# Patient Record
Sex: Female | Born: 1980 | Race: White | Hispanic: No | Marital: Married | Smoking: Never smoker
Health system: Southern US, Community
[De-identification: ages and names within clinical notes are randomized; demographics above are authoritative.]

## PROBLEM LIST (undated history)

## (undated) DIAGNOSIS — R1310 Dysphagia, unspecified: Secondary | ICD-10-CM

## (undated) DIAGNOSIS — I4510 Unspecified right bundle-branch block: Secondary | ICD-10-CM

## (undated) DIAGNOSIS — F988 Other specified behavioral and emotional disorders with onset usually occurring in childhood and adolescence: Secondary | ICD-10-CM

## (undated) DIAGNOSIS — J310 Chronic rhinitis: Secondary | ICD-10-CM

## (undated) DIAGNOSIS — E669 Obesity, unspecified: Secondary | ICD-10-CM

## (undated) DIAGNOSIS — Z9889 Other specified postprocedural states: Secondary | ICD-10-CM

## (undated) DIAGNOSIS — E7800 Pure hypercholesterolemia, unspecified: Secondary | ICD-10-CM

## (undated) DIAGNOSIS — R0602 Shortness of breath: Secondary | ICD-10-CM

## (undated) DIAGNOSIS — G35 Multiple sclerosis: Secondary | ICD-10-CM

## (undated) DIAGNOSIS — G43909 Migraine, unspecified, not intractable, without status migrainosus: Secondary | ICD-10-CM

## (undated) DIAGNOSIS — T7840XA Allergy, unspecified, initial encounter: Secondary | ICD-10-CM

## (undated) DIAGNOSIS — F329 Major depressive disorder, single episode, unspecified: Secondary | ICD-10-CM

## (undated) DIAGNOSIS — M4800 Spinal stenosis, site unspecified: Secondary | ICD-10-CM

## (undated) DIAGNOSIS — F419 Anxiety disorder, unspecified: Secondary | ICD-10-CM

## (undated) DIAGNOSIS — M21371 Foot drop, right foot: Secondary | ICD-10-CM

## (undated) DIAGNOSIS — G35D Multiple sclerosis, unspecified: Secondary | ICD-10-CM

## (undated) DIAGNOSIS — E559 Vitamin D deficiency, unspecified: Secondary | ICD-10-CM

## (undated) DIAGNOSIS — F411 Generalized anxiety disorder: Secondary | ICD-10-CM

## (undated) DIAGNOSIS — K8050 Calculus of bile duct without cholangitis or cholecystitis without obstruction: Secondary | ICD-10-CM

## (undated) DIAGNOSIS — M549 Dorsalgia, unspecified: Secondary | ICD-10-CM

## (undated) DIAGNOSIS — K219 Gastro-esophageal reflux disease without esophagitis: Secondary | ICD-10-CM

## (undated) DIAGNOSIS — R600 Localized edema: Secondary | ICD-10-CM

## (undated) DIAGNOSIS — F32A Depression, unspecified: Secondary | ICD-10-CM

## (undated) DIAGNOSIS — R112 Nausea with vomiting, unspecified: Secondary | ICD-10-CM

## (undated) DIAGNOSIS — G4700 Insomnia, unspecified: Secondary | ICD-10-CM

## (undated) HISTORY — DX: Migraine, unspecified, not intractable, without status migrainosus: G43.909

## (undated) HISTORY — PX: NO PAST SURGERIES: SHX2092

## (undated) HISTORY — DX: Localized edema: R60.0

## (undated) HISTORY — DX: Dysphagia, unspecified: R13.10

## (undated) HISTORY — DX: Spinal stenosis, site unspecified: M48.00

## (undated) HISTORY — DX: Shortness of breath: R06.02

## (undated) HISTORY — DX: Dorsalgia, unspecified: M54.9

## (undated) HISTORY — DX: Insomnia, unspecified: G47.00

## (undated) HISTORY — DX: Chronic rhinitis: J31.0

## (undated) HISTORY — PX: WISDOM TOOTH EXTRACTION: SHX21

## (undated) HISTORY — DX: Anxiety disorder, unspecified: F41.9

## (undated) HISTORY — DX: Allergy, unspecified, initial encounter: T78.40XA

## (undated) HISTORY — DX: Other specified behavioral and emotional disorders with onset usually occurring in childhood and adolescence: F98.8

## (undated) HISTORY — DX: Depression, unspecified: F32.A

## (undated) HISTORY — PX: CHOLECYSTECTOMY: SHX55

## (undated) HISTORY — DX: Generalized anxiety disorder: F41.1

## (undated) HISTORY — DX: Multiple sclerosis: G35

## (undated) HISTORY — DX: Major depressive disorder, single episode, unspecified: F32.9

---

## 2004-04-16 ENCOUNTER — Observation Stay: Payer: Self-pay | Admitting: Unknown Physician Specialty

## 2004-05-20 ENCOUNTER — Inpatient Hospital Stay: Payer: Self-pay | Admitting: Unknown Physician Specialty

## 2007-09-06 ENCOUNTER — Observation Stay: Payer: Self-pay | Admitting: Unknown Physician Specialty

## 2008-01-14 ENCOUNTER — Inpatient Hospital Stay: Payer: Self-pay

## 2009-08-04 ENCOUNTER — Inpatient Hospital Stay: Payer: Self-pay | Admitting: *Deleted

## 2009-08-13 ENCOUNTER — Ambulatory Visit: Payer: Self-pay | Admitting: Neurology

## 2013-07-23 LAB — CBC WITH DIFFERENTIAL/PLATELET
BASOS PCT: 0.6 %
Basophil #: 0 10*3/uL (ref 0.0–0.1)
EOS ABS: 0.2 10*3/uL (ref 0.0–0.7)
EOS PCT: 3.8 %
HCT: 42.3 % (ref 35.0–47.0)
HGB: 14.1 g/dL (ref 12.0–16.0)
Lymphocyte #: 2 10*3/uL (ref 1.0–3.6)
Lymphocyte %: 34.9 %
MCH: 29.5 pg (ref 26.0–34.0)
MCHC: 33.2 g/dL (ref 32.0–36.0)
MCV: 89 fL (ref 80–100)
Monocyte #: 0.6 x10 3/mm (ref 0.2–0.9)
Monocyte %: 9.9 %
Neutrophil #: 2.9 10*3/uL (ref 1.4–6.5)
Neutrophil %: 50.8 %
Platelet: 255 10*3/uL (ref 150–440)
RBC: 4.76 10*6/uL (ref 3.80–5.20)
RDW: 13.1 % (ref 11.5–14.5)
WBC: 5.8 10*3/uL (ref 3.6–11.0)

## 2013-07-23 LAB — COMPREHENSIVE METABOLIC PANEL
ALT: 22 U/L (ref 12–78)
AST: 15 U/L (ref 15–37)
Albumin: 4 g/dL (ref 3.4–5.0)
Alkaline Phosphatase: 55 U/L
Anion Gap: 6 — ABNORMAL LOW (ref 7–16)
BILIRUBIN TOTAL: 0.3 mg/dL (ref 0.2–1.0)
BUN: 15 mg/dL (ref 7–18)
CREATININE: 0.86 mg/dL (ref 0.60–1.30)
Calcium, Total: 8.5 mg/dL (ref 8.5–10.1)
Chloride: 105 mmol/L (ref 98–107)
Co2: 26 mmol/L (ref 21–32)
EGFR (African American): >60
EGFR (Non-African Amer.): >60
Glucose: 89 mg/dL (ref 65–99)
Osmolality: 274 (ref 275–301)
POTASSIUM: 3.5 mmol/L (ref 3.5–5.1)
SODIUM: 137 mmol/L (ref 136–145)
Total Protein: 7.7 g/dL (ref 6.4–8.2)

## 2013-07-23 LAB — LIPASE, BLOOD: LIPASE: 250 U/L (ref 73–393)

## 2013-07-23 LAB — MAGNESIUM: Magnesium: 2 mg/dL

## 2013-07-24 ENCOUNTER — Ambulatory Visit: Payer: Self-pay | Admitting: Neurology

## 2013-07-24 ENCOUNTER — Inpatient Hospital Stay: Payer: Self-pay | Admitting: Student

## 2013-07-24 LAB — HCG, QUANTITATIVE, PREGNANCY

## 2013-07-25 LAB — CBC WITH DIFFERENTIAL/PLATELET
BASOS ABS: 0 10*3/uL (ref 0.0–0.1)
Basophil %: 0.1 %
EOS ABS: 0 10*3/uL (ref 0.0–0.7)
EOS PCT: 0 %
HCT: 39.6 % (ref 35.0–47.0)
HGB: 13.5 g/dL (ref 12.0–16.0)
Lymphocyte #: 1.4 10*3/uL (ref 1.0–3.6)
Lymphocyte %: 15.9 %
MCH: 30 pg (ref 26.0–34.0)
MCHC: 34.1 g/dL (ref 32.0–36.0)
MCV: 88 fL (ref 80–100)
Monocyte #: 0.8 x10 3/mm (ref 0.2–0.9)
Monocyte %: 8.5 %
NEUTROS PCT: 75.5 %
Neutrophil #: 6.9 10*3/uL — ABNORMAL HIGH (ref 1.4–6.5)
PLATELETS: 256 10*3/uL (ref 150–440)
RBC: 4.5 10*6/uL (ref 3.80–5.20)
RDW: 13 % (ref 11.5–14.5)
WBC: 9.1 10*3/uL (ref 3.6–11.0)

## 2013-07-25 LAB — COMPREHENSIVE METABOLIC PANEL
ALBUMIN: 3.3 g/dL — AB (ref 3.4–5.0)
ALK PHOS: 49 U/L
ALT: 18 U/L (ref 12–78)
ANION GAP: 9 (ref 7–16)
AST: 20 U/L (ref 15–37)
BUN: 11 mg/dL (ref 7–18)
Bilirubin,Total: 0.4 mg/dL (ref 0.2–1.0)
CALCIUM: 8.5 mg/dL (ref 8.5–10.1)
Chloride: 108 mmol/L — ABNORMAL HIGH (ref 98–107)
Co2: 23 mmol/L (ref 21–32)
Creatinine: 0.72 mg/dL (ref 0.60–1.30)
EGFR (African American): >60
EGFR (Non-African Amer.): >60
Glucose: 105 mg/dL — ABNORMAL HIGH (ref 65–99)
Osmolality: 279 (ref 275–301)
Potassium: 4.2 mmol/L (ref 3.5–5.1)
SODIUM: 140 mmol/L (ref 136–145)
Total Protein: 6.7 g/dL (ref 6.4–8.2)

## 2013-07-25 LAB — CSF CELL CT + PROT + GLU PANEL
CSF TUBE #: 3
EOS PCT: 0 %
GLUCOSE, CSF: 65 mg/dL (ref 40–75)
Lymphocytes: 94 %
MONOCYTES/MACROPHAGES: 2 %
Neutrophils: 5 %
Other Cells: 0 %
Protein, CSF: 23 mg/dL (ref 15–45)
RBC (CSF): 0 /mm3
WBC (CSF): 12 /mm3

## 2013-07-28 LAB — BASIC METABOLIC PANEL
Anion Gap: 9 (ref 7–16)
BUN: 16 mg/dL (ref 7–18)
CALCIUM: 8.6 mg/dL (ref 8.5–10.1)
CHLORIDE: 103 mmol/L (ref 98–107)
CREATININE: 0.66 mg/dL (ref 0.60–1.30)
Co2: 29 mmol/L (ref 21–32)
EGFR (African American): >60
GLUCOSE: 121 mg/dL — AB (ref 65–99)
Osmolality: 284 (ref 275–301)
Potassium: 3.7 mmol/L (ref 3.5–5.1)
Sodium: 141 mmol/L (ref 136–145)

## 2013-07-28 LAB — CBC WITH DIFFERENTIAL/PLATELET
Basophil #: 0 10*3/uL (ref 0.0–0.1)
Basophil %: 0.1 %
EOS ABS: 0 10*3/uL (ref 0.0–0.7)
EOS PCT: 0 %
HCT: 40.1 % (ref 35.0–47.0)
HGB: 13.8 g/dL (ref 12.0–16.0)
LYMPHS PCT: 9.5 %
Lymphocyte #: 1 10*3/uL (ref 1.0–3.6)
MCH: 30.1 pg (ref 26.0–34.0)
MCHC: 34.3 g/dL (ref 32.0–36.0)
MCV: 88 fL (ref 80–100)
MONO ABS: 0.3 x10 3/mm (ref 0.2–0.9)
Monocyte %: 2.5 %
NEUTROS ABS: 9.3 10*3/uL — AB (ref 1.4–6.5)
NEUTROS PCT: 87.9 %
Platelet: 292 10*3/uL (ref 150–440)
RBC: 4.57 10*6/uL (ref 3.80–5.20)
RDW: 13.4 % (ref 11.5–14.5)
WBC: 10.6 10*3/uL (ref 3.6–11.0)

## 2014-05-28 NOTE — Consult Note (Signed)
PATIENT NAMEANDREAH, Ballard MR#:  [id number] DATE OF BIRTH:  March 31, 1980  DATE OF CONSULTATION:  07/24/2013  REFERRING PHYSICIAN:  Dr. Lenard Lance in the Emergency Room. CONSULTING PHYSICIAN:  Weston Settle, MD  REASON FOR CONSULTATION: Brain lesion.   HISTORY OF PRESENT ILLNESS: The patient is a 34 year old white female who claims a 1-2  day history of worsening bilateral lower extremity numbness, tingling, and weakness. She does admit to being out in the extreme heat for prolonged periods of time as well during this time, which may have exacerbated her condition. She does have some mild  bladder issues in terms of  incomplete emptying and some difficulty with feeling that she has to go. Back in 2011, she presented with bilateral  lower extremity numbness and tingling and was found to have a cervical spine lesion at C4-C5, and did see a neurologist who elected  to observe her and no further work-up was done.  An MRI of the brain was performed with and without contrast, which I reviewed personally. It indicates a right frontal subcortical lesion which does not enhance with contrast. On retrospect, this was present in 2011 as well; however, there is a new lesion now in the right cerebellar peduncle which does not enhance with contrast either, and is new versus 2011. No other lesions are identified in the brain including no other corpus callosal lesions.  MRI of the cervical spine, with and without contrast, was also performed. She did move a lot and the quality of films are very poor. I do see some remnants of the old lesion at C4-C5, and it appears to be in the left posterolateral location on axial imaging. It does not enhance with contrast and it is less visible versus 2011. I cannot comment on any other possible lesions there because of poor quality of the images. The MRI of the lumbosacral spine was also performed, which I reviewed. It indicates degenerative disk disease at L5-S1 and there is evidence  of bilateral severe neural foraminal stenosis at L4-L5 as well. The thoracic spine was not imaged. CBC and complete metabolic panel are normal. She has not had any recent illness, SOB, UTI,cough, runny nose, or fever.  PAST MEDICAL HISTORY: Environmental allergies.   PAST SURGICAL HISTORY: Negative.  MEDICATIONS AT HOME: Claritin 10 mg daily.   ALLERGIES: NO KNOWN DRUG ALLERGIES.   SOCIAL HISTORY: Denies smoking, alcohol, or illicit drugs.   FAMILY HISTORY: Positive for MS in her mother. She is on Avonex per patient.  REVIEW OF SYSTEMS: Some mild disturbance in her balance with some mild numbness and tingling intermittently in the left hand, which has been present since 2011. No optic neuritis. No diplopia. No facial numbness. No vertigo.   PHYSICAL EXAMINATION:  VITAL SIGNS: Blood pressure is 117/73, pulse of 88, temperature 98.3.  HEART: Regular rate and rhythm, S1, S2. No murmurs.  LUNGS: Clear to auscultation.  NECK: There are no carotid bruits.  NEUROLOGIC: She is awake and alert. Language is fluent. Comprehension, naming and repetition are intact. Pupils are equal and reactive. There is no afferent pupillary defect. Extraocular movements are intact. Face is symmetrical. Tongue is midline. Palate raises symmetrically. Strength testing shows 5/5 strength in the upper and lower extremities in all muscle groups. There is no Babinski sign present. There is no Hoffman sign present. There are no skin rashes present. Sensory testing reveals reduced pinprick sensation from the toes all the way to the umbilicus bilaterally in the front and back of  the trunk. Coordination with finger-to-nose and heel-to-shin is intact and symmetrical. Reflexes are +2 and symmetrical in the Achilles, patella, biceps, triceps, and brachial radialis bilaterally. Gait testing is normal including her tandem gait. There is a mildly positive Romberg sign present.   IMPRESSION AND PLAN: This patient presents with at least  2 lesions disseminated in space and time, which are very typical of what you would find in a demyelinating disease such as multiple sclerosis. I think she does fulfill the criteria. I think her current clinical presentation is suggestive of a T 10 sensory level and she may have a new lower thoracic spinal cord lesion that was not imaged. On the other hand, her symptoms could simply be exacerbations of her prior symptoms from her cervical spine lesion due to the recent heat wave, otherwise known as the [place] phenomenon. I do think that even though technically she fulfills the diagnostic criteria, that a lumbar puncture with CSF oligoclonal bands and IgG index may help [place] solidify the diagnosis most accurately early on. She has received a low dose of Solu-Medrol in the ER at 125 mg. I do recommend we continue that at 1000 mg per day for the next 2 days. I discussed briefly with her future  immunomodulatory  treatment of her multiple sclerosis, and we discussed the various options including injectables and oral medications and those discussions can continue through the outpatient process before she makes a final decision on her personal preference for modality of treatment.     ____________________________ Weston Settle, MD se:ts D: 07/24/2013 13:21:21 ET T: 07/24/2013 14:14:15 ET JOB#: [id number]  cc: Weston Settle, MD, <Dictator> Weston Settle MD ELECTRONICALLY SIGNED 07/24/2013 16:17

## 2014-05-28 NOTE — H&P (Signed)
PATIENT NAMEDOLCE, [name] MR#:  [id number] DATE OF BIRTH:  1980-11-16  DATE OF ADMISSION:  07/24/2013  REFERRING PHYSICIAN: Dr. Lenard Lance.   FAMILY PHYSICIAN: Dr. Terance Hart.   REASON FOR ADMISSION: Exacerbation of multiple sclerosis.   HISTORY OF PRESENT ILLNESS: The patient is a 34 year old female admitted here approximately 4 years ago with diagnosis of MS for which she received IV steroids. Has been well since then until recently when she had developed progressive numbness and weakness in her pelvis and legs bilaterally. Presents to Emergency Room where MRI showed progressive lesions consistent with worsening MS. She is now admitted for further evaluation.   PAST MEDICAL HISTORY:  1.  Multiple sclerosis.  2.  Environmental allergies.   MEDICATIONS: None.   ALLERGIES: NO KNOWN DRUG ALLERGIES.   SOCIAL HISTORY: Negative for alcohol or tobacco abuse.   FAMILY HISTORY: Negative for breast or colon cancer. Essentially unremarkable.  REVIEW OF SYSTEMS:  CONSTITUTIONAL: No fever or change in weight.  EYES: Some blurred vision. No glaucoma.  ENT: No tinnitus or hearing loss. No nasal discharge or bleeding. No difficulty swallowing.  RESPIRATORY: No cough or wheezing. Denies hemoptysis.  CARDIOVASCULAR: No chest pain or orthopnea. No palpitations or syncope.  GASTROINTESTINAL: No nausea, vomiting, or diarrhea. No abdominal pain.  GENITOURINARY: No dysuria or hematuria. No incontinence.  ENDOCRINE: No polyuria or polydipsia. No heat or cold intolerance.  HEMATOLOGIC: The patient denies anemia, easy bruising, or bleeding.  LYMPHATIC: No swollen glands.  MUSCULOSKELETAL: The patient denies pain in her neck, back, shoulders, knees, or hips. No gout.  NEUROLOGIC: Denies migraines or stroke. No seizures.  PSYCHIATRIC: The patient denies anxiety, insomnia or depression.   PHYSICAL EXAMINATION:  GENERAL: The patient is in no acute distress.  VITAL SIGNS: Remarkable for a blood pressure  of 126/61, with a heart rate in 95, respiratory rate of 18, temperature of 98.3, and saturations 98% on room air.  HEENT: Normocephalic, atraumatic. Pupils equal, round, and reactive to light and accommodation. Extraocular movements are intact. Sclerae are nonicteric. Conjunctivae are clear.  Oropharynx is clear.  NECK: Supple without JVD. No adenopathy or thyromegaly is noted.  LUNGS: Clear to auscultation and percussion without wheezes, rales, or rhonchi. No dullness. Respiratory effort is normal.  CARDIAC: Regular rate and rhythm. Normal S1, S2. No significant rubs, murmurs, or gallops. PMI is nondisplaced. Chest wall is nontender.  ABDOMEN: Soft and nontender with normoactive bowel sounds. No organomegaly or masses were appreciated. No hernias or bruits are noted.  EXTREMITIES: Without clubbing, cyanosis, or edema. Pulses are 2+ bilaterally.  NEUROLOGIC: Cranial nerves II-XII grossly intact. Deep tendon reflexes are symmetric. Motor exam revealed 4/5 strength in the lower extremities. Sensory exam nonfocal.  PSYCHIATRIC: Revealed a patient who is alert and oriented to person, place, and time. She is cooperative and uses good judgment.   LABORATORY DATA: Glucose is 89 with a BUN of 15, creatinine of 0.86 and a GFR of greater than 60. Lipase is 250. Magnesium 2.0. White count is 5.8 with a hemoglobin of 14.1. MRI of the brain revealed new lesions in the right brachium pontis, as well as some spinal cord lesions in her cervical spine.   ASSESSMENT:  1.  Progressive multiple sclerosis.  2.  Failure to thrive.  3.  Lower extremity weakness.  4.  Visual disturbance.  5.  Environmental allergies.   PLAN: The patient will be admitted the floor with high-dose IV steroids. We will follow her sugars while on steroids. We would  begin empiric antibiotics. We will consult neurology. We will obtain an MRI of the thoracic spine per neurology recommendations. Consult physical therapy and speech therapy. Care  management consult for discharge planning. Continue IV steroids per neurology. Follow up routine labs in the morning. Further treatment and evaluation will depend upon the patient's progress.   TOTAL TIME SPENT ON THIS PATIENT: 50 minutes.   ____________________________ Duane Lope Kristen Sheen, MD jds:ts D: 07/24/2013 14:26:17 ET T: 07/24/2013 14:55:55 ET JOB#: [id number]  cc: Duane Lope. Kristen Sheen, MD, <Dictator> Teena Irani. Terance Hart, MD JEFFREY Rodena Medin MD ELECTRONICALLY SIGNED 07/24/2013 18:23

## 2014-05-28 NOTE — Discharge Summary (Signed)
PATIENT NAMEVICTORIYA, Ballard MR#:  [id number] DATE OF BIRTH:  [birth year]  DATE OF ADMISSION:  07/24/2013 DATE OF DISCHARGE:  07/28/2013  CONSULTANTS: Dr. Weston Settle from neurology and speech therapy.   CHIEF COMPLAINT: Exacerbation of multiple sclerosis.   DISCHARGE DIAGNOSES:  1. Acute multiple sclerosis flare.  2. History of environmental allergies.   DISCHARGE MEDICATIONS: Claritin-D 12 hour 5/120 mg 1 tablet every 12 hours, prednisone 70 mg today and then taper by 10 mg until done in 7 days.   DIET: Regular.   ACTIVITY: As tolerated.   FOLLOWUP: Please follow with PCP today at 1 as previously scheduled. Please follow with Dr. Sherryll Burger, neurology, on Friday as scheduled.   DISPOSITION: Home.   SIGNIFICANT LABORATORIES AND IMAGING: Initial BUN of 15, creatinine of 0.86, white count of 5.8. CSF cultures showed 94 lymphocytes, 12 WBC, 65 glucose, and 23 protein. ACE within normal limits. ANA with reflex was negative. Multiple sclerosis profile with myelin basic protein from the CSF.  Blood serum showed CSF. IgG index elevated at 1.  MRI lumbar spine with and without contrast: L4-L5, there is mild broad based disk bulge. At L5-S1, there is grade 1 anterolisthesis of L5 on S1, severe bilateral foraminal stenosis. MRI brain with and without contrast on June 20th, findings consistent with multiple sclerosis.  New lesion in the right brachium pontis, spinal cord lesion on C4-C5 was also present in 2011.  C-spine evaluation is limited due to motion. MRI C-spine with and without contrast as above. MRI T-spine: Normal MRI of the thoracic spine.   HISTORY OF PRESENT ILLNESS AND HOSPITAL COURSE: For full details of H and P, please see the dictation on the day of admission by Dr. Judithann Sheen. Briefly, this is a pleasant 34 year old Caucasian female with a history of multiple sclerosis diagnosed about 4 years ago, for which she received IV steroids.  Has been well since then. Came in with progressive  weakness in pelvis and legs bilaterally. In ER, had MRI which showed progressive lesions consistent with worsening MS and, therefore, she was admitted to the hospitalist service. She was seen by neurology and was also started on high dose steroids. She, of note, had a lumbar puncture done. CSF fluid as discussed above. Per neurology, her history and lesions are consistent with multiple sclerosis.  Her lesions are at the right frontal, right cerebellar peduncle at C4-5 and C6-7. Also, per history, paresthesias. She also is suggestive of having a T10 lesion, which is not seen on the scan. She has completed 3 days of high-dose steroids and she will be discharged with a prednisone taper per my previous colleague's discussion with Dr. Sherryll Burger from neurology. Of note, patient has an appointment with the PCP later today and with Dr. Sherryll Burger on Friday. At this point, the patient has just mild numbness and paresthesia, mostly below the knees and mostly at the ankles.   PHYSICAL EXAMINATION: VITAL SIGNS:  On the day of discharge, temperature is 98.4, pulse rate is 89, respiratory rate 17, blood pressure 109/68, oxygen saturation 99%.  GENERAL: The patient is a well-developed female lying in bed, no obvious distress.  HEENT: Normocephalic, atraumatic. Moist mucous membranes.  NECK:  Supple.  CARDIOVASCULAR: S1, S2 regular. No significant murmurs.  LUNGS: Clear.  ABDOMEN: Soft, nontender.  EXTREMITIES: No pitting edema.   TOTAL TIME SPENT:  About 35 minutes.    ____________________________ Kristen Eaton, MD sa:dd D: 07/28/2013 11:22:00 ET T: 07/28/2013 20:53:31 ET JOB#: [id number]  cc: Kristen Jacques Navy,  MD, <Dictator> Dr. Bailey Mech [facility] MD ELECTRONICALLY SIGNED 08/03/2013 18:18

## 2014-09-01 DIAGNOSIS — G35 Multiple sclerosis: Secondary | ICD-10-CM | POA: Insufficient documentation

## 2014-09-21 ENCOUNTER — Ambulatory Visit: Payer: Self-pay | Admitting: Family Medicine

## 2015-04-04 ENCOUNTER — Other Ambulatory Visit: Payer: Self-pay | Admitting: Neurology

## 2015-04-04 DIAGNOSIS — G35 Multiple sclerosis: Secondary | ICD-10-CM

## 2015-04-24 ENCOUNTER — Ambulatory Visit: Payer: BLUE CROSS/BLUE SHIELD

## 2015-04-24 ENCOUNTER — Ambulatory Visit: Admission: RE | Admit: 2015-04-24 | Payer: BLUE CROSS/BLUE SHIELD | Source: Ambulatory Visit

## 2015-05-11 ENCOUNTER — Ambulatory Visit: Payer: BLUE CROSS/BLUE SHIELD

## 2015-05-11 ENCOUNTER — Ambulatory Visit: Admission: RE | Admit: 2015-05-11 | Payer: BLUE CROSS/BLUE SHIELD | Source: Ambulatory Visit

## 2015-05-25 ENCOUNTER — Ambulatory Visit (INDEPENDENT_AMBULATORY_CARE_PROVIDER_SITE_OTHER): Payer: BLUE CROSS/BLUE SHIELD | Admitting: Neurology

## 2015-05-25 ENCOUNTER — Encounter: Payer: Self-pay | Admitting: Neurology

## 2015-05-25 VITALS — BP 120/82 | HR 72 | Resp 14 | Ht 62.5 in | Wt 154.0 lb

## 2015-05-25 DIAGNOSIS — R5383 Other fatigue: Secondary | ICD-10-CM | POA: Insufficient documentation

## 2015-05-25 DIAGNOSIS — F418 Other specified anxiety disorders: Secondary | ICD-10-CM | POA: Insufficient documentation

## 2015-05-25 DIAGNOSIS — R269 Unspecified abnormalities of gait and mobility: Secondary | ICD-10-CM | POA: Diagnosis not present

## 2015-05-25 DIAGNOSIS — Z87898 Personal history of other specified conditions: Secondary | ICD-10-CM | POA: Insufficient documentation

## 2015-05-25 DIAGNOSIS — R350 Frequency of micturition: Secondary | ICD-10-CM

## 2015-05-25 DIAGNOSIS — Z79899 Other long term (current) drug therapy: Secondary | ICD-10-CM | POA: Insufficient documentation

## 2015-05-25 DIAGNOSIS — G35 Multiple sclerosis: Secondary | ICD-10-CM | POA: Diagnosis not present

## 2015-05-25 DIAGNOSIS — J302 Other seasonal allergic rhinitis: Secondary | ICD-10-CM | POA: Insufficient documentation

## 2015-05-25 DIAGNOSIS — G43909 Migraine, unspecified, not intractable, without status migrainosus: Secondary | ICD-10-CM | POA: Insufficient documentation

## 2015-05-25 DIAGNOSIS — Z8669 Personal history of other diseases of the nervous system and sense organs: Secondary | ICD-10-CM | POA: Insufficient documentation

## 2015-05-25 MED ORDER — OXYBUTYNIN CHLORIDE ER 10 MG PO TB24
10.0000 mg | ORAL_TABLET | Freq: Every day | ORAL | Status: DC
Start: 2015-05-25 — End: 2015-11-15

## 2015-05-25 MED ORDER — SERTRALINE HCL 50 MG PO TABS: 50.0000 mg | ORAL_TABLET | Freq: Every day | ORAL | Status: DC

## 2015-05-25 NOTE — Progress Notes (Signed)
[facility]  PATIENT: KARL KNARR DOB: [birth year]  REFERRING DOCTOR OR PCP:  Dr. Cristopher Peru  [facility]) SOURCE: Patient, a couple MRI reports and images on PACS. Lab reports, notes from Dr. Clelia Croft  _________________________________   HISTORICAL  CHIEF COMPLAINT:  Chief Complaint  Patient presents with  . Multiple Sclerosis    Manasi is here for eval of MS.  Sts. she was dx. 2 yrs. ago.  Presenting sx. was numbness from her waist down and pain in her legs.  Dx. confirmed with MRI and LP.  She saw Dr. Sherryll Burger and he started her on Tecfidera, which she  has tolerated well.  Over the last 4-5 mos, she c/o worsening focus/concentration, more forgetful, difficulty swallowing. MRI done in March shows new lesions.  Dr. Sherryll Burger wanted her to start Tysabri but sts. she is high JCV ab positive. She is  here for a 2nd opinion regarding    . Abnormal MRI    tx. options/fim    HISTORY OF PRESENT ILLNESS:  I had the pleasure of seeing your patient, Kalliopi Coupland, at [facility] for neurologic consultation regarding her Multiple sclerosis and new symptoms.       MS history: In late June or early July 2011, she had the onset of numbness that went up to her belly button. She presented to the emergency room. An MRI of the brain showed 1 periventricular focus with maybe a second subtle periventricular focus. An MRI of the cervical spine showed an enhancing lesion adjacent to C3-C4. He diagnosis of possible MS was made but the evidence was not enough to begin a disease modifying therapy. In 2015, she had the onset of similar symptoms and also had decreased ability to use the left hand. An MRI of the brain at that time showed several new lesions not present in 2011, including a focus in the right middle cerebellar peduncle additionally, she had a lumbar puncture consistent with MS. Because of her symptoms, MRI changes and lumbar puncture results, she was diagnosed with  clinical definite relapsing remitting MS and was started on Tecfidera. She has continued on Tecfidera. Over the past few months she has noted more difficulty with focus and concentration.     She also has had some change in vision more recently. Because of these newer symptoms, and MRI was repeated 04/27/2015. The MRI of the cervical spine looked showed no new lesions. though the MRI of the knee showed 2  lesions not present in 2015, one in the left juxtacortical white matter and one in the left deep white matter.    Gait/strength/sensation: She feels her gait is fine on a flat surface. She feels comfortable climbing stairs but feels unsteady when she is going downstairs. Sometimes she will feel a little off balance while walking. She denies any significant weakness. She denies numbness most of the time. However, when she exercises for longer periods of time she will note some numbness in her legs.  Bladder/bowel: She has had difficulty with urinary frequency and urgency. She has not had urinary incontinence. Bowel function is fine.  Vision: Over the past 6 months or so she has noted that it takes her longer to fix her vision and she sometimes will get some separation but not frank diplopia..  She denies vertigo.  Fatigue/sleep: She reports fatigue that is both physical and cognitive. She notes that she does not feel refreshed when she wakes up in the morning and that she will feel more  tired as the day goes on. She is able to exercise regularly. Her fatigues a lot worse when she is hot or in hot weather. She feels she falls asleep easily most nights. However, she will wake up several times most nights and often have trouble falling back asleep. Some of those times she wakes up is to use the bathroom but other times she just wakes up spontaneously.   Amantadine has not helped much.     Mood/cognition: She notes a little bit of depression and has some anxiety. She has some stress being a single mother with  3 kids. Youngest child is 7.  She has never been on any antidepressants. She also has noted difficulty with cognitive skills over the past couple of years. 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    I personally reviewed multiple MRIs. MRI of the cervical spine 08/13/2009 shows an enhancing focus posteriorly adjacent to C4-C5. MRI of the brain 08/05/2009 showed several T2/FLAIR hyperintense foci in the periventricular white matter. MRI of the brain 07/24/2013 showed multiple development of a right middle cerebellar peduncle lesion and additional periventricular foci. MRI of the brain 04/27/2015 REVIEW OF SYSTEMS: Constitutional: No fevers, chills, sweats, or change in appetite.  She has fatigue and insomnia. Eyes: No visual changes, double vision, eye pain Ear, nose and throat: No hearing loss, ear pain, nasal congestion, sore throat Cardiovascular: No chest pain, palpitations Respiratory: No shortness of breath at rest or with exertion.   No wheezes GastrointestinaI: No nausea, vomiting, diarrhea, abdominal pain, fecal incontinence Genitourinary: No dysuria, urinary retention.  She notes frequency and nocturia. Musculoskeletal: No neck pain, back pain Integumentary: No rash, pruritus, skin lesions Neurological: as above Psychiatric: as above Endocrine: No palpitations, diaphoresis, change in appetite, change in weigh or increased thirst Hematologic/Lymphatic: No anemia, purpura, petechiae. Allergic/Immunologic: No itchy/runny eyes, nasal congestion, recent allergic reactions, rashes  ALLERGIES: No Known Allergies  HOME MEDICATIONS:  Current outpatient prescriptions:  .  Amantadine HCl 100 MG tablet, , Disp: , Rfl: 0 .  baclofen (LIORESAL) 10 MG tablet, Take by mouth., Disp: , Rfl:  .  Cholecalciferol (VITAMIN D-1000 MAX ST) 1000 units tablet, Take by mouth., Disp: , Rfl:  .   ibuprofen (ADVIL,MOTRIN) 200 MG tablet, Take 200 mg by mouth every 6 (six) hours as needed., Disp: , Rfl:  .  TECFIDERA 240 MG CPDR, , Disp: , Rfl: 10 .  triamcinolone (NASACORT) 55 MCG/ACT AERO nasal inhaler, Place 2 sprays into the nose daily., Disp: , Rfl:   PAST MEDICAL HISTORY: History reviewed. No pertinent past medical history.  PAST SURGICAL HISTORY: History reviewed. No pertinent past surgical history.  FAMILY HISTORY: History reviewed. No pertinent family history.  SOCIAL HISTORY:  Social History   Social History  . Marital Status: Married    Spouse Name: N/A  . Number of Children: N/A  . Years of Education: N/A   Occupational History  . Not on file.   Social History Main Topics  . Smoking status: Never Smoker   . Smokeless tobacco: Not on file  . Alcohol Use: 0.0 oz/week    0 Standard drinks or equivalent per week     Comment: occasional/fim  . Drug Use: No  . Sexual Activity: Not on file   Other Topics Concern  . Not on file   Social History Narrative  . No narrative on file  PHYSICAL EXAM  Filed Vitals:   05/25/15 1507  BP: 120/82  Pulse: 72  Resp: 14  Height: 5' 2.5" (1.588 m)  Weight: 154 lb (69.854 kg)    Body mass index is 27.7 kg/(m^2).   General: The patient is well-developed and well-nourished and in no acute distress  Eyes:  Funduscopic exam shows normal optic discs and retinal vessels.  Neck: The neck is supple, no carotid bruits are noted.  The neck is nontender.  Cardiovascular: The heart has a regular rate and rhythm with a normal S1 and S2. There were no murmurs, gallops or rubs. Lungs are clear to auscultation.  Skin: Extremities are without significant edema.  Musculoskeletal:  Back is nontender  Neurologic Exam  Mental status: The patient is alert and oriented x 3 at the time of the examination. The patient has apparent normal recent and remote memory, with an apparently normal attention span and concentration  ability.   Speech is normal.  Cranial nerves: Extraocular movements are full. Pupils are equal, round, and reactive to light and accomodation.  Visual fields are full.  Facial symmetry is present. There is good facial sensation to soft touch bilaterally.Facial strength is normal.  Trapezius and sternocleidomastoid strength is normal. No dysarthria is noted.  The tongue is midline, and the patient has symmetric elevation of the soft palate. No obvious hearing deficits are noted.  Motor:  Muscle bulk is normal.   Tone is normal. Strength is  5 / 5 in all 4 extremities.   Sensory: Sensory testing is intact to pinprick, soft touch and vibration sensation in all 4 extremities.  Coordination: Cerebellar testing reveals good finger-nose-finger and heel-to-shin bilaterally.  Gait and station: Station is normal.   Gait is near normal. Tandem gait is wide. Romberg is negative.   Reflexes: Deep tendon reflexes are symmetric and increased in her legs with spread at the knees bilaterally.   Plantar responses are flexor.    DIAGNOSTIC DATA (LABS, IMAGING, TESTING) - I reviewed patient records, labs, notes, testing and imaging myself where available.  Lab Results  Component Value Date   WBC 10.6 07/28/2013   HGB 13.8 07/28/2013   HCT 40.1 07/28/2013   MCV 88 07/28/2013   PLT 292 07/28/2013      Component Value Date/Time   NA 141 07/28/2013 0410   K 3.7 07/28/2013 0410   CL 103 07/28/2013 0410   CO2 29 07/28/2013 0410   GLUCOSE 121* 07/28/2013 0410   BUN 16 07/28/2013 0410   CREATININE 0.66 07/28/2013 0410   CALCIUM 8.6 07/28/2013 0410   PROT 6.7 07/25/2013 0410   ALBUMIN 3.3* 07/25/2013 0410   AST 20 07/25/2013 0410   ALT 18 07/25/2013 0410   ALKPHOS 49 07/25/2013 0410   BILITOT 0.4 07/25/2013 0410   GFRNONAA >60 07/28/2013 0410   GFRAA >60 07/28/2013 0410       ASSESSMENT AND PLAN  Relapsing remitting multiple sclerosis (HCC) - Plan: Hepatitis B surface antibody, Hepatitis C  antibody, Quantiferon tb gold assay (blood), Comprehensive metabolic panel, CBC with Differential/Platelet, Hepatitis B surface antigen, Hepatitis B core antibody, total, Varicella zoster antibody, IgG  High risk medication use - Plan: Hepatitis B surface antibody, Hepatitis C antibody, Quantiferon tb gold assay (blood), Comprehensive metabolic panel, CBC with Differential/Platelet, Hepatitis B surface antigen, Hepatitis B core antibody, total, Varicella zoster antibody, IgG  Gait disturbance  Depression with anxiety  Other fatigue  Urinary frequency   In summary, Steffanie Mingle is a 35 year old woman with  multiple sclerosis who was diagnosed in 2015 but had a transverse myelitis exacerbation in 2011. She has been on Tecfidera since 2015 but has had some progression of symptoms over the past 6 months. Additionally, her latest MRI shows 2 small foci are present in 2015.    I discussed with her that she be having a suboptimal response to her current therapy, though he is not clearly failing the therapy. My recommendation would be that she consider a different medication. We spent time discussing Gilenya in ocrelizumab as to options that may offer higher efficacy. Because she is high titer JCV antibody positive, I don't think that Tysabri would be as good of an option. We also briefly discussed [place] and [place] but insurance generally is reserving those 2 medications for people who have failed two other medicines. I went ahead and did blood work in preparation for either of these choices and she will consider her options further over the next couple of days, call back with the results of the blood work we can find out her choice and try to get her scheduled for either her first infusion or for her first day observation.  She has some other symptoms including mood issues, insomnia, urinary frequency and fatigue.  I will place her on Oxybutynin XL and we also discussed a trial of an antidepressant.   If  fatigue and her cognitive fog do not improve on a different therapy, consider a stimulant  She will return to see me in 2 months or sooner to initiate one of the therapies. If you or her would like her to continue to be seen here, I'll be happy to do so.  Thank you for asking me to see Mrs. Nedra Hai for a neurologic consultation. Please let me know if I can be of further assistance with her or other patients in the future.   Avonte Sensabaugh A. Epimenio Foot, MD, PhD 05/25/2015, 3:13 PM Certified in Neurology, Clinical Neurophysiology, Sleep Medicine, Pain Medicine and Neuroimaging  [facility] [address] [place], [place] [address] [phone]

## 2015-05-26 LAB — CBC WITH DIFFERENTIAL/PLATELET
BASOS: 1 %
Basophils Absolute: 0 10*3/uL (ref 0.0–0.2)
EOS (ABSOLUTE): 0.1 10*3/uL (ref 0.0–0.4)
EOS: 2 %
HEMOGLOBIN: 14.1 g/dL (ref 11.1–15.9)
Hematocrit: 40.6 % (ref 34.0–46.6)
IMMATURE GRANS (ABS): 0 10*3/uL (ref 0.0–0.1)
Immature Granulocytes: 0 %
LYMPHS ABS: 1.5 10*3/uL (ref 0.7–3.1)
LYMPHS: 27 %
MCH: 29.7 pg (ref 26.6–33.0)
MCHC: 34.7 g/dL (ref 31.5–35.7)
MCV: 86 fL (ref 79–97)
MONOCYTES: 7 %
Monocytes Absolute: 0.4 10*3/uL (ref 0.1–0.9)
NEUTROS ABS: 3.6 10*3/uL (ref 1.4–7.0)
Neutrophils: 63 %
Platelets: 319 10*3/uL (ref 150–379)
RBC: 4.74 x10E6/uL (ref 3.77–5.28)
RDW: 12.8 % (ref 12.3–15.4)
WBC: 5.6 10*3/uL (ref 3.4–10.8)

## 2015-05-26 LAB — COMPREHENSIVE METABOLIC PANEL
ALBUMIN: 4.4 g/dL (ref 3.5–5.5)
ALT: 11 IU/L (ref 0–32)
AST: 10 IU/L (ref 0–40)
Albumin/Globulin Ratio: 1.6 (ref 1.2–2.2)
Alkaline Phosphatase: 63 IU/L (ref 39–117)
BILIRUBIN TOTAL: 0.4 mg/dL (ref 0.0–1.2)
BUN / CREAT RATIO: 16 (ref 9–23)
BUN: 12 mg/dL (ref 6–20)
CALCIUM: 9 mg/dL (ref 8.7–10.2)
CO2: 26 mmol/L (ref 18–29)
Chloride: 101 mmol/L (ref 96–106)
Creatinine, Ser: 0.76 mg/dL (ref 0.57–1.00)
GFR, EST AFRICAN AMERICAN: 117 mL/min/{1.73_m2} (ref >59)
GFR, EST NON AFRICAN AMERICAN: 101 mL/min/{1.73_m2} (ref >59)
GLUCOSE: 87 mg/dL (ref 65–99)
Globulin, Total: 2.7 g/dL (ref 1.5–4.5)
Potassium: 4.4 mmol/L (ref 3.5–5.2)
Sodium: 139 mmol/L (ref 134–144)
TOTAL PROTEIN: 7.1 g/dL (ref 6.0–8.5)

## 2015-05-26 LAB — HEPATITIS B SURFACE ANTIBODY,QUALITATIVE: Hep B Surface Ab, Qual: NONREACTIVE

## 2015-05-26 LAB — HEPATITIS C ANTIBODY

## 2015-05-26 LAB — VARICELLA ZOSTER ANTIBODY, IGG: VARICELLA: 898 {index} (ref >165)

## 2015-05-26 LAB — HEPATITIS B CORE ANTIBODY, TOTAL: Hep B Core Total Ab: NEGATIVE

## 2015-05-26 LAB — HEPATITIS B SURFACE ANTIGEN: HEP B S AG: NEGATIVE

## 2015-05-29 ENCOUNTER — Telehealth: Payer: Self-pay | Admitting: *Deleted

## 2015-05-29 LAB — QUANTIFERON IN TUBE
QFT TB AG MINUS NIL VALUE: <0 IU/mL
QUANTIFERON MITOGEN VALUE: >10 IU/mL
QUANTIFERON TB AG VALUE: 0.02 IU/mL
QUANTIFERON TB GOLD: NEGATIVE
Quantiferon Nil Value: 0.03 IU/mL

## 2015-05-29 LAB — QUANTIFERON TB GOLD ASSAY (BLOOD)

## 2015-05-29 NOTE — Telephone Encounter (Signed)
I have spoken with [name].  She has not had EKG for Gilenya.  She will come in this afternoon/fim

## 2015-05-29 NOTE — Telephone Encounter (Signed)
-----   Message from Asa Lente, MD sent at 05/29/2015  2:59 PM EDT ----- Labs are fine. She would like to go on Gilenya. She signed this form and we can send it in.   She will stop protect the dura.. Lets try to do the FDL in 2 or 3 weeks.

## 2015-06-02 ENCOUNTER — Encounter: Payer: Self-pay | Admitting: *Deleted

## 2015-06-02 ENCOUNTER — Telehealth: Payer: Self-pay | Admitting: *Deleted

## 2015-06-02 NOTE — Telephone Encounter (Signed)
I spoke with pt. 05-29-15 and per RAS, advised that labs are ok. EKG was done this week and is normal.  Gilenya srf has been faxed in and FDO scheduled/fim

## 2015-06-02 NOTE — Telephone Encounter (Signed)
-----   Message from Richard A Sater, MD sent at 05/29/2015  2:59 PM EDT ----- Labs are fine. She would like to go on Gilenya. She signed this form and we can send it in.   She will stop protect the dura.. Lets try to do the FDL in 2 or 3 weeks. 

## 2015-06-05 ENCOUNTER — Encounter: Payer: Self-pay | Admitting: Neurology

## 2015-06-05 ENCOUNTER — Encounter: Payer: Self-pay | Admitting: *Deleted

## 2015-06-06 ENCOUNTER — Encounter: Payer: Self-pay | Admitting: *Deleted

## 2015-06-08 ENCOUNTER — Encounter: Payer: Self-pay | Admitting: *Deleted

## 2015-06-14 ENCOUNTER — Telehealth: Payer: Self-pay | Admitting: Neurology

## 2015-06-14 NOTE — Telephone Encounter (Signed)
Patient requesting refill of Fingolimod HCl 0.5 MG [facility]: [facility] [phone]

## 2015-06-15 NOTE — Telephone Encounter (Signed)
I have spoken with [name] and explained that Gilenya rx. will be sent in once FDO is complete.  She verbalized understanding of same/fim

## 2015-06-19 ENCOUNTER — Ambulatory Visit (INDEPENDENT_AMBULATORY_CARE_PROVIDER_SITE_OTHER): Payer: BLUE CROSS/BLUE SHIELD | Admitting: Neurology

## 2015-06-19 VITALS — BP 110/76 | HR 68 | Resp 14 | Ht 62.5 in | Wt 154.0 lb

## 2015-06-19 DIAGNOSIS — R5383 Other fatigue: Secondary | ICD-10-CM | POA: Diagnosis not present

## 2015-06-19 DIAGNOSIS — G35 Multiple sclerosis: Secondary | ICD-10-CM | POA: Diagnosis not present

## 2015-06-19 DIAGNOSIS — F418 Other specified anxiety disorders: Secondary | ICD-10-CM

## 2015-06-19 DIAGNOSIS — R269 Unspecified abnormalities of gait and mobility: Secondary | ICD-10-CM

## 2015-06-19 DIAGNOSIS — G43009 Migraine without aura, not intractable, without status migrainosus: Secondary | ICD-10-CM | POA: Diagnosis not present

## 2015-06-19 DIAGNOSIS — Z79899 Other long term (current) drug therapy: Secondary | ICD-10-CM

## 2015-06-19 MED ORDER — AMANTADINE HCL 100 MG PO TABS: 100.0000 mg | ORAL_TABLET | Freq: Two times a day (BID) | ORAL | Status: DC

## 2015-06-20 ENCOUNTER — Encounter: Payer: Self-pay | Admitting: Neurology

## 2015-06-20 NOTE — Progress Notes (Signed)
[facility]  PATIENT: [name] DOB: 1980/03/14  REFERRING DOCTOR OR PCP:  Dr. Cristopher Peru  [facility]) SOURCE: Patient, a couple MRI reports and images on PACS. Lab reports, notes from Dr. Clelia Croft  _________________________________   HISTORICAL  CHIEF COMPLAINT:  Chief Complaint  Patient presents with  . Gilenya FDO    0815--EKG done and read by RAS.  Verbal ok to proceed with fdo received.  0845-Gilenya 0.5mg  po given.  110/76-68-14.  1610-960/45-40-98-[id number] t.v. 9562-130/86-57-84.  6962-952/84-13-24.  pt. watching t.v--suspensful movie.  4010-272/53-66-44. 1115-=104/68-80-14. 1145-108/70-84-16. watching t.v. 1215-112/70-86-14. 1245-108/68-80-12-eating lunch. 1315-108/72-84-14. 1345-114/68-80-14. 1415-108/70-72-12. 1445-112/66-76-12.  Pt. tolerated fdo well.  EKG done and ready by RAS, and verbal permission to d/c  . High Risk Medication    pt. home received.  Pt. instructed to take Gilenya in the am, at least for the next 2 weeks, with the rationale being that heartrate is higher during the day. Pt. verbalized understanidng of same.  I have advised [employer]--Gilenya Go--that pt. has completed FDO/fim    HISTORY OF PRESENT ILLNESS:  [name] is a 35 year old woman with relapsing remitting multiple sclerosis. The last visit, we decided to start Gilenya and she will have her first day observation. We discussed the pros and cons of Gilenya as a disease modifying therapy  A baseline EKG was performed on 8:30 AM and showed normal sinus rhythm with normal intervals and no ischemic changes. She took the 0.5 mg dose of Gilenya. For the next 6 hours she had multiple signs performed. The heart rate never dropped below 60 and blood pressure was stable. She was evaluated on multiple occasions. She denied palpitations, chest pain, shortness of breath, lightheadedness, headaches or other symptoms. After 6 hours, a follow-up EKG was performed and also showed normal sinus  rhythm with normal intervals and no ischemic changes.       Gait/strength/sensation: Gait is occasionally off balance but she feels she does well on flat surfaces. She notes no unsteady going down hill or down stairs. She denies any significant weakness. She denies numbness most of the time. When she exercises for longer periods of time she will note some numbness in her legs.  Bladder/bowel: She denies difficulty with urinary frequency and urgency. She has not had urinary incontinence. Bowel function is fine.  Vision: She denies significant visual changes. She denies diplopia..  She denies vertigo.  Fatigue/sleep: She reports fatigue that is both physical and cognitive.  Fatigue is present when she wakes up but will usually worsen as the day goes on.  Despite fatigue, , she is able to exercise regularly. Her fatigues is worse in hot weather. She feels she falls asleep easily most nights. However, she will wake up several times most nights and often have trouble falling back asleep. Some of those times she wakes up is to use the bathroom but other times she just wakes up spontaneously.   Amantadine has helped fatigue a little bit. She needs refill.     Mood/cognition: She notes a mild depression and some anxiety. She has  stress being a single mother with 3 kids.  Sertraline was added at her last visit and she feels it has helped some. She also has noted difficulty with cognitive skills over the past couple of years. 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  Other: She has migraine headaches. These are generally stable. She will take  ibuprofen when one occurs.  MS history:   In late June or early July 2011, she had the onset of numbness that went up to her belly button. She presented to the emergency room. An MRI of the brain showed 1 periventricular focus with maybe a second subtle periventricular focus.  An MRI of the cervical spine showed an enhancing lesion adjacent to C3-C4. The diagnosis of possible MS was made but the evidence was not enough to begin a disease modifying therapy. In 2015, she had the onset of similar symptoms and also had decreased ability to use the left hand. An MRI of the brain at that time showed several new lesions not present in 2011, including a focus in the right middle cerebellar peduncle additionally, she had a lumbar puncture consistent with MS. Because of her symptoms, MRI changes and lumbar puncture results, she was diagnosed with clinical definite relapsing remitting MS and was started on Tecfidera. She has continued on Tecfidera. Over the past few months she has noted more difficulty with focus and concentration.     She also has had some change in vision more recently. Because of these newer symptoms, and MRI was repeated 04/27/2015. The MRI of the cervical spine looked no new lesions. though the MRI of the brain showed 2  lesions not present in 2015, one in the left juxtacortical white matter and one in the left deep white matter.      REVIEW OF SYSTEMS: Constitutional: No fevers, chills, sweats, or change in appetite.  She has fatigue and insomnia. Eyes: No visual changes, double vision, eye pain Ear, nose and throat: No hearing loss, ear pain, nasal congestion, sore throat Cardiovascular: No chest pain, palpitations Respiratory: No shortness of breath at rest or with exertion.   No wheezes GastrointestinaI: No nausea, vomiting, diarrhea, abdominal pain, fecal incontinence Genitourinary: No dysuria, urinary retention.  She notes frequency and nocturia. Musculoskeletal: No neck pain, back pain Integumentary: No rash, pruritus, skin lesions Neurological: as above Psychiatric: as above Endocrine: No palpitations, diaphoresis, change in appetite, change in weigh or increased thirst Hematologic/Lymphatic: No anemia, purpura, petechiae. Allergic/Immunologic: No  itchy/runny eyes, nasal congestion, recent allergic reactions, rashes  ALLERGIES: No Known Allergies  HOME MEDICATIONS:  Current outpatient prescriptions:  .  Amantadine HCl 100 MG tablet, Take 1 tablet (100 mg total) by mouth 2 (two) times daily., Disp: 60 tablet, Rfl: 11 .  baclofen (LIORESAL) 10 MG tablet, Take by mouth., Disp: , Rfl:  .  Cholecalciferol (VITAMIN D-1000 MAX ST) 1000 units tablet, Take by mouth., Disp: , Rfl:  .  Fingolimod HCl 0.5 MG CAPS, Take 0.5 mg by mouth daily., Disp: , Rfl:  .  ibuprofen (ADVIL,MOTRIN) 200 MG tablet, Take 200 mg by mouth every 6 (six) hours as needed., Disp: , Rfl:  .  oxybutynin (DITROPAN XL) 10 MG 24 hr tablet, Take 1 tablet (10 mg total) by mouth at bedtime., Disp: 30 tablet, Rfl: 5 .  sertraline (ZOLOFT) 50 MG tablet, Take 1 tablet (50 mg total) by mouth daily., Disp: 30 tablet, Rfl: 3 .  triamcinolone (NASACORT) 55 MCG/ACT AERO nasal inhaler, Place 2 sprays into the nose daily., Disp: , Rfl:   PAST MEDICAL HISTORY: History reviewed. No pertinent past medical history.  PAST SURGICAL HISTORY: History reviewed. No pertinent past surgical history.  FAMILY HISTORY: History reviewed. No pertinent family history.  SOCIAL HISTORY:  Social History   Social History  . Marital Status: Married    Spouse Name: N/A  .  Number of Children: N/A  . Years of Education: N/A   Occupational History  . Not on file.   Social History Main Topics  . Smoking status: Never Smoker   . Smokeless tobacco: Not on file  . Alcohol Use: 0.0 oz/week    0 Standard drinks or equivalent per week     Comment: occasional/fim  . Drug Use: No  . Sexual Activity: Not on file   Other Topics Concern  . Not on file   Social History Narrative     PHYSICAL EXAM  Filed Vitals:   06/20/15 1602  BP: 110/76  Pulse: 68  Resp: 14  Height: 5' 2.5" (1.588 m)  Weight: 154 lb (69.854 kg)    Body mass index is 27.7 kg/(m^2).   General: The patient is  well-developed and well-nourished and in no acute distress   Cardiovascular: The heart has a regular rate and rhythm with a normal S1 and S2. There were no murmurs, gallops or rubs.   Neurologic Exam  Mental status: The patient is alert and oriented x 3 at the time of the examination. The patient has apparent normal recent and remote memory, with an apparently normal attention span and concentration ability.   Speech is normal.  Cranial nerves: Extraocular movements are full.  There is good facial sensation to soft touch bilaterally.Facial strength is normal.  Trapezius and sternocleidomastoid strength is normal. No dysarthria is noted.  The tongue is midline, and the patient has symmetric elevation of the soft palate. No obvious hearing deficits are noted.  Motor:  Muscle bulk is normal.   Tone is normal. Strength is  5 / 5 in all 4 extremities.   Sensory: Sensory testing is intact to pinprick, soft touch and vibration sensation in all 4 extremities.  Coordination: Cerebellar testing reveals good finger-nose-finger and heel-to-shin bilaterally.  Gait and station: Station is normal.   Gait is near normal. Tandem gait is wide. Romberg is negative.   Reflexes: Deep tendon reflexes are symmetric and increased in her legs with spread at the knees bilaterally.   Plantar responses are flexor.    DIAGNOSTIC DATA (LABS, IMAGING, TESTING) - I reviewed patient records, labs, notes, testing and imaging myself where available.  Lab Results  Component Value Date   WBC 5.6 05/25/2015   HGB 13.8 07/28/2013   HCT 40.6 05/25/2015   MCV 86 05/25/2015   PLT 319 05/25/2015      Component Value Date/Time   NA 139 05/25/2015 1632   NA 141 07/28/2013 0410   K 4.4 05/25/2015 1632   K 3.7 07/28/2013 0410   CL 101 05/25/2015 1632   CL 103 07/28/2013 0410   CO2 26 05/25/2015 1632   CO2 29 07/28/2013 0410   GLUCOSE 87 05/25/2015 1632   GLUCOSE 121* 07/28/2013 0410   BUN 12 05/25/2015 1632   BUN 16  07/28/2013 0410   CREATININE 0.76 05/25/2015 1632   CREATININE 0.66 07/28/2013 0410   CALCIUM 9.0 05/25/2015 1632   CALCIUM 8.6 07/28/2013 0410   PROT 7.1 05/25/2015 1632   PROT 6.7 07/25/2013 0410   ALBUMIN 4.4 05/25/2015 1632   ALBUMIN 3.3* 07/25/2013 0410   AST 10 05/25/2015 1632   AST 20 07/25/2013 0410   ALT 11 05/25/2015 1632   ALT 18 07/25/2013 0410   ALKPHOS 63 05/25/2015 1632   ALKPHOS 49 07/25/2013 0410   BILITOT 0.4 05/25/2015 1632   BILITOT 0.4 07/25/2013 0410   GFRNONAA 101 05/25/2015 1632   GFRNONAA >  60 07/28/2013 0410   GFRAA 117 05/25/2015 1632   GFRAA >60 07/28/2013 0410       ASSESSMENT AND PLAN  Relapsing remitting multiple sclerosis (HCC)  Gait disturbance  High risk medication use  Depression with anxiety  Other fatigue  Migraine without aura and without status migrainosus, not intractable   1.   Gilenya 0.5 mg today with first a observation. An EKG was performed before the first dose and was normal. She was monitored for 6 additional hours. Vital signs were reviewed multiple times during this interval. She never had any bradycardia and never had any arrhythmias. At 6 hours, she had a second EKG which also showed normal sinus rhythm with a rate of 72.    She is advised to take the Gilenya at the same time of day for the next couple weeks and then she can change to whatever time is more convenient for her.    2.    Continue other medications for her MS symptoms. 3.   Return to clinic in 3 months or sooner if there are new or worsening neurologic symptoms.  45 minute face-to-face evaluation with greater than one half at time counseling and coordinating care about initiation of Gilenya therapy.  Richard A. Epimenio Foot, MD, PhD 06/20/2015, 5:21 PM Certified in Neurology, Clinical Neurophysiology, Sleep Medicine, Pain Medicine and Neuroimaging  [facility] [address] [place], [place] [address] [phone]

## 2015-06-21 NOTE — Telephone Encounter (Signed)
I have spoken with [facility] and confirmed that they have Gilenya rx. that was faxed to them from [facility].  They do not need anything further from our office/fim

## 2015-06-21 NOTE — Telephone Encounter (Signed)
[facility] [phone]  (f) [phone] called requesting verbal order to fill Gilenya RX.

## 2015-07-26 ENCOUNTER — Ambulatory Visit: Payer: BLUE CROSS/BLUE SHIELD | Admitting: Neurology

## 2015-08-16 ENCOUNTER — Telehealth: Payer: Self-pay | Admitting: Neurology

## 2015-08-16 NOTE — Telephone Encounter (Signed)
Pt sts she is having a hard time waking up in the mornings since starting oxybutynin (DITROPAN XL) 10 MG 24 hr tablet . Please call

## 2015-08-16 NOTE — Telephone Encounter (Signed)
I have spoken with Caylen this afternoon.  She c/o increased difficulty getting up in the mornings.  Sts. is compliant with Amantadine.  Will check with RAS for other options/fim

## 2015-08-17 NOTE — Telephone Encounter (Signed)
LMTC.  Per RAS, can d/c Amantadine and try Adderall XR  po qd for fatigue/fim

## 2015-08-21 MED ORDER — AMPHETAMINE-DEXTROAMPHET ER 20 MG PO CP24: 20.0000 mg | ORAL_CAPSULE | Freq: Every day | ORAL | Status: DC

## 2015-08-21 NOTE — Telephone Encounter (Signed)
Patient is returning your call in regard to Adderall. Please call [phone].

## 2015-08-21 NOTE — Addendum Note (Signed)
Addended by: Candis Schatz I on: 08/21/2015 04:23 PM   Modules accepted: Orders

## 2015-08-21 NOTE — Telephone Encounter (Signed)
LMTC./fim 

## 2015-08-21 NOTE — Telephone Encounter (Signed)
I have spoken with [facility] and advised that per RAS, she can d/c Amantadine and try Adderall XR 20mg  po qd.  She is agreeable to this, will pick rx. up tomorrow.  Rx. up front [facility]/fim

## 2015-08-23 ENCOUNTER — Ambulatory Visit: Payer: BLUE CROSS/BLUE SHIELD | Admitting: Neurology

## 2015-09-01 ENCOUNTER — Telehealth: Payer: Self-pay | Admitting: Neurology

## 2015-09-01 ENCOUNTER — Ambulatory Visit (INDEPENDENT_AMBULATORY_CARE_PROVIDER_SITE_OTHER): Payer: BLUE CROSS/BLUE SHIELD | Admitting: Neurology

## 2015-09-01 ENCOUNTER — Encounter: Payer: Self-pay | Admitting: Neurology

## 2015-09-01 VITALS — BP 140/86 | Resp 16 | Ht 63.0 in | Wt 154.0 lb

## 2015-09-01 DIAGNOSIS — Z8669 Personal history of other diseases of the nervous system and sense organs: Secondary | ICD-10-CM

## 2015-09-01 DIAGNOSIS — F909 Attention-deficit hyperactivity disorder, unspecified type: Secondary | ICD-10-CM

## 2015-09-01 DIAGNOSIS — G35 Multiple sclerosis: Secondary | ICD-10-CM | POA: Diagnosis not present

## 2015-09-01 DIAGNOSIS — R5383 Other fatigue: Secondary | ICD-10-CM | POA: Diagnosis not present

## 2015-09-01 DIAGNOSIS — R350 Frequency of micturition: Secondary | ICD-10-CM | POA: Diagnosis not present

## 2015-09-01 DIAGNOSIS — R269 Unspecified abnormalities of gait and mobility: Secondary | ICD-10-CM

## 2015-09-01 DIAGNOSIS — Z79899 Other long term (current) drug therapy: Secondary | ICD-10-CM | POA: Diagnosis not present

## 2015-09-01 DIAGNOSIS — F988 Other specified behavioral and emotional disorders with onset usually occurring in childhood and adolescence: Secondary | ICD-10-CM | POA: Insufficient documentation

## 2015-09-01 NOTE — Telephone Encounter (Signed)
I have spoken with [name] this morning.  She has mult. complaints--more frequent h/a's, dry mouth, difficulty with bladder--unable to tell when bladder is empty.  Appt. given with RAS this am/fim

## 2015-09-01 NOTE — Telephone Encounter (Signed)
Patient called, having symptoms of what might be the beginning of MS flare, will be leaving next Wednesday for [place]. Please call.

## 2015-09-01 NOTE — Progress Notes (Signed)
[facility]  PATIENT: [name] DOB: [birth year]  REFERRING DOCTOR OR PCP:  Dr. Cristopher Peru  [facility]) SOURCE: Patient, a couple MRI reports and images on PACS. Lab reports, notes from Dr. Clelia Croft  _________________________________   HISTORICAL  CHIEF COMPLAINT:  Chief Complaint  Patient presents with  . Multiple Sclerosis    Sts. she is tolerating Gilenya well.  Today she has new complaint--sts. when she voids or has a bowel movement, she still feels as if she has to use the bathroom.  She also c/o increased thirst, dry mouth, increased fatigue. She just started Adderall XR 20mg  one week ago, thinks this is helping some/fim  . Headaches    Also sts. she is having more frequent h/a's., and has been dragging her right leg for the last 3 days/fim  . Right Leg Weakness    HISTORY OF PRESENT ILLNESS:  [name] is a 35 year old woman with relapsing remitting multiple sclerosis. Earlier this year, we started Gilenya and she is tolerating it well.   She notes several new symptoms including changes in bladder/bowel, increased thirst and increased fatigue.   Symptoms have started the last couple weeks  Numbness:  She notes numbness in her groin and notes intercourse feels differently.     She feels there is more of a numbness in the groin area and lower abdomen and left toes but has more pain in her thighs.    Gait:   She is tripping more due to her right leg feeling mildly weaker and/or clumsy.   She feels she drags her toe on that side when walking, especially with a step or curb.      Bladder:   She feels with her bladder, she does not know when she is finished ans sometimes feels like she still needs to go .   Similar sensation with bowel and there is frequency.     She denies urinary hesitancy  HA:   Sh notes headaches around the head (occiput to over eyes.   There is always mild pain in the occiput but has more intermittent pain in the temple region.  Vision:  She denies significant visual changes. She denies diplopia..  She denies vertigo.  Fatigue/sleep: She reports fatigue that is both physical and cognitive.  Fatigue is present when she wakes up but will usually worsen as the day goes on. She is trying to exercise some but has scaled back since she felt worse a few hours later.    Adderall has helped only a little bit but she tolerates it well.  Her fatigues is worse in hot weather.  She falls asleep easily and wakes up some but better since starting oxybutynon  Mood/cognition: She notes a mild depression and some anxiety. She has  stress being a single mother with 3 kids.  Sertraline was added at her last visit and she feels it has helped some. She also has noted difficulty with cognitive skills over the past couple of years. 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  Other: She has migraine headaches. These are generally stable. She will take ibuprofen when one occurs.  MS history:   In late June or early July 2011, she had the onset of numbness that went up to her belly button. She presented to the emergency room. An MRI of the brain showed 1 periventricular focus with maybe a second  subtle periventricular focus. An MRI of the cervical spine showed an enhancing lesion adjacent to C3-C4. The diagnosis of possible MS was made but the evidence was not enough to begin a disease modifying therapy. In 2015, she had the onset of similar symptoms and also had decreased ability to use the left hand. An MRI of the brain at that time showed several new lesions not present in 2011, including a focus in the right middle cerebellar peduncle additionally, she had a lumbar puncture consistent with MS. Because of her symptoms, MRI changes and lumbar puncture results, she was diagnosed with clinical definite relapsing remitting MS and was started on Tecfidera. She has continued  on Tecfidera. Over the past few months she has noted more difficulty with focus and concentration.     She also has had some change in vision more recently. Because of these newer symptoms, and MRI was repeated 04/27/2015. The MRI of the cervical spine looked no new lesions. though the MRI of the brain showed 2  lesions not present in 2015, one in the left juxtacortical white matter and one in the left deep white matter.      REVIEW OF SYSTEMS: Constitutional: No fevers, chills, sweats, or change in appetite.  She has fatigue and insomnia. Eyes: No visual changes, double vision, eye pain Ear, nose and throat: No hearing loss, ear pain, nasal congestion, sore throat Cardiovascular: No chest pain, palpitations Respiratory: No shortness of breath at rest or with exertion.   No wheezes GastrointestinaI: No nausea, vomiting, diarrhea, abdominal pain, fecal incontinence Genitourinary:   She notes frequency, some hesitancy at times and nocturia. Musculoskeletal: No neck pain, back pain Integumentary: No rash, pruritus, skin lesions Neurological: as above Psychiatric: as above Endocrine: No palpitations, diaphoresis, change in appetite, change in weigh or increased thirst Hematologic/Lymphatic: No anemia, purpura, petechiae. Allergic/Immunologic: No itchy/runny eyes, nasal congestion, recent allergic reactions, rashes  ALLERGIES: No Known Allergies  HOME MEDICATIONS:  Current Outpatient Prescriptions:  .  amphetamine-dextroamphetamine (ADDERALL XR) 20 MG 24 hr capsule, Take 1 capsule (20 mg total) by mouth daily., Disp: 30 capsule, Rfl: 0 .  baclofen (LIORESAL) 10 MG tablet, Take by mouth., Disp: , Rfl:  .  Cholecalciferol (VITAMIN D-1000 MAX ST) 1000 units tablet, Take by mouth., Disp: , Rfl:  .  Fingolimod HCl 0.5 MG CAPS, Take 0.5 mg by mouth daily., Disp: , Rfl:  .  ibuprofen (ADVIL,MOTRIN) 200 MG tablet, Take 200 mg by mouth every 6 (six) hours as needed., Disp: , Rfl:  .  oxybutynin  (DITROPAN XL) 10 MG 24 hr tablet, Take 1 tablet (10 mg total) by mouth at bedtime., Disp: 30 tablet, Rfl: 5 .  sertraline (ZOLOFT) 50 MG tablet, Take 1 tablet (50 mg total) by mouth daily., Disp: 30 tablet, Rfl: 3 .  triamcinolone (NASACORT) 55 MCG/ACT AERO nasal inhaler, Place 2 sprays into the nose daily., Disp: , Rfl:   PAST MEDICAL HISTORY: No past medical history on file.  PAST SURGICAL HISTORY: No past surgical history on file.  FAMILY HISTORY: No family history on file.  SOCIAL HISTORY:  Social History   Social History  . Marital status: Divorced    Spouse name: N/A  . Number of children: N/A  . Years of education: N/A   Occupational History  . Not on file.   Social History Main Topics  . Smoking status: Never Smoker  . Smokeless tobacco: Not on file  . Alcohol use 0.0 oz/week     Comment: occasional/fim  .  Drug use: No  . Sexual activity: Not on file   Other Topics Concern  . Not on file   Social History Narrative  . No narrative on file     PHYSICAL EXAM  Vitals:   09/01/15 1047  BP: 140/86  Resp: 16  Weight: 154 lb (69.9 kg)  Height: 5\' 3"  (1.6 m)    Body mass index is 27.28 kg/m.   General: The patient is well-developed and well-nourished and in no acute distress   Skin:  No rashes or edema  Musculoskeletal:    Mild occipital tenderness/uppe neck tenderness.   Good ROM  Neurologic Exam  Mental status: The patient is alert and oriented x 3 at the time of the examination. The patient has apparent normal recent and remote memory, with an apparently normal attention span and concentration ability.   Speech is normal.  Cranial nerves: Extraocular movements are full.  There is good facial sensation to soft touch bilaterally.Facial strength is normal.  Trapezius and sternocleidomastoid strength is normal. No dysarthria is noted.  The tongue is midline, and the patient has symmetric elevation of the soft palate. No obvious hearing deficits are  noted.  Motor:  Muscle bulk is normal.   Tone is normal. Strength is  5 / 5 in all 4 extremities.   Sensory: Sensory testing is intact to pinprick, soft touch and vibration sensation in arms.  She notes altered right leg sensation and lower abdomen bilateral to temperature.  Coordination: Cerebellar testing reveals good finger-nose-finger and heel-to-shin bilaterally.  Gait and station: Station is normal.   Gait is near normal . Tandem gait is wide. Romberg is negative.   Reflexes: Deep tendon reflexes are symmetric and increased in her legs with spread at the knees bilaterally.   Plantar responses are flexor.    DIAGNOSTIC DATA (LABS, IMAGING, TESTING) - I reviewed patient records, labs, notes, testing and imaging myself where available.  Lab Results  Component Value Date   WBC 5.6 05/25/2015   HGB 13.8 07/28/2013   HCT 40.6 05/25/2015   MCV 86 05/25/2015   PLT 319 05/25/2015      Component Value Date/Time   NA 139 05/25/2015 1632   NA 141 07/28/2013 0410   K 4.4 05/25/2015 1632   K 3.7 07/28/2013 0410   CL 101 05/25/2015 1632   CL 103 07/28/2013 0410   CO2 26 05/25/2015 1632   CO2 29 07/28/2013 0410   GLUCOSE 87 05/25/2015 1632   GLUCOSE 121 (H) 07/28/2013 0410   BUN 12 05/25/2015 1632   BUN 16 07/28/2013 0410   CREATININE 0.76 05/25/2015 1632   CREATININE 0.66 07/28/2013 0410   CALCIUM 9.0 05/25/2015 1632   CALCIUM 8.6 07/28/2013 0410   PROT 7.1 05/25/2015 1632   PROT 6.7 07/25/2013 0410   ALBUMIN 4.4 05/25/2015 1632   ALBUMIN 3.3 (L) 07/25/2013 0410   AST 10 05/25/2015 1632   AST 20 07/25/2013 0410   ALT 11 05/25/2015 1632   ALT 18 07/25/2013 0410   ALKPHOS 63 05/25/2015 1632   ALKPHOS 49 07/25/2013 0410   BILITOT 0.4 05/25/2015 1632   BILITOT 0.4 07/25/2013 0410   GFRNONAA 101 05/25/2015 1632   GFRNONAA >60 07/28/2013 0410   GFRAA 117 05/25/2015 1632   GFRAA >60 07/28/2013 0410       ASSESSMENT AND PLAN  Relapsing remitting multiple sclerosis  (HCC) - Plan: Urinalysis, Routine w reflex microscopic (not at [facility]), Urine culture, CBC with Differential/Platelets, Comprehensive metabolic panel  High  risk medication use - Plan: CBC with Differential/Platelets, Comprehensive metabolic panel  Gait disturbance  Other fatigue  Urinary frequency - Plan: Urinalysis, Routine w reflex microscopic (not at [facility]), Urine culture  History of migraine headaches  Attention deficit disorder  1.   Gilenya 0.5 mg daily for MS.   We will check CBC with differential, hepatic function test to rule out severe for hepatotoxicity.   She appears to be having a mild exacerbation and I will have her get site measured today and Monday. 2.    Continue other medications for her MS symptoms. 3.    She has more bladder symptoms. These could be related to an MS exacerbation though might be due to a urinary tract infection and we will check a UA and urine culture today.  4.   Adderall for attention deficit and fatigue associated with MS.  Return to clinic in 3-4 months or sooner if there are new or worsening neurologic symptoms.  45 minute face-to-face evaluation with greater than one half at time counseling and coordinating care about initiation of Gilenya therapy.  Edilberto Roosevelt A. Epimenio Foot, MD, PhD 09/01/2015, 12:26 PM Certified in Neurology, Clinical Neurophysiology, Sleep Medicine, Pain Medicine and Neuroimaging  [facility] [address], [address] [place], [place] [address] [phone]

## 2015-09-02 LAB — CBC WITH DIFFERENTIAL/PLATELET
Basophils Absolute: 0 10*3/uL (ref 0.0–0.2)
Basos: 0 %
EOS (ABSOLUTE): 0.1 10*3/uL (ref 0.0–0.4)
Eos: 2 %
Hematocrit: 41.4 % (ref 34.0–46.6)
Hemoglobin: 14.9 g/dL (ref 11.1–15.9)
IMMATURE GRANULOCYTES: 0 %
Immature Grans (Abs): 0 10*3/uL (ref 0.0–0.1)
LYMPHS ABS: 0.4 10*3/uL — AB (ref 0.7–3.1)
Lymphs: 11 %
MCH: 30.5 pg (ref 26.6–33.0)
MCHC: 36 g/dL — AB (ref 31.5–35.7)
MCV: 85 fL (ref 79–97)
Monocytes Absolute: 0.3 10*3/uL (ref 0.1–0.9)
Monocytes: 7 %
NEUTROS PCT: 80 %
Neutrophils Absolute: 3.2 10*3/uL (ref 1.4–7.0)
PLATELETS: 325 10*3/uL (ref 150–379)
RBC: 4.89 x10E6/uL (ref 3.77–5.28)
RDW: 14.2 % (ref 12.3–15.4)
WBC: 4 10*3/uL (ref 3.4–10.8)

## 2015-09-02 LAB — COMPREHENSIVE METABOLIC PANEL
ALBUMIN: 4.7 g/dL (ref 3.5–5.5)
ALK PHOS: 83 IU/L (ref 39–117)
ALT: 27 IU/L (ref 0–32)
AST: 18 IU/L (ref 0–40)
Albumin/Globulin Ratio: 1.7 (ref 1.2–2.2)
BUN / CREAT RATIO: 16 (ref 9–23)
BUN: 12 mg/dL (ref 6–20)
Bilirubin Total: 0.7 mg/dL (ref 0.0–1.2)
CALCIUM: 9.4 mg/dL (ref 8.7–10.2)
CO2: 21 mmol/L (ref 18–29)
CREATININE: 0.74 mg/dL (ref 0.57–1.00)
Chloride: 99 mmol/L (ref 96–106)
GFR calc Af Amer: 120 mL/min/{1.73_m2} (ref >59)
GFR calc non Af Amer: 104 mL/min/{1.73_m2} (ref >59)
GLUCOSE: 84 mg/dL (ref 65–99)
Globulin, Total: 2.8 g/dL (ref 1.5–4.5)
Potassium: 4.4 mmol/L (ref 3.5–5.2)
Sodium: 138 mmol/L (ref 134–144)
Total Protein: 7.5 g/dL (ref 6.0–8.5)

## 2015-09-02 LAB — MICROSCOPIC EXAMINATION
CASTS: NONE SEEN /LPF
Epithelial Cells (non renal): >10 /hpf — AB (ref 0–10)

## 2015-09-02 LAB — URINALYSIS, ROUTINE W REFLEX MICROSCOPIC
Bilirubin, UA: NEGATIVE
Glucose, UA: NEGATIVE
Ketones, UA: NEGATIVE
Nitrite, UA: NEGATIVE
PH UA: 7 (ref 5.0–7.5)
Protein, UA: NEGATIVE
Specific Gravity, UA: 1.016 (ref 1.005–1.030)
UUROB: 0.2 mg/dL (ref 0.2–1.0)

## 2015-09-02 LAB — URINE CULTURE

## 2015-09-13 ENCOUNTER — Ambulatory Visit: Payer: BLUE CROSS/BLUE SHIELD | Admitting: Neurology

## 2015-09-17 ENCOUNTER — Other Ambulatory Visit: Payer: Self-pay | Admitting: Neurology

## 2015-09-21 ENCOUNTER — Telehealth: Payer: Self-pay | Admitting: Neurology

## 2015-09-21 MED ORDER — AMPHETAMINE-DEXTROAMPHET ER 20 MG PO CP24: 20.0000 mg | ORAL_CAPSULE | Freq: Every day | ORAL | 0 refills | Status: DC

## 2015-09-21 NOTE — Telephone Encounter (Signed)
Rx. awaiting RAS sig/fim 

## 2015-09-21 NOTE — Addendum Note (Signed)
Addended by: Candis Schatz I on: 09/21/2015 10:45 AM   Modules accepted: Orders

## 2015-09-21 NOTE — Telephone Encounter (Signed)
Patient requesting refill of amphetamine-dextroamphetamine (ADDERALL XR) 20 MG 24 hr capsule °Pharmacy pick up ° °

## 2015-09-21 NOTE — Telephone Encounter (Signed)
Adderall rx. up front [facility]/fim 

## 2015-10-02 ENCOUNTER — Telehealth: Payer: Self-pay | Admitting: Neurology

## 2015-10-02 DIAGNOSIS — F329 Major depressive disorder, single episode, unspecified: Secondary | ICD-10-CM

## 2015-10-02 DIAGNOSIS — G35 Multiple sclerosis: Secondary | ICD-10-CM

## 2015-10-02 DIAGNOSIS — F32A Depression, unspecified: Secondary | ICD-10-CM

## 2015-10-02 NOTE — Telephone Encounter (Signed)
Pt called in requesting a referral to Dr. Sherlean FootBrian Wall at [facility]. Phone: [phone] , fax: [phone] Reason: she and her daughter are having some issues and wants some help for the both of them. She wants to be seen with her daughter.  May call pt at: [phone]

## 2015-10-02 NOTE — Telephone Encounter (Signed)
LMOM (identified vm) that per RAS, ok for behavioral health referral.  Referral to Dr. Sherlean Foot ordered in [facility] as requested/fim

## 2015-10-16 ENCOUNTER — Telehealth: Payer: Self-pay | Admitting: Neurology

## 2015-10-16 MED ORDER — AMPHETAMINE-DEXTROAMPHET ER 20 MG PO CP24: 20.0000 mg | ORAL_CAPSULE | Freq: Every day | ORAL | 0 refills | Status: DC

## 2015-10-16 NOTE — Telephone Encounter (Signed)
RAS sig/fim 

## 2015-10-16 NOTE — Telephone Encounter (Signed)
Adderall rx. up front [facility]/fim 

## 2015-10-16 NOTE — Telephone Encounter (Signed)
Patient requesting refill of amphetamine-dextroamphetamine (ADDERALL XR) 20 MG 24 hr capsule °Pharmacy pick up ° °

## 2015-10-25 ENCOUNTER — Telehealth: Payer: Self-pay | Admitting: Neurology

## 2015-10-25 NOTE — Telephone Encounter (Signed)
Pt called in wanting to know if she is allow to take Augmentin with Gelinya. Please call

## 2015-10-25 NOTE — Telephone Encounter (Signed)
I have spoken with [name] this afternoon and advised that it is safe to take Augmentin with Gilenya/fim

## 2015-10-25 NOTE — Telephone Encounter (Signed)
Kristen MerlinBrook Ballard, good friend, called asking on behalf of the pt.  [facility] physician would like to know about taking augmentin with gelinya. Please call [phone], Debbe BalesBrook states pt has laryngitis and can not speak. I have told her , Kristen, we will not speak with her she is not on a dpr. Pt currently waiting at the pharmacy

## 2015-11-15 ENCOUNTER — Other Ambulatory Visit: Payer: Self-pay | Admitting: Neurology

## 2015-11-17 ENCOUNTER — Telehealth: Payer: Self-pay | Admitting: *Deleted

## 2015-11-17 MED ORDER — AMPHETAMINE-DEXTROAMPHET ER 20 MG PO CP24: 20.0000 mg | ORAL_CAPSULE | Freq: Every day | ORAL | 0 refills | Status: DC

## 2015-11-17 NOTE — Telephone Encounter (Signed)
Pt. presented to the office with request for Adderall rx.  Rx. given directly to pt/fim

## 2015-12-25 ENCOUNTER — Telehealth: Payer: Self-pay | Admitting: Neurology

## 2015-12-25 MED ORDER — AMPHETAMINE-DEXTROAMPHET ER 20 MG PO CP24: 20.0000 mg | ORAL_CAPSULE | Freq: Every day | ORAL | 0 refills | Status: DC

## 2015-12-25 NOTE — Telephone Encounter (Signed)
Rx. awaiting RAS sig.  I spoke with Taytem and explained rx. will be ready by 9am tomorrow/fim

## 2015-12-25 NOTE — Telephone Encounter (Signed)
Patient called to request refill of amphetamine-dextroamphetamine (ADDERALL XR) 20 MG 24 hr capsule °

## 2015-12-26 NOTE — Telephone Encounter (Signed)
Adderall rx. up front [facility]/fim 

## 2016-01-03 ENCOUNTER — Ambulatory Visit (INDEPENDENT_AMBULATORY_CARE_PROVIDER_SITE_OTHER): Payer: BLUE CROSS/BLUE SHIELD | Admitting: Neurology

## 2016-01-03 ENCOUNTER — Encounter: Payer: Self-pay | Admitting: Neurology

## 2016-01-03 VITALS — BP 114/78 | HR 80 | Resp 16 | Ht 63.0 in | Wt 162.5 lb

## 2016-01-03 DIAGNOSIS — R269 Unspecified abnormalities of gait and mobility: Secondary | ICD-10-CM | POA: Diagnosis not present

## 2016-01-03 DIAGNOSIS — F988 Other specified behavioral and emotional disorders with onset usually occurring in childhood and adolescence: Secondary | ICD-10-CM | POA: Diagnosis not present

## 2016-01-03 DIAGNOSIS — Z79899 Other long term (current) drug therapy: Secondary | ICD-10-CM

## 2016-01-03 DIAGNOSIS — R42 Dizziness and giddiness: Secondary | ICD-10-CM

## 2016-01-03 DIAGNOSIS — R5383 Other fatigue: Secondary | ICD-10-CM | POA: Diagnosis not present

## 2016-01-03 DIAGNOSIS — F418 Other specified anxiety disorders: Secondary | ICD-10-CM | POA: Diagnosis not present

## 2016-01-03 DIAGNOSIS — R350 Frequency of micturition: Secondary | ICD-10-CM | POA: Diagnosis not present

## 2016-01-03 DIAGNOSIS — G35 Multiple sclerosis: Secondary | ICD-10-CM

## 2016-01-03 MED ORDER — AMPHETAMINE-DEXTROAMPHET ER 20 MG PO CP24: 20.0000 mg | ORAL_CAPSULE | Freq: Every day | ORAL | 0 refills | Status: DC

## 2016-01-03 MED ORDER — CYCLOBENZAPRINE HCL 5 MG PO TABS: 5.0000 mg | ORAL_TABLET | Freq: Every day | ORAL | 5 refills | Status: DC

## 2016-01-03 MED ORDER — AMPHETAMINE-DEXTROAMPHETAMINE 10 MG PO TABS: ORAL_TABLET | ORAL | 0 refills | Status: DC

## 2016-01-03 NOTE — Progress Notes (Signed)
[facility]  PATIENT: [name] DOB: 03-25-1980  REFERRING DOCTOR OR PCP:  Dr. Cristopher Peru  [facility]) SOURCE: Patient, a couple MRI reports and images on PACS. Lab reports, notes from Dr. Clelia Croft  _________________________________   HISTORICAL  CHIEF COMPLAINT:  Chief Complaint  Patient presents with  . Multiple Sclerosis    Sts. she continues to tolerate Gilenya well.  Sts. she has had intermittent dizziness and increased difficulty with balance onset 12-29-15.  She was seen at Urgent Care and given Meclizine which she thnks is helping, but she feels foggy. Sts. depression has been worse, so she saw a psychiatrist--Dr. Sherlean Foot.  Sts. he increased Zoloft to 100mg  daily, and added an extra Adderall 10mg  to take between 1-3pm.  She continues Adderall XR 20mg  once in the am.  Sts. she has had frequent nosebleeds this week   . Depression     cause.  Sts. she related it to dry air, but is using humidifier and it hasn't  helped so far.  Also has had sinus congestion/fim  . Dizziness    HISTORY OF PRESENT ILLNESS:  [name] is a 35 year old woman with relapsing remitting multiple sclerosis.   MS:   Early 2017, she started Gilenya and she is tolerating it well.   She notes several new symptoms including changes in bladder/bowel, increased thirst and increased fatigue.   Symptoms have started the last couple weeks  Numbness:  She notes that the numbness in her groin is better but now she notes right > left foot/toe numbness, Her legs are sore but no shooting pain.  Gait:  Gait is about the same.    She is tripping due to her right leg feeling mildly weaker and/or clumsy.   She notes right foot drags some while walking, especially with a step or curb.      Vertigo:   She has had vertigo off/on since Friday.    She gets 30-60 seconds of vertigo with some movements like getting out of bed, rolling over or bending.   She was veering more to the right.    She feels  this is better now than last Friday but she is also being more careful.   Her PCP is prescribing meclizine.     Bladder:   She has urgency and frequency with 2-3 nocturia.      Vision: She denies significant visual changes. She denies diplopia..  She denies vertigo.  Fatigue/sleep: She has both physical and cognitive fatigue.  Fatigue is present when she wakes up but will usually worsen as the day goes on. She is trying to exercise some but has scaled back since she felt worse a few hours later.    Adderall has helped fatigue.   Psychiatrist recently added an afternoon IR dose.   She falls asleep easily and wakes but wakes up more at night (better since starting oxybutynin)  Mood/cognition: She notes more depression and some anxiety. Her Zoloft dose was increased Judie Grieve Wall).   She also has noted difficulty with cognitive skills over the past couple of years. 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   She has not noted any improvement with Adderall.  Other: She has migraine headaches. These are generally stable. She will take ibuprofen when one occurs.  Her mom has MS.     MS history:   In late June or  early July 2011, she had the onset of numbness that went up to her belly button. She presented to the emergency room. An MRI of the brain showed 1 periventricular focus with maybe a second subtle periventricular focus. An MRI of the cervical spine showed an enhancing lesion adjacent to C3-C4. The diagnosis of possible MS was made but the evidence was not enough to begin a disease modifying therapy. In 2015, she had the onset of similar symptoms and also had decreased ability to use the left hand. An MRI of the brain at that time showed several new lesions not present in 2011, including a focus in the right middle cerebellar peduncle additionally, she had a lumbar puncture consistent with MS. Because of her  symptoms, MRI changes and lumbar puncture results, she was diagnosed with clinical definite relapsing remitting MS and was started on Tecfidera. She has continued on Tecfidera. Over the past few months she has noted more difficulty with focus and concentration.     She also has had some change in vision more recently. Because of these newer symptoms, and MRI was repeated 04/27/2015. The MRI of the cervical spine looked no new lesions. though the MRI of the brain showed 2  lesions not present in 2015, one in the left juxtacortical white matter and one in the left deep white matter.     She had a small exacerbation in August and received IV SOlu-Medrol    REVIEW OF SYSTEMS: Constitutional: No fevers, chills, sweats, or change in appetite.  She has fatigue and insomnia. Eyes: No visual changes, double vision, eye pain Ear, nose and throat: No hearing loss, ear pain, nasal congestion, sore throat Cardiovascular: No chest pain, palpitations Respiratory: No shortness of breath at rest or with exertion.   No wheezes GastrointestinaI: No nausea, vomiting, diarrhea, abdominal pain, fecal incontinence Genitourinary:   She notes frequency, some hesitancy at times and nocturia. Musculoskeletal: No neck pain, back pain Integumentary: No rash, pruritus, skin lesions Neurological: as above Psychiatric: as above Endocrine: No palpitations, diaphoresis, change in appetite, change in weigh or increased thirst Hematologic/Lymphatic: No anemia, purpura, petechiae. Allergic/Immunologic: No itchy/runny eyes, nasal congestion, recent allergic reactions, rashes  ALLERGIES: No Known Allergies  HOME MEDICATIONS:  Current Outpatient Prescriptions:  .  amphetamine-dextroamphetamine (ADDERALL XR) 20 MG 24 hr capsule, Take 1 capsule (20 mg total) by mouth daily., Disp: 30 capsule, Rfl: 0 .  baclofen (LIORESAL) 10 MG tablet, Take by mouth., Disp: , Rfl:  .  Cholecalciferol (VITAMIN D-1000 MAX ST) 1000 units tablet,  Take by mouth., Disp: , Rfl:  .  Fingolimod HCl 0.5 MG CAPS, Take 0.5 mg by mouth daily., Disp: , Rfl:  .  ibuprofen (ADVIL,MOTRIN) 200 MG tablet, Take 200 mg by mouth every 6 (six) hours as needed., Disp: , Rfl:  .  oxybutynin (DITROPAN-XL) 10 MG 24 hr tablet, TAKE 1 TABLET (10 MG TOTAL) BY MOUTH AT BEDTIME., Disp: 30 tablet, Rfl: 5 .  triamcinolone (NASACORT) 55 MCG/ACT AERO nasal inhaler, Place 2 sprays into the nose daily., Disp: , Rfl:  .  amphetamine-dextroamphetamine (ADDERALL) 10 MG tablet, Take by mouth., Disp: , Rfl:  .  meclizine (ANTIVERT) 25 MG tablet, , Disp: , Rfl: 0 .  sertraline (ZOLOFT) 100 MG tablet, , Disp: , Rfl: 0  PAST MEDICAL HISTORY: No past medical history on file.  PAST SURGICAL HISTORY: No past surgical history on file.  FAMILY HISTORY: No family history on file.  SOCIAL HISTORY:  Social History   Social  History  . Marital status: Divorced    Spouse name: N/A  . Number of children: N/A  . Years of education: N/A   Occupational History  . Not on file.   Social History Main Topics  . Smoking status: Never Smoker  . Smokeless tobacco: Not on file  . Alcohol use 0.0 oz/week     Comment: occasional/fim  . Drug use: No  . Sexual activity: Not on file   Other Topics Concern  . Not on file   Social History Narrative  . No narrative on file     PHYSICAL EXAM  Vitals:   01/03/16 1042  BP: 114/78  Pulse: 80  Resp: 16  Weight: 162 lb 8 oz (73.7 kg)  Height: 5\' 3"  (1.6 m)    Body mass index is 28.79 kg/m.   General: The patient is well-developed and well-nourished and in no acute distress   Skin:  No rashes or edema  Musculoskeletal:    Mild occipital tenderness/uppe neck tenderness.   Good ROM  Neurologic Exam  Mental status: The patient is alert and oriented x 3 at the time of the examination. The patient has apparent normal recent and remote memory, with an apparently normal attention span and concentration ability.   Speech is  normal.  Cranial nerves: Extraocular movements are full.  There is good facial sensation to soft touch bilaterally.Facial strength is normal.  Trapezius and sternocleidomastoid strength is normal. No dysarthria is noted.  The tongue is midline, and the patient has symmetric elevation of the soft palate. No obvious hearing deficits are noted.  Motor:  Muscle bulk is normal.   Tone is normal. Strength is  5 / 5 in all 4 extremities.   Sensory: Sensory testing is intact to pinprick, soft touch and vibration sensation in arms.  She notes altered right leg sensation and lower abdomen bilateral to temperature.  Coordination: Cerebellar testing reveals good finger-nose-finger and heel-to-shin bilaterally.  Gait and station: Station is normal.   Gait is near normal . Tandem gait is wide. Romberg is negative.   Reflexes: Deep tendon reflexes are symmetric and increased in her legs with spread at the knees bilaterally.   Plantar responses are flexor.  Other:Barany maneuver evoked vertigo without nystagmus to the left.    DIAGNOSTIC DATA (LABS, IMAGING, TESTING) - I reviewed patient records, labs, notes, testing and imaging myself where available.  Lab Results  Component Value Date   WBC 4.0 09/01/2015   HGB 13.8 07/28/2013   HCT 41.4 09/01/2015   MCV 85 09/01/2015   PLT 325 09/01/2015      Component Value Date/Time   NA 138 09/01/2015 1128   NA 141 07/28/2013 0410   K 4.4 09/01/2015 1128   K 3.7 07/28/2013 0410   CL 99 09/01/2015 1128   CL 103 07/28/2013 0410   CO2 21 09/01/2015 1128   CO2 29 07/28/2013 0410   GLUCOSE 84 09/01/2015 1128   GLUCOSE 121 (H) 07/28/2013 0410   BUN 12 09/01/2015 1128   BUN 16 07/28/2013 0410   CREATININE 0.74 09/01/2015 1128   CREATININE 0.66 07/28/2013 0410   CALCIUM 9.4 09/01/2015 1128   CALCIUM 8.6 07/28/2013 0410   PROT 7.5 09/01/2015 1128   PROT 6.7 07/25/2013 0410   ALBUMIN 4.7 09/01/2015 1128   ALBUMIN 3.3 (L) 07/25/2013 0410   AST 18  09/01/2015 1128   AST 20 07/25/2013 0410   ALT 27 09/01/2015 1128   ALT 18 07/25/2013 0410  ALKPHOS 83 09/01/2015 1128   ALKPHOS 49 07/25/2013 0410   BILITOT 0.7 09/01/2015 1128   BILITOT 0.4 07/25/2013 0410   GFRNONAA 104 09/01/2015 1128   GFRNONAA >60 07/28/2013 0410   GFRAA 120 09/01/2015 1128   GFRAA >60 07/28/2013 0410       ASSESSMENT AND PLAN  Relapsing remitting multiple sclerosis (HCC)  Depression with anxiety  Gait disturbance  High risk medication use  Other fatigue  Urinary frequency  Attention deficit disorder, unspecified hyperactivity presence   1.   Gilenya 0.5 mg daily for MS.   we need to check an MRI of the brain to determine if there is any subclinical progression. Her last MRI did show new lesions and if additional changes are noted on this MRI we will need to consider a different disease modifying therapy, such as ocrelizumab or Lemtrada. We briefly discussed both options but we'll discuss them in more detail if we need to make a change. 2.    Continue other medications for her MS symptoms. 3.    Adderall for attention deficit and fatigue associated with MS. Renew 4.   Brandt-Daroff exercises for BPPV.    Return to clinic in 3-4 months or sooner if there are new or worsening neurologic symptoms.  45 minute face-to-face evaluation with greater than one half at time counseling and coordinating care about initiation of Gilenya therapy.  Richard A. Epimenio Foot, MD, PhD 01/03/2016, 10:49 AM Certified in Neurology, Clinical Neurophysiology, Sleep Medicine, Pain Medicine and Neuroimaging  [facility] [address] [place], [place] [address] [phone]

## 2016-01-19 ENCOUNTER — Other Ambulatory Visit: Payer: Self-pay | Admitting: Neurology

## 2016-01-24 ENCOUNTER — Ambulatory Visit
Admission: RE | Admit: 2016-01-24 | Discharge: 2016-01-24 | Disposition: A | Discharge status: Home / Self Care | Payer: BLUE CROSS/BLUE SHIELD | Source: Ambulatory Visit | Attending: Neurology | Admitting: Neurology

## 2016-01-24 DIAGNOSIS — R269 Unspecified abnormalities of gait and mobility: Secondary | ICD-10-CM | POA: Diagnosis not present

## 2016-01-24 DIAGNOSIS — R42 Dizziness and giddiness: Secondary | ICD-10-CM | POA: Diagnosis not present

## 2016-01-24 DIAGNOSIS — G35 Multiple sclerosis: Secondary | ICD-10-CM | POA: Diagnosis not present

## 2016-01-24 MED ORDER — GADOBENATE DIMEGLUMINE 529 MG/ML IV SOLN
15.0000 mL | Freq: Once | INTRAVENOUS | Status: AC | PRN
  Administered 2016-01-24: 15 mL via INTRAVENOUS

## 2016-01-25 ENCOUNTER — Encounter: Payer: Self-pay | Admitting: Neurology

## 2016-01-31 ENCOUNTER — Telehealth: Payer: Self-pay | Admitting: *Deleted

## 2016-01-31 NOTE — Telephone Encounter (Signed)
Per Dr Epimenio Foot, spoke with patient and informed her that her MRI of the brain is unchanged. She verbalized understanding, appreciation.

## 2016-02-26 ENCOUNTER — Encounter: Payer: Self-pay | Admitting: Neurology

## 2016-02-27 ENCOUNTER — Other Ambulatory Visit: Payer: Self-pay | Admitting: Neurology

## 2016-02-27 ENCOUNTER — Encounter: Payer: Self-pay | Admitting: Neurology

## 2016-02-27 ENCOUNTER — Telehealth: Payer: Self-pay | Admitting: *Deleted

## 2016-02-27 MED ORDER — AMPHETAMINE-DEXTROAMPHETAMINE 10 MG PO TABS: ORAL_TABLET | ORAL | 0 refills | Status: DC

## 2016-02-27 MED ORDER — AMPHETAMINE-DEXTROAMPHET ER 20 MG PO CP24: 20.0000 mg | ORAL_CAPSULE | Freq: Every day | ORAL | 0 refills | Status: DC

## 2016-02-27 NOTE — Telephone Encounter (Signed)
Adderall rx. up front [facility], per emailed request/fim 

## 2016-03-28 ENCOUNTER — Ambulatory Visit (INDEPENDENT_AMBULATORY_CARE_PROVIDER_SITE_OTHER): Payer: BLUE CROSS/BLUE SHIELD | Admitting: Neurology

## 2016-03-28 ENCOUNTER — Encounter: Payer: Self-pay | Admitting: Neurology

## 2016-03-28 ENCOUNTER — Telehealth: Payer: Self-pay | Admitting: Neurology

## 2016-03-28 VITALS — BP 112/71 | HR 112 | Resp 16 | Ht 63.0 in | Wt 169.5 lb

## 2016-03-28 DIAGNOSIS — M21371 Foot drop, right foot: Secondary | ICD-10-CM | POA: Insufficient documentation

## 2016-03-28 DIAGNOSIS — R350 Frequency of micturition: Secondary | ICD-10-CM | POA: Diagnosis not present

## 2016-03-28 DIAGNOSIS — F418 Other specified anxiety disorders: Secondary | ICD-10-CM | POA: Diagnosis not present

## 2016-03-28 DIAGNOSIS — G35 Multiple sclerosis: Secondary | ICD-10-CM

## 2016-03-28 DIAGNOSIS — R5383 Other fatigue: Secondary | ICD-10-CM | POA: Diagnosis not present

## 2016-03-28 DIAGNOSIS — R269 Unspecified abnormalities of gait and mobility: Secondary | ICD-10-CM

## 2016-03-28 MED ORDER — TRAZODONE HCL 50 MG PO TABS: 50.0000 mg | ORAL_TABLET | Freq: Every day | ORAL | 5 refills | Status: DC

## 2016-03-28 NOTE — Telephone Encounter (Signed)
I have spoken with Gwinda this afternoon and moved March appt. to June/fim

## 2016-03-28 NOTE — Telephone Encounter (Signed)
Pt has appt with Dr Epimenio Foot 05/01/16. Should she keep that appt or schedule a new appt for later?

## 2016-03-28 NOTE — Telephone Encounter (Signed)
Pt called says she thinks she is having a flair up. Please call

## 2016-03-28 NOTE — Telephone Encounter (Signed)
I have spoken with [name] this morning.  Sts. onset 2 weeks ago of increased physical and mental fatigue, legs weaker, difficulty swallowing, balance worse.  Will check with RAS and call her back/fim

## 2016-03-28 NOTE — Progress Notes (Signed)
[facility]  PATIENT: [name] DOB: 01/28/1981  REFERRING DOCTOR OR PCP:  Dr. Cristopher Peru  [facility]) SOURCE: Patient, a couple MRI reports and images on PACS. Lab reports, notes from Dr. Clelia Croft  _________________________________   HISTORICAL  CHIEF COMPLAINT:  Chief Complaint  Patient presents with  . Multiple Sclerosis    Sts. onset 2 weeks ago of increased mental and physical fatigue, difficulty swallowing, with choking episodes (liquids and solids).  More trouble with balance, weakness in her legs, pins and needles sensation in her legs  Denies missed doses of Gilenya/fim    HISTORY OF PRESENT ILLNESS:  [name] is a 36 year old woman with relapsing remitting multiple sclerosis.   Over the past month she has noted more trouble with swallowing and feels her cognitive skills are less sharp and she has more verbal fluency issues.   Focus and attention are worse x 2-3 weeks.   Her legs feel more painful, like they re on fire and she is noting more left arm numbness, especially the 4th and 5th fingers.   Yesterday, while driving she felt she did not know where she was going.   This morning she felt weaker and sat down. She fell asleep x 2 hours and missed work.   She has noted more weakness in the right > left leg since this morning.  MS:   She has been on Gilenya since May 2017.    She felt worse a couple months ago and an MRI was perofrmed which ashowed no new lesions.     Numbness:  She notes that the numbness in her groin is better but now she notes right > left foot/toe numbness, Her legs are sore but no shooting pain.  Gait:  Gait is about the same.    She is tripping due to her right leg feeling mildly weaker and/or clumsy.   She notes right foot drags some while walking, especially with a step or curb.      Vertigo:   She has had vertigo off/on since Friday.    She gets 30-60 seconds of vertigo with some movements like getting out of bed, rolling over  or bending.   She was veering more to the right.    She feels this is better now than last Friday but she is also being more careful.   Her PCP is prescribing meclizine.     Bladder:   She has urgency and frequency with 2-3 nocturia.      Vision: She denies significant visual changes. She denies diplopia..  She denies vertigo.  Fatigue/sleep: She has more physical and cognitive fatigue.  Fatigue is present when she wakes up but will usually worsen as the day goes on. She is trying to exercise some but has scaled back since she felt worse a few hours later.   She is on Adderall XR 20 mg in the am and 10 mg IR at 1 pm.   She falls asleep easily and wakes but wakes up more at night.  She snores but she sleeps alone and she does not know if there are pauses or gasps/snorts.   She has not awoken with a gasp.    She sometimes wakes up when she has more pain or she gets a hypnic start.     Mood/cognition: She notes more depression and some anxiety. Her Zoloft dose was increased Judie Grieve Wall).   She also has noted difficulty with cognitive skills over the past couple of  years. 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   She has not noted any improvement with Adderall.  Other: She has migraine headaches. These are generally stable. She will take ibuprofen when one occurs.  Her mom has MS.     MS history:   In late June or early July 2011, she had the onset of numbness that went up to her belly button. She presented to the emergency room. An MRI of the brain showed 1 periventricular focus with maybe a second subtle periventricular focus. An MRI of the cervical spine showed an enhancing lesion adjacent to C3-C4. The diagnosis of possible MS was made but the evidence was not enough to begin a disease modifying therapy. In 2015, she had the onset of similar symptoms and also had decreased ability to use the left hand.  An MRI of the brain at that time showed several new lesions not present in 2011, including a focus in the right middle cerebellar peduncle additionally, she had a lumbar puncture consistent with MS. Because of her symptoms, MRI changes and lumbar puncture results, she was diagnosed with clinical definite relapsing remitting MS and was started on Tecfidera. She has continued on Tecfidera. Over the past few months she has noted more difficulty with focus and concentration.     She also has had some change in vision more recently. Because of these newer symptoms, and MRI was repeated 04/27/2015. The MRI of the cervical spine showed no new lesions. though the MRI of the brain showed 2  lesions not present in 2015, one in the left juxtacortical white matter and one in the left deep white matter.  She had a small exacerbation in August 2017 and received IV SOlu-Medrol.    MRI 01/2016 showed no new lesions.  She is JCV Ab positive.      REVIEW OF SYSTEMS: Constitutional: No fevers, chills, sweats, or change in appetite.  She has fatigue and insomnia. Eyes: No visual changes, double vision, eye pain Ear, nose and throat: No hearing loss, ear pain, nasal congestion, sore throat Cardiovascular: No chest pain, palpitations Respiratory: No shortness of breath at rest or with exertion.   No wheezes GastrointestinaI: No nausea, vomiting, diarrhea, abdominal pain, fecal incontinence Genitourinary:   She notes frequency, some hesitancy at times and nocturia. Musculoskeletal: No neck pain, back pain Integumentary: No rash, pruritus, skin lesions Neurological: as above Psychiatric: as above Endocrine: No palpitations, diaphoresis, change in appetite, change in weigh or increased thirst Hematologic/Lymphatic: No anemia, purpura, petechiae. Allergic/Immunologic: No itchy/runny eyes, nasal congestion, recent allergic reactions, rashes  ALLERGIES: No Known Allergies  HOME MEDICATIONS:  Current Outpatient  Prescriptions:  .  amphetamine-dextroamphetamine (ADDERALL XR) 20 MG 24 hr capsule, Take 1 capsule (20 mg total) by mouth daily., Disp: 30 capsule, Rfl: 0 .  amphetamine-dextroamphetamine (ADDERALL) 10 MG tablet, Take one or two po in the aftrernoon, Disp: 60 tablet, Rfl: 0 .  baclofen (LIORESAL) 10 MG tablet, Take by mouth., Disp: , Rfl:  .  Cholecalciferol (VITAMIN D-1000 MAX ST) 1000 units tablet, Take by mouth., Disp: , Rfl:  .  cyclobenzaprine (FLEXERIL) 5 MG tablet, Take 1 tablet (5 mg total) by mouth at bedtime., Disp: 30 tablet, Rfl: 5 .  Fingolimod HCl 0.5 MG CAPS, Take 0.5 mg by mouth daily., Disp: , Rfl:  .  ibuprofen (ADVIL,MOTRIN) 200 MG tablet, Take 200 mg by mouth every 6 (  six) hours as needed., Disp: , Rfl:  .  meclizine (ANTIVERT) 25 MG tablet, , Disp: , Rfl: 0 .  oxybutynin (DITROPAN-XL) 10 MG 24 hr tablet, TAKE 1 TABLET (10 MG TOTAL) BY MOUTH AT BEDTIME., Disp: 30 tablet, Rfl: 5 .  sertraline (ZOLOFT) 100 MG tablet, , Disp: , Rfl: 0 .  triamcinolone (NASACORT) 55 MCG/ACT AERO nasal inhaler, Place 2 sprays into the nose daily., Disp: , Rfl:   PAST MEDICAL HISTORY: No past medical history on file.  PAST SURGICAL HISTORY: No past surgical history on file.  FAMILY HISTORY: No family history on file.  SOCIAL HISTORY:  Social History   Social History  . Marital status: Divorced    Spouse name: N/A  . Number of children: N/A  . Years of education: N/A   Occupational History  . Not on file.   Social History Main Topics  . Smoking status: Never Smoker  . Smokeless tobacco: Never Used  . Alcohol use 0.0 oz/week     Comment: occasional/fim  . Drug use: No  . Sexual activity: Not on file   Other Topics Concern  . Not on file   Social History Narrative  . No narrative on file     PHYSICAL EXAM  Vitals:   03/28/16 1245  BP: 112/71  Pulse: (!) 112  Resp: 16  Weight: 169 lb 8 oz (76.9 kg)  Height: 5\' 3"  (1.6 m)    Body mass index is 30.03  kg/m.   General: The patient is well-developed and well-nourished and in no acute distress   Skin:  No rashes or edema  Musculoskeletal:    Mild occipital tenderness/uppe neck tenderness.   Good ROM  Neurologic Exam  Mental status: The patient is alert and oriented x 3 at the time of the examination. The patient has apparent normal recent and remote memory, with an apparently normal attention span and concentration ability.   Speech is normal.  Cranial nerves: Extraocular movements are full.  There is good facial sensation to soft touch bilaterally.Facial strength is normal.  Trapezius and sternocleidomastoid strength is normal. No dysarthria is noted.  The tongue is midline, and the patient has symmetric elevation of the soft palate. No obvious hearing deficits are noted.  Motor:  Muscle bulk is normal.   Tone is normal. Strength is  5 / 5 in all 4 extremities.   Sensory: Sensory testing is intact to pinprick, soft touch and vibration sensation in arms.  She notes reduced right leg vibratory sensation  .  Coordination: Cerebellar testing reveals good finger-nose-finger and reudced heel-to-shin, right worse than left.  Gait and station: Station is normal.   Gait shows a mild right foot drop (worse) Tandem gait is wide. Romberg is negative.   Reflexes: Deep tendon reflexes are symmetric and increased in her legs with spread at the knees bilaterally.   Plantar responses are flexor.     DIAGNOSTIC DATA (LABS, IMAGING, TESTING) - I reviewed patient records, labs, notes, testing and imaging myself where available.  Lab Results  Component Value Date   WBC 4.0 09/01/2015   HGB 13.8 07/28/2013   HCT 41.4 09/01/2015   MCV 85 09/01/2015   PLT 325 09/01/2015      Component Value Date/Time   NA 138 09/01/2015 1128   NA 141 07/28/2013 0410   K 4.4 09/01/2015 1128   K 3.7 07/28/2013 0410   CL 99 09/01/2015 1128   CL 103 07/28/2013 0410   CO2 21 09/01/2015  1128   CO2 29 07/28/2013  0410   GLUCOSE 84 09/01/2015 1128   GLUCOSE 121 (H) 07/28/2013 0410   BUN 12 09/01/2015 1128   BUN 16 07/28/2013 0410   CREATININE 0.74 09/01/2015 1128   CREATININE 0.66 07/28/2013 0410   CALCIUM 9.4 09/01/2015 1128   CALCIUM 8.6 07/28/2013 0410   PROT 7.5 09/01/2015 1128   PROT 6.7 07/25/2013 0410   ALBUMIN 4.7 09/01/2015 1128   ALBUMIN 3.3 (L) 07/25/2013 0410   AST 18 09/01/2015 1128   AST 20 07/25/2013 0410   ALT 27 09/01/2015 1128   ALT 18 07/25/2013 0410   ALKPHOS 83 09/01/2015 1128   ALKPHOS 49 07/25/2013 0410   BILITOT 0.7 09/01/2015 1128   BILITOT 0.4 07/25/2013 0410   GFRNONAA 104 09/01/2015 1128   GFRNONAA >60 07/28/2013 0410   GFRAA 120 09/01/2015 1128   GFRAA >60 07/28/2013 0410       ASSESSMENT AND PLAN  Relapsing remitting multiple sclerosis (HCC)  Gait disturbance  Depression with anxiety  Other fatigue  Urinary frequency  Right foot drop   1.   Likely exacerbation (foot drop).   IV Solu-Medrol x 3 days (consider x 5 if not better by third day).   Consider a different disease modifying therapy, such as ocrelizumab or Lemtrada. We discussed both options and she has some interest in [place] and will read up more.   2.    Continue other medications for her MS symptoms. 3.    Continue Adderall for attention deficit and fatigue (Dr. Sherlean Foot is writing).   Trazodone for sleep 4.    Trazodone 50-100 mg nightly to help insomnia. Return to clinic in 3-4 months or sooner if there are new or worsening neurologic symptoms.  45 minute face-to-face evaluation with greater than one half at time counseling and coordinating care about initiation of Gilenya therapy.  Wayman Hoard A. Epimenio Foot, MD, PhD 03/28/2016, 2:47 PM Certified in Neurology, Clinical Neurophysiology, Sleep Medicine, Pain Medicine and Neuroimaging  [facility] [address] [place], [place] [address] [phone]

## 2016-03-28 NOTE — Telephone Encounter (Signed)
I have spoken with [name] again this morning--w/i appt. given, to arrive at 1230 today.  She is agreeable with this plan/fim

## 2016-04-01 ENCOUNTER — Encounter: Payer: Self-pay | Admitting: Neurology

## 2016-04-03 ENCOUNTER — Telehealth: Payer: Self-pay | Admitting: *Deleted

## 2016-04-03 ENCOUNTER — Encounter: Payer: Self-pay | Admitting: Neurology

## 2016-04-03 DIAGNOSIS — G35 Multiple sclerosis: Secondary | ICD-10-CM

## 2016-04-03 DIAGNOSIS — G35D Multiple sclerosis, unspecified: Secondary | ICD-10-CM

## 2016-04-03 DIAGNOSIS — R269 Unspecified abnormalities of gait and mobility: Secondary | ICD-10-CM

## 2016-04-03 NOTE — Telephone Encounter (Signed)
LMOM for pt. (identified vm), that since she is now c/o heaviness in legs, continued difficulty with gait, after 5 days of IV steroids,  RAS would like to get an MRI of her c/s to r/o new MS lesion.  Will go ahead and order this and she should expect a call to schedule/fim

## 2016-04-04 ENCOUNTER — Ambulatory Visit
Admission: RE | Admit: 2016-04-04 | Discharge: 2016-04-04 | Disposition: A | Discharge status: Home / Self Care | Payer: BLUE CROSS/BLUE SHIELD | Source: Ambulatory Visit | Attending: Neurology | Admitting: Neurology

## 2016-04-04 DIAGNOSIS — G35 Multiple sclerosis: Secondary | ICD-10-CM

## 2016-04-04 DIAGNOSIS — R269 Unspecified abnormalities of gait and mobility: Secondary | ICD-10-CM | POA: Diagnosis not present

## 2016-04-04 MED ORDER — GADOBENATE DIMEGLUMINE 529 MG/ML IV SOLN
15.0000 mL | Freq: Once | INTRAVENOUS | Status: AC | PRN
  Administered 2016-04-04: 15 mL via INTRAVENOUS

## 2016-04-08 ENCOUNTER — Encounter: Payer: Self-pay | Admitting: Neurology

## 2016-04-08 ENCOUNTER — Telehealth: Payer: Self-pay | Admitting: *Deleted

## 2016-04-08 NOTE — Telephone Encounter (Signed)
-----   Message from Asa Lente, MD sent at 04/05/2016 10:28 PM EST ----- Please let her know that the MRI of the cervical spine does not show anything new. It is unchanged from her 2015 MRI.  Also, she appears to have a nodule in the left side of her thyroid gland. This was actually seen on the other MRI as well so it is unlikely to be serious. However, I recommend that we get a thyroid/neck ultrasound.

## 2016-04-10 ENCOUNTER — Encounter: Payer: Self-pay | Admitting: Neurology

## 2016-04-11 ENCOUNTER — Encounter: Payer: Self-pay | Admitting: Neurology

## 2016-04-11 ENCOUNTER — Encounter: Payer: Self-pay | Admitting: *Deleted

## 2016-04-11 ENCOUNTER — Ambulatory Visit (INDEPENDENT_AMBULATORY_CARE_PROVIDER_SITE_OTHER): Payer: BLUE CROSS/BLUE SHIELD | Admitting: Neurology

## 2016-04-11 VITALS — BP 128/86 | HR 104 | Resp 16 | Ht 67.0 in | Wt 173.0 lb

## 2016-04-11 DIAGNOSIS — G35 Multiple sclerosis: Secondary | ICD-10-CM | POA: Diagnosis not present

## 2016-04-11 DIAGNOSIS — M21371 Foot drop, right foot: Secondary | ICD-10-CM

## 2016-04-11 DIAGNOSIS — R200 Anesthesia of skin: Secondary | ICD-10-CM | POA: Diagnosis not present

## 2016-04-11 DIAGNOSIS — R5383 Other fatigue: Secondary | ICD-10-CM

## 2016-04-11 DIAGNOSIS — R269 Unspecified abnormalities of gait and mobility: Secondary | ICD-10-CM

## 2016-04-11 NOTE — Progress Notes (Signed)
[facility]  PATIENT: [name] DOB: 1980/07/11  REFERRING DOCTOR OR PCP:  Dr. Cristopher Peru  [facility]) SOURCE: Patient, a couple MRI reports and images on PACS. Lab reports, notes from Dr. Clelia Croft  _________________________________   HISTORICAL  CHIEF COMPLAINT:  Chief Complaint  Patient presents with  . Multiple Sclerosis    Sts. she continues to tolerate Gilenya well, but due to recent exacerbations of sx, would like to discuss other tx. options.  C/O increased difficulty with gait/balance, memory, cognition, fatigue, numbness lower, mid and upper back.  Today she also c/o sores on her tongue, feeling as it tongue and throat are on fire.  Would like to know if this is related to MS/fim    HISTORY OF PRESENT ILLNESS:  [name] is a 36 year old woman with relapsing remitting multiple sclerosis.   She feels she is walking better but is still using a cane.   She feels off balanced.   She notes an internal tremor sensation.  She has noted more numbness in the right side and in the hip.  She notes she slides down in a seat.   Steroids x 5 days only helped a little bit with gait and not at all with the numbness.   Her tongue feels like it is on fire.  She notes more trouble swallowing and needs to have more liquid to do so.    Over the past month she has noted more trouble with swallowing and feels her cognitive skills are less sharp and she has more verbal fluency issues.   Focus and attention are worse x 2-3 weeks.   Her legs feel more painful, like they re on fire and she is noting more left arm numbness, especially the 4th and 5th fingers.   Yesterday, while driving she felt she did not know where she was going.   This morning she felt weaker and sat down. She fell asleep x 2 hours and missed work.   She has noted more weakness in the right > left leg since this morning.  I reviewed the recent MRI cervical spine and her late 2017 MRI of the brain in her and her  father's presence. She has two spinal plaques, a definite rightt middle cerebellar peduncle plaque and a possible right pontine plaque and several foci in the hemispheres  Numbness:  She still has right > left foot/toe numbness and has newer right flank numbness.     Gait:  Gait is mildly better.    She inotes right foot drop but balance is not as bad.  She cannot tandem walk.     Vertigo:   Vertigo is better.   She was having  30-60 seconds of vertigo with some movements like getting out of bed, rolling over or bending.   She was veering more to the right.    Bladder:   She has urgency and frequency with 2-3 nocturia.      Vision: She denies significant visual changes. She denies diplopia..  She denies vertigo.  Fatigue/sleep: She has more physical and cognitive fatigue. Going back to work was difficult.  Fatigue is present when she wakes up but will usually worsen as the day goes on.    She is on Adderall XR 20 mg in the am and 10 mg IR at 1 pm.   She falls asleep easily and wakes but wakes up more at night.  She snores but has never been told she has gasps, snorts or  pauses. .     Mood/cognition: She notes more depression and some anxiety. Her Zoloft dose was increased Judie Grieve Wall).   She also has noted difficulty with cognitive skills over the past couple of years. 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   She has not noted any improvement with Adderall.  Other: She has migraine headaches. These are generally stable. She will take ibuprofen when one occurs.  Her mom has MS.   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  MS history:   In late June or early July 2011, she had the onset of numbness that went up to her belly button. She presented to the emergency room. An MRI of the brain showed 1 periventricular focus with maybe a second subtle periventricular focus. An MRI of the  cervical spine showed an enhancing lesion adjacent to C3-C4. The diagnosis of possible MS was made but the evidence was not enough to begin a disease modifying therapy. In 2015, she had the onset of similar symptoms and also had decreased ability to use the left hand. An MRI of the brain at that time showed several new lesions not present in 2011, including a focus in the right middle cerebellar peduncle additionally, she had a lumbar puncture consistent with MS. Because of her symptoms, MRI changes and lumbar puncture results, she was diagnosed with clinical definite relapsing remitting MS and was started on Tecfidera. She has continued on Tecfidera. Over the past few months she has noted more difficulty with focus and concentration.     She also has had some change in vision more recently. Because of these newer symptoms, and MRI was repeated 04/27/2015. The MRI of the cervical spine showed no new lesions. though the MRI of the brain showed 2  lesions not present in 2015, one in the left juxtacortical white matter and one in the left deep white matter.  She had a small exacerbation in August 2017 and received IV SOlu-Medrol.    MRI 01/2016 showed no new lesions.  She is JCV Ab positive.      REVIEW OF SYSTEMS: Constitutional: No fevers, chills, sweats, or change in appetite.  She has fatigue and insomnia. Eyes: No visual changes, double vision, eye pain Ear, nose and throat: No hearing loss, ear pain, nasal congestion, sore throat Cardiovascular: No chest pain, palpitations Respiratory: No shortness of breath at rest or with exertion.   No wheezes GastrointestinaI: No nausea, vomiting, diarrhea, abdominal pain, fecal incontinence Genitourinary:   She notes frequency, some hesitancy at times and nocturia. Musculoskeletal: No neck pain, back pain Integumentary: No rash, pruritus, skin lesions Neurological: as above Psychiatric: as above Endocrine: No palpitations, diaphoresis, change in appetite,  change in weigh or increased thirst Hematologic/Lymphatic: No anemia, purpura, petechiae. Allergic/Immunologic: No itchy/runny eyes, nasal congestion, recent allergic reactions, rashes  ALLERGIES: No Known Allergies  HOME MEDICATIONS:  Current Outpatient Prescriptions:  .  amphetamine-dextroamphetamine (ADDERALL XR) 20 MG 24 hr capsule, Take 1 capsule (20 mg total) by mouth daily., Disp: 30 capsule, Rfl: 0 .  amphetamine-dextroamphetamine (ADDERALL) 10 MG tablet, Take one or two po in the aftrernoon, Disp: 60 tablet, Rfl: 0 .  baclofen (LIORESAL) 10 MG tablet, Take by mouth., Disp: , Rfl:  .  Cholecalciferol (VITAMIN D-1000 MAX ST) 1000 units tablet, Take by mouth., Disp: , Rfl:  .  Fingolimod HCl 0.5 MG CAPS, Take  0.5 mg by mouth daily., Disp: , Rfl:  .  ibuprofen (ADVIL,MOTRIN) 200 MG tablet, Take 200 mg by mouth every 6 (six) hours as needed., Disp: , Rfl:  .  meclizine (ANTIVERT) 25 MG tablet, , Disp: , Rfl: 0 .  oxybutynin (DITROPAN-XL) 10 MG 24 hr tablet, TAKE 1 TABLET (10 MG TOTAL) BY MOUTH AT BEDTIME., Disp: 30 tablet, Rfl: 5 .  sertraline (ZOLOFT) 100 MG tablet, , Disp: , Rfl: 0 .  tizanidine (ZANAFLEX) 2 MG capsule, Take 2 mg by mouth at bedtime as needed for muscle spasms., Disp: , Rfl:  .  traZODone (DESYREL) 50 MG tablet, Take 1 tablet (50 mg total) by mouth at bedtime., Disp: 30 tablet, Rfl: 5 .  triamcinolone (NASACORT) 55 MCG/ACT AERO nasal inhaler, Place 2 sprays into the nose daily., Disp: , Rfl:  .  ocrelizumab 600 mg in sodium chloride 0.9 % 500 mL, Inject 600 mg into the vein every 6 (six) months., Disp: , Rfl:   PAST MEDICAL HISTORY: No past medical history on file.  PAST SURGICAL HISTORY: No past surgical history on file.  FAMILY HISTORY: No family history on file.  SOCIAL HISTORY:  Social History   Social History  . Marital status: Divorced    Spouse name: N/A  . Number of children: N/A  . Years of education: N/A   Occupational History  . Not on  file.   Social History Main Topics  . Smoking status: Never Smoker  . Smokeless tobacco: Never Used  . Alcohol use 0.0 oz/week     Comment: occasional/fim  . Drug use: No  . Sexual activity: Not on file   Other Topics Concern  . Not on file   Social History Narrative  . No narrative on file     PHYSICAL EXAM  Vitals:   04/11/16 1003  BP: 128/86  Pulse: (!) 104  Resp: 16  Weight: 173 lb (78.5 kg)  Height: 5\' 7"  (1.702 m)    Body mass index is 27.1 kg/m.   General: The patient is well-developed and well-nourished and in no acute distress   Skin:  No rashes or edema  Musculoskeletal:    Mild occipital tenderness/uppe neck tenderness.   Good ROM  Neurologic Exam  Mental status: The patient is alert and oriented x 3 at the time of the examination. The patient has apparent normal recent and remote memory, with an apparently normal attention span and concentration ability.   Speech is normal.  Cranial nerves: Extraocular movements are full.  There is good facial sensation to soft touch bilaterally.Facial strength is normal.  Trapezius and sternocleidomastoid strength is normal. No dysarthria is noted.  The tongue is midline, and the patient has symmetric elevation of the soft palate. No obvious hearing deficits are noted.  Motor:  Muscle bulk is normal.   Tone is normal. Strength is  5 / 5 in all 4 extremities.   Sensory: Sensory testing is intact to pinprick, soft touch and vibration sensation in arms.  She notes reduced right leg vibratory sensation  .  Coordination: Cerebellar testing reveals good finger-nose-finger and reudced heel-to-shin, right worse than left.  Gait and station: Station is normal.   Gait shows a mild right foot drop (worse) Tandem gait is wide. Romberg is negative.   Reflexes: Deep tendon reflexes are symmetric and increased in her legs with spread at the knees bilaterally.   Plantar responses are flexor.     DIAGNOSTIC DATA (LABS, IMAGING,  TESTING) -  I reviewed patient records, labs, notes, testing and imaging myself where available.  Lab Results  Component Value Date   WBC 4.0 09/01/2015   HGB 13.8 07/28/2013   HCT 41.4 09/01/2015   MCV 85 09/01/2015   PLT 325 09/01/2015      Component Value Date/Time   NA 138 09/01/2015 1128   NA 141 07/28/2013 0410   K 4.4 09/01/2015 1128   K 3.7 07/28/2013 0410   CL 99 09/01/2015 1128   CL 103 07/28/2013 0410   CO2 21 09/01/2015 1128   CO2 29 07/28/2013 0410   GLUCOSE 84 09/01/2015 1128   GLUCOSE 121 (H) 07/28/2013 0410   BUN 12 09/01/2015 1128   BUN 16 07/28/2013 0410   CREATININE 0.74 09/01/2015 1128   CREATININE 0.66 07/28/2013 0410   CALCIUM 9.4 09/01/2015 1128   CALCIUM 8.6 07/28/2013 0410   PROT 7.5 09/01/2015 1128   PROT 6.7 07/25/2013 0410   ALBUMIN 4.7 09/01/2015 1128   ALBUMIN 3.3 (L) 07/25/2013 0410   AST 18 09/01/2015 1128   AST 20 07/25/2013 0410   ALT 27 09/01/2015 1128   ALT 18 07/25/2013 0410   ALKPHOS 83 09/01/2015 1128   ALKPHOS 49 07/25/2013 0410   BILITOT 0.7 09/01/2015 1128   BILITOT 0.4 07/25/2013 0410   GFRNONAA 104 09/01/2015 1128   GFRNONAA >60 07/28/2013 0410   GFRAA 120 09/01/2015 1128   GFRAA >60 07/28/2013 0410       ASSESSMENT AND PLAN  Relapsing remitting multiple sclerosis (HCC)  Gait disturbance  Other fatigue  Right foot drop  Numbness   1.  She has had a recent relapse him a probably spinal in origin, that has only partially improved with steroid treatment. We have previously discussed changing from Gilenya to different treatment area in the past, she also broke through Tecfidera earlier and this is her second exacerbation on Gilenya.   Due to her predilection for spinal and posterior fossa exacerbations, I feel she would most likely benefit from one of the more highly effective therapies and we discussed ocrelizumab, Tysabri and Lemtrada. She is JCV antibody positive. After discussing the risks and benefits. We chose  to begin ocrelizumab. Last year, I checked her for chronic hepatitis B and latent tuberculosis and these tests were negative or nonreactive. Because the effect of Gilenya on lymphocyte redistribution will take 6-8 weeks to wear off, we will try to plan her first ocrelizumab treatment for mid April.   2.    Continue other medications for her MS symptoms, attention and sleep.. Return to clinic in 3-4 months or sooner if there are new or worsening neurologic symptoms.  45 minute face-to-face evaluation with greater than one half at time counseling and coordinating care about initiation of Gilenya therapy.  Daliana Leverett A. Epimenio Foot, MD, PhD 04/11/2016, 11:56 AM Certified in Neurology, Clinical Neurophysiology, Sleep Medicine, Pain Medicine and Neuroimaging  [facility] [address] [place], [place] [address] [phone]

## 2016-05-01 ENCOUNTER — Ambulatory Visit: Payer: Self-pay | Admitting: Neurology

## 2016-05-10 ENCOUNTER — Encounter: Payer: Self-pay | Admitting: Neurology

## 2016-05-13 ENCOUNTER — Telehealth: Payer: Self-pay | Admitting: Neurology

## 2016-05-13 NOTE — Telephone Encounter (Signed)
I have spoken with pt.  Sts. walking has been improved for the last 3-4 weeks.  This morning was walking and legs suddenly buckled.  She feels fatigued.  Sts. she doesn't trust herself to drive, so is now in bed.  Will check with RAS and call her back/fim

## 2016-05-13 NOTE — Telephone Encounter (Signed)
I have spoken with pt. and per RAS, explained that generally, MS exacerbations due not occur suddenly--hopefully this is not an exacerbation.  She should monitor sx. and call back if she is not improving tomorrow/fim

## 2016-05-13 NOTE — Telephone Encounter (Signed)
Patient calling to advise she just fell . Her legs gave out and she could not get up for about 20 minutes. Please call and discuss.

## 2016-05-21 ENCOUNTER — Telehealth: Payer: Self-pay | Admitting: Neurology

## 2016-05-21 NOTE — Telephone Encounter (Signed)
[facility] [phone] called request RX for OCREVUS. (f) [phone]

## 2016-05-21 NOTE — Telephone Encounter (Signed)
Message printed and placed in [name]'s in-box in the infusion suite/fim 

## 2016-05-22 ENCOUNTER — Encounter: Payer: Self-pay | Admitting: Neurology

## 2016-05-22 ENCOUNTER — Other Ambulatory Visit: Payer: Self-pay | Admitting: Neurology

## 2016-05-29 ENCOUNTER — Telehealth: Payer: Self-pay | Admitting: *Deleted

## 2016-05-29 MED ORDER — FINGOLIMOD HCL 0.5 MG PO CAPS: 0.5000 mg | ORAL_CAPSULE | Freq: Every day | ORAL | 3 refills | Status: DC

## 2016-05-29 NOTE — Telephone Encounter (Signed)
Gilenya rx. escribed to [facility] per faxed request/fim

## 2016-06-27 ENCOUNTER — Telehealth: Payer: Self-pay | Admitting: Neurology

## 2016-06-27 MED ORDER — BACLOFEN 10 MG PO TABS: 10.0000 mg | ORAL_TABLET | Freq: Three times a day (TID) | ORAL | 1 refills | Status: DC | PRN

## 2016-06-27 NOTE — Addendum Note (Signed)
Addended by: Candis Schatz I on: 06/27/2016 11:33 AM   Modules accepted: Orders

## 2016-06-27 NOTE — Telephone Encounter (Signed)
I have spoken with Chandra this morning.  She c/o increased cramping in legs, would like to continue Baclofen that was rx'd by previous physician.  Per RAS, ok for Baclofen 10mg  po tid prn.  Rx. escribed to [facility] per pt's request/fim

## 2016-06-27 NOTE — Telephone Encounter (Signed)
Pt calling re: her baclofen (LIORESAL) 10 MG tablet  she said this was filled by her previous neurologist and wants to know if Dr Epimenio Foot will fill it for her, she is in pain and has had a headache for the last 3 days, this medication helps her with that, please call.

## 2016-07-22 ENCOUNTER — Ambulatory Visit: Payer: Self-pay | Admitting: Neurology

## 2016-08-14 ENCOUNTER — Ambulatory Visit (INDEPENDENT_AMBULATORY_CARE_PROVIDER_SITE_OTHER): Payer: BLUE CROSS/BLUE SHIELD | Admitting: Neurology

## 2016-08-14 ENCOUNTER — Encounter: Payer: Self-pay | Admitting: Neurology

## 2016-08-14 VITALS — BP 116/78 | HR 82 | Resp 16 | Ht 67.0 in | Wt 187.0 lb

## 2016-08-14 DIAGNOSIS — G35 Multiple sclerosis: Secondary | ICD-10-CM | POA: Diagnosis not present

## 2016-08-14 DIAGNOSIS — F418 Other specified anxiety disorders: Secondary | ICD-10-CM

## 2016-08-14 DIAGNOSIS — R200 Anesthesia of skin: Secondary | ICD-10-CM

## 2016-08-14 DIAGNOSIS — Z79899 Other long term (current) drug therapy: Secondary | ICD-10-CM

## 2016-08-14 DIAGNOSIS — R269 Unspecified abnormalities of gait and mobility: Secondary | ICD-10-CM | POA: Diagnosis not present

## 2016-08-14 DIAGNOSIS — G43009 Migraine without aura, not intractable, without status migrainosus: Secondary | ICD-10-CM | POA: Diagnosis not present

## 2016-08-14 DIAGNOSIS — F988 Other specified behavioral and emotional disorders with onset usually occurring in childhood and adolescence: Secondary | ICD-10-CM | POA: Diagnosis not present

## 2016-08-14 MED ORDER — AMPHETAMINE-DEXTROAMPHET ER 30 MG PO CP24: 30.0000 mg | ORAL_CAPSULE | Freq: Every day | ORAL | 0 refills | Status: DC

## 2016-08-14 MED ORDER — TOPIRAMATE 100 MG PO TABS: 100.0000 mg | ORAL_TABLET | Freq: Every day | ORAL | 5 refills | Status: DC

## 2016-08-14 MED ORDER — AMPHETAMINE-DEXTROAMPHETAMINE 10 MG PO TABS: ORAL_TABLET | ORAL | 0 refills | Status: DC

## 2016-08-14 NOTE — Progress Notes (Signed)
[facility]  PATIENT: [name] DOB: Jun 11, 1980  REFERRING DOCTOR OR PCP:  Dr. Cristopher Peru  [facility]) SOURCE: Patient, a couple MRI reports and images on PACS. Lab reports, notes from Dr. Clelia Croft  _________________________________   HISTORICAL  CHIEF COMPLAINT:  Chief Complaint  Patient presents with  . Multiple Sclerosis    Sts. she tolerated parts A and B of her first Ocrevus infusion well.  Is scheduled for her first full infuison on 12/21/16.  Is having difficulty losing wt.  Sts. her diet is very high in processed foods and sugars and she wonders if her meds cause her to crave sugar. Sts. skin on legs and arms is hypersensitive.  Also would like to diacuss h/a's/fim    HISTORY OF PRESENT ILLNESS:  [name] is a 36 year old woman with relapsing remitting multiple sclerosis.     MS:     After an exacerbation affecting her gait, she switched from Gilenya to ocrelizumab. Had her first infusion in May and will have her next infusion in November. She tolerated the medication well.  She has had no definite exacerbations though she is noting more numbness and allodynia in the legs and right arm.     HA:   She is experiencing more migraine headaches.  They are located in the right forehead and sometimes also the right occiput.   Headaches occur at least once a week. Pain is throbbing.   Getting in and out of car for multiple short trips seems to trigger them.    When intense, she also has photophobia and phonophobia.   Moving worsens the pain.   She has never been on a triptan.   OTC med's only help the mild HA but not the severe ones.    Numbness:  She is noticing more numbness some allodynia in the legs and also the right arm.  Gait/strength:  Gait worsenend with the exacerbation in 2017 but is better now than earlier this year.    She uses a cane for long distances.   .    She has a mild right foot drop and balance is off.   She can;t tandem walk.  .   She has  mild weakness and spasticity in the right leg. Baclofen has helped the spasticity slightly.  Vertigo:   Vertigo is better.   She sometimes finds that she veers to the right while she walks.   Bladder:   She has urgency and frequency with 2-3 nocturia.      Vision: She denies significant visual changes. She denies diplopia..  She denies vertigo.  Fatigue/sleep: She has more physical and cognitive fatigue. She wakes up tired and gets more tired later in the day.     Sometimes she is mildly sleepy.   Adderall used to help more than now.    She is on Adderall XR 20 mg in the am and 10 mg IR at 1 pm.   She falls asleep easily but sometimes wakes up in the middle of the night.   She snores but has never been told she has gasps, snorts or pauses. .     Mood/cognition: She feels dperessoin and anxiety are better on Zoloft.  She still has ahd a few crying spells especially when she has a lot to do and is having a bad day.   She has less apathy.  She sees [facility].   She also has noted difficulty with cognitive skills over the past couple of  years. 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   She has not noted any improvement with Adderall.   Other:   She has gained some weight and notes she craves sugar.     Her mom has MS.   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  MS history:   In late June or early July 2011, she had the onset of numbness that went up to her belly button. She presented to the emergency room. An MRI of the brain showed 1 periventricular focus with maybe a second subtle periventricular focus. An MRI of the cervical spine showed an enhancing lesion adjacent to C3-C4. The diagnosis of possible MS was made but the evidence was not enough to begin a disease modifying therapy. In 2015, she had the onset of similar symptoms and also had decreased ability to use the left hand. An  MRI of the brain at that time showed several new lesions not present in 2011, including a focus in the right middle cerebellar peduncle additionally, she had a lumbar puncture consistent with MS. Because of her symptoms, MRI changes and lumbar puncture results, she was diagnosed with clinical definite relapsing remitting MS and was started on Tecfidera. She has continued on Tecfidera. Over the past few months she has noted more difficulty with focus and concentration.     She also has had some change in vision more recently. Because of these newer symptoms, and MRI was repeated 04/27/2015. The MRI of the cervical spine showed no new lesions. though the MRI of the brain showed 2  lesions not present in 2015, one in the left juxtacortical white matter and one in the left deep white matter.  She had a small exacerbation in August 2017 and received IV SOlu-Medrol.   MRI cervical spine 2017 an MRI of the brain show two spinal plaques, a definite rightt middle cerebellar peduncle plaque and a possible right pontine plaque and several foci in the hemispheres  MRI 01/2016 showed no new lesions.  She is JCV Ab positive.      REVIEW OF SYSTEMS: Constitutional: No fevers, chills, sweats, or change in appetite.  She has fatigue and insomnia. Eyes: No visual changes, double vision, eye pain Ear, nose and throat: No hearing loss, ear pain, nasal congestion, sore throat Cardiovascular: No chest pain, palpitations Respiratory: No shortness of breath at rest or with exertion.   No wheezes GastrointestinaI: No nausea, vomiting, diarrhea, abdominal pain, fecal incontinence Genitourinary:   She notes frequency, some hesitancy at times and nocturia. Musculoskeletal: No neck pain, back pain Integumentary: No rash, pruritus, skin lesions Neurological: as above Psychiatric: as above Endocrine: No palpitations, diaphoresis, change in appetite, change in weigh or increased thirst Hematologic/Lymphatic: No anemia,  purpura, petechiae. Allergic/Immunologic: No itchy/runny eyes, nasal congestion, recent allergic reactions, rashes  ALLERGIES: No Known Allergies  HOME MEDICATIONS:  Current Outpatient Prescriptions:  .  amphetamine-dextroamphetamine (ADDERALL XR) 30 MG 24 hr capsule, Take 1 capsule (30 mg total) by mouth daily., Disp: 30 capsule, Rfl: 0 .  amphetamine-dextroamphetamine (ADDERALL) 10 MG tablet, Take one or two po in the aftrernoon, Disp: 60 tablet, Rfl: 0 .  baclofen (LIORESAL) 10 MG tablet, Take 1 tablet (10 mg total) by mouth 3 (three) times daily as needed for muscle spasms., Disp: 270 each, Rfl: 1 .  Cholecalciferol (VITAMIN D-1000 MAX ST) 1000 units tablet, Take by mouth.,  Disp: , Rfl:  .  ibuprofen (ADVIL,MOTRIN) 200 MG tablet, Take 200 mg by mouth every 6 (six) hours as needed., Disp: , Rfl:  .  meclizine (ANTIVERT) 25 MG tablet, , Disp: , Rfl: 0 .  ocrelizumab 600 mg in sodium chloride 0.9 % 500 mL, Inject 600 mg into the vein every 6 (six) months., Disp: , Rfl:  .  oxybutynin (DITROPAN-XL) 10 MG 24 hr tablet, TAKE 1 TABLET (10 MG TOTAL) BY MOUTH AT BEDTIME., Disp: 30 tablet, Rfl: 5 .  sertraline (ZOLOFT) 100 MG tablet, , Disp: , Rfl: 0 .  tizanidine (ZANAFLEX) 2 MG capsule, Take 2 mg by mouth at bedtime as needed for muscle spasms., Disp: , Rfl:  .  traZODone (DESYREL) 50 MG tablet, Take 1 tablet (50 mg total) by mouth at bedtime., Disp: 30 tablet, Rfl: 5 .  triamcinolone (NASACORT) 55 MCG/ACT AERO nasal inhaler, Place 2 sprays into the nose daily., Disp: , Rfl:  .  topiramate (TOPAMAX) 100 MG tablet, Take 1 tablet (100 mg total) by mouth at bedtime., Disp: 30 tablet, Rfl: 5  PAST MEDICAL HISTORY: No past medical history on file.  PAST SURGICAL HISTORY: No past surgical history on file.  FAMILY HISTORY: No family history on file.  SOCIAL HISTORY:  Social History   Social History  . Marital status: Divorced    Spouse name: N/A  . Number of children: N/A  . Years of  education: N/A   Occupational History  . Not on file.   Social History Main Topics  . Smoking status: Never Smoker  . Smokeless tobacco: Never Used  . Alcohol use 0.0 oz/week     Comment: occasional/fim  . Drug use: No  . Sexual activity: Not on file   Other Topics Concern  . Not on file   Social History Narrative  . No narrative on file     PHYSICAL EXAM  Vitals:   08/14/16 1302  BP: 116/78  Pulse: 82  Resp: 16  Weight: 187 lb (84.8 kg)  Height: 5\' 7"  (1.702 m)    Body mass index is 29.29 kg/m.   General: The patient is well-developed and well-nourished and in no acute distress   Skin:  No rashes or edema  Musculoskeletal:    She has mild right occipital tenderness/upper neck tenderness.   Good ROM  Neurologic Exam  Mental status: The patient is alert and oriented x 3 at the time of the examination. The patient has apparent normal recent and remote memory, with an apparently normal attention span and concentration ability.   Speech is normal.  Cranial nerves: Extraocular movements are full.  Facial strength and sensation is normal..  The tongue is midline, and the patient has symmetric elevation of the soft palate. No obvious hearing deficits are noted.  Motor:  Muscle bulk is normal.   Tone is increased in legs, right > left.  . Strength is  5 / 5 in all 4 extremities.   Sensory: Sensory testing shows reduced left vibration sensation and reduced right temperature sensation   She notes reduced right leg vibratory sensation  .  Coordination: Cerebellar testing reveals good finger-nose-finger and reudced heel-to-shin, right worse than left.  Gait and station: Station is normal.   Gait shows a very mild right foot drop.   Tandem gait is wide. Romberg is negative.   Reflexes: Deep tendon reflexes are symmetric and increased in her legs with spread at the knees bilaterally.  DIAGNOSTIC DATA (LABS, IMAGING, TESTING) - I reviewed patient records, labs,  notes, testing and imaging myself where available.  Lab Results  Component Value Date   WBC 4.0 09/01/2015   HGB 14.9 09/01/2015   HCT 41.4 09/01/2015   MCV 85 09/01/2015   PLT 325 09/01/2015      Component Value Date/Time   NA 138 09/01/2015 1128   NA 141 07/28/2013 0410   K 4.4 09/01/2015 1128   K 3.7 07/28/2013 0410   CL 99 09/01/2015 1128   CL 103 07/28/2013 0410   CO2 21 09/01/2015 1128   CO2 29 07/28/2013 0410   GLUCOSE 84 09/01/2015 1128   GLUCOSE 121 (H) 07/28/2013 0410   BUN 12 09/01/2015 1128   BUN 16 07/28/2013 0410   CREATININE 0.74 09/01/2015 1128   CREATININE 0.66 07/28/2013 0410   CALCIUM 9.4 09/01/2015 1128   CALCIUM 8.6 07/28/2013 0410   PROT 7.5 09/01/2015 1128   PROT 6.7 07/25/2013 0410   ALBUMIN 4.7 09/01/2015 1128   ALBUMIN 3.3 (L) 07/25/2013 0410   AST 18 09/01/2015 1128   AST 20 07/25/2013 0410   ALT 27 09/01/2015 1128   ALT 18 07/25/2013 0410   ALKPHOS 83 09/01/2015 1128   ALKPHOS 49 07/25/2013 0410   BILITOT 0.7 09/01/2015 1128   BILITOT 0.4 07/25/2013 0410   GFRNONAA 104 09/01/2015 1128   GFRNONAA >60 07/28/2013 0410   GFRAA 120 09/01/2015 1128   GFRAA >60 07/28/2013 0410       ASSESSMENT AND PLAN  Relapsing remitting multiple sclerosis (HCC) - Plan: Comprehensive metabolic panel, CBC with Differential/Platelet  Attention deficit disorder, unspecified hyperactivity presence  Gait disturbance  Migraine without aura and without status migrainosus, not intractable  High risk medication use - Plan: Comprehensive metabolic panel, CBC with Differential/Platelet  Numbness  Depression with anxiety   1.  She will continue ocrelizumab and has her next infusion in November. Labwork today.  2.   Increase the Adderall to 30 mg XR in the morning with 10 mg as needed later in the day for her reduced attention. 3.   Add topiramate and titrate up to 100 mg for migraines. This may also help weight loss.  4.   Continue other medications for  her MS symptoms. Return to clinic in 5-6 months or sooner if there are new or worsening neurologic symptoms.    Richard A. Epimenio Foot, MD, PhD 08/14/2016, 1:36 PM Certified in Neurology, Clinical Neurophysiology, Sleep Medicine, Pain Medicine and Neuroimaging  [facility] [address] [place], [place] [address] [phone] o

## 2016-08-15 ENCOUNTER — Ambulatory Visit: Payer: BLUE CROSS/BLUE SHIELD | Admitting: Neurology

## 2016-08-15 LAB — COMPREHENSIVE METABOLIC PANEL
ALBUMIN: 4.4 g/dL (ref 3.5–5.5)
ALK PHOS: 85 IU/L (ref 39–117)
ALT: 17 IU/L (ref 0–32)
AST: 15 IU/L (ref 0–40)
Albumin/Globulin Ratio: 1.8 (ref 1.2–2.2)
BUN / CREAT RATIO: 11 (ref 9–23)
BUN: 8 mg/dL (ref 6–20)
Bilirubin Total: 0.4 mg/dL (ref 0.0–1.2)
CALCIUM: 8.8 mg/dL (ref 8.7–10.2)
CO2: 23 mmol/L (ref 20–29)
CREATININE: 0.73 mg/dL (ref 0.57–1.00)
Chloride: 100 mmol/L (ref 96–106)
GFR calc Af Amer: 121 mL/min/{1.73_m2} (ref >59)
GFR calc non Af Amer: 105 mL/min/{1.73_m2} (ref >59)
Globulin, Total: 2.5 g/dL (ref 1.5–4.5)
Glucose: 84 mg/dL (ref 65–99)
Potassium: 3.5 mmol/L (ref 3.5–5.2)
Sodium: 137 mmol/L (ref 134–144)
Total Protein: 6.9 g/dL (ref 6.0–8.5)

## 2016-08-15 LAB — CBC WITH DIFFERENTIAL/PLATELET
BASOS: 0 %
Basophils Absolute: 0 10*3/uL (ref 0.0–0.2)
EOS (ABSOLUTE): 0.2 10*3/uL (ref 0.0–0.4)
EOS: 2 %
HEMATOCRIT: 41 % (ref 34.0–46.6)
HEMOGLOBIN: 13.4 g/dL (ref 11.1–15.9)
IMMATURE GRANULOCYTES: 0 %
Immature Grans (Abs): 0 10*3/uL (ref 0.0–0.1)
Lymphocytes Absolute: 1.1 10*3/uL (ref 0.7–3.1)
Lymphs: 11 %
MCH: 28.6 pg (ref 26.6–33.0)
MCHC: 32.7 g/dL (ref 31.5–35.7)
MCV: 87 fL (ref 79–97)
MONOCYTES: 8 %
Monocytes Absolute: 0.8 10*3/uL (ref 0.1–0.9)
NEUTROS PCT: 79 %
Neutrophils Absolute: 7.9 10*3/uL — ABNORMAL HIGH (ref 1.4–7.0)
Platelets: 357 10*3/uL (ref 150–379)
RBC: 4.69 x10E6/uL (ref 3.77–5.28)
RDW: 13.4 % (ref 12.3–15.4)
WBC: 10.1 10*3/uL (ref 3.4–10.8)

## 2016-08-16 ENCOUNTER — Telehealth: Payer: Self-pay | Admitting: *Deleted

## 2016-08-16 NOTE — Telephone Encounter (Signed)
LMOM (identified vm) that per RAS, lab work done in our office is fine.  She does not need to return this call unless she has questions/fim 

## 2016-08-16 NOTE — Telephone Encounter (Signed)
-----   Message from Asa Lente, MD sent at 08/15/2016  4:43 PM EDT ----- Please let the patient know that the lab work is fine.

## 2016-08-20 ENCOUNTER — Telehealth: Payer: Self-pay | Admitting: Neurology

## 2016-08-20 NOTE — Telephone Encounter (Signed)
Pt calling to inform that she is having exacerbations since Fri & Sat. Of last week. Pt experiencing extreme fatigue, vision problems, numbness down back & legs, unable to feel her face at times, difficulty finding her words at time. Pt has gone from a cane to a walker, she is working right now but will be done by 1:00pm.  Pt said that she has not gone to the ED she wants to be seen at [facility].  Please call

## 2016-08-20 NOTE — Telephone Encounter (Signed)
I have spoken with [name] this afternoon.  RAS just saw her on 08/14/16.  Per RAS, ok for 3 days of SM 1gm IV daily.  She is agreeable.  Orders given to [facility] in the infusion suite, and pt. will come in now/fim

## 2016-08-23 ENCOUNTER — Ambulatory Visit (INDEPENDENT_AMBULATORY_CARE_PROVIDER_SITE_OTHER): Payer: BLUE CROSS/BLUE SHIELD | Admitting: Neurology

## 2016-08-23 ENCOUNTER — Encounter: Payer: Self-pay | Admitting: Neurology

## 2016-08-23 VITALS — BP 130/80 | HR 99 | Resp 18 | Ht 67.0 in | Wt 184.0 lb

## 2016-08-23 DIAGNOSIS — R200 Anesthesia of skin: Secondary | ICD-10-CM | POA: Diagnosis not present

## 2016-08-23 DIAGNOSIS — M21371 Foot drop, right foot: Secondary | ICD-10-CM

## 2016-08-23 DIAGNOSIS — G43009 Migraine without aura, not intractable, without status migrainosus: Secondary | ICD-10-CM

## 2016-08-23 DIAGNOSIS — R269 Unspecified abnormalities of gait and mobility: Secondary | ICD-10-CM | POA: Diagnosis not present

## 2016-08-23 DIAGNOSIS — R5383 Other fatigue: Secondary | ICD-10-CM

## 2016-08-23 DIAGNOSIS — F988 Other specified behavioral and emotional disorders with onset usually occurring in childhood and adolescence: Secondary | ICD-10-CM | POA: Diagnosis not present

## 2016-08-23 DIAGNOSIS — F418 Other specified anxiety disorders: Secondary | ICD-10-CM

## 2016-08-23 DIAGNOSIS — G35 Multiple sclerosis: Secondary | ICD-10-CM

## 2016-08-23 NOTE — Progress Notes (Signed)
[facility]  PATIENT: [name] DOB: 1980/10/04  REFERRING DOCTOR OR PCP:  Dr. Cristopher Peru  [facility]) SOURCE: Patient, a couple MRI reports and images on PACS. Lab reports, notes from Dr. Clelia Croft  _________________________________   HISTORICAL  CHIEF COMPLAINT:  Chief Complaint  Patient presents with  . Multiple Sclerosis    Sts. she is having more fatigue, difficulty with balance.  Numbess from middle back down to thighs, right and left side of face.  Now using a walker b/c cane was putting too much pressure on her wrist, causing problems with playing the guitar.  Also c/o "film" over both eyes, with bright spots in vision/fim    HISTORY OF PRESENT ILLNESS:  [name] is a 36 year old woman with relapsing remitting multiple sclerosis.     MS:   She had an exacerbation in 2017 and early 2018 switch to ocrelizumab with her first doses in May 2018.  She had another small exacerbation last week she has received 3 days of IV site measured but does not feel that she is any better. She has gone from using a cane to a walker for balance during still walk with a cane. Around the house she uses walls and furniture's.  She tolerates the ocrelizumab well.    HA:   Recently we started Topamax and her headaches are doing better. She has not had any migraines this week. When present, they are located in the right forehead and sometimes also the right occiput.   Headaches occur at least once a week. Pain is throbbing.   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    When intense, she also has photophobia and phonophobia.   Moving worsens the pain.   She takes NSAIDs with mild benefit..    Gait/strength:  Gait is worse and she now uses a walker as the cane bothered her arm/wrist a lot.    She plays guitar at work.   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   Gait worsenend with the exacerbation in 2017 but is better now than earlier this year.   She was walking without a cane last year. The balance is affecting her gait more than the strength She can;t tandem walk.  .   She has mild weakness and spasticity in the right leg. Baclofen has helped the spasticity slightly.  Numbness:  She notes more numbness.  In the mornings, numbness seems to be in her legs only but by later in the day, she has numbness in her arms. There is some allodynia in the legs at times..  Vertigo:   Vertigo is better.   She sometimes finds that she veers to the right while she walks.   Bladder:   She has urgency and frequency with 2-3 nocturia.   Oxybutynin has helped   Vision: She denies significant visual changes. She denies diplopia..  She denies vertigo.  Fatigue/sleep: She has more physical and cognitive fatigue. She wakes up tired and gets more tired later in the day.     Sometimes she is mildly sleepy.      The higher dose of Adderall is only helping a bit.   She is on Adderall XR 30 mg in the am and 10 mg IR at 1 pm.   She falls asleep easily but sometimes wakes up in the  middle of the night.   She snores but has never been told she has gasps, snorts or pauses. .     Mood/cognition: She is frustrated and more grumpy with her recent changes. She has some irritability and a couple crying spells with frustration.     She has less apathy.  She sees [employer].   She also has noted difficulty with cognitive skills over the past couple of years.     Other:   She has gained some weight and notes she craves sugar.     Her mom has MS.   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  MS history:   In late June or early July 2011, she had the onset of numbness that went up to her belly button. She presented to the emergency room. An MRI of the brain showed 1 periventricular focus with maybe a second subtle periventricular focus. An MRI of the cervical  spine showed an enhancing lesion adjacent to C3-C4. The diagnosis of possible MS was made but the evidence was not enough to begin a disease modifying therapy. In 2015, she had the onset of similar symptoms and also had decreased ability to use the left hand. An MRI of the brain at that time showed several new lesions not present in 2011, including a focus in the right middle cerebellar peduncle additionally, she had a lumbar puncture consistent with MS. Because of her symptoms, MRI changes and lumbar puncture results, she was diagnosed with clinical definite relapsing remitting MS and was started on Tecfidera. She has continued on Tecfidera. Over the past few months she has noted more difficulty with focus and concentration.     She also has had some change in vision more recently. Because of these newer symptoms, and MRI was repeated 04/27/2015. The MRI of the cervical spine showed no new lesions. though the MRI of the brain showed 2  lesions not present in 2015, one in the left juxtacortical white matter and one in the left deep white matter.  She had a small exacerbation in August 2017 and received IV SOlu-Medrol.   MRI cervical spine 2017 an MRI of the brain show two spinal plaques, a definite rightt middle cerebellar peduncle plaque and a possible right pontine plaque and several foci in the hemispheres  MRI 01/2016 showed no new lesions.  She is JCV Ab positive.      REVIEW OF SYSTEMS: Constitutional: No fevers, chills, sweats, or change in appetite.  She has fatigue and insomnia. Eyes: No visual changes, double vision, eye pain Ear, nose and throat: No hearing loss, ear pain, nasal congestion, sore throat Cardiovascular: No chest pain, palpitations Respiratory: No shortness of breath at rest or with exertion.   No wheezes GastrointestinaI: No nausea, vomiting, diarrhea, abdominal pain, fecal incontinence Genitourinary:   She notes frequency, some hesitancy at times and  nocturia. Musculoskeletal: No neck pain, back pain Integumentary: No rash, pruritus, skin lesions Neurological: as above Psychiatric: as above Endocrine: No palpitations, diaphoresis, change in appetite, change in weigh or increased thirst Hematologic/Lymphatic: No anemia, purpura, petechiae. Allergic/Immunologic: No itchy/runny eyes, nasal congestion, recent allergic reactions, rashes  ALLERGIES: No Known Allergies  HOME MEDICATIONS:  Current Outpatient Prescriptions:  .  amphetamine-dextroamphetamine (ADDERALL XR) 30 MG 24 hr capsule, Take 1 capsule (30 mg total) by mouth daily., Disp: 30 capsule, Rfl: 0 .  amphetamine-dextroamphetamine (ADDERALL) 10 MG tablet, Take one or two po in the aftrernoon, Disp: 60 tablet, Rfl: 0 .  baclofen (LIORESAL) 10 MG tablet, Take 1 tablet (10 mg total) by mouth 3 (three) times daily as needed for muscle spasms., Disp: 270 each, Rfl: 1 .  Cholecalciferol (VITAMIN D-1000 MAX ST) 1000 units tablet, Take by mouth., Disp: , Rfl:  .  ibuprofen (ADVIL,MOTRIN) 200 MG tablet, Take 200 mg by mouth every 6 (six) hours as needed., Disp: , Rfl:  .  ocrelizumab 600 mg in sodium chloride 0.9 % 500 mL, Inject 600 mg into the vein every 6 (six) months., Disp: , Rfl:  .  oxybutynin (DITROPAN-XL) 10 MG 24 hr tablet, TAKE 1 TABLET (10 MG TOTAL) BY MOUTH AT BEDTIME., Disp: 30 tablet, Rfl: 5 .  sertraline (ZOLOFT) 100 MG tablet, , Disp: , Rfl: 0 .  tizanidine (ZANAFLEX) 2 MG capsule, Take 2 mg by mouth at bedtime as needed for muscle spasms., Disp: , Rfl:  .  topiramate (TOPAMAX) 100 MG tablet, Take 1 tablet (100 mg total) by mouth at bedtime., Disp: 30 tablet, Rfl: 5 .  traZODone (DESYREL) 50 MG tablet, Take 1 tablet (50 mg total) by mouth at bedtime., Disp: 30 tablet, Rfl: 5  PAST MEDICAL HISTORY: No past medical history on file.  PAST SURGICAL HISTORY: No past surgical history on file.  FAMILY HISTORY: No family history on file.  SOCIAL HISTORY:  Social  History   Social History  . Marital status: Divorced    Spouse name: N/A  . Number of children: N/A  . Years of education: N/A   Occupational History  . Not on file.   Social History Main Topics  . Smoking status: Never Smoker  . Smokeless tobacco: Never Used  . Alcohol use 0.0 oz/week     Comment: occasional/fim  . Drug use: No  . Sexual activity: Not on file   Other Topics Concern  . Not on file   Social History Narrative  . No narrative on file     PHYSICAL EXAM  Vitals:   08/23/16 0904  BP: 130/80  Pulse: 99  Resp: 18  Weight: 184 lb (83.5 kg)  Height: 5\' 7"  (1.702 m)    Body mass index is 28.82 kg/m.   General: The patient is well-developed and well-nourished and in no acute distress   Skin:  No rashes or edema  Musculoskeletal:    The neck is nontender with a good range of motion.  Neurologic Exam  Mental status: The patient is alert and oriented x 3 at the time of the examination. The patient has apparent normal recent and remote memory, with an apparently normal attention span and concentration ability.   Speech is normal.  Cranial nerves: Extraocular movements are full.  Facial strength and sensation is normal...  The tongue is midline, and the patient has symmetric elevation of the soft palate. No obvious hearing deficits are noted.  Motor:  Muscle bulk is normal.   Tone is increased in legs, bilaterally fairly equally.  . Strength is  5 / 5 in all 4 extremities except 4+/5 in the feet.   Sensory: Sensory testing shows reduced left vibration sensation and reduced right temperature sensation   She notes reduced right leg vibratory sensation  .  Coordination: Cerebellar testing reveals good finger-nose-finger and reudced heel-to-shin, right worse than left.  Gait and station: Station is normal.   Gait shows a left > right foot drop.   She cannot tandem without holding on Romberg is negative.   Reflexes: Deep tendon reflexes are symmetric and  increased in  her legs with spread at the knees bilaterally.         DIAGNOSTIC DATA (LABS, IMAGING, TESTING) - I reviewed patient records, labs, notes, testing and imaging myself where available.  Lab Results  Component Value Date   WBC 10.1 08/14/2016   HGB 13.4 08/14/2016   HCT 41.0 08/14/2016   MCV 87 08/14/2016   PLT 357 08/14/2016      Component Value Date/Time   NA 137 08/14/2016 1332   NA 141 07/28/2013 0410   K 3.5 08/14/2016 1332   K 3.7 07/28/2013 0410   CL 100 08/14/2016 1332   CL 103 07/28/2013 0410   CO2 23 08/14/2016 1332   CO2 29 07/28/2013 0410   GLUCOSE 84 08/14/2016 1332   GLUCOSE 121 (H) 07/28/2013 0410   BUN 8 08/14/2016 1332   BUN 16 07/28/2013 0410   CREATININE 0.73 08/14/2016 1332   CREATININE 0.66 07/28/2013 0410   CALCIUM 8.8 08/14/2016 1332   CALCIUM 8.6 07/28/2013 0410   PROT 6.9 08/14/2016 1332   PROT 6.7 07/25/2013 0410   ALBUMIN 4.4 08/14/2016 1332   ALBUMIN 3.3 (L) 07/25/2013 0410   AST 15 08/14/2016 1332   AST 20 07/25/2013 0410   ALT 17 08/14/2016 1332   ALT 18 07/25/2013 0410   ALKPHOS 85 08/14/2016 1332   ALKPHOS 49 07/25/2013 0410   BILITOT 0.4 08/14/2016 1332   BILITOT 0.4 07/25/2013 0410   GFRNONAA 105 08/14/2016 1332   GFRNONAA >60 07/28/2013 0410   GFRAA 121 08/14/2016 1332   GFRAA >60 07/28/2013 0410       ASSESSMENT AND PLAN  Relapsing remitting multiple sclerosis (HCC)  Gait disturbance  Depression with anxiety  Other fatigue  Right foot drop  Numbness  Attention deficit disorder, unspecified hyperactivity presence  Migraine without aura and without status migrainosus, not intractable     1.  One more day of IV Solu-Medrol (one gram).  She returns for her next ocrelizumab in November 2.   Continue the Adderall to 30 mg XR in the morning with 10 mg as needed later in the day for her reduced attention. 3.   Increase topiramate to 100 mg for migraines. This may also help weight loss.  4.   Continue  other medications for her MS symptoms. Return to clinic in 5-6 months or sooner if there are new or worsening neurologic symptoms.    Bridey Brookover A. Epimenio Foot, MD, PhD 08/23/2016, 9:31 AM Certified in Neurology, Clinical Neurophysiology, Sleep Medicine, Pain Medicine and Neuroimaging  [facility] [address], [address] [place], [place] [address] [phone] o

## 2016-08-25 ENCOUNTER — Encounter: Payer: Self-pay | Admitting: Neurology

## 2016-08-26 ENCOUNTER — Telehealth: Payer: Self-pay | Admitting: Neurology

## 2016-08-26 NOTE — Telephone Encounter (Signed)
Pt calling to inform that with the 4 days of the last medication she numbness is worsen and nothing has gotten better.  Pt wants a call back to know if this is something that she will just have to deal with or is there something else that can be done

## 2016-08-26 NOTE — Telephone Encounter (Signed)
Responded via My Chart/fim

## 2016-09-08 ENCOUNTER — Emergency Department (HOSPITAL_COMMUNITY)
Admission: EM | Admit: 2016-09-08 | Discharge: 2016-09-08 | Disposition: A | Discharge status: Home / Self Care | Payer: BLUE CROSS/BLUE SHIELD | Attending: Emergency Medicine | Admitting: Emergency Medicine

## 2016-09-08 ENCOUNTER — Encounter (HOSPITAL_COMMUNITY): Payer: Self-pay | Admitting: *Deleted

## 2016-09-08 ENCOUNTER — Emergency Department (HOSPITAL_COMMUNITY): Payer: BLUE CROSS/BLUE SHIELD

## 2016-09-08 DIAGNOSIS — R531 Weakness: Secondary | ICD-10-CM | POA: Insufficient documentation

## 2016-09-08 DIAGNOSIS — R202 Paresthesia of skin: Secondary | ICD-10-CM | POA: Insufficient documentation

## 2016-09-08 DIAGNOSIS — Z79899 Other long term (current) drug therapy: Secondary | ICD-10-CM | POA: Diagnosis not present

## 2016-09-08 DIAGNOSIS — G35 Multiple sclerosis: Secondary | ICD-10-CM

## 2016-09-08 DIAGNOSIS — H538 Other visual disturbances: Secondary | ICD-10-CM | POA: Diagnosis not present

## 2016-09-08 DIAGNOSIS — R200 Anesthesia of skin: Secondary | ICD-10-CM | POA: Diagnosis present

## 2016-09-08 HISTORY — DX: Multiple sclerosis, unspecified: G35.D

## 2016-09-08 HISTORY — DX: Multiple sclerosis: G35

## 2016-09-08 LAB — URINALYSIS, MICROSCOPIC (REFLEX)

## 2016-09-08 LAB — CBC
HCT: 40.2 % (ref 36.0–46.0)
Hemoglobin: 13.9 g/dL (ref 12.0–15.0)
MCH: 28.5 pg (ref 26.0–34.0)
MCHC: 34.6 g/dL (ref 30.0–36.0)
MCV: 82.5 fL (ref 78.0–100.0)
Platelets: 265 10*3/uL (ref 150–400)
RBC: 4.87 MIL/uL (ref 3.87–5.11)
RDW: 12.9 % (ref 11.5–15.5)
WBC: 9.3 10*3/uL (ref 4.0–10.5)

## 2016-09-08 LAB — URINALYSIS, ROUTINE W REFLEX MICROSCOPIC
BILIRUBIN URINE: NEGATIVE
Glucose, UA: NEGATIVE mg/dL
HGB URINE DIPSTICK: NEGATIVE
Ketones, ur: NEGATIVE mg/dL
Nitrite: NEGATIVE
PH: 5.5 (ref 5.0–8.0)
Protein, ur: NEGATIVE mg/dL

## 2016-09-08 LAB — BASIC METABOLIC PANEL
Anion gap: 9 (ref 5–15)
BUN: 8 mg/dL (ref 6–20)
CHLORIDE: 108 mmol/L (ref 101–111)
CO2: 22 mmol/L (ref 22–32)
CREATININE: 0.76 mg/dL (ref 0.44–1.00)
Calcium: 8.7 mg/dL — ABNORMAL LOW (ref 8.9–10.3)
GFR calc Af Amer: >60 mL/min (ref >60)
GFR calc non Af Amer: >60 mL/min (ref >60)
Glucose, Bld: 98 mg/dL (ref 65–99)
Potassium: 3.2 mmol/L — ABNORMAL LOW (ref 3.5–5.1)
Sodium: 139 mmol/L (ref 135–145)

## 2016-09-08 MED ORDER — KETOROLAC TROMETHAMINE 30 MG/ML IJ SOLN
30.0000 mg | Freq: Once | INTRAMUSCULAR | Status: AC
  Administered 2016-09-08: 30 mg via INTRAVENOUS
  Filled 2016-09-08: qty 1

## 2016-09-08 MED ORDER — SODIUM CHLORIDE 0.9 % IV BOLUS (SEPSIS)
1000.0000 mL | Freq: Once | INTRAVENOUS | Status: AC
  Administered 2016-09-08: 1000 mL via INTRAVENOUS

## 2016-09-08 MED ORDER — GADOBENATE DIMEGLUMINE 529 MG/ML IV SOLN
20.0000 mL | Freq: Once | INTRAVENOUS | Status: AC
  Administered 2016-09-08: 17 mL via INTRAVENOUS

## 2016-09-08 MED ORDER — METOCLOPRAMIDE HCL 5 MG/ML IJ SOLN
10.0000 mg | Freq: Once | INTRAMUSCULAR | Status: AC
  Administered 2016-09-08: 10 mg via INTRAVENOUS
  Filled 2016-09-08: qty 2

## 2016-09-08 NOTE — ED Provider Notes (Signed)
Please see previous physicians note regarding patient's presenting history and physical, initial ED course, and associated medical decision making.  Spoke with Dr. Laurence Slate who feels the symptoms are atypical for that of MS flare. He felt that his MRI was negative for any acute flare and overall medical workup unremarkable that she would be able to be discharged home with neurology follow-up. Was given migraine cocktail for questionable atypical migraine symptoms.  MRI should reviewed and visualized. There is chronic MS lesions, slightly improved compared to 2017 per radiology. There is no active lesions or signs of MS flare. No other acute intracranial abnormalities. Blood work is overall reassuring. Chest x-ray without signs of infection. UA without obvious UTI.  At follow-up patient does feel improved. She will follow-up with her neurologist regarding ongoing management. Strict return and follow-up instructions reviewed. She expressed understanding of all discharge instructions and felt comfortable with the plan of care.    Lavera Guise, MD 09/08/16 2141

## 2016-09-08 NOTE — Consult Note (Signed)
Neurology Consultation Reason for Consult: MS exacerbation Referring Physician: Dr. Jeraldine Loots  CC: facial numbness, bilateral vision blurring and worsening left dexterity  HPI: [name] is a 36 y.o. female with a history of relapsing remitting multiple sclerosis who being followed by Dr. Epimenio Foot comes to the the ER for facial numbness, blurring vision and worsening left hand dexterity.  For the past week, the patient has been noticing that she has feeling of being numb all over her face, however much more prominent when she woke up this morning. She also has been having intermittent blurring of vision and has been seeing floaters, lines and other visual aura. ( New symptoms)   Her other concerning symptoms is that she has been having some difficulty playing instruments with her left dominant hand  as well as increased stiffening of her right leg (exacerbation of her old deficits)  She was recently received a pulse of steroids about 3 weeks ago for exacerbation of her symptoms. She was recently started on Topamax during her last clinic visit for migraine headaches and has increased the dose 100 mg daily. She denies having any urinary symptoms, fevers, chills. Denies going out in the sun or any increased exertional activity.  Multiple sclerosis history  Presentation 2011- had numbness around her bellybutton and found to have a lesion in the C3-C4 C-spine. Not started on DMD 2015- she decreased ability to use her left hand with with new lesion seen on right middle cerebral peduncle in addition to several other lesions, LP was confirmatory - started on Tecfidera. 2017- difficulty with focus and concentration , MRI showed 2 new lesions, C-spine showed no new lesions. She was started on gilenya 2018- had another exacerbation with foot drop, started on Ocrelizumab. Received part a and B doses and schedule to receive complete dose in November 2018.    ROS: A 14 point ROS was performed and is  negative except as noted in the HPI.    Past Medical History:  Diagnosis Date  . MS (multiple sclerosis) (HCC)     Family history Both parents have MS.   Social History:  reports that she has never smoked. She has never used smokeless tobacco. She reports that she drinks alcohol. She reports that she does not use drugs.   Exam: Current vital signs: BP 110/71   Pulse (!) 109   Temp 98.3 F (36.8 C) (Oral)   Resp 16   SpO2 100%  Vital signs in last 24 hours: Temp:  [98.3 F (36.8 C)] 98.3 F (36.8 C) (08/05 1340) Pulse Rate:  [60-119] 109 (08/05 1930) Resp:  [16] 16 (08/05 1553) BP: (110-129)/(66-101) 110/71 (08/05 1930) SpO2:  [99 %-100 %] 100 % (08/05 1930)   Physical Exam  Constitutional: Appears well-developed and well-nourished.  Psych: Affect appropriate to situation Eyes: No scleral injection HENT: No OP obstrucion Head: Normocephalic.  Cardiovascular: Normal rate and regular rhythm.  Respiratory: Effort normal and breath sounds normal to anterior ascultation GI: Soft.  No distension. There is no tenderness.  Skin: WDI  Neuro: Mental Status: Patient is awake, alert, oriented to person, place, month, year, and situation. Patient is able to give a clear and coherent history. No signs of aphasia or neglect  Cranial Nerves: II: Visual Fields are full. Pupils are equal, round, and reactive to light. Does have blurring of vision in left eye in all 4 quadrants that improves with focus. Fundus showed no evidence of papilledema.  III,IV, VI: EOMI without ptosis or diploplia. No  INO.  V: Facial sensation is symmetric to temperature VII: Facial movement is symmetric.  VIII: hearing is intact to voice X: Uvula elevates symmetrically XI: Shoulder shrug is symmetric. XII: tongue is midline without atrophy or fasciculations.  Motor: Tone is increased over right leg> left l. Bulk is normal. 5/5 strength was present in all four extremities.  Sensory: Parasthesia over  entire face. Sensation is symmetric to light touch and temperature in the both legs, reduced over left arm.  Plantars: Toes are downgoing bilaterally.  Cerebellar: FN intact GAIT: did not test as patient connected to IV line, denies significant change  ASSESSMENT AND PLAN   [name] is a 36 y.o. female with a history of relapsing remitting multiple sclerosis who being followed by Dr. Epimenio Foot comes to the the ER for facial numbness, blurring vision ( new symptoms) and worsening left hand dexterity and increased right leg spasticity.   I do suspect that based on description of facial numbness/blurring of vision may be associated with migraines as well as her increased dose of Topamax that can cause paresthesias. However, patient has new symptoms  as well as worsening of old deficits and warrants an MRI head with and without contrast to rule out exacerbation of her MS and admission for IV steroids. Also, she has not had an MRI Brain in over a year and is transitioning to a new treatment regimen and is at higher risk for having a remission.  Recommendations  Please check UA, CXR, CBC and BMP IV fluids, toradol  MRI Head w/wo contrast to rule out new lesions -if new lesions are seen with recommended admission for IV steroids. If MRI head is negative, the patient can be discharged with expedient follow-up with Dr. Epimenio Foot.   Georgiana Spinner Aroor MD [facility] [phone]  If 7pm to 7am, please call on call as listed on AMION.

## 2016-09-08 NOTE — ED Notes (Signed)
Patient transported to MRI 

## 2016-09-08 NOTE — ED Triage Notes (Signed)
Pt to ED for eval of possible MS flare up. Pt states over the past few days she has noticed an increased heaviness- since yesterday pt had numbness to face (minimal). Pt woke this am with a HA and full facial numbness and blurred vision.

## 2016-09-08 NOTE — ED Notes (Signed)
Pt departed in NAD.  

## 2016-09-08 NOTE — ED Provider Notes (Signed)
[facility] DEPT Provider Note   CSN: [id number] Arrival date & time: 09/08/16  1334     History   Chief Complaint Chief Complaint  Patient presents with  . Numbness  . Blurred Vision    HPI [name] is a 36 y.o. female.  Patient with a history of relapsing remitting multiple sclerosis presenting with numbness and blurry vision. The numbness began approximately 1 week ago and has been off and on since that time. In the last day the numbness has been present more often than not. The facial numbness is bilateral CNV1-3 distribution and she has associated intermittent vision changes including blurry vision, seeing spots, and seeing lines. She has also noticed that for the past couple of weeks she has some difficulty playing instruments (forming cords on the guitar and playing the correct key on piano.) She see Dr. Epimenio Foot at [facility] and was most recently seen in July for leg numbness and weakness and received 4 days of solumedrol and that time, which she states did not alleviate her symptoms.      Past Medical History:  Diagnosis Date  . MS (multiple sclerosis) [facility])     Patient Active Problem List   Diagnosis Date Noted  . Numbness 04/11/2016  . Right foot drop 03/28/2016  . Attention deficit disorder 09/01/2015  . H/O disease 05/25/2015  . History of migraine headaches 05/25/2015  . Headache, migraine 05/25/2015  . Allergic rhinitis, seasonal 05/25/2015  . High risk medication use 05/25/2015  . Gait disturbance 05/25/2015  . Depression with anxiety 05/25/2015  . Other fatigue 05/25/2015  . Urinary frequency 05/25/2015  . Relapsing remitting multiple sclerosis (HCC) 09/01/2014    History reviewed. No pertinent surgical history.  OB History    Obstetric Comments   IUD - placed 2016       Home Medications    Prior to Admission medications   Medication Sig Start Date End Date Taking? Authorizing Provider  amphetamine-dextroamphetamine (ADDERALL  XR) 30 MG 24 hr capsule Take 1 capsule (30 mg total) by mouth daily. 08/14/16  Yes Sater, Pearletha Furl, MD  amphetamine-dextroamphetamine (ADDERALL) 10 MG tablet Take one or two po in the aftrernoon 08/14/16  Yes Sater, Pearletha Furl, MD  baclofen (LIORESAL) 10 MG tablet Take 1 tablet (10 mg total) by mouth 3 (three) times daily as needed for muscle spasms. 06/27/16  Yes Sater, Pearletha Furl, MD  Cholecalciferol (VITAMIN D-1000 MAX ST) 1000 units tablet Take 5,000 Units by mouth daily.    Yes [provider]  Menthol, Topical Analgesic, (BIOFREEZE EX) Apply 1 application topically as needed (Pain).   Yes [provider]  ocrelizumab 600 mg in sodium chloride 0.9 % 500 mL Inject 600 mg into the vein every 6 (six) months.   Yes [provider]  oxybutynin (DITROPAN-XL) 10 MG 24 hr tablet TAKE 1 TABLET (10 MG TOTAL) BY MOUTH AT BEDTIME. 05/22/16  Yes Sater, Pearletha Furl, MD  sertraline (ZOLOFT) 100 MG tablet  11/29/15  Yes [provider]  topiramate (TOPAMAX) 100 MG tablet Take 1 tablet (100 mg total) by mouth at bedtime. 08/14/16  Yes Sater, Pearletha Furl, MD  traZODone (DESYREL) 50 MG tablet Take 1 tablet (50 mg total) by mouth at bedtime. 03/28/16  Yes Sater, Pearletha Furl, MD  triamcinolone (NASACORT ALLERGY 24HR) 55 MCG/ACT AERO nasal inhaler Place 1 spray into the nose as needed (Congestion).   Yes [provider]    Family History No family history on file.  Social History Social History  Substance Use Topics  . Smoking status: Never Smoker  . Smokeless tobacco: Never Used  . Alcohol use 0.0 oz/week     Comment: occasional/fim     Allergies   Patient has no known allergies.   Review of Systems Review of Systems  Constitutional: Negative for chills and fever.  HENT: Negative for ear pain and sore throat.   Eyes: Negative for pain and visual disturbance.  Respiratory: Negative for cough and shortness of breath.   Cardiovascular: Negative for chest pain and  palpitations.  Gastrointestinal: Negative for abdominal pain and vomiting.  Genitourinary: Negative for dysuria and hematuria.  Musculoskeletal: Negative for arthralgias and back pain.  Skin: Negative for color change and rash.  Neurological: Negative for seizures and syncope.  All other systems reviewed and are negative.    Physical Exam Updated Vital Signs BP 129/83   Pulse 60   Temp 98.3 F (36.8 C) (Oral)   Resp 16   SpO2 100%   Physical Exam  Constitutional: She appears well-developed and well-nourished. No distress.  HENT:  Head: Normocephalic and atraumatic.  Eyes: Conjunctivae are normal.  Neck: Neck supple.  Cardiovascular: Regular rhythm.   No murmur heard. Tachycardic  Pulmonary/Chest: Effort normal and breath sounds normal. No respiratory distress.  Abdominal: Soft. There is no tenderness.  Musculoskeletal: She exhibits no edema.  Neurological: She is alert.  Blurry vision L>R Bilat facial numbness (CNV1-3) L arm numbness Strength 5/5 Bilat UE & LE  Skin: Skin is warm and dry.  Psychiatric: She has a normal mood and affect.  Nursing note and vitals reviewed.   ED Treatments / Results  Labs (all labs ordered are listed, but only abnormal results are displayed) Labs Reviewed  CBC  BASIC METABOLIC PANEL  URINALYSIS, ROUTINE W REFLEX MICROSCOPIC    EKG  EKG Interpretation None       Radiology No results found.  Procedures Procedures (including critical care time)  Medications Ordered in ED Medications  ketorolac (TORADOL) 30 MG/ML injection 30 mg (not administered)  metoCLOPramide (REGLAN) injection 10 mg (not administered)  sodium chloride 0.9 % bolus 1,000 mL (not administered)     Initial Impression / Assessment and Plan / ED Course  I have reviewed the triage vital signs and the nursing notes.  Pertinent labs & imaging results that were available during my care of the patient were reviewed by me and considered in my medical  decision making (see chart for details).    Patient with history of MS presenting with facial numbness, vision changes, and apparent intermittant proprioceptive deficits. - Neurology Consult: Potential new facial symptoms and possible worsening of old symptoms.   In consultation with neurology. Will get MR Brain w & wo. Will do infectious rule out and give migraine cocktail (IVF, toradol, reglan) - CBC: - CMP: - U/A: - CXR: - MRI Brain:   Final Clinical Impressions(s) / ED Diagnoses   Final diagnoses:  None    New Prescriptions New Prescriptions   No medications on file     Beola Cord, MD 09/08/16 1642    Beola Cord, MD 09/08/16 1753    Gerhard Munch, MD 09/11/16 2149

## 2016-09-08 NOTE — Discharge Instructions (Signed)
Your MRI is reassuring and does not show signs of MS flair or new lesions. Please follow-up with your neurologist for ongoing management. Return without fail for any worsening symptoms.

## 2016-09-10 ENCOUNTER — Other Ambulatory Visit: Payer: Self-pay | Admitting: Neurology

## 2016-09-11 ENCOUNTER — Encounter: Payer: Self-pay | Admitting: Neurology

## 2016-09-12 ENCOUNTER — Ambulatory Visit (INDEPENDENT_AMBULATORY_CARE_PROVIDER_SITE_OTHER): Payer: BLUE CROSS/BLUE SHIELD | Admitting: Neurology

## 2016-09-12 ENCOUNTER — Encounter: Payer: Self-pay | Admitting: Neurology

## 2016-09-12 ENCOUNTER — Telehealth: Payer: Self-pay | Admitting: Neurology

## 2016-09-12 VITALS — BP 120/79 | HR 116 | Wt 181.5 lb

## 2016-09-12 DIAGNOSIS — Z79899 Other long term (current) drug therapy: Secondary | ICD-10-CM | POA: Diagnosis not present

## 2016-09-12 DIAGNOSIS — G43009 Migraine without aura, not intractable, without status migrainosus: Secondary | ICD-10-CM | POA: Diagnosis not present

## 2016-09-12 DIAGNOSIS — R5383 Other fatigue: Secondary | ICD-10-CM

## 2016-09-12 DIAGNOSIS — R269 Unspecified abnormalities of gait and mobility: Secondary | ICD-10-CM | POA: Diagnosis not present

## 2016-09-12 DIAGNOSIS — F418 Other specified anxiety disorders: Secondary | ICD-10-CM | POA: Diagnosis not present

## 2016-09-12 DIAGNOSIS — G35 Multiple sclerosis: Secondary | ICD-10-CM | POA: Diagnosis not present

## 2016-09-12 DIAGNOSIS — F988 Other specified behavioral and emotional disorders with onset usually occurring in childhood and adolescence: Secondary | ICD-10-CM | POA: Diagnosis not present

## 2016-09-12 DIAGNOSIS — H469 Unspecified optic neuritis: Secondary | ICD-10-CM | POA: Diagnosis not present

## 2016-09-12 DIAGNOSIS — G35A Relapsing-remitting multiple sclerosis: Secondary | ICD-10-CM

## 2016-09-12 MED ORDER — TRAZODONE HCL 50 MG PO TABS: 50.0000 mg | ORAL_TABLET | Freq: Every day | ORAL | 11 refills | Status: DC

## 2016-09-12 MED ORDER — AMPHETAMINE-DEXTROAMPHET ER 30 MG PO CP24: 30.0000 mg | ORAL_CAPSULE | Freq: Every day | ORAL | 0 refills | Status: DC

## 2016-09-12 NOTE — Telephone Encounter (Signed)
Pt called today said it looks like there is a film over her eye and it is very sore, left side of face is numb and tingle. She is calling the clinic as instructed via email by Dr Epimenio Foot yesterday. Please call

## 2016-09-12 NOTE — Telephone Encounter (Signed)
Rn spoke with patient that Dr ,Epimenio Foot can see her at 0200pm. He put appt for 1200pm but have her check inat 0149pm.Patinet having left sided numbness and eye pain, Pt will be seen at 0200pm, Rn told pt to check in at 0145pm, pt aware she is being work in, he can see her at 0200 but could only put appt at 1200pm km

## 2016-09-12 NOTE — Progress Notes (Signed)
[facility]  PATIENT: [name] DOB: [birth year]  REFERRING DOCTOR OR PCP:  Dr. Cristopher Peru  [facility]) SOURCE: Patient, a couple MRI reports and images on PACS. Lab reports, notes from Dr. Clelia Croft  _________________________________   HISTORICAL  CHIEF COMPLAINT:  Chief Complaint  Patient presents with  . Follow-up    Left side is numb and tingling schedule per Dr. Epimenio Foot    HISTORY OF PRESENT ILLNESS:  [name] is a 36 year old woman with relapsing remitting multiple sclerosis.     MS:   She had an exacerbation in 2017 and early 2018 switch to ocrelizumab with her first doses in May 2018 (was on Gilenya).   She had another small exacerbation in July (right leg stiff/weak) and she received 3 days of IV site.  She went from using a cane to a walker for balance.  She tolerates the ocrelizumab well.  Over the weekend, she had the onset of left eye pain and left sided weakness and clumsiness.     She plays piano and her left hand isn't cooperating well.     HA:   Headaches have done better on Topamax. She now is just having a couple migraines a month.   When present, they are located in the right forehead and sometimes also the right occiput.   Headaches occur at least once a week. Pain is throbbing.   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    When intense, she also has photophobia and phonophobia.   Moving worsens the pain. She takes NSAIDs with mild benefit..    Gait/strength/sensatoin:  She continues to use a walker for her gait. She had an exacerbation with right leg weakness last year and currently is having difficulties with the left leg greater than left arm. Currently, her left leg seems weaker to her than the right. She had more difficulty using a cane. She notes a left foot drop. She has had difficulty playing cannulated part due to the hand  having decreased coordination   Gait worsenend with the exacerbation in 2017 but is better now than earlier this year.   She was walking without a cane last year.    Baclofen has helped the spasticity slightl.   There is some allodynia in the legs at times..  Vertigo:   Vertigo is better.   She sometimes finds that she veers to the right while she walks.   Bladder:   Her bladder function is doing better with oxybutynin and she has less urgency and nocturia.   Vision: She notes reduced vision out of the left eye. She denies diplopia..  She denies vertigo.  Fatigue/sleep: She has more physical and cognitive fatigue. She wakes up tired and gets more tired later in the day.     Sometimes she is mildly sleepy.      The higher dose of Adderall is only helping a bit.   She is on Adderall XR 30 mg in the am and 10 mg IR at 1 pm.  She is having trouble falling asleep since symptoms worsened    Mood/cognition: She feels more depression and notes a lot of of frustration with her physical changes over the last year.  She sees [employer] [facility].   She also has noted difficulty with cognitive skills over the past couple of years.     Her  mom has MS and her MS stabilized after a bone marrow transplant for cancer.  MS history:   In late June or early July 2011, she had the onset of numbness that went up to her belly button. She presented to the emergency room. An MRI of the brain showed 1 periventricular focus with maybe a second subtle periventricular focus. An MRI of the cervical spine showed an enhancing lesion adjacent to C3-C4. The diagnosis of possible MS was made but the evidence was not enough to begin a disease modifying therapy. In 2015, she had the onset of similar symptoms and also had decreased ability to use the left hand. An MRI of the brain at that time showed several new lesions not present in 2011, including a focus in the right middle cerebellar peduncle additionally, she had a lumbar puncture consistent  with MS. Because of her symptoms, MRI changes and lumbar puncture results, she was diagnosed with clinical definite relapsing remitting MS and was started on Tecfidera. She has continued on Tecfidera. Over the past few months she has noted more difficulty with focus and concentration.     She also has had some change in vision more recently. Because of these newer symptoms, and MRI was repeated 04/27/2015. The MRI of the cervical spine showed no new lesions. though the MRI of the brain showed 2  lesions not present in 2015, one in the left juxtacortical white matter and one in the left deep white matter.  She had a small exacerbation in August 2017 and received IV SOlu-Medrol.   MRI cervical spine 2017 an MRI of the brain show two spinal plaques, a definite rightt middle cerebellar peduncle plaque and a possible right pontine plaque and several foci in the hemispheres  MRI 01/2016 showed no new lesions.  She is JCV Ab positive.      REVIEW OF SYSTEMS: Constitutional: No fevers, chills, sweats, or change in appetite.  She has fatigue and insomnia. Eyes: No visual changes, double vision, eye pain Ear, nose and throat: No hearing loss, ear pain, nasal congestion, sore throat Cardiovascular: No chest pain, palpitations Respiratory: No shortness of breath at rest or with exertion.   No wheezes GastrointestinaI: No nausea, vomiting, diarrhea, abdominal pain, fecal incontinence Genitourinary:   She notes frequency, some hesitancy at times and nocturia. Musculoskeletal: No neck pain, back pain Integumentary: No rash, pruritus, skin lesions Neurological: as above Psychiatric: as above Endocrine: No palpitations, diaphoresis, change in appetite, change in weigh or increased thirst Hematologic/Lymphatic: No anemia, purpura, petechiae. Allergic/Immunologic: No itchy/runny eyes, nasal congestion, recent allergic reactions, rashes  ALLERGIES: No Known Allergies  HOME MEDICATIONS:  Current Outpatient  Prescriptions:  .  amphetamine-dextroamphetamine (ADDERALL XR) 30 MG 24 hr capsule, Take 1 capsule (30 mg total) by mouth daily., Disp: 30 capsule, Rfl: 0 .  amphetamine-dextroamphetamine (ADDERALL) 10 MG tablet, Take one or two po in the aftrernoon, Disp: 60 tablet, Rfl: 0 .  baclofen (LIORESAL) 10 MG tablet, Take 1 tablet (10 mg total) by mouth 3 (three) times daily as needed for muscle spasms., Disp: 270 each, Rfl: 1 .  Cholecalciferol (VITAMIN D-1000 MAX ST) 1000 units tablet, Take 5,000 Units by mouth daily. , Disp: , Rfl:  .  Menthol, Topical Analgesic, (BIOFREEZE EX), Apply 1 application topically as needed (Pain)., Disp: , Rfl:  .  ocrelizumab 600 mg in sodium chloride 0.9 % 500 mL, Inject 600 mg into the vein every 6 (six) months., Disp: , Rfl:  .  oxybutynin (DITROPAN-XL) 10  MG 24 hr tablet, TAKE 1 TABLET (10 MG TOTAL) BY MOUTH AT BEDTIME., Disp: 30 tablet, Rfl: 5 .  sertraline (ZOLOFT) 100 MG tablet, , Disp: , Rfl: 0 .  topiramate (TOPAMAX) 100 MG tablet, Take 1 tablet (100 mg total) by mouth at bedtime., Disp: 30 tablet, Rfl: 5 .  traZODone (DESYREL) 50 MG tablet, Take 1 tablet (50 mg total) by mouth at bedtime., Disp: 30 tablet, Rfl: 11 .  triamcinolone (NASACORT ALLERGY 24HR) 55 MCG/ACT AERO nasal inhaler, Place 1 spray into the nose as needed (Congestion)., Disp: , Rfl:   PAST MEDICAL HISTORY: Past Medical History:  Diagnosis Date  . MS (multiple sclerosis) (HCC)     PAST SURGICAL HISTORY: No past surgical history on file.  FAMILY HISTORY: No family history on file.  SOCIAL HISTORY:  Social History   Social History  . Marital status: Divorced    Spouse name: N/A  . Number of children: N/A  . Years of education: N/A   Occupational History  . Not on file.   Social History Main Topics  . Smoking status: Never Smoker  . Smokeless tobacco: Never Used  . Alcohol use 0.0 oz/week     Comment: occasional/fim  . Drug use: No  . Sexual activity: Not on file   Other  Topics Concern  . Not on file   Social History Narrative  . No narrative on file     PHYSICAL EXAM  Vitals:   09/12/16 1357  BP: 120/79  Pulse: (!) 116  Weight: 181 lb 8 oz (82.3 kg)    Body mass index is 28.43 kg/m.   General: The patient is well-developed and well-nourished and in no acute distress   HEENT:    [place]/AT, fundoscopic exam is normal.   VA is 20/20 OD and 20/30-2 OS1  Skin:  No rashes or edema  Musculoskeletal:    The neck is nontender with a good range of motion.  Neurologic Exam  Mental status: The patient is alert and oriented x 3 at the time of the examination. The patient has apparent normal recent and remote memory, with an apparently normal attention span and concentration ability.   Speech is normal.  Cranial nerves: Extraocular movements are full.  Facial strength and sensation is normal. Trapezius strength is normal.  The tongue is midline, and the patient has symmetric elevation of the soft palate. No obvious hearing deficits are noted.  Motor:  Muscle bulk is normal.   Tone is mildly increased in legs, bilaterally fairly equally.  Strength is  5 / 5 in all 4 extremities except 4+/5 in the feet.   Sensory: Sensory testing shows reduced left vibration sensation and reduced right temperature sensation   She notes reduced right leg vibratory sensation  .  Coordination: Cerebellar testing reveals good finger-nose-finger and reduced heel-to-shin, left worse than right.  Gait and station: Station is normal.   Gait shows a left > right foot drop.   She cannot tandem walk Romberg is borderline.   Reflexes: Deep tendon reflexes are symmetric and increased in her legs with spread at the knees bilaterally.         DIAGNOSTIC DATA (LABS, IMAGING, TESTING) - I reviewed patient records, labs, notes, testing and imaging myself where available.  Lab Results  Component Value Date   WBC 9.3 09/08/2016   HGB 13.9 09/08/2016   HCT 40.2 09/08/2016   MCV 82.5  09/08/2016   PLT 265 09/08/2016      Component  Value Date/Time   NA 139 09/08/2016 1855   NA 137 08/14/2016 1332   NA 141 07/28/2013 0410   K 3.2 (L) 09/08/2016 1855   K 3.7 07/28/2013 0410   CL 108 09/08/2016 1855   CL 103 07/28/2013 0410   CO2 22 09/08/2016 1855   CO2 29 07/28/2013 0410   GLUCOSE 98 09/08/2016 1855   GLUCOSE 121 (H) 07/28/2013 0410   BUN 8 09/08/2016 1855   BUN 8 08/14/2016 1332   BUN 16 07/28/2013 0410   CREATININE 0.76 09/08/2016 1855   CREATININE 0.66 07/28/2013 0410   CALCIUM 8.7 (L) 09/08/2016 1855   CALCIUM 8.6 07/28/2013 0410   PROT 6.9 08/14/2016 1332   PROT 6.7 07/25/2013 0410   ALBUMIN 4.4 08/14/2016 1332   ALBUMIN 3.3 (L) 07/25/2013 0410   AST 15 08/14/2016 1332   AST 20 07/25/2013 0410   ALT 17 08/14/2016 1332   ALT 18 07/25/2013 0410   ALKPHOS 85 08/14/2016 1332   ALKPHOS 49 07/25/2013 0410   BILITOT 0.4 08/14/2016 1332   BILITOT 0.4 07/25/2013 0410   GFRNONAA >60 09/08/2016 1855   GFRNONAA >60 07/28/2013 0410   GFRAA >60 09/08/2016 1855   GFRAA >60 07/28/2013 0410       ASSESSMENT AND PLAN  Relapsing remitting multiple sclerosis (HCC)  High risk medication use  Gait disturbance  Depression with anxiety  Other fatigue  Attention deficit disorder, unspecified hyperactivity presence  Optic neuritis  Migraine without aura and without status migrainosus, not intractable    1.   She appears to have an exacerbation with left optic neuritis and also seems to be a little worse on her left side and at the last visit. IV Solu-Medrol (one gram) today and tomorrow..  She returns for her next ocrelizumab in November 2.   Continue the Adderall 30 mg XR in the morning with 10 mg as needed later in the day for her reduced attention. 3.  Continue topiramate to 100 mg for migraines. This may also help weight loss.  4.   Continue other medications for her MS symptoms. Return to clinic in 3-4 months or sooner if there are new or  worsening neurologic symptoms.   40 minutes face-to-face evaluation with greater than one half of the time counseling and coordinating care about her worsening MS symptoms and exacerbation.   Destry Dauber A. Epimenio Foot, MD, PhD 09/12/2016, 6:45 PM Certified in Neurology, Clinical Neurophysiology, Sleep Medicine, Pain Medicine and Neuroimaging  [facility] [address], [address] [place], [place] [address] [phone] o

## 2016-09-23 ENCOUNTER — Ambulatory Visit: Payer: Self-pay | Admitting: Neurology

## 2016-10-14 ENCOUNTER — Telehealth: Payer: Self-pay | Admitting: Neurology

## 2016-10-14 MED ORDER — AMPHETAMINE-DEXTROAMPHET ER 30 MG PO CP24: 30.0000 mg | ORAL_CAPSULE | Freq: Every day | ORAL | 0 refills | Status: DC

## 2016-10-14 NOTE — Telephone Encounter (Signed)
Rx. awaiting RAS sig/fim 

## 2016-10-14 NOTE — Telephone Encounter (Signed)
Pt is asking for a call back, she states she was never told if she has optic neuritis or not.  Pt states she is feeling progressively worse as the day progresses.  Her entire left side is numb on face especially around her left eye and hand.  Pt states that her vision is not right but most of the time it is.  Pt advised to use ED if needed

## 2016-10-14 NOTE — Telephone Encounter (Signed)
Pt calling for refill of amphetamine-dextroamphetamine (ADDERALL XR) 30 MG 24 hr capsule °

## 2016-10-14 NOTE — Addendum Note (Signed)
Addended by: Candis Schatz I on: 10/14/2016 10:37 AM   Modules accepted: Orders

## 2016-10-14 NOTE — Telephone Encounter (Signed)
Rx. up front [facility]/fim 

## 2016-10-15 NOTE — Telephone Encounter (Signed)
Spoke with [place] again.  RAS would like to see her in the office.  She will keep 9am appt. tomorrow/fim

## 2016-10-15 NOTE — Telephone Encounter (Signed)
Spoke with [employer].  She c/o continued visual disturbance--bright dots in vision bilat. Vision hazy, unfocused.  Then clears. Numbness from left buttock to left shoulder, right side of back feels stiff. Last MRI brain 09/18/16, MRI c-spine 04/04/16, t-spine in 2015.  Pt. just seen last month and sx. she is having now are same she was having then, just have been intermittent and now starting to worsen again. Will check with RAS about having IV steroids vs ov to re-eval and call pt. back/fim

## 2016-10-16 ENCOUNTER — Ambulatory Visit (INDEPENDENT_AMBULATORY_CARE_PROVIDER_SITE_OTHER): Payer: BLUE CROSS/BLUE SHIELD | Admitting: Neurology

## 2016-10-16 ENCOUNTER — Encounter: Payer: Self-pay | Admitting: *Deleted

## 2016-10-16 ENCOUNTER — Encounter: Payer: Self-pay | Admitting: Neurology

## 2016-10-16 VITALS — BP 115/68 | HR 85 | Resp 18 | Ht 67.0 in | Wt 177.0 lb

## 2016-10-16 DIAGNOSIS — H469 Unspecified optic neuritis: Secondary | ICD-10-CM | POA: Diagnosis not present

## 2016-10-16 DIAGNOSIS — R269 Unspecified abnormalities of gait and mobility: Secondary | ICD-10-CM | POA: Diagnosis not present

## 2016-10-16 DIAGNOSIS — R5383 Other fatigue: Secondary | ICD-10-CM

## 2016-10-16 DIAGNOSIS — G43009 Migraine without aura, not intractable, without status migrainosus: Secondary | ICD-10-CM

## 2016-10-16 DIAGNOSIS — G35 Multiple sclerosis: Secondary | ICD-10-CM

## 2016-10-16 MED ORDER — BACLOFEN 10 MG PO TABS
10.0000 mg | ORAL_TABLET | Freq: Three times a day (TID) | ORAL | 1 refills | Status: DC | PRN
Start: 2016-10-16 — End: 2017-07-08

## 2016-10-16 NOTE — Progress Notes (Signed)
[facility]  PATIENT: [name] DOB: 1980/11/05  REFERRING DOCTOR OR PCP:  Dr. Cristopher Peru  [facility]) SOURCE: Patient, a couple MRI reports and images on PACS. Lab reports, notes from Dr. Clelia Croft  _________________________________   HISTORICAL  CHIEF COMPLAINT:  Chief Complaint  Patient presents with  . Multiple Sclerosis    Next Ocrevus infusion sched. for 12/20/16.  Sts. she continues to have bright dots in vision bilat, vision seems hazy, unfocused.  Sts. eyes are sore. Generalized weakness, using a walker.  More trouble with memory.  She feels Adderall helps focus but she often forgets the afternoon dose/fim    HISTORY OF PRESENT ILLNESS:  [name] is a 36 year old woman with relapsing remitting multiple sclerosis.   She is noting continued issues with weakness and vision.    MS:   She had an exacerbation in 2017 and early 2018 switch to ocrelizumab with her first doses in May 2018 (was on Gilenya).   She had another small exacerbation in July (right leg stiff/weak) and she received 3 days of IV site.  She went from using a cane to a walker for balance.  She tolerates the ocrelizumab well.     Gait/strength/sensatoin:  She is using a walker for long distances but is able to walk shorter distances without it.   Her right leg is weaker than her left and gets stiff.   It is painful when stiff.  Her foot drags on the right and catches the floor causing a stumble.   Gait worsenend with the exacerbation in 2017.  She was walking without a cane last year.    Baclofen has helped the spasticity slightly.   There is some allodynia in the legs at times and also the right arm..  We discussed Ampyra for her walking  Vision: She has numbness around her left eye and she feels there is reduced left vision with dots and squiggles in the vision.  The left eye also hurts, worse with movements.. She denies diplopia..  She denies vertigo.  HA:   Migraine headaches have done  better on Topamax.  It does cause a dry mouth and change in taste. She now is just having a couple migraines a month.   When present, they are located in the right forehead and sometimes also the right occiput.   When present, pain is throbbing.    When intense, she also has photophobia and phonophobia.   Moving worsens the pain. She takes NSAIDs with mild benefit..    Bladder:   She is doing better, oxybutynin has helped.   Fatigue/sleep: She has physical and cognitive fatigue. This starts in the mornings but is worse later in the day and if she gets hot. Sometimes she also feels sleepy. Adderall is helping some.   The higher dose of Adderall is only helping a bit.   She is on Adderall XR 30 mg in the am and 10 mg IR at 1 pm.  She sometimes has sleep onset and sleep maintenance insomnia.   Mood/cognition: She notes some depression and frustration.  She sees [facility].   She also has noted difficulty with cognitive skills over the past couple of years.     Her mom has MS and her MS stabilized after a bone marrow transplant for cancer.  MS history:   In late June or early July 2011, she had the onset of numbness that went up to her belly button. She presented to the emergency  room. An MRI of the brain showed 1 periventricular focus with maybe a second subtle periventricular focus. An MRI of the cervical spine showed an enhancing lesion adjacent to C3-C4. The diagnosis of possible MS was made but the evidence was not enough to begin a disease modifying therapy. In 2015, she had the onset of similar symptoms and also had decreased ability to use the left hand. An MRI of the brain at that time showed several new lesions not present in 2011, including a focus in the right middle cerebellar peduncle additionally, she had a lumbar puncture consistent with MS. Because of her symptoms, MRI changes and lumbar puncture results, she was diagnosed with clinical definite relapsing remitting MS and was started on  Tecfidera. She has continued on Tecfidera. Over the past few months she has noted more difficulty with focus and concentration.     She also has had some change in vision more recently. Because of these newer symptoms, and MRI was repeated 04/27/2015. The MRI of the cervical spine showed no new lesions. though the MRI of the brain showed 2  lesions not present in 2015, one in the left juxtacortical white matter and one in the left deep white matter.  She had a small exacerbation in August 2017 and received IV SOlu-Medrol.   MRI cervical spine 2017 an MRI of the brain show two spinal plaques, a definite rightt middle cerebellar peduncle plaque and a possible right pontine plaque and several foci in the hemispheres  MRI 01/2016 showed no new lesions.  She is JCV Ab positive.      REVIEW OF SYSTEMS: Constitutional: No fevers, chills, sweats, or change in appetite.  She has fatigue and insomnia. Eyes: No visual changes, double vision, eye pain Ear, nose and throat: No hearing loss, ear pain, nasal congestion, sore throat Cardiovascular: No chest pain, palpitations Respiratory: No shortness of breath at rest or with exertion.   No wheezes GastrointestinaI: No nausea, vomiting, diarrhea, abdominal pain, fecal incontinence Genitourinary:   She notes frequency, some hesitancy at times and nocturia. Musculoskeletal: No neck pain, back pain Integumentary: No rash, pruritus, skin lesions Neurological: as above Psychiatric: as above Endocrine: No palpitations, diaphoresis, change in appetite, change in weigh or increased thirst Hematologic/Lymphatic: No anemia, purpura, petechiae. Allergic/Immunologic: No itchy/runny eyes, nasal congestion, recent allergic reactions, rashes  ALLERGIES: No Known Allergies  HOME MEDICATIONS:  Current Outpatient Prescriptions:  .  amphetamine-dextroamphetamine (ADDERALL XR) 30 MG 24 hr capsule, Take 1 capsule (30 mg total) by mouth daily., Disp: 30 capsule, Rfl: 0 .   amphetamine-dextroamphetamine (ADDERALL) 10 MG tablet, Take one or two po in the aftrernoon, Disp: 60 tablet, Rfl: 0 .  baclofen (LIORESAL) 10 MG tablet, Take 1 tablet (10 mg total) by mouth 3 (three) times daily as needed for muscle spasms., Disp: 270 each, Rfl: 1 .  Cholecalciferol (VITAMIN D-1000 MAX ST) 1000 units tablet, Take 5,000 Units by mouth daily. , Disp: , Rfl:  .  Menthol, Topical Analgesic, (BIOFREEZE EX), Apply 1 application topically as needed (Pain)., Disp: , Rfl:  .  ocrelizumab 600 mg in sodium chloride 0.9 % 500 mL, Inject 600 mg into the vein every 6 (six) months., Disp: , Rfl:  .  oxybutynin (DITROPAN-XL) 10 MG 24 hr tablet, TAKE 1 TABLET (10 MG TOTAL) BY MOUTH AT BEDTIME., Disp: 30 tablet, Rfl: 5 .  sertraline (ZOLOFT) 100 MG tablet, , Disp: , Rfl: 0 .  topiramate (TOPAMAX) 100 MG tablet, Take 1 tablet (100 mg total)  by mouth at bedtime., Disp: 30 tablet, Rfl: 5 .  traZODone (DESYREL) 50 MG tablet, Take 1 tablet (50 mg total) by mouth at bedtime., Disp: 30 tablet, Rfl: 11 .  triamcinolone (NASACORT ALLERGY 24HR) 55 MCG/ACT AERO nasal inhaler, Place 1 spray into the nose as needed (Congestion)., Disp: , Rfl:   PAST MEDICAL HISTORY: Past Medical History:  Diagnosis Date  . MS (multiple sclerosis) (HCC)     PAST SURGICAL HISTORY: No past surgical history on file.  FAMILY HISTORY: No family history on file.  SOCIAL HISTORY:  Social History   Social History  . Marital status: Divorced    Spouse name: N/A  . Number of children: N/A  . Years of education: N/A   Occupational History  . Not on file.   Social History Main Topics  . Smoking status: Never Smoker  . Smokeless tobacco: Never Used  . Alcohol use 0.0 oz/week     Comment: occasional/fim  . Drug use: No  . Sexual activity: Not on file   Other Topics Concern  . Not on file   Social History Narrative  . No narrative on file     PHYSICAL EXAM  Vitals:   10/16/16 0920  BP: 115/68  Pulse: 85    Resp: 18  Weight: 177 lb (80.3 kg)  Height: 5\' 7"  (1.702 m)    Body mass index is 27.72 kg/m.   General: The patient is well-developed and well-nourished and in no acute distress   HEENT:    [place]/AT, fundoscopic exam is normal.       Musculoskeletal:    The neck is nontender with a good range of motion.  Neurologic Exam  Mental status: The patient is alert and oriented x 3 at the time of the examination. The patient has apparent normal recent and remote memory, with an apparently normal attention span and concentration ability.   Speech is normal.  Cranial nerves: Extraocular movements are full.  Facial strength is normal but mild reduced left touch/temp sensation Trapezius strength is normal.  The tongue is midline, and the patient has symmetric elevation of the soft palate. No obvious hearing deficits are noted.  Motor:  Muscle bulk is normal.   Tone is mildly increased in legs, bilaterally fairly equally.  Strength is  5 / 5 in all 4 extremities except 4+/5 in the feet.   Sensory: Sensory testing shows reduced right leg vibration sensation and reduced left arm/leg touch and temperature.    Coordination: Cerebellar testing reveals good finger-nose-finger and reduced heel-to-shin, left worse than right.  Gait and station: Station is normal.   She has a mild right foot drop..   She cannot tandem walk and Romberg is borderline.   Reflexes: Deep tendon reflexes are symmetric and increased in her legs.  She has spread at the knees          DIAGNOSTIC DATA (LABS, IMAGING, TESTING) - I reviewed patient records, labs, notes, testing and imaging myself where available.  Lab Results  Component Value Date   WBC 9.3 09/08/2016   HGB 13.9 09/08/2016   HCT 40.2 09/08/2016   MCV 82.5 09/08/2016   PLT 265 09/08/2016      Component Value Date/Time   NA 139 09/08/2016 1855   NA 137 08/14/2016 1332   NA 141 07/28/2013 0410   K 3.2 (L) 09/08/2016 1855   K 3.7 07/28/2013 0410   CL  108 09/08/2016 1855   CL 103 07/28/2013 0410   CO2  22 09/08/2016 1855   CO2 29 07/28/2013 0410   GLUCOSE 98 09/08/2016 1855   GLUCOSE 121 (H) 07/28/2013 0410   BUN 8 09/08/2016 1855   BUN 8 08/14/2016 1332   BUN 16 07/28/2013 0410   CREATININE 0.76 09/08/2016 1855   CREATININE 0.66 07/28/2013 0410   CALCIUM 8.7 (L) 09/08/2016 1855   CALCIUM 8.6 07/28/2013 0410   PROT 6.9 08/14/2016 1332   PROT 6.7 07/25/2013 0410   ALBUMIN 4.4 08/14/2016 1332   ALBUMIN 3.3 (L) 07/25/2013 0410   AST 15 08/14/2016 1332   AST 20 07/25/2013 0410   ALT 17 08/14/2016 1332   ALT 18 07/25/2013 0410   ALKPHOS 85 08/14/2016 1332   ALKPHOS 49 07/25/2013 0410   BILITOT 0.4 08/14/2016 1332   BILITOT 0.4 07/25/2013 0410   GFRNONAA >60 09/08/2016 1855   GFRNONAA >60 07/28/2013 0410   GFRAA >60 09/08/2016 1855   GFRAA >60 07/28/2013 0410       ASSESSMENT AND PLAN  Relapsing remitting multiple sclerosis (HCC)  Optic neuritis  Migraine without aura and without status migrainosus, not intractable  Gait disturbance  Other fatigue    1.   Ampyra 10 mg bid to help with ambulation 2.   Continue the Adderall 30 mg XR in the morning with 10 mg as needed later in the day for her reduced attention. 3.  Continue topiramate to 100 mg for migraines. If headaches continue to do well and the tolerability continues to be a problem, consider reducing the dose. 4.   Continue other medications for her MS symptoms. Return to clinic in 3-4 months or sooner if there are new or worsening neurologic symptoms.   Eriverto Byrnes A. Epimenio Foot, MD, PhD 10/16/2016, 10:46 AM Certified in Neurology, Clinical Neurophysiology, Sleep Medicine, Pain Medicine and Neuroimaging  [facility] [address], [address] [place], [place] [address] [phone] olll

## 2016-10-23 ENCOUNTER — Telehealth: Payer: Self-pay | Admitting: *Deleted

## 2016-10-23 NOTE — Telephone Encounter (Signed)
PA for Ampyra 10mg  #60/30 tablets completed via Cover My Meds.  Approved.  Key# [id number]

## 2016-11-05 ENCOUNTER — Telehealth: Payer: Self-pay | Admitting: *Deleted

## 2016-11-05 MED ORDER — TRAZODONE HCL 50 MG PO TABS: 50.0000 mg | ORAL_TABLET | Freq: Every day | ORAL | 3 refills | Status: DC

## 2016-11-05 NOTE — Telephone Encounter (Signed)
Rx. changed to 90 day rx. per faxed request from [facility]/fim

## 2016-11-11 ENCOUNTER — Encounter: Payer: Self-pay | Admitting: Neurology

## 2016-12-10 ENCOUNTER — Other Ambulatory Visit: Payer: Self-pay | Admitting: Neurology

## 2016-12-12 ENCOUNTER — Telehealth: Payer: Self-pay | Admitting: Neurology

## 2016-12-12 NOTE — Telephone Encounter (Signed)
Kristen from [facility] called to see if it is okay to send generic for Ampyra  To patient, please call [phone].  They are also waiting to hear from Pt if she will accept the generic

## 2016-12-12 NOTE — Telephone Encounter (Signed)
I spoke with Crystal at [facility] and advised ok for generic Ampyra/fim

## 2017-01-15 ENCOUNTER — Other Ambulatory Visit: Payer: Self-pay | Admitting: Neurology

## 2017-02-20 ENCOUNTER — Telehealth: Payer: Self-pay | Admitting: Neurology

## 2017-02-20 NOTE — Telephone Encounter (Signed)
Pt has an appt tomorrow 02/21/17 and is wanting to get a refill for amphetamine-dextroamphetamine (ADDERALL XR) 30 MG 24 hr capsule while she's here

## 2017-02-20 NOTE — Telephone Encounter (Signed)
Noted.  [facility] can r/f Adderall at pending appt. tomorrow/fim

## 2017-02-21 ENCOUNTER — Telehealth: Payer: Self-pay | Admitting: Neurology

## 2017-02-21 ENCOUNTER — Encounter: Payer: Self-pay | Admitting: Neurology

## 2017-02-21 ENCOUNTER — Ambulatory Visit: Payer: BLUE CROSS/BLUE SHIELD | Admitting: Neurology

## 2017-02-21 ENCOUNTER — Ambulatory Visit
Admission: RE | Admit: 2017-02-21 | Discharge: 2017-02-21 | Disposition: A | Discharge status: Home / Self Care | Payer: BLUE CROSS/BLUE SHIELD | Source: Ambulatory Visit | Attending: Neurology | Admitting: Neurology

## 2017-02-21 ENCOUNTER — Other Ambulatory Visit: Payer: Self-pay

## 2017-02-21 VITALS — BP 107/70 | HR 92 | Resp 18 | Ht 67.0 in | Wt 168.5 lb

## 2017-02-21 DIAGNOSIS — G35 Multiple sclerosis: Secondary | ICD-10-CM

## 2017-02-21 DIAGNOSIS — R269 Unspecified abnormalities of gait and mobility: Secondary | ICD-10-CM | POA: Diagnosis not present

## 2017-02-21 DIAGNOSIS — R053 Chronic cough: Secondary | ICD-10-CM

## 2017-02-21 DIAGNOSIS — R05 Cough: Secondary | ICD-10-CM | POA: Diagnosis not present

## 2017-02-21 DIAGNOSIS — R350 Frequency of micturition: Secondary | ICD-10-CM

## 2017-02-21 DIAGNOSIS — Z8669 Personal history of other diseases of the nervous system and sense organs: Secondary | ICD-10-CM | POA: Diagnosis not present

## 2017-02-21 DIAGNOSIS — Z79899 Other long term (current) drug therapy: Secondary | ICD-10-CM | POA: Diagnosis not present

## 2017-02-21 DIAGNOSIS — R5383 Other fatigue: Secondary | ICD-10-CM

## 2017-02-21 DIAGNOSIS — F418 Other specified anxiety disorders: Secondary | ICD-10-CM | POA: Diagnosis not present

## 2017-02-21 MED ORDER — AMPHETAMINE-DEXTROAMPHET ER 30 MG PO CP24: 30.0000 mg | ORAL_CAPSULE | Freq: Every day | ORAL | 0 refills | Status: DC

## 2017-02-21 NOTE — Progress Notes (Signed)
[facility]  PATIENT: [name] DOB: 07-03-1980  REFERRING DOCTOR OR PCP:  Dr. Cristopher Peru  [facility]) SOURCE: Patient, a couple MRI reports and images on PACS. Lab reports, notes from Dr. Clelia Croft  _________________________________   HISTORICAL  CHIEF COMPLAINT:  Chief Complaint  Patient presents with  . Multiple Sclerosis    Next Ocrevus due in May.  Currently being treated for a sinus infection/fim    HISTORY OF PRESENT ILLNESS:  [name] is a 37 year old woman with relapsing remitting multiple sclerosis.   [name] is noting continued issues with weakness and vision.    Update 02/21/2017: [name] is on Ocrevus and tolerates it well.  [name] has not had any exacerbations and feels a little bit better.  [name] feels gait is better on Ampyra.   [name] now Hardly ever uses a walker though [name] still has to touch the walls for balance. Speed is greatly improved. The right leg is weaker and more spastic than the left leg. He is having less leg pain. Vision has been stable.   [name] has urinary urgency and frequency that is helped by the oxybutynin. However,  [name] notes more dry mouth.   We discussed oxybutynin is most likely to cause this if [name] can change it to an as-needed basis.   Headaches are better on TPM 50 mg.  [name] tolerates that well. [name] was on 100 but was able to reduce the dose. [name] continues to note fatigue and attention deficit. Adderall has helped this. Mood has done better recently. [name] reports sleeping well most nights but less so since night time baclofen was held.  . [name] takes trazodone.  From 10/16/2016:  MS:   [name] had an exacerbation in 2017 and early 2018 switch to ocrelizumab with her first doses in May 2018 (was on Gilenya).   [name] had another small exacerbation in July (right leg stiff/weak) and [name] received 3 days of IV site.  [name] went from using a cane to a walker for balance.  [name] tolerates the ocrelizumab well.     Gait/strength/sensatoin:  [name] is using a  walker for long distances but is able to walk shorter distances without it.   Her right leg is weaker than her left and gets stiff.   It is painful when stiff.  Her foot drags on the right and catches the floor causing a stumble.   Gait worsenend with the exacerbation in 2017.  [name] was walking without a cane last year.    Baclofen has helped the spasticity slightly.   There is some allodynia in the legs at times and also the right arm..  We discussed Ampyra for her walking  Vision: [name] has numbness around her left eye and [name] feels there is reduced left vision with dots and squiggles in the vision.  The left eye also hurts, worse with movements.. [name] denies diplopia..  [name] denies vertigo.  HA:   Migraine headaches have done better on Topamax.  It does cause a dry mouth and change in taste. [name] now is just having a couple migraines a month.   When present, they are located in the right forehead and sometimes also the right occiput.   When present, pain is throbbing.    When intense, [name] also has photophobia and phonophobia.   Moving worsens the pain. [name] takes NSAIDs with mild benefit..    Bladder:   [name] is doing better, oxybutynin has helped.   Fatigue/sleep: [name] has physical and cognitive  fatigue. This starts in the mornings but is worse later in the day and if [name] gets hot. Sometimes [name] also feels sleepy. Adderall is helping some.   The higher dose of Adderall is only helping a bit.   [name] is on Adderall XR 30 mg in the am and 10 mg IR at 1 pm.  [name] sometimes has sleep onset and sleep maintenance insomnia.   Mood/cognition: [name] notes some depression and frustration.  [name] sees [facility].   [name] also has noted difficulty with cognitive skills over the past couple of years.     Her mom has MS and her MS stabilized after a bone marrow transplant for cancer.  MS history:   In late June or early July 2011, [name] had the onset of numbness that went up to her belly button. [name] presented to the emergency room.  An MRI of the brain showed 1 periventricular focus with maybe a second subtle periventricular focus. An MRI of the cervical spine showed an enhancing lesion adjacent to C3-C4. The diagnosis of possible MS was made but the evidence was not enough to begin a disease modifying therapy. In 2015, [name] had the onset of similar symptoms and also had decreased ability to use the left hand. An MRI of the brain at that time showed several new lesions not present in 2011, including a focus in the right middle cerebellar peduncle additionally, [name] had a lumbar puncture consistent with MS. Because of her symptoms, MRI changes and lumbar puncture results, [name] was diagnosed with clinical definite relapsing remitting MS and was started on Tecfidera. [name] has continued on Tecfidera. Over the past few months [name] has noted more difficulty with focus and concentration.     [name] also has had some change in vision more recently. Because of these newer symptoms, and MRI was repeated 04/27/2015. The MRI of the cervical spine showed no new lesions. though the MRI of the brain showed 2  lesions not present in 2015, one in the left juxtacortical white matter and one in the left deep white matter.  [name] had a small exacerbation in August 2017 and received IV SOlu-Medrol.   MRI cervical spine 2017 an MRI of the brain show two spinal plaques, a definite rightt middle cerebellar peduncle plaque and a possible right pontine plaque and several foci in the hemispheres  MRI 01/2016 showed no new lesions.  [name] is JCV Ab positive.      REVIEW OF SYSTEMS: Constitutional: No fevers, chills, sweats, or change in appetite.  [name] has fatigue and insomnia. Eyes: No visual changes, double vision, eye pain Ear, nose and throat: No hearing loss, ear pain, nasal congestion, sore throat Cardiovascular: No chest pain, palpitations Respiratory: No shortness of breath at rest or with exertion.   No wheezes GastrointestinaI: No nausea, vomiting, diarrhea,  abdominal pain, fecal incontinence Genitourinary:   [name] notes frequency, some hesitancy at times and nocturia. Musculoskeletal: No neck pain, back pain Integumentary: No rash, pruritus, skin lesions Neurological: as above Psychiatric: as above Endocrine: No palpitations, diaphoresis, change in appetite, change in weigh or increased thirst Hematologic/Lymphatic: No anemia, purpura, petechiae. Allergic/Immunologic: No itchy/runny eyes, nasal congestion, recent allergic reactions, rashes  ALLERGIES: No Known Allergies  HOME MEDICATIONS:  Current Outpatient Medications:  .  amphetamine-dextroamphetamine (ADDERALL XR) 30 MG 24 hr capsule, Take 1 capsule (30 mg total) by mouth daily., Disp: 30 capsule, Rfl: 0 .  amphetamine-dextroamphetamine (ADDERALL) 10 MG tablet, Take one or two po in the aftrernoon, Disp:  60 tablet, Rfl: 0 .  baclofen (LIORESAL) 10 MG tablet, Take 1 tablet (10 mg total) by mouth 3 (three) times daily as needed for muscle spasms., Disp: 270 each, Rfl: 1 .  benzonatate (TESSALON) 200 MG capsule, Take by mouth., Disp: , Rfl:  .  Cholecalciferol (VITAMIN D-1000 MAX ST) 1000 units tablet, Take 5,000 Units by mouth daily. , Disp: , Rfl:  .  dalfampridine 10 MG TB12, Take 10 mg by mouth 2 (two) times daily., Disp: , Rfl:  .  Menthol, Topical Analgesic, (BIOFREEZE EX), Apply 1 application topically as needed (Pain)., Disp: , Rfl:  .  ocrelizumab 600 mg in sodium chloride 0.9 % 500 mL, Inject 600 mg into the vein every 6 (six) months., Disp: , Rfl:  .  oxybutynin (DITROPAN-XL) 10 MG 24 hr tablet, TAKE 1 TABLET BY MOUTH AT BEDTIME, Disp: 30 tablet, Rfl: 5 .  sertraline (ZOLOFT) 100 MG tablet, , Disp: , Rfl: 0 .  topiramate (TOPAMAX) 100 MG tablet, TAKE 1 TABLET BY MOUTH EVERY DAY AT BEDTIME, Disp: 30 tablet, Rfl: 1 .  traZODone (DESYREL) 50 MG tablet, Take 1 tablet (50 mg total) by mouth at bedtime., Disp: 90 tablet, Rfl: 3 .  triamcinolone (NASACORT ALLERGY 24HR) 55 MCG/ACT AERO  nasal inhaler, Place 1 spray into the nose as needed (Congestion)., Disp: , Rfl:  .  azithromycin (ZITHROMAX) 250 MG tablet, , Disp: , Rfl: 0  PAST MEDICAL HISTORY: Past Medical History:  Diagnosis Date  . MS (multiple sclerosis) (HCC)     PAST SURGICAL HISTORY: No past surgical history on file.  FAMILY HISTORY: No family history on file.  SOCIAL HISTORY:  Social History   Socioeconomic History  . Marital status: Divorced    Spouse name: Not on file  . Number of children: Not on file  . Years of education: Not on file  . Highest education level: Not on file  Social Needs  . Financial resource strain: Not on file  . Food insecurity - worry: Not on file  . Food insecurity - inability: Not on file  . Transportation needs - medical: Not on file  . Transportation needs - non-medical: Not on file  Occupational History  . Not on file  Tobacco Use  . Smoking status: Never Smoker  . Smokeless tobacco: Never Used  Substance and Sexual Activity  . Alcohol use: Yes    Alcohol/week: 0.0 oz    Comment: occasional/fim  . Drug use: No  . Sexual activity: Not on file  Other Topics Concern  . Not on file  Social History Narrative  . Not on file     PHYSICAL EXAM  Vitals:   02/21/17 1125  BP: 107/70  Pulse: 92  Resp: 18  Weight: 168 lb 8 oz (76.4 kg)  Height: 5\' 7"  (1.702 m)    Body mass index is 26.39 kg/m.   General: The patient is well-developed and well-nourished and in no acute distress   HEENT:    [place]/AT, fundoscopic exam is normal.       Musculoskeletal:    The neck is nontender with a good range of motion.  Neurologic Exam  Mental status: The patient is alert and oriented x 3 at the time of the examination. The patient has apparent normal recent and remote memory, with an apparently normal attention span and concentration ability.   Speech is normal.  Cranial nerves: Extraocular movements are full.  Facial strength is normal but mild reduced left  touch/temp sensation  Trapezius strength is normal.  The tongue is midline, and the patient has symmetric elevation of the soft palate. No obvious hearing deficits are noted.  Motor:  Muscle bulk is normal.   Tone is mildly increased in legs, bilaterally fairly equally.  Strength is  5 / 5 in all 4 extremities except 4+/5 in the feet.   Sensory: Sensory testing shows mild reduced right leg vibration sensation and left leg pain sensation  Coordination: Cerebellar testing reveals good finger-nose-finger and reduced heel-to-shin, left worse than right.  Gait and station: Station is normal.  Her gait is mildly wide and [name] has a mild right foot drop. Tandem walking is difficult. The Romberg is negative..   Reflexes: Deep tendon reflexes are symmetric and increased in her legs. There is spread at the knees. No ankle clonus.          DIAGNOSTIC DATA (LABS, IMAGING, TESTING) - I reviewed patient records, labs, notes, testing and imaging myself where available.  Lab Results  Component Value Date   WBC 9.3 09/08/2016   HGB 13.9 09/08/2016   HCT 40.2 09/08/2016   MCV 82.5 09/08/2016   PLT 265 09/08/2016      Component Value Date/Time   NA 139 09/08/2016 1855   NA 137 08/14/2016 1332   NA 141 07/28/2013 0410   K 3.2 (L) 09/08/2016 1855   K 3.7 07/28/2013 0410   CL 108 09/08/2016 1855   CL 103 07/28/2013 0410   CO2 22 09/08/2016 1855   CO2 29 07/28/2013 0410   GLUCOSE 98 09/08/2016 1855   GLUCOSE 121 (H) 07/28/2013 0410   BUN 8 09/08/2016 1855   BUN 8 08/14/2016 1332   BUN 16 07/28/2013 0410   CREATININE 0.76 09/08/2016 1855   CREATININE 0.66 07/28/2013 0410   CALCIUM 8.7 (L) 09/08/2016 1855   CALCIUM 8.6 07/28/2013 0410   PROT 6.9 08/14/2016 1332   PROT 6.7 07/25/2013 0410   ALBUMIN 4.4 08/14/2016 1332   ALBUMIN 3.3 (L) 07/25/2013 0410   AST 15 08/14/2016 1332   AST 20 07/25/2013 0410   ALT 17 08/14/2016 1332   ALT 18 07/25/2013 0410   ALKPHOS 85 08/14/2016 1332   ALKPHOS  49 07/25/2013 0410   BILITOT 0.4 08/14/2016 1332   BILITOT 0.4 07/25/2013 0410   GFRNONAA >60 09/08/2016 1855   GFRNONAA >60 07/28/2013 0410   GFRAA >60 09/08/2016 1855   GFRAA >60 07/28/2013 0410       ASSESSMENT AND PLAN  Relapsing remitting multiple sclerosis (HCC) - Plan: IgG, IgA, IgM, CBC with Differential/Platelet  History of migraine headaches  Gait disturbance  High risk medication use - Plan: IgG, IgA, IgM, CBC with Differential/Platelet  Other fatigue  Urinary frequency  Depression with anxiety    1.   Continue Ocrevus for MS.   I will check IgG/A/M and CBC.  Continue generic Ampyra 10 mg bid to help with ambulation 2.   Continue the Adderall 30 mg XR in the morning with 10 mg as needed later in the day for her reduced attention. 3.  Continue topiramate 50-100 mg for migraines.  4.   Stop oxybutynin.   If [name] needs to go back on a bladder med, consider Vesicare. 5.   Due to peristent cough and being on Ocrevus (higher risk of pneumonia), we will check CXR. Return to clinic in 5-6 months or sooner if there are new or worsening neurologic symptoms.   Richard A. Epimenio Foot, MD, PhD 02/21/2017, 11:59 AM Certified in  Neurology, Clinical Neurophysiology, Sleep Medicine, Pain Medicine and Neuroimaging  [facility] [address], [address] [place], [place] [address] [phone] olll

## 2017-02-21 NOTE — Telephone Encounter (Signed)
I called her to let her know that the chest x-ray did not show any pneumonia.

## 2017-02-22 LAB — CBC WITH DIFFERENTIAL/PLATELET
Basophils Absolute: 0 10*3/uL (ref 0.0–0.2)
Basos: 0 %
EOS (ABSOLUTE): 0.3 10*3/uL (ref 0.0–0.4)
Eos: 5 %
Hematocrit: 40.4 % (ref 34.0–46.6)
Hemoglobin: 13.4 g/dL (ref 11.1–15.9)
IMMATURE GRANULOCYTES: 0 %
Immature Grans (Abs): 0 10*3/uL (ref 0.0–0.1)
LYMPHS ABS: 0.9 10*3/uL (ref 0.7–3.1)
Lymphs: 14 %
MCH: 28.2 pg (ref 26.6–33.0)
MCHC: 33.2 g/dL (ref 31.5–35.7)
MCV: 85 fL (ref 79–97)
MONOS ABS: 0.5 10*3/uL (ref 0.1–0.9)
Monocytes: 7 %
NEUTROS PCT: 74 %
Neutrophils Absolute: 5 10*3/uL (ref 1.4–7.0)
PLATELETS: 397 10*3/uL — AB (ref 150–379)
RBC: 4.76 x10E6/uL (ref 3.77–5.28)
RDW: 13.9 % (ref 12.3–15.4)
WBC: 6.8 10*3/uL (ref 3.4–10.8)

## 2017-02-22 LAB — IGG, IGA, IGM
IGA/IMMUNOGLOBULIN A, SERUM: 122 mg/dL (ref 87–352)
IgG (Immunoglobin G), Serum: 1011 mg/dL (ref 700–1600)
IgM (Immunoglobulin M), Srm: 131 mg/dL (ref 26–217)

## 2017-02-24 ENCOUNTER — Telehealth: Payer: Self-pay | Admitting: *Deleted

## 2017-02-24 NOTE — Telephone Encounter (Signed)
-----   Message from Asa Lente, MD sent at 02/23/2017 12:28 PM EST ----- Please let the patient know that the lab work is fine.

## 2017-02-24 NOTE — Telephone Encounter (Signed)
Spoke with pt. and reviewed below lab results. She verbalized understanding of same/fim 

## 2017-03-24 ENCOUNTER — Encounter: Payer: Self-pay | Admitting: Neurology

## 2017-03-25 MED ORDER — AMPYRA 10 MG PO TB12: 10.0000 mg | ORAL_TABLET | Freq: Two times a day (BID) | ORAL | 11 refills | Status: DC

## 2017-03-25 NOTE — Telephone Encounter (Signed)
Spoke with Shaterria. She has noticed more falls, balance is worse, on generic Ampyra.  Feels she did better on brand name.  Rx. for brand name escribed to [facility]/fim

## 2017-04-14 ENCOUNTER — Encounter: Payer: Self-pay | Admitting: Neurology

## 2017-04-15 ENCOUNTER — Telehealth: Payer: Self-pay | Admitting: *Deleted

## 2017-04-15 ENCOUNTER — Encounter: Payer: Self-pay | Admitting: Neurology

## 2017-04-15 NOTE — Telephone Encounter (Signed)
She has tried generic. Walking speed, balance have been worse.  She would like to go back to brand name Ampyra/fim

## 2017-04-15 NOTE — Telephone Encounter (Signed)
PA for Ampyra (brand name) 10mg  po bid #60/30 completed and faxed to [facility] fax# [phone].  Dx: RRMS (G35), Gait Disturbance (R26.9).

## 2017-04-21 ENCOUNTER — Encounter: Payer: Self-pay | Admitting: Neurology

## 2017-04-28 MED ORDER — DALFAMPRIDINE ER 10 MG PO TB12: 10.0000 mg | ORAL_TABLET | Freq: Two times a day (BID) | ORAL | 11 refills | Status: DC

## 2017-04-29 ENCOUNTER — Telehealth: Payer: Self-pay | Admitting: *Deleted

## 2017-04-29 NOTE — Telephone Encounter (Signed)
Called [employer] [phone]) to complete a PA for Dalfampridine 10mg  tablets #60/30, and was told an active auth is on file for dates 04/16/17 thru 02/03/18.  This info faxed to [facility] [employer], fax# [phone]/fim

## 2017-07-08 ENCOUNTER — Other Ambulatory Visit: Payer: Self-pay | Admitting: Neurology

## 2017-08-20 ENCOUNTER — Encounter: Payer: Self-pay | Admitting: Neurology

## 2017-08-20 ENCOUNTER — Ambulatory Visit: Payer: BLUE CROSS/BLUE SHIELD | Admitting: Neurology

## 2017-08-20 ENCOUNTER — Other Ambulatory Visit: Payer: Self-pay

## 2017-08-20 VITALS — BP 125/81 | HR 109 | Resp 18 | Ht 67.0 in | Wt 178.0 lb

## 2017-08-20 DIAGNOSIS — G35 Multiple sclerosis: Secondary | ICD-10-CM

## 2017-08-20 DIAGNOSIS — F988 Other specified behavioral and emotional disorders with onset usually occurring in childhood and adolescence: Secondary | ICD-10-CM

## 2017-08-20 DIAGNOSIS — R269 Unspecified abnormalities of gait and mobility: Secondary | ICD-10-CM

## 2017-08-20 DIAGNOSIS — M21371 Foot drop, right foot: Secondary | ICD-10-CM | POA: Diagnosis not present

## 2017-08-20 DIAGNOSIS — R5383 Other fatigue: Secondary | ICD-10-CM

## 2017-08-20 DIAGNOSIS — G43009 Migraine without aura, not intractable, without status migrainosus: Secondary | ICD-10-CM

## 2017-08-20 MED ORDER — TOPIRAMATE 100 MG PO TABS: 100.0000 mg | ORAL_TABLET | Freq: Every day | ORAL | 11 refills | Status: DC

## 2017-08-20 MED ORDER — AMPHETAMINE-DEXTROAMPHET ER 30 MG PO CP24: 30.0000 mg | ORAL_CAPSULE | Freq: Every day | ORAL | 0 refills | Status: DC

## 2017-08-20 MED ORDER — DALFAMPRIDINE ER 10 MG PO TB12: 10.0000 mg | ORAL_TABLET | Freq: Two times a day (BID) | ORAL | 11 refills | Status: DC

## 2017-08-20 MED ORDER — OXYBUTYNIN CHLORIDE ER 10 MG PO TB24
10.0000 mg | ORAL_TABLET | Freq: Every day | ORAL | 11 refills | Status: DC
Start: 2017-08-20 — End: 2017-11-18

## 2017-08-20 NOTE — Progress Notes (Signed)
[facility]  PATIENT: [name] DOB: [birth year]  REFERRING DOCTOR OR PCP:  Dr. Cristopher Peru  [facility]) SOURCE: Patient, a couple MRI reports and images on PACS. Lab reports, notes from Dr. Clelia Croft  _________________________________   HISTORICAL  CHIEF COMPLAINT:  Chief Complaint  Patient presents with  . Multiple Sclerosis    Sts.she feels better on the Ocrevus than she did on the Gilenya. Last infusion was in May and next is sched. just before Thanksgiving.  Sts. walking is better and she only has to occasionally [place] a a walker.  Today has c/o increased difficulty with word finding.  She is not able to afford her $108/month copay for Ampyra/fim  . Refills Needed    Oxybutynin, Topamax, Adderall/fim    HISTORY OF PRESENT ILLNESS:  [name] is a 37 year old woman with relapsing remitting multiple sclerosis.     Update 08/20/2017: She feels she has been stable on Ocrevus without any recent exacerbation.    At the last visit she had had a cough without fever and CXR was fine.   Her gait is doing well and she rarely uses the walker (just if tired and walking outside).     She has not been able to afford Ampyra.      Her right leg is weaker and more spastic than her left leg.   She notes it more when standing and singing as she feels itr also affects her balance.   She takes baclofen 10 in am and 20 mg in evening.   Oxybutynin helps her bladder function.    She has some fatigue that is helped by Adderall.  She tolerates it well.  She is sleeping well most nights.  Mood is doing okay.  Cognition is okay with some attention deficit helped by the Adderall.  Migraines are doing fairly well.   She did better on topiramate but ran out a few months ago.    Update 02/21/2017: She is on Ocrevus and tolerates it well.  She has not had any exacerbations and feels a little bit better.  She feels gait is better on Ampyra.   She now Hardly ever uses a walker though she still has  to touch the walls for balance. Speed is greatly improved. The right leg is weaker and more spastic than the left leg. He is having less leg pain. Vision has been stable.   She has urinary urgency and frequency that is helped by the oxybutynin. However,  she notes more dry mouth.   We discussed oxybutynin is most likely to cause this if she can change it to an as-needed basis.   Headaches are better on TPM 50 mg.  she tolerates that well. She was on 100 but was able to reduce the dose. She continues to note fatigue and attention deficit. Adderall has helped this. Mood has done better recently. She reports sleeping well most nights but less so since night time baclofen was held.  . She takes trazodone.  From 10/16/2016:  MS:   She had an exacerbation in 2017 and early 2018 switch to ocrelizumab with her first doses in May 2018 (was on Gilenya).   She had another small exacerbation in July (right leg stiff/weak) and she received 3 days of IV site.  She went from using a cane to a walker for balance.  She tolerates the ocrelizumab well.     Gait/strength/sensatoin:  She is using a walker for long distances but is able to  walk shorter distances without it.   Her right leg is weaker than her left and gets stiff.   It is painful when stiff.  Her foot drags on the right and catches the floor causing a stumble.   Gait worsenend with the exacerbation in 2017.  She was walking without a cane last year.    Baclofen has helped the spasticity slightly.   There is some allodynia in the legs at times and also the right arm..  We discussed Ampyra for her walking  Vision: She has numbness around her left eye and she feels there is reduced left vision with dots and squiggles in the vision.  The left eye also hurts, worse with movements.. She denies diplopia..  She denies vertigo.  HA:   Migraine headaches have done better on Topamax.  It does cause a dry mouth and change in taste. She now is just having a couple migraines  a month.   When present, they are located in the right forehead and sometimes also the right occiput.   When present, pain is throbbing.    When intense, she also has photophobia and phonophobia.   Moving worsens the pain. She takes NSAIDs with mild benefit..    Bladder:   She is doing better, oxybutynin has helped.   Fatigue/sleep: She has physical and cognitive fatigue. This starts in the mornings but is worse later in the day and if she gets hot. Sometimes she also feels sleepy. Adderall is helping some.   The higher dose of Adderall is only helping a bit.   She is on Adderall XR 30 mg in the am and 10 mg IR at 1 pm.  She sometimes has sleep onset and sleep maintenance insomnia.   Mood/cognition: She notes some depression and frustration.  She sees [employer] [facility].   She also has noted difficulty with cognitive skills over the past couple of years.     Her mom has MS and her MS stabilized after a bone marrow transplant for cancer.  MS history:   In late June or early July 2011, she had the onset of numbness that went up to her belly button. She presented to the emergency room. An MRI of the brain showed 1 periventricular focus with maybe a second subtle periventricular focus. An MRI of the cervical spine showed an enhancing lesion adjacent to C3-C4. The diagnosis of possible MS was made but the evidence was not enough to begin a disease modifying therapy. In 2015, she had the onset of similar symptoms and also had decreased ability to use the left hand. An MRI of the brain at that time showed several new lesions not present in 2011, including a focus in the right middle cerebellar peduncle additionally, she had a lumbar puncture consistent with MS. Because of her symptoms, MRI changes and lumbar puncture results, she was diagnosed with clinical definite relapsing remitting MS and was started on Tecfidera. She has continued on Tecfidera. Over the past few months she has noted more difficulty with focus and  concentration.     She also has had some change in vision more recently. Because of these newer symptoms, and MRI was repeated 04/27/2015. The MRI of the cervical spine showed no new lesions. though the MRI of the brain showed 2  lesions not present in 2015, one in the left juxtacortical white matter and one in the left deep white matter.  She had a small exacerbation in August 2017 and received IV SOlu-Medrol.  MRI cervical spine 2017 an MRI of the brain show two spinal plaques, a definite rightt middle cerebellar peduncle plaque and a possible right pontine plaque and several foci in the hemispheres  MRI 01/2016 showed no new lesions.  She is JCV Ab positive.      REVIEW OF SYSTEMS: Constitutional: No fevers, chills, sweats, or change in appetite.  She has fatigue and insomnia. Eyes: No visual changes, double vision, eye pain Ear, nose and throat: No hearing loss, ear pain, nasal congestion, sore throat Cardiovascular: No chest pain, palpitations Respiratory: No shortness of breath at rest or with exertion.   No wheezes GastrointestinaI: No nausea, vomiting, diarrhea, abdominal pain, fecal incontinence Genitourinary:   She notes frequency, some hesitancy at times and nocturia. Musculoskeletal: No neck pain, back pain Integumentary: No rash, pruritus, skin lesions Neurological: as above Psychiatric: as above Endocrine: No palpitations, diaphoresis, change in appetite, change in weigh or increased thirst Hematologic/Lymphatic: No anemia, purpura, petechiae. Allergic/Immunologic: No itchy/runny eyes, nasal congestion, recent allergic reactions, rashes  ALLERGIES: No Known Allergies  HOME MEDICATIONS:  Current Outpatient Medications:  .  amphetamine-dextroamphetamine (ADDERALL XR) 30 MG 24 hr capsule, Take 1 capsule (30 mg total) by mouth daily., Disp: 30 capsule, Rfl: 0 .  amphetamine-dextroamphetamine (ADDERALL) 10 MG tablet, Take one or two po in the aftrernoon, Disp: 60 tablet, Rfl:  0 .  baclofen (LIORESAL) 10 MG tablet, TAKE 1 TABLET BY MOUTH THREE TIMES A DAY AS NEEDED FOR MUSCLE SPASMS, Disp: 270 tablet, Rfl: 1 .  Cholecalciferol (VITAMIN D-1000 MAX ST) 1000 units tablet, Take 5,000 Units by mouth daily. , Disp: , Rfl:  .  Menthol, Topical Analgesic, (BIOFREEZE EX), Apply 1 application topically as needed (Pain)., Disp: , Rfl:  .  ocrelizumab 600 mg in sodium chloride 0.9 % 500 mL, Inject 600 mg into the vein every 6 (six) months., Disp: , Rfl:  .  oxybutynin (DITROPAN-XL) 10 MG 24 hr tablet, Take 1 tablet (10 mg total) by mouth at bedtime., Disp: 30 tablet, Rfl: 11 .  sertraline (ZOLOFT) 100 MG tablet, , Disp: , Rfl: 0 .  topiramate (TOPAMAX) 100 MG tablet, Take 1 tablet (100 mg total) by mouth at bedtime., Disp: 30 tablet, Rfl: 11 .  traZODone (DESYREL) 50 MG tablet, Take 1 tablet (50 mg total) by mouth at bedtime., Disp: 90 tablet, Rfl: 3 .  triamcinolone (NASACORT ALLERGY 24HR) 55 MCG/ACT AERO nasal inhaler, Place 1 spray into the nose as needed (Congestion)., Disp: , Rfl:  .  dalfampridine 10 MG TB12, Take 1 tablet (10 mg total) by mouth 2 (two) times daily., Disp: 60 tablet, Rfl: 11  PAST MEDICAL HISTORY: Past Medical History:  Diagnosis Date  . MS (multiple sclerosis) (HCC)     PAST SURGICAL HISTORY: History reviewed. No pertinent surgical history.  FAMILY HISTORY: History reviewed. No pertinent family history.  SOCIAL HISTORY:  Social History   Socioeconomic History  . Marital status: Divorced    Spouse name: Not on file  . Number of children: Not on file  . Years of education: Not on file  . Highest education level: Not on file  Occupational History  . Not on file  Social Needs  . Financial resource strain: Not on file  . Food insecurity:    Worry: Not on file    Inability: Not on file  . Transportation needs:    Medical: Not on file    Non-medical: Not on file  Tobacco Use  . Smoking status: Never Smoker  .  Smokeless tobacco: Never Used   Substance and Sexual Activity  . Alcohol use: Yes    Alcohol/week: 0.0 oz    Comment: occasional/fim  . Drug use: No  . Sexual activity: Not on file  Lifestyle  . Physical activity:    Days per week: Not on file    Minutes per session: Not on file  . Stress: Not on file  Relationships  . Social connections:    Talks on phone: Not on file    Gets together: Not on file    Attends religious service: Not on file    Active member of club or organization: Not on file    Attends meetings of clubs or organizations: Not on file    Relationship status: Not on file  . Intimate partner violence:    Fear of current or ex partner: Not on file    Emotionally abused: Not on file    Physically abused: Not on file    Forced sexual activity: Not on file  Other Topics Concern  . Not on file  Social History Narrative  . Not on file     PHYSICAL EXAM  Vitals:   08/20/17 0947  BP: 125/81  Pulse: (!) 109  Resp: 18  Weight: 178 lb (80.7 kg)  Height: 5\' 7"  (1.702 m)    Body mass index is 27.88 kg/m.   General: The patient is well-developed and well-nourished and in no acute distress   HEENT:    [place]/AT, fundoscopic exam is normal.       Musculoskeletal:    The neck is nontender with a good range of motion.  Neurologic Exam  Mental status: The patient is alert and oriented x 3 at the time of the examination. The patient has apparent normal recent and remote memory, with an apparently normal attention span and concentration ability.   Speech is normal.  Cranial nerves: Extraocular movements are full.  Facial strength is normal but mild reduced left touch/temp sensation Trapezius strength is normal.  The tongue is midline, and the patient has symmetric elevation of the soft palate. No obvious hearing deficits are noted.  Motor:  Muscle bulk is normal.   Tone is mildly increased in legs, bilaterally fairly equally.  Strength is  5 / 5 in all 4 extremities except 4+/5 in the feet.    Sensory: Sensory testing shows mild reduced right leg vibration sensation and left leg pain sensation  Coordination: Cerebellar testing reveals good finger-nose-finger and reduced heel-to-shin, left worse than right.  Gait and station: Station is normal.  She has a mild right foot drop. Tandem walking is wide . The Romberg is negative..   Reflexes: Deep tendon reflexes are symmetric and increased in her legs. There is spread at the knees. No ankle clonus.          DIAGNOSTIC DATA (LABS, IMAGING, TESTING) - I reviewed patient records, labs, notes, testing and imaging myself where available.  Lab Results  Component Value Date   WBC 6.8 02/21/2017   HGB 13.4 02/21/2017   HCT 40.4 02/21/2017   MCV 85 02/21/2017   PLT 397 (H) 02/21/2017      Component Value Date/Time   NA 139 09/08/2016 1855   NA 137 08/14/2016 1332   NA 141 07/28/2013 0410   K 3.2 (L) 09/08/2016 1855   K 3.7 07/28/2013 0410   CL 108 09/08/2016 1855   CL 103 07/28/2013 0410   CO2 22 09/08/2016 1855   CO2 29 07/28/2013  0410   GLUCOSE 98 09/08/2016 1855   GLUCOSE 121 (H) 07/28/2013 0410   BUN 8 09/08/2016 1855   BUN 8 08/14/2016 1332   BUN 16 07/28/2013 0410   CREATININE 0.76 09/08/2016 1855   CREATININE 0.66 07/28/2013 0410   CALCIUM 8.7 (L) 09/08/2016 1855   CALCIUM 8.6 07/28/2013 0410   PROT 6.9 08/14/2016 1332   PROT 6.7 07/25/2013 0410   ALBUMIN 4.4 08/14/2016 1332   ALBUMIN 3.3 (L) 07/25/2013 0410   AST 15 08/14/2016 1332   AST 20 07/25/2013 0410   ALT 17 08/14/2016 1332   ALT 18 07/25/2013 0410   ALKPHOS 85 08/14/2016 1332   ALKPHOS 49 07/25/2013 0410   BILITOT 0.4 08/14/2016 1332   BILITOT 0.4 07/25/2013 0410   GFRNONAA >60 09/08/2016 1855   GFRNONAA >60 07/28/2013 0410   GFRAA >60 09/08/2016 1855   GFRAA >60 07/28/2013 0410       ASSESSMENT AND PLAN  Relapsing remitting multiple sclerosis (HCC)  Migraine without aura and without status migrainosus, not intractable  Gait  disturbance  Other fatigue  Attention deficit disorder, unspecified hyperactivity presence  Right foot drop    1.   She will continue Ocrevus.  Lab work is fine at the next visit. 2.   Continue the Adderall 30 mg XR in the morning with 10 mg as needed later in the day for her reduced attention.    Continue Topamax for migraines 3.    We discussed try to get back on Ampyra as it had helped her in the past.   The generic price has gone down quite a bit. 4.   Return to clinic in 5-6 months or sooner if there are new or worsening neurologic symptoms.   Kolette Vey A. Epimenio Foot, MD, PhD 08/20/2017, 1:09 PM Certified in Neurology, Clinical Neurophysiology, Sleep Medicine, Pain Medicine and Neuroimaging  [facility] [address], [address] [place], [place] [address] [phone] olll

## 2017-08-25 ENCOUNTER — Encounter: Payer: Self-pay | Admitting: Neurology

## 2017-09-04 ENCOUNTER — Encounter: Payer: Self-pay | Admitting: Neurology

## 2017-09-05 ENCOUNTER — Telehealth: Payer: Self-pay | Admitting: *Deleted

## 2017-09-05 NOTE — Telephone Encounter (Signed)
Email from patient:  ----- Message from Mychart, Generic sent at 09/04/2017 9:50 PM EDT -----    Thank you for your last reply I will see an orthopedic doctor of it continues to be an issue.   This email however is about what I feel is the beginning of a flare. Over the past couple of weeks my fatigue has been almost more than I can handle. Then Monday my whole body felt like a vibrating string. Tuesday I had numbness in my legs and face. Wednesday I was in bed with chills and heavy legs that I just couldn't move well. No fever but chills. Thursday I pushed through the day but by the end my legs were numb and heavy and half of my face feels numb at times but other times feels hypersensitive. All the while I feel like my body is constantly vibrating.   I know you only work half a day tomorrow and I myself start at 9:30 in [place] so unless I    come at 8 for a treatment I'll have to wait until Monday. You may want me to do that anyway. I will have my phone on.

## 2017-09-05 NOTE — Telephone Encounter (Signed)
Per vo by Dr. Epimenio Foot, place patient on his schedule for Monday.  Spoke to patient and she is agreeable to 1:30pm.  She is aware to arrive early for check-in.

## 2017-09-08 ENCOUNTER — Other Ambulatory Visit: Payer: Self-pay

## 2017-09-08 ENCOUNTER — Encounter: Payer: Self-pay | Admitting: Neurology

## 2017-09-08 ENCOUNTER — Ambulatory Visit: Payer: BLUE CROSS/BLUE SHIELD | Admitting: Neurology

## 2017-09-08 VITALS — BP 119/77 | HR 97 | Resp 16 | Ht 67.0 in | Wt 177.5 lb

## 2017-09-08 DIAGNOSIS — G35 Multiple sclerosis: Secondary | ICD-10-CM | POA: Diagnosis not present

## 2017-09-08 DIAGNOSIS — R350 Frequency of micturition: Secondary | ICD-10-CM

## 2017-09-08 DIAGNOSIS — R5383 Other fatigue: Secondary | ICD-10-CM | POA: Diagnosis not present

## 2017-09-08 DIAGNOSIS — M21371 Foot drop, right foot: Secondary | ICD-10-CM

## 2017-09-08 DIAGNOSIS — F988 Other specified behavioral and emotional disorders with onset usually occurring in childhood and adolescence: Secondary | ICD-10-CM

## 2017-09-08 DIAGNOSIS — Z79899 Other long term (current) drug therapy: Secondary | ICD-10-CM | POA: Diagnosis not present

## 2017-09-08 DIAGNOSIS — G3281 Cerebellar ataxia in diseases classified elsewhere: Secondary | ICD-10-CM | POA: Diagnosis not present

## 2017-09-08 DIAGNOSIS — R200 Anesthesia of skin: Secondary | ICD-10-CM

## 2017-09-08 MED ORDER — TOLTERODINE TARTRATE ER 4 MG PO CP24: 4.0000 mg | ORAL_CAPSULE | Freq: Every day | ORAL | 11 refills | Status: DC

## 2017-09-08 NOTE — Progress Notes (Signed)
[facility]  PATIENT: [name] DOB: [birth year]  REFERRING DOCTOR OR PCP:  Dr. Cristopher Peru  [facility]) SOURCE: Patient, a couple MRI reports and images on PACS. Lab reports, notes from Dr. Clelia Croft  _________________________________   HISTORICAL  CHIEF COMPLAINT:  Chief Complaint  Patient presents with  . Multiple Sclerosis    Increased intermittent numbness, weakness bilat legs (right worse than left), over the last 2 wks.  Sts. she had left sided facial numbness last wk., but htis is better this wk. Worsening fatigue over the last wk.  Also having cold chills over the last week.  Denies fever/fim    HISTORY OF PRESENT ILLNESS:  [name] is a 37 y.o. woman with relapsing remitting multiple sclerosis.     Update 09/08/2017: Over the last couple weeks, she has noted more neurologic symptoms including numbness and weakness in the legs, right greater than left.    Current symptoms started about 2 weeks ago.  She denies any viral or bacterial infections.     She is also noting pain in the right leg.    Initially, she had left facial numbness but that resolved aftr a few days.      Of note, she was doing painting about 3 weeks ago, a couple days before current symptoms started, and her leg was giving out and had some pain.     She has also noted more fatigue.  Adderall 30 mg in am and 10 mg at one pm has helped but she is noting more issues in the afternoons,   She is noting more verbal fluency issues.      Vision is fine.   She notes more bladder issues.   Oxybutynin causes dry mouth.  She had her last Ocrevus injection about 3 months ago and she is tolerating it well.    Her last MRI was 09/2016 and showed no new lesions.      Update 08/20/2017: She feels she has been stable on Ocrevus without any recent exacerbation.    At the last visit she had had a cough without fever and CXR was fine.   Her gait is doing well and she rarely uses the walker (just if tired  and walking outside).     She has not been able to afford Ampyra.      Her right leg is weaker and more spastic than her left leg.   She notes it more when standing and singing as she feels itr also affects her balance.   She takes baclofen 10 in am and 20 mg in evening.   Oxybutynin helps her bladder function.    She has some fatigue that is helped by Adderall.  She tolerates it well.  She is sleeping well most nights.  Mood is doing okay.  Cognition is okay with some attention deficit helped by the Adderall.  Migraines are doing fairly well.   She did better on topiramate but ran out a few months ago.    Update 02/21/2017: She is on Ocrevus and tolerates it well.  She has not had any exacerbations and feels a little bit better.  She feels gait is better on Ampyra.   She now Hardly ever uses a walker though she still has to touch the walls for balance. Speed is greatly improved. The right leg is weaker and more spastic than the left leg. He is having less leg pain. Vision has been stable.   She has urinary urgency and  frequency that is helped by the oxybutynin. However,  she notes more dry mouth.   We discussed oxybutynin is most likely to cause this if she can change it to an as-needed basis.   Headaches are better on TPM 50 mg.  she tolerates that well. She was on 100 but was able to reduce the dose. She continues to note fatigue and attention deficit. Adderall has helped this. Mood has done better recently. She reports sleeping well most nights but less so since night time baclofen was held.  . She takes trazodone.  From 10/16/2016:  MS:   She had an exacerbation in 2017 and early 2018 switch to ocrelizumab with her first doses in May 2018 (was on Gilenya).   She had another small exacerbation in July (right leg stiff/weak) and she received 3 days of IV site.  She went from using a cane to a walker for balance.  She tolerates the ocrelizumab well.     Gait/strength/sensatoin:  She is using a walker  for long distances but is able to walk shorter distances without it.   Her right leg is weaker than her left and gets stiff.   It is painful when stiff.  Her foot drags on the right and catches the floor causing a stumble.   Gait worsenend with the exacerbation in 2017.  She was walking without a cane last year.    Baclofen has helped the spasticity slightly.   There is some allodynia in the legs at times and also the right arm..  We discussed Ampyra for her walking  Vision: She has numbness around her left eye and she feels there is reduced left vision with dots and squiggles in the vision.  The left eye also hurts, worse with movements.. She denies diplopia..  She denies vertigo.  HA:   Migraine headaches have done better on Topamax.  It does cause a dry mouth and change in taste. She now is just having a couple migraines a month.   When present, they are located in the right forehead and sometimes also the right occiput.   When present, pain is throbbing.    When intense, she also has photophobia and phonophobia.   Moving worsens the pain. She takes NSAIDs with mild benefit..    Bladder:   She is doing better, oxybutynin has helped.   Fatigue/sleep: She has physical and cognitive fatigue. This starts in the mornings but is worse later in the day and if she gets hot. Sometimes she also feels sleepy. Adderall is helping some.   The higher dose of Adderall is only helping a bit.   She is on Adderall XR 30 mg in the am and 10 mg IR at 1 pm.  She sometimes has sleep onset and sleep maintenance insomnia.   Mood/cognition: She notes some depression and frustration.  She sees [facility].   She also has noted difficulty with cognitive skills over the past couple of years.     Her mom has MS and her MS stabilized after a bone marrow transplant for cancer.  MS history:   In late June or early July 2011, she had the onset of numbness that went up to her belly button. She presented to the emergency room. An MRI  of the brain showed 1 periventricular focus with maybe a second subtle periventricular focus. An MRI of the cervical spine showed an enhancing lesion adjacent to C3-C4. The diagnosis of possible MS was made but the evidence was  not enough to begin a disease modifying therapy. In 2015, she had the onset of similar symptoms and also had decreased ability to use the left hand. An MRI of the brain at that time showed several new lesions not present in 2011, including a focus in the right middle cerebellar peduncle additionally, she had a lumbar puncture consistent with MS. Because of her symptoms, MRI changes and lumbar puncture results, she was diagnosed with clinical definite relapsing remitting MS and was started on Tecfidera. She has continued on Tecfidera. Over the past few months she has noted more difficulty with focus and concentration.     She also has had some change in vision more recently. Because of these newer symptoms, and MRI was repeated 04/27/2015. The MRI of the cervical spine showed no new lesions. though the MRI of the brain showed 2  lesions not present in 2015, one in the left juxtacortical white matter and one in the left deep white matter.  She had a small exacerbation in August 2017 and received IV SOlu-Medrol.   MRI cervical spine 2017 an MRI of the brain show two spinal plaques, a definite rightt middle cerebellar peduncle plaque and a possible right pontine plaque and several foci in the hemispheres  MRI 01/2016 showed no new lesions.  She is JCV Ab positive.      REVIEW OF SYSTEMS: Constitutional: No fevers, chills, sweats, or change in appetite.  She has fatigue and insomnia. Eyes: No visual changes, double vision, eye pain Ear, nose and throat: No hearing loss, ear pain, nasal congestion, sore throat Cardiovascular: No chest pain, palpitations Respiratory: No shortness of breath at rest or with exertion.   No wheezes GastrointestinaI: No nausea, vomiting, diarrhea, abdominal  pain, fecal incontinence Genitourinary:   She notes frequency, some hesitancy at times and nocturia. Musculoskeletal: No neck pain, back pain Integumentary: No rash, pruritus, skin lesions Neurological: as above Psychiatric: as above Endocrine: No palpitations, diaphoresis, change in appetite, change in weigh or increased thirst Hematologic/Lymphatic: No anemia, purpura, petechiae. Allergic/Immunologic: No itchy/runny eyes, nasal congestion, recent allergic reactions, rashes  ALLERGIES: No Known Allergies  HOME MEDICATIONS:  Current Outpatient Medications:  .  amphetamine-dextroamphetamine (ADDERALL XR) 30 MG 24 hr capsule, Take 1 capsule (30 mg total) by mouth daily., Disp: 30 capsule, Rfl: 0 .  amphetamine-dextroamphetamine (ADDERALL) 10 MG tablet, Take one or two po in the aftrernoon, Disp: 60 tablet, Rfl: 0 .  baclofen (LIORESAL) 10 MG tablet, TAKE 1 TABLET BY MOUTH THREE TIMES A DAY AS NEEDED FOR MUSCLE SPASMS, Disp: 270 tablet, Rfl: 1 .  Cholecalciferol (VITAMIN D-1000 MAX ST) 1000 units tablet, Take 5,000 Units by mouth daily. , Disp: , Rfl:  .  Menthol, Topical Analgesic, (BIOFREEZE EX), Apply 1 application topically as needed (Pain)., Disp: , Rfl:  .  ocrelizumab 600 mg in sodium chloride 0.9 % 500 mL, Inject 600 mg into the vein every 6 (six) months., Disp: , Rfl:  .  oxybutynin (DITROPAN-XL) 10 MG 24 hr tablet, Take 1 tablet (10 mg total) by mouth at bedtime., Disp: 30 tablet, Rfl: 11 .  sertraline (ZOLOFT) 100 MG tablet, , Disp: , Rfl: 0 .  topiramate (TOPAMAX) 100 MG tablet, Take 1 tablet (100 mg total) by mouth at bedtime., Disp: 30 tablet, Rfl: 11 .  traZODone (DESYREL) 50 MG tablet, Take 1 tablet (50 mg total) by mouth at bedtime., Disp: 90 tablet, Rfl: 3 .  triamcinolone (NASACORT ALLERGY 24HR) 55 MCG/ACT AERO nasal inhaler, Place 1 spray  into the nose as needed (Congestion)., Disp: , Rfl:  .  tolterodine (DETROL LA) 4 MG 24 hr capsule, Take 1 capsule (4 mg total) by  mouth daily., Disp: 30 capsule, Rfl: 11  PAST MEDICAL HISTORY: Past Medical History:  Diagnosis Date  . MS (multiple sclerosis) (HCC)     PAST SURGICAL HISTORY: History reviewed. No pertinent surgical history.  FAMILY HISTORY: History reviewed. No pertinent family history.  SOCIAL HISTORY:  Social History   Socioeconomic History  . Marital status: Divorced    Spouse name: Not on file  . Number of children: Not on file  . Years of education: Not on file  . Highest education level: Not on file  Occupational History  . Not on file  Social Needs  . Financial resource strain: Not on file  . Food insecurity:    Worry: Not on file    Inability: Not on file  . Transportation needs:    Medical: Not on file    Non-medical: Not on file  Tobacco Use  . Smoking status: Never Smoker  . Smokeless tobacco: Never Used  Substance and Sexual Activity  . Alcohol use: Yes    Alcohol/week: 0.0 oz    Comment: occasional/fim  . Drug use: No  . Sexual activity: Not on file  Lifestyle  . Physical activity:    Days per week: Not on file    Minutes per session: Not on file  . Stress: Not on file  Relationships  . Social connections:    Talks on phone: Not on file    Gets together: Not on file    Attends religious service: Not on file    Active member of club or organization: Not on file    Attends meetings of clubs or organizations: Not on file    Relationship status: Not on file  . Intimate partner violence:    Fear of current or ex partner: Not on file    Emotionally abused: Not on file    Physically abused: Not on file    Forced sexual activity: Not on file  Other Topics Concern  . Not on file  Social History Narrative  . Not on file     PHYSICAL EXAM  Vitals:   09/08/17 1340  BP: 119/77  Pulse: 97  Resp: 16  Weight: 177 lb 8 oz (80.5 kg)  Height: 5\' 7"  (1.702 m)    Body mass index is 27.8 kg/m.   General: The patient is well-developed and well-nourished and  in no acute distress    Musculoskeletal:    The neck is nontender with a good range of motion.  Neurologic Exam  Mental status: The patient is alert and oriented x 3 at the time of the examination. The patient has apparent normal recent and remote memory, with an apparently normal attention span and concentration ability.   Speech is normal.  Cranial nerves: Extraocular movements are full.  Facial strength and sensation is normal.  Trapezius strength is normal.  The tongue is midline, and the patient has symmetric elevation of the soft palate. No obvious hearing deficits are noted.  Motor:  Muscle bulk is normal.   Tone is mildly increased in legs, bilaterally fairly equally.  Strength is  5 / 5 in all 4 extremities except 4+/5 in the feet.   Sensory: Sensory testing shows reduced right leg vibration sensation in left leg temperature sensation  Coordination: Cerebellar testing shows good finger-nose-finger but reduced heel-to-shin bilaterally.  Gait and  station: Station is normal.  She has a right foot drop.  The tandem walk is wide.. The Romberg is negative..   Reflexes: Deep tendon reflexes are symmetric and increased in her legs. There is spread at the knees. No ankle clonus.          DIAGNOSTIC DATA (LABS, IMAGING, TESTING) - I reviewed patient records, labs, notes, testing and imaging myself where available.  Lab Results  Component Value Date   WBC 6.8 02/21/2017   HGB 13.4 02/21/2017   HCT 40.4 02/21/2017   MCV 85 02/21/2017   PLT 397 (H) 02/21/2017      Component Value Date/Time   NA 139 09/08/2016 1855   NA 137 08/14/2016 1332   NA 141 07/28/2013 0410   K 3.2 (L) 09/08/2016 1855   K 3.7 07/28/2013 0410   CL 108 09/08/2016 1855   CL 103 07/28/2013 0410   CO2 22 09/08/2016 1855   CO2 29 07/28/2013 0410   GLUCOSE 98 09/08/2016 1855   GLUCOSE 121 (H) 07/28/2013 0410   BUN 8 09/08/2016 1855   BUN 8 08/14/2016 1332   BUN 16 07/28/2013 0410   CREATININE 0.76  09/08/2016 1855   CREATININE 0.66 07/28/2013 0410   CALCIUM 8.7 (L) 09/08/2016 1855   CALCIUM 8.6 07/28/2013 0410   PROT 6.9 08/14/2016 1332   PROT 6.7 07/25/2013 0410   ALBUMIN 4.4 08/14/2016 1332   ALBUMIN 3.3 (L) 07/25/2013 0410   AST 15 08/14/2016 1332   AST 20 07/25/2013 0410   ALT 17 08/14/2016 1332   ALT 18 07/25/2013 0410   ALKPHOS 85 08/14/2016 1332   ALKPHOS 49 07/25/2013 0410   BILITOT 0.4 08/14/2016 1332   BILITOT 0.4 07/25/2013 0410   GFRNONAA >60 09/08/2016 1855   GFRNONAA >60 07/28/2013 0410   GFRAA >60 09/08/2016 1855   GFRAA >60 07/28/2013 0410       ASSESSMENT AND PLAN  Multiple sclerosis (HCC) - Plan: CBC with Differential/Platelet, Comprehensive metabolic panel, TSH, T4, MR BRAIN W WO CONTRAST, MR CERVICAL SPINE W WO CONTRAST  Cerebellar ataxia in diseases classified elsewhere (HCC) - Plan: MR BRAIN W WO CONTRAST, MR CERVICAL SPINE W WO CONTRAST  Relapsing remitting multiple sclerosis (HCC)  High risk medication use  Other fatigue - Plan: CBC with Differential/Platelet, Comprehensive metabolic panel, TSH, T4  Urinary frequency  Attention deficit disorder, unspecified hyperactivity presence  Right foot drop  Numbness    1.   She will continue Ocrevus.  Check CBC, CMP, TSH/T4 2.   Continue the Adderall 30 mg XR in the morning with 10 mg as needed later in the day for her reduced attention.    Continue Topamax for migraines 3.    IV Solumedrol 1000 mg x 3 days 4.   Return to clinic in 5-6 months or sooner if there are new or worsening neurologic symptoms.   Vallie Teters A. Epimenio Foot, MD, PhD 09/08/2017, 3:30 PM Certified in Neurology, Clinical Neurophysiology, Sleep Medicine, Pain Medicine and Neuroimaging  [facility] [address], [address] [place], [place] [address] [phone] olll

## 2017-09-09 ENCOUNTER — Telehealth: Payer: Self-pay | Admitting: Neurology

## 2017-09-09 ENCOUNTER — Telehealth: Payer: Self-pay | Admitting: *Deleted

## 2017-09-09 LAB — COMPREHENSIVE METABOLIC PANEL
ALBUMIN: 4.9 g/dL (ref 3.5–5.5)
ALT: 14 IU/L (ref 0–32)
AST: 19 IU/L (ref 0–40)
Albumin/Globulin Ratio: 2.3 — ABNORMAL HIGH (ref 1.2–2.2)
Alkaline Phosphatase: 91 IU/L (ref 39–117)
BUN / CREAT RATIO: 20 (ref 9–23)
BUN: 15 mg/dL (ref 6–20)
Bilirubin Total: 0.3 mg/dL (ref 0.0–1.2)
CO2: 22 mmol/L (ref 20–29)
CREATININE: 0.76 mg/dL (ref 0.57–1.00)
Calcium: 9.6 mg/dL (ref 8.7–10.2)
Chloride: 103 mmol/L (ref 96–106)
GFR calc Af Amer: 114 mL/min/{1.73_m2} (ref >59)
GFR, EST NON AFRICAN AMERICAN: 99 mL/min/{1.73_m2} (ref >59)
GLOBULIN, TOTAL: 2.1 g/dL (ref 1.5–4.5)
Glucose: 92 mg/dL (ref 65–99)
Potassium: 3.8 mmol/L (ref 3.5–5.2)
SODIUM: 142 mmol/L (ref 134–144)
Total Protein: 7 g/dL (ref 6.0–8.5)

## 2017-09-09 LAB — CBC WITH DIFFERENTIAL/PLATELET
BASOS: 0 %
Basophils Absolute: 0 10*3/uL (ref 0.0–0.2)
EOS (ABSOLUTE): 0.1 10*3/uL (ref 0.0–0.4)
EOS: 1 %
HEMATOCRIT: 43.2 % (ref 34.0–46.6)
HEMOGLOBIN: 14.5 g/dL (ref 11.1–15.9)
Immature Grans (Abs): 0 10*3/uL (ref 0.0–0.1)
Immature Granulocytes: 0 %
LYMPHS ABS: 1.5 10*3/uL (ref 0.7–3.1)
Lymphs: 16 %
MCH: 28.2 pg (ref 26.6–33.0)
MCHC: 33.6 g/dL (ref 31.5–35.7)
MCV: 84 fL (ref 79–97)
MONOCYTES: 7 %
Monocytes Absolute: 0.7 10*3/uL (ref 0.1–0.9)
NEUTROS ABS: 7.4 10*3/uL — AB (ref 1.4–7.0)
Neutrophils: 76 %
Platelets: 419 10*3/uL (ref 150–450)
RBC: 5.14 x10E6/uL (ref 3.77–5.28)
RDW: 13.7 % (ref 12.3–15.4)
WBC: 9.7 10*3/uL (ref 3.4–10.8)

## 2017-09-09 LAB — T4: T4, Total: 7.6 ug/dL (ref 4.5–12.0)

## 2017-09-09 LAB — TSH: TSH: 1.24 u[IU]/mL (ref 0.450–4.500)

## 2017-09-09 NOTE — Telephone Encounter (Signed)
Patient returned my call..   I informed her that it would cost her about $1,392.60 to have the MRI's done I did offer the patient a payment plan but she is wondering if it is absolutely needed for her to have the MRI's this year or can she wait until next year to have them done? Please advise.

## 2017-09-09 NOTE — Telephone Encounter (Signed)
-----   Message from Asa Lente, MD sent at 09/09/2017  9:52 AM EDT ----- Please let the patient know that the lab work is fine.

## 2017-09-09 NOTE — Telephone Encounter (Signed)
LMOM that RAS does feel MRI's are needed, b/c of the 2 exacerbations she's had in the last several mos.  He needs to know if she has new MS lesions.  If so, this may mean her MS medication is not working for her and he needs to discuss other tx. options with her. If she has other questions, please call/fim

## 2017-09-09 NOTE — Telephone Encounter (Signed)
lvm for pt to call back about scheduling mri  [employer] Auth: [id number] (exp. 09/09/17 to 10/08/17)

## 2017-09-09 NOTE — Telephone Encounter (Signed)
LMOM with below lab results.  She does not need to return this call unless she has questions/fim 

## 2017-09-10 NOTE — Telephone Encounter (Signed)
I spoke to the patient she is scheduled for 09/24/17 at [facility].

## 2017-09-24 ENCOUNTER — Ambulatory Visit: Payer: BLUE CROSS/BLUE SHIELD

## 2017-09-24 DIAGNOSIS — G3281 Cerebellar ataxia in diseases classified elsewhere: Secondary | ICD-10-CM

## 2017-09-24 DIAGNOSIS — G35 Multiple sclerosis: Secondary | ICD-10-CM | POA: Diagnosis not present

## 2017-09-24 MED ORDER — GADOPENTETATE DIMEGLUMINE 469.01 MG/ML IV SOLN
15.0000 mL | Freq: Once | INTRAVENOUS | Status: AC | PRN
  Administered 2017-09-24: 15 mL via INTRAVENOUS

## 2017-09-26 ENCOUNTER — Telehealth: Payer: Self-pay | Admitting: Neurology

## 2017-09-26 NOTE — Telephone Encounter (Signed)
I called and left a message that the MRI of the brain and cervical spine did not show any new lesions.

## 2017-10-29 ENCOUNTER — Ambulatory Visit (INDEPENDENT_AMBULATORY_CARE_PROVIDER_SITE_OTHER): Payer: BLUE CROSS/BLUE SHIELD

## 2017-10-29 ENCOUNTER — Ambulatory Visit: Payer: BLUE CROSS/BLUE SHIELD | Admitting: Podiatry

## 2017-10-29 ENCOUNTER — Other Ambulatory Visit: Payer: Self-pay

## 2017-10-29 ENCOUNTER — Encounter: Payer: Self-pay | Admitting: Podiatry

## 2017-10-29 DIAGNOSIS — M722 Plantar fascial fibromatosis: Secondary | ICD-10-CM

## 2017-10-29 DIAGNOSIS — J302 Other seasonal allergic rhinitis: Secondary | ICD-10-CM | POA: Insufficient documentation

## 2017-10-29 MED ORDER — METHYLPREDNISOLONE 4 MG PO TABS: ORAL_TABLET | ORAL | 0 refills | Status: DC

## 2017-10-29 MED ORDER — MELOXICAM 15 MG PO TABS: 15.0000 mg | ORAL_TABLET | Freq: Every day | ORAL | 3 refills | Status: DC

## 2017-10-29 MED ORDER — AMPHETAMINE-DEXTROAMPHET ER 30 MG PO CP24: 30.0000 mg | ORAL_CAPSULE | Freq: Every day | ORAL | 0 refills | Status: DC

## 2017-10-29 NOTE — Progress Notes (Signed)
Subjective:  Patient ID: [name], female    DOB: 28-Dec-1980,  MRN: [id number] HPI Chief Complaint  Patient presents with  . Foot Pain    Patient presents today for bilat heel pain x 3 days. She states "my feet feel sore, like a deep bruise"  she reports a constant pain, but feels some better today.  She has only rested to help with some of the discomfort.    37 y.o. female presents with the above complaint.   ROS: Denies fever chills nausea vomiting muscle aches pains calf pain back pain chest pain shortness of breath.  Past Medical History:  Diagnosis Date  . MS (multiple sclerosis) (HCC)    No past surgical history on file.  Current Outpatient Medications:  .  amphetamine-dextroamphetamine (ADDERALL XR) 30 MG 24 hr capsule, Take 1 capsule (30 mg total) by mouth daily., Disp: 30 capsule, Rfl: 0 .  amphetamine-dextroamphetamine (ADDERALL) 10 MG tablet, Take one or two po in the aftrernoon, Disp: 60 tablet, Rfl: 0 .  baclofen (LIORESAL) 10 MG tablet, TAKE 1 TABLET BY MOUTH THREE TIMES A DAY AS NEEDED FOR MUSCLE SPASMS, Disp: 270 tablet, Rfl: 1 .  Cholecalciferol (VITAMIN D-1000 MAX ST) 1000 units tablet, Take 5,000 Units by mouth daily. , Disp: , Rfl:  .  meloxicam (MOBIC) 15 MG tablet, Take 1 tablet (15 mg total) by mouth daily., Disp: 30 tablet, Rfl: 3 .  Menthol, Topical Analgesic, (BIOFREEZE EX), Apply 1 application topically as needed (Pain)., Disp: , Rfl:  .  methylPREDNISolone (MEDROL) 4 MG tablet, Take as directed, Disp: 21 tablet, Rfl: 0 .  ocrelizumab 600 mg in sodium chloride 0.9 % 500 mL, Inject 600 mg into the vein every 6 (six) months., Disp: , Rfl:  .  oxybutynin (DITROPAN-XL) 10 MG 24 hr tablet, Take 1 tablet (10 mg total) by mouth at bedtime., Disp: 30 tablet, Rfl: 11 .  sertraline (ZOLOFT) 100 MG tablet, , Disp: , Rfl: 0 .  tolterodine (DETROL LA) 4 MG 24 hr capsule, Take 1 capsule (4 mg total) by mouth daily., Disp: 30 capsule, Rfl: 11 .  topiramate (TOPAMAX)  100 MG tablet, Take 1 tablet (100 mg total) by mouth at bedtime., Disp: 30 tablet, Rfl: 11 .  traZODone (DESYREL) 50 MG tablet, Take 1 tablet (50 mg total) by mouth at bedtime., Disp: 90 tablet, Rfl: 3 .  triamcinolone (NASACORT ALLERGY 24HR) 55 MCG/ACT AERO nasal inhaler, Place 1 spray into the nose as needed (Congestion)., Disp: , Rfl:   No Known Allergies Review of Systems Objective:  There were no vitals filed for this visit.  General: Well developed, nourished, in no acute distress, alert and oriented x3   Dermatological: Skin is warm, dry and supple bilateral. Nails x 10 are well maintained; remaining integument appears unremarkable at this time. There are no open sores, no preulcerative lesions, no rash or signs of infection present.  Vascular: Dorsalis Pedis artery and Posterior Tibial artery pedal pulses are 2/4 bilateral with immedate capillary fill time. Pedal hair growth present. No varicosities and no lower extremity edema present bilateral.   Neruologic: Grossly intact via light touch bilateral. Vibratory intact via tuning fork bilateral. Protective threshold with Semmes Wienstein monofilament intact to all pedal sites bilateral. Patellar and Achilles deep tendon reflexes 2+ bilateral. No Babinski or clonus noted bilateral.   Musculoskeletal: No gross boney pedal deformities bilateral. No pain, crepitus, or limitation noted with foot and ankle range of motion bilateral. Muscular strength 5/5 in all  groups tested bilateral.  She has pain on palpation medial calcaneal tubercle of the left heel more so than the right.  Gait: Unassisted, Nonantalgic.    Radiographs:  Radiographs taken today demonstrate a rectus foot type bilateral.  Soft tissue increase in density plantar fashion calcaneal insertion site left heel.  Assessment & Plan:   Assessment: Plantar fasciitis left heel.  Plan: Discussed etiology pathology conservative versus surgical therapies.  Discussed appropriate  shoe gear stretching exercise ice therapy sugar modifications.  After sterile Betadine skin prep I injected 20 mg Kenalog 5 mg Marcaine point maximal tenderness left heel.  Tolerated procedure well.  Also dispensed plantar fascial brace and a night splint.  Follow-up with her in 1 month.     Max T. [place], [place]

## 2017-10-29 NOTE — Patient Instructions (Signed)

## 2017-11-18 ENCOUNTER — Ambulatory Visit: Payer: BLUE CROSS/BLUE SHIELD | Admitting: Nurse Practitioner

## 2017-11-18 ENCOUNTER — Encounter: Payer: Self-pay | Admitting: Nurse Practitioner

## 2017-11-18 VITALS — BP 118/79 | HR 96 | Temp 98.4°F | Ht 62.68 in | Wt 180.0 lb

## 2017-11-18 DIAGNOSIS — Z23 Encounter for immunization: Secondary | ICD-10-CM | POA: Diagnosis not present

## 2017-11-18 DIAGNOSIS — G35 Multiple sclerosis: Secondary | ICD-10-CM | POA: Diagnosis not present

## 2017-11-18 NOTE — Progress Notes (Signed)
BP 118/79 (BP Location: Left Arm, Patient Position: Sitting, Cuff Size: Normal)   Pulse 96   Temp 98.4 F (36.9 C)   Ht 5' 2.68" (1.592 m)   Wt 180 lb (81.6 kg)   SpO2 100%   BMI 32.22 kg/m    Subjective:    Patient ID: [name], female    DOB: 1980-02-13, 37 y.o.   MRN: [id number]  HPI: [name] is a 37 y.o. female presents for initial new patient visit to establish care.  Chief Complaint  Patient presents with  . Establish Care   Multiple Sclerosis: Is followed by Dr. Epimenio Foot.  Last seen 09/08/17.  Is due for next transfusion before Thanksgiving, receives [employer].  Was diagnosed three years ago.  Numbness from waist down and inability to function limbs initial symptoms.  She takes Adderall 40MG  daily for fatigue from MS, as well as Baclofen for muscles spasms.  Sertraline and Trazodone for mood control with MS.  She had initially seen psychiatry fro increased tearfulness, which they felt to be due to an MS lesion being present in the "emotion area" of brain per patient report.  She denies any pain today.  Reports some increased fatigue, which she states happens prior to her next infusion.    Annual exam is due.  She is unsure when her last pap smear was.    Relevant past medical, surgical, family and social history reviewed and updated as indicated. Interim medical history since our last visit reviewed. Allergies and medications reviewed and updated.  Review of Systems  Constitutional: Positive for fatigue. Negative for activity change, fever and unexpected weight change.  Respiratory: Negative for cough, chest tightness, shortness of breath and wheezing.   Cardiovascular: Negative for chest pain and palpitations.  Gastrointestinal: Negative for abdominal distention and abdominal pain.  Musculoskeletal: Negative for back pain.  Neurological: Negative for dizziness, syncope, weakness and headaches.  Psychiatric/Behavioral: Negative for behavioral problems.     Per  HPI unless specifically indicated above     Objective:    BP 118/79 (BP Location: Left Arm, Patient Position: Sitting, Cuff Size: Normal)   Pulse 96   Temp 98.4 F (36.9 C)   Ht 5' 2.68" (1.592 m)   Wt 180 lb (81.6 kg)   SpO2 100%   BMI 32.22 kg/m   Wt Readings from Last 3 Encounters:  11/18/17 180 lb (81.6 kg)  09/08/17 177 lb 8 oz (80.5 kg)  08/20/17 178 lb (80.7 kg)    Physical Exam  Constitutional: She is oriented to person, place, and time. She appears well-developed and well-nourished.  HENT:  Head: Normocephalic and atraumatic.  Eyes: Pupils are equal, round, and reactive to light. EOM are normal.  Neck: Normal range of motion. Neck supple.  Cardiovascular: Normal rate, regular rhythm and normal heart sounds.  Pulmonary/Chest: Effort normal and breath sounds normal.  Abdominal: Soft. Bowel sounds are normal.  Musculoskeletal: Normal range of motion.  Neurological: She is alert and oriented to person, place, and time.  Skin: Skin is warm and dry.  Psychiatric: She has a normal mood and affect. Her behavior is normal.    Results for orders placed or performed in visit on 09/08/17  CBC with Differential/Platelet  Result Value Ref Range   WBC 9.7 3.4 - 10.8 x10E3/uL   RBC 5.14 3.77 - 5.28 x10E6/uL   Hemoglobin 14.5 11.1 - 15.9 g/dL   Hematocrit 40.9 81.1 - 46.6 %   MCV 84 79 - 97 fL  MCH 28.2 26.6 - 33.0 pg   MCHC 33.6 31.5 - 35.7 g/dL   RDW 16.1 09.6 - 04.5 %   Platelets 419 150 - 450 x10E3/uL   Neutrophils 76 Not Estab. %   Lymphs 16 Not Estab. %   Monocytes 7 Not Estab. %   Eos 1 Not Estab. %   Basos 0 Not Estab. %   Neutrophils Absolute 7.4 (H) 1.4 - 7.0 x10E3/uL   Lymphocytes Absolute 1.5 0.7 - 3.1 x10E3/uL   Monocytes Absolute 0.7 0.1 - 0.9 x10E3/uL   EOS (ABSOLUTE) 0.1 0.0 - 0.4 x10E3/uL   Basophils Absolute 0.0 0.0 - 0.2 x10E3/uL   Immature Granulocytes 0 Not Estab. %   Immature Grans (Abs) 0.0 0.0 - 0.1 x10E3/uL  Comprehensive metabolic panel    Result Value Ref Range   Glucose 92 65 - 99 mg/dL   BUN 15 6 - 20 mg/dL   Creatinine, Ser 4.09 0.57 - 1.00 mg/dL   GFR calc non Af Amer 99 >59 mL/min/1.73   GFR calc Af Amer 114 >59 mL/min/1.73   BUN/Creatinine Ratio 20 9 - 23   Sodium 142 134 - 144 mmol/L   Potassium 3.8 3.5 - 5.2 mmol/L   Chloride 103 96 - 106 mmol/L   CO2 22 20 - 29 mmol/L   Calcium 9.6 8.7 - 10.2 mg/dL   Total Protein 7.0 6.0 - 8.5 g/dL   Albumin 4.9 3.5 - 5.5 g/dL   Globulin, Total 2.1 1.5 - 4.5 g/dL   Albumin/Globulin Ratio 2.3 (H) 1.2 - 2.2   Bilirubin Total 0.3 0.0 - 1.2 mg/dL   Alkaline Phosphatase 91 39 - 117 IU/L   AST 19 0 - 40 IU/L   ALT 14 0 - 32 IU/L  TSH  Result Value Ref Range   TSH 1.240 0.450 - 4.500 uIU/mL  T4  Result Value Ref Range   T4, Total 7.6 4.5 - 12.0 ug/dL      Assessment & Plan:   Problem List Items Addressed This Visit      Nervous and Auditory   Relapsing remitting multiple sclerosis (HCC)    Chronic, ongoing.  Continue to collaborate with Dr. Epimenio Foot.       Other Visit Diagnoses    Immunization due    -  Primary   Relevant Orders   Flu Vaccine QUAD 6+ mos PF IM (Fluarix Quad PF)       Follow up plan: Return in about 2 weeks (around 12/02/2017) for annual physical.

## 2017-11-18 NOTE — Assessment & Plan Note (Signed)
Chronic, ongoing.  Continue to collaborate with Dr. Sater. 

## 2017-11-18 NOTE — Patient Instructions (Signed)

## 2017-12-01 ENCOUNTER — Other Ambulatory Visit: Payer: Self-pay

## 2017-12-01 MED ORDER — AMPHETAMINE-DEXTROAMPHET ER 30 MG PO CP24: 30.0000 mg | ORAL_CAPSULE | Freq: Every day | ORAL | 0 refills | Status: DC

## 2017-12-02 ENCOUNTER — Encounter: Payer: Self-pay | Admitting: Nurse Practitioner

## 2017-12-02 ENCOUNTER — Ambulatory Visit (INDEPENDENT_AMBULATORY_CARE_PROVIDER_SITE_OTHER): Payer: BLUE CROSS/BLUE SHIELD | Admitting: Nurse Practitioner

## 2017-12-02 ENCOUNTER — Other Ambulatory Visit (HOSPITAL_COMMUNITY)
Admission: RE | Admit: 2017-12-02 | Discharge: 2017-12-02 | Disposition: A | Discharge status: Home / Self Care | Payer: BLUE CROSS/BLUE SHIELD | Source: Ambulatory Visit | Attending: Nurse Practitioner | Admitting: Nurse Practitioner

## 2017-12-02 VITALS — BP 114/77 | HR 77 | Ht 62.6 in | Wt 180.2 lb

## 2017-12-02 DIAGNOSIS — Z0000 Encounter for general adult medical examination without abnormal findings: Secondary | ICD-10-CM | POA: Diagnosis present

## 2017-12-02 DIAGNOSIS — Z23 Encounter for immunization: Secondary | ICD-10-CM | POA: Diagnosis not present

## 2017-12-02 DIAGNOSIS — G4701 Insomnia due to medical condition: Secondary | ICD-10-CM | POA: Diagnosis not present

## 2017-12-02 DIAGNOSIS — F418 Other specified anxiety disorders: Secondary | ICD-10-CM

## 2017-12-02 DIAGNOSIS — N644 Mastodynia: Secondary | ICD-10-CM | POA: Diagnosis not present

## 2017-12-02 DIAGNOSIS — G4700 Insomnia, unspecified: Secondary | ICD-10-CM | POA: Insufficient documentation

## 2017-12-02 DIAGNOSIS — F5105 Insomnia due to other mental disorder: Secondary | ICD-10-CM | POA: Insufficient documentation

## 2017-12-02 DIAGNOSIS — G35 Multiple sclerosis: Secondary | ICD-10-CM

## 2017-12-02 LAB — PREGNANCY, URINE: Preg Test, Ur: NEGATIVE

## 2017-12-02 MED ORDER — TRAZODONE HCL 50 MG PO TABS: 50.0000 mg | ORAL_TABLET | Freq: Every day | ORAL | 3 refills | Status: DC

## 2017-12-02 MED ORDER — SERTRALINE HCL 100 MG PO TABS: 100.0000 mg | ORAL_TABLET | Freq: Every day | ORAL | 3 refills | Status: DC

## 2017-12-02 NOTE — Assessment & Plan Note (Signed)
Chronic, ongoing secondary to MS.  Continue Trazodone.  Refill ordered.

## 2017-12-02 NOTE — Assessment & Plan Note (Addendum)
New onset.  Pregnancy test negative.  Has IUD.  FSH/LH sent to lab.

## 2017-12-02 NOTE — Assessment & Plan Note (Signed)
Chronic, ongoing secondary to MS.  Continue Zoloft.  Refill ordered.

## 2017-12-02 NOTE — Progress Notes (Signed)
BP 114/77 (BP Location: Left Arm, Patient Position: Sitting, Cuff Size: Normal)   Pulse 77   Ht 5' 2.6" (1.59 m)   Wt 180 lb 4 oz (81.8 kg)   SpO2 100%   BMI 32.34 kg/m    Subjective:    Patient ID: [name], female    DOB: 1980/12/09, 37 y.o.   MRN: [id number]  HPI: [name] is a 37 y.o. female presents for annual physical  Chief Complaint  Patient presents with  . Annual Exam  . Insomnia  . Depression    MS: Followed by neuro.  Next transfusion is the Friday before Thanksgiving.  Reports some increased fatigue at this time, which she states is common as it gets closer to her transfusion time.  Report adequate symptom control on current regimen.  INSOMNIA: Reports good control and adequate sleep pattern with Trazodone.  Has not seen psychiatry for some time and would like medication managed by PCP.  Reports getting 7-8 hours sleep a night with no wakefulness.  Good control with current med regimen.  DEPRESSION: Reports good control with Zoloft.  No longer seeing psychiatry.  Denies depressed mood or SI/HI.  States her mood has much improved on current med regimen.    Office Visit from 12/02/2017 in [facility]  PHQ-9 Total Score  9      BREAST TENDERNESS: Has IUD, was placed in February 2016 at [facility] by [facility] CNM. Has sporadic bleeding, but no actual period since that time.  Noticed some breast tenderness last week, which she has not had in years.  Also report some hot flashes. She is currently sexually active with her fiance and reports her previous pregnancies have been while on BCP.  Has some concerns for pregnancy, as she is on MS medications and has concerns about effect on pregnancy if present.    Relevant past medical, surgical, family and social history reviewed and updated as indicated. Interim medical history since our last visit reviewed. Allergies and medications reviewed and updated.  Review of Systems  Constitutional:  Negative for activity change, appetite change and fatigue.  HENT: Negative.   Eyes: Negative.   Respiratory: Negative for cough, chest tightness and shortness of breath.   Cardiovascular: Negative for chest pain, palpitations and leg swelling.  Gastrointestinal: Negative for abdominal distention, abdominal pain, constipation, diarrhea, nausea and vomiting.  Endocrine: Negative for cold intolerance, heat intolerance, polydipsia, polyphagia and polyuria.  Genitourinary: Negative for vaginal bleeding, vaginal discharge and vaginal pain.  Musculoskeletal: Negative.   Skin: Negative.   Allergic/Immunologic: Negative.   Neurological: Negative for dizziness, tremors, syncope, speech difficulty, weakness, light-headedness, numbness and headaches.  Psychiatric/Behavioral: Negative for behavioral problems, confusion, decreased concentration and suicidal ideas. The patient is not nervous/anxious.     Per HPI unless specifically indicated above     Objective:    BP 114/77 (BP Location: Left Arm, Patient Position: Sitting, Cuff Size: Normal)   Pulse 77   Ht 5' 2.6" (1.59 m)   Wt 180 lb 4 oz (81.8 kg)   SpO2 100%   BMI 32.34 kg/m   Wt Readings from Last 3 Encounters:  12/02/17 180 lb 4 oz (81.8 kg)  11/18/17 180 lb (81.6 kg)  09/08/17 177 lb 8 oz (80.5 kg)    Physical Exam  Constitutional: She is oriented to person, place, and time. She appears well-developed and well-nourished.  HENT:  Head: Normocephalic and atraumatic.  Right Ear: External ear normal.  Left Ear: External  ear normal.  Nose: Nose normal.  Mouth/Throat: Oropharynx is clear and moist.  Eyes: Pupils are equal, round, and reactive to light. Conjunctivae and EOM are normal.  Neck: Normal range of motion. Neck supple. No JVD present. Carotid bruit is not present. No thyromegaly present.  Cardiovascular: Normal rate, regular rhythm, normal heart sounds and intact distal pulses.  Pulmonary/Chest: Effort normal and breath sounds  normal. Right breast exhibits tenderness. Right breast exhibits no inverted nipple, no mass, no nipple discharge and no skin change. Left breast exhibits tenderness. Left breast exhibits no inverted nipple, no mass, no nipple discharge and no skin change.  Abdominal: Soft. Bowel sounds are normal. There is no splenomegaly or hepatomegaly.  Genitourinary: Vagina normal and uterus normal. Pelvic exam was performed with patient supine. There is no rash or tenderness on the right labia. There is no rash or tenderness on the left labia. Cervix exhibits no motion tenderness and no discharge. Right adnexum displays no mass and no tenderness. Left adnexum displays no mass and no tenderness. No vaginal discharge found.  Genitourinary Comments: IUD strings visualized.  Musculoskeletal: Normal range of motion.  Lymphadenopathy:    She has no cervical adenopathy.  Neurological: She is alert and oriented to person, place, and time. She has normal strength and normal reflexes. No cranial nerve deficit. She displays a negative Romberg sign.  Reflex Scores:      Brachioradialis reflexes are 2+ on the right side and 2+ on the left side.      Patellar reflexes are 2+ on the right side and 2+ on the left side. Skin: Skin is warm and dry.  Psychiatric: She has a normal mood and affect. Her behavior is normal. Judgment and thought content normal.     Results for orders placed or performed in visit on 09/08/17  CBC with Differential/Platelet  Result Value Ref Range   WBC 9.7 3.4 - 10.8 x10E3/uL   RBC 5.14 3.77 - 5.28 x10E6/uL   Hemoglobin 14.5 11.1 - 15.9 g/dL   Hematocrit 40.9 81.1 - 46.6 %   MCV 84 79 - 97 fL   MCH 28.2 26.6 - 33.0 pg   MCHC 33.6 31.5 - 35.7 g/dL   RDW 91.4 78.2 - 95.6 %   Platelets 419 150 - 450 x10E3/uL   Neutrophils 76 Not Estab. %   Lymphs 16 Not Estab. %   Monocytes 7 Not Estab. %   Eos 1 Not Estab. %   Basos 0 Not Estab. %   Neutrophils Absolute 7.4 (H) 1.4 - 7.0 x10E3/uL    Lymphocytes Absolute 1.5 0.7 - 3.1 x10E3/uL   Monocytes Absolute 0.7 0.1 - 0.9 x10E3/uL   EOS (ABSOLUTE) 0.1 0.0 - 0.4 x10E3/uL   Basophils Absolute 0.0 0.0 - 0.2 x10E3/uL   Immature Granulocytes 0 Not Estab. %   Immature Grans (Abs) 0.0 0.0 - 0.1 x10E3/uL  Comprehensive metabolic panel  Result Value Ref Range   Glucose 92 65 - 99 mg/dL   BUN 15 6 - 20 mg/dL   Creatinine, Ser 2.13 0.57 - 1.00 mg/dL   GFR calc non Af Amer 99 >59 mL/min/1.73   GFR calc Af Amer 114 >59 mL/min/1.73   BUN/Creatinine Ratio 20 9 - 23   Sodium 142 134 - 144 mmol/L   Potassium 3.8 3.5 - 5.2 mmol/L   Chloride 103 96 - 106 mmol/L   CO2 22 20 - 29 mmol/L   Calcium 9.6 8.7 - 10.2 mg/dL   Total Protein 7.0 6.0 -  8.5 g/dL   Albumin 4.9 3.5 - 5.5 g/dL   Globulin, Total 2.1 1.5 - 4.5 g/dL   Albumin/Globulin Ratio 2.3 (H) 1.2 - 2.2   Bilirubin Total 0.3 0.0 - 1.2 mg/dL   Alkaline Phosphatase 91 39 - 117 IU/L   AST 19 0 - 40 IU/L   ALT 14 0 - 32 IU/L  TSH  Result Value Ref Range   TSH 1.240 0.450 - 4.500 uIU/mL  T4  Result Value Ref Range   T4, Total 7.6 4.5 - 12.0 ug/dL      Assessment & Plan:   Problem List Items Addressed This Visit      Nervous and Auditory   Relapsing remitting multiple sclerosis (HCC)    Chronic, ongoing.  Continue to collaborate with Dr. Epimenio Foot.        Other   Depression with anxiety    Chronic, ongoing secondary to MS.  Continue Zoloft.  Refill ordered.      Relevant Medications   traZODone (DESYREL) 50 MG tablet   sertraline (ZOLOFT) 100 MG tablet   Breast tenderness in female    New onset.  Pregnancy test negative.  Has IUD.  FSH/LH sent to lab.      Relevant Orders   Pregnancy, urine   Insomnia    Chronic, ongoing secondary to MS.  Continue Trazodone.  Refill ordered.       Other Visit Diagnoses    Annual physical exam    -  Primary   Annual labs performed.   Relevant Orders   Lipid Profile   HIV antibody (with reflex)   FSH   LH   Cytology - PAP    Immunization due       Relevant Orders   Flu Vaccine QUAD 6+ mos PF IM (Fluarix Quad PF) (Completed)       Follow up plan: Return in about 6 months (around 06/03/2018) for depression and MS.

## 2017-12-02 NOTE — Assessment & Plan Note (Signed)
Chronic, ongoing.  Continue to collaborate with Dr. Epimenio Foot.

## 2017-12-02 NOTE — Patient Instructions (Signed)

## 2017-12-03 LAB — LIPID PANEL
CHOL/HDL RATIO: 2.6 ratio (ref 0.0–4.4)
Cholesterol, Total: 147 mg/dL (ref 100–199)
HDL: 57 mg/dL (ref >39)
LDL Calculated: 63 mg/dL (ref 0–99)
TRIGLYCERIDES: 136 mg/dL (ref 0–149)
VLDL Cholesterol Cal: 27 mg/dL (ref 5–40)

## 2017-12-03 LAB — LUTEINIZING HORMONE: LH: 7 m[IU]/mL

## 2017-12-03 LAB — FOLLICLE STIMULATING HORMONE: FSH: 3.2 m[IU]/mL

## 2017-12-03 LAB — HIV ANTIBODY (ROUTINE TESTING W REFLEX): HIV SCREEN 4TH GENERATION: NONREACTIVE

## 2017-12-04 NOTE — Progress Notes (Signed)
Complete and updated

## 2017-12-05 LAB — CYTOLOGY - PAP
ADEQUACY: ABSENT
Diagnosis: NEGATIVE

## 2017-12-17 ENCOUNTER — Ambulatory Visit: Payer: BLUE CROSS/BLUE SHIELD | Admitting: Podiatry

## 2017-12-22 ENCOUNTER — Telehealth: Payer: Self-pay

## 2017-12-22 ENCOUNTER — Other Ambulatory Visit: Payer: Self-pay | Admitting: Nurse Practitioner

## 2017-12-22 MED ORDER — SERTRALINE HCL 50 MG PO TABS: 50.0000 mg | ORAL_TABLET | Freq: Every day | ORAL | 3 refills | Status: DC

## 2017-12-22 MED ORDER — SERTRALINE HCL 100 MG PO TABS: 100.0000 mg | ORAL_TABLET | Freq: Every day | ORAL | 3 refills | Status: DC

## 2017-12-22 NOTE — Telephone Encounter (Signed)
Tried calling patient, no answer.  LVM for patient to return phone call. 

## 2017-12-22 NOTE — Progress Notes (Signed)
Patient requesting script refill for Sertraline.  Current daily dose is 150MG  and not 100MG .  Script sent to pharamacy for Sertraline 50 MG + Sertraline 100 MG for total 150 MG daily.  90 day supply with x 3 refill.

## 2017-12-22 NOTE — Telephone Encounter (Signed)
I have fixed this so she gets a 50 MG script and a 100 MG script since there is no 150 MG pill.  Sent to pharmacy.

## 2017-12-22 NOTE — Telephone Encounter (Signed)
Fax from pharmacy.  Patient requests new prescription. Patient states she is taking 150mg  daily not 100mg .

## 2017-12-22 NOTE — Telephone Encounter (Signed)
My apologies, I thought I selected reorder on the medication.   The medication is Sertraline.

## 2017-12-22 NOTE — Telephone Encounter (Signed)
Which medication and I will refill and change.

## 2017-12-24 ENCOUNTER — Other Ambulatory Visit: Payer: Self-pay

## 2017-12-25 MED ORDER — AMPHETAMINE-DEXTROAMPHET ER 30 MG PO CP24: 30.0000 mg | ORAL_CAPSULE | Freq: Every day | ORAL | 0 refills | Status: DC

## 2017-12-25 NOTE — Telephone Encounter (Signed)
[place] Database Verified LR: 12-01-17 Qty: 30

## 2017-12-31 ENCOUNTER — Telehealth: Payer: Self-pay | Admitting: *Deleted

## 2017-12-31 NOTE — Telephone Encounter (Signed)
Received PA request for generic adderall 30mg  XR. I attempted to start on CMM however it asked why pt could not take brand name. I called [facility] and they re-submitted claim for brand. Brand name covered for 10.00. They will run for brand name. Nothing further needed.

## 2017-12-31 NOTE — Telephone Encounter (Signed)
Patient was notified of her medication the same day, but I did not document. Patient was notified of her medication.

## 2018-01-13 ENCOUNTER — Other Ambulatory Visit: Payer: Self-pay | Admitting: Neurology

## 2018-01-19 ENCOUNTER — Encounter: Payer: Self-pay | Admitting: Unknown Physician Specialty

## 2018-01-19 ENCOUNTER — Ambulatory Visit: Payer: BLUE CROSS/BLUE SHIELD | Admitting: Unknown Physician Specialty

## 2018-01-19 VITALS — BP 125/85 | HR 112 | Temp 98.9°F | Ht 62.6 in | Wt 181.0 lb

## 2018-01-19 DIAGNOSIS — R05 Cough: Secondary | ICD-10-CM

## 2018-01-19 DIAGNOSIS — B029 Zoster without complications: Secondary | ICD-10-CM | POA: Diagnosis not present

## 2018-01-19 DIAGNOSIS — R059 Cough, unspecified: Secondary | ICD-10-CM

## 2018-01-19 LAB — VERITOR FLU A/B WAIVED
INFLUENZA A: NEGATIVE
Influenza B: NEGATIVE

## 2018-01-19 MED ORDER — VALACYCLOVIR HCL 1 G PO TABS: 1000.0000 mg | ORAL_TABLET | Freq: Three times a day (TID) | ORAL | 0 refills | Status: DC

## 2018-01-19 MED ORDER — BETAMETHASONE DIPROPIONATE 0.05 % EX CREA: TOPICAL_CREAM | Freq: Two times a day (BID) | CUTANEOUS | 0 refills | Status: DC

## 2018-01-19 MED ORDER — BENZONATATE 100 MG PO CAPS: 100.0000 mg | ORAL_CAPSULE | Freq: Two times a day (BID) | ORAL | 0 refills | Status: DC | PRN

## 2018-01-19 NOTE — Progress Notes (Signed)
BP 125/85 (BP Location: Left Arm, Patient Position: Sitting, Cuff Size: Normal)   Pulse (!) 112   Temp 98.9 F (37.2 C)   Ht 5' 2.6" (1.59 m)   Wt 181 lb (82.1 kg)   SpO2 100%   BMI 32.47 kg/m    Subjective:    Patient ID: [name], female    DOB: 1980/04/14, 37 y.o.   MRN: [id number]  HPI: [name] is a 37 y.o. female  Chief Complaint  Patient presents with  . Cough    X 4 days, generally not feeling well  . Rash    X 1 week, back of left leg and buttucks   Cough Pt is here with a dry cough for 3 weeks.  States it is "like a tickle" in the back of throat.  Cough is dry. Does sinus rinses but not recently.  Tried potassium without improvement.    Rash Came up 3-4 days ago on areas described above.  Skin hypersensitive.  Last steroid     Significant history of MS.  Getting MS treatments with Solu-medrol  Last treatment with before Christmas.    Relevant past medical, surgical, family and social history reviewed and updated as indicated. Interim medical history since our last visit reviewed. Allergies and medications reviewed and updated.  Review of Systems  Constitutional: Negative.  Negative for appetite change, chills and fever.  HENT: Negative.   Respiratory: Negative.   Cardiovascular: Negative.   Gastrointestinal: Negative.   Genitourinary: Negative.   Neurological: Negative.   Psychiatric/Behavioral: Negative.     Per HPI unless specifically indicated above     Objective:    BP 125/85 (BP Location: Left Arm, Patient Position: Sitting, Cuff Size: Normal)   Pulse (!) 112   Temp 98.9 F (37.2 C)   Ht 5' 2.6" (1.59 m)   Wt 181 lb (82.1 kg)   SpO2 100%   BMI 32.47 kg/m   Wt Readings from Last 3 Encounters:  01/19/18 181 lb (82.1 kg)  12/02/17 180 lb 4 oz (81.8 kg)  11/18/17 180 lb (81.6 kg)    Physical Exam Vitals signs and nursing note reviewed.  Constitutional:      General: She is not in acute distress.    Appearance: Normal  appearance. She is well-developed.  HENT:     Head: Normocephalic and atraumatic.     Right Ear: Tympanic membrane and ear canal normal.     Left Ear: Tympanic membrane and ear canal normal.     Nose: No rhinorrhea.     Right Sinus: No maxillary sinus tenderness or frontal sinus tenderness.     Left Sinus: No maxillary sinus tenderness or frontal sinus tenderness.     Mouth/Throat:     Pharynx: No posterior oropharyngeal erythema.  Eyes:     General: Lids are normal. No scleral icterus.       Right eye: No discharge.        Left eye: No discharge.     Conjunctiva/sclera: Conjunctivae normal.  Cardiovascular:     Rate and Rhythm: Normal rate and regular rhythm.  Pulmonary:     Effort: Pulmonary effort is normal. No respiratory distress.     Breath sounds: No rhonchi or rales.  Abdominal:     Palpations: There is no hepatomegaly or splenomegaly.  Musculoskeletal: Normal range of motion.  Skin:    Coloration: Skin is not pale.     Findings: Rash present. Rash is vesicular.  Comments: Vesicular rash left buttock and back of thigh in dermatomal distribution  Neurological:     Mental Status: She is alert and oriented to person, place, and time.  Psychiatric:        Behavior: Behavior normal.        Thought Content: Thought content normal.        Judgment: Judgment normal.     Results for orders placed or performed in visit on 12/02/17  Lipid Profile  Result Value Ref Range   Cholesterol, Total 147 100 - 199 mg/dL   Triglycerides 161 0 - 149 mg/dL   HDL 57 >09 mg/dL   VLDL Cholesterol Cal 27 5 - 40 mg/dL   LDL Calculated 63 0 - 99 mg/dL   Chol/HDL Ratio 2.6 0.0 - 4.4 ratio  HIV antibody (with reflex)  Result Value Ref Range   HIV Screen 4th Generation wRfx Non Reactive Non Reactive  FSH  Result Value Ref Range   FSH 3.2 mIU/mL  LH  Result Value Ref Range   LH 7.0 mIU/mL  Pregnancy, urine  Result Value Ref Range   Preg Test, Ur Negative Negative  Cytology - PAP    Result Value Ref Range   Adequacy      Satisfactory for evaluation  endocervical/transformation zone component ABSENT.   Diagnosis      NEGATIVE FOR INTRAEPITHELIAL LESIONS OR MALIGNANCY.   Diagnosis      FUNGAL ORGANISMS PRESENT CONSISTENT WITH CANDIDA SPP.   Material Submitted CervicoVaginal Pap [ThinPrep Imaged]    CYTOLOGY - PAP PAP RESULT       Assessment & Plan:   Problem List Items Addressed This Visit    None    Visit Diagnoses    Cough    -  Primary   suspect viral.  Supportive care with Tessalon perles   Relevant Orders   Veritor Flu A/B Waived   Herpes zoster without complication       Pt ed on shingles.  Handout given.  Valtrex 1 gram TID for 7 days.  High dose steroid cream.   Relevant Medications   valACYclovir (VALTREX) 1000 MG tablet       Follow up plan: Return if symptoms worsen or fail to improve.

## 2018-01-19 NOTE — Patient Instructions (Signed)
Shingles Shingles, which is also known as herpes zoster, is an infection that causes a painful skin rash and fluid-filled blisters. Shingles is not related to genital herpes, which is a sexually transmitted infection. Shingles only develops in people who:  Have had chickenpox.  Have received the chickenpox vaccine. (This is rare.)  What are the causes? Shingles is caused by varicella-zoster virus (VZV). This is the same virus that causes chickenpox. After exposure to VZV, the virus stays in the body in an inactive (dormant) state. Shingles develops if the virus reactivates. This can happen many years after the initial exposure to VZV. It is not known what causes this virus to reactivate. What increases the risk? People who have had chickenpox or received the chickenpox vaccine are at risk for shingles. Infection is more common in people who:  Are older than age 50.  Have a weakened defense (immune) system, such as those with HIV, AIDS, or cancer.  Are taking medicines that weaken the immune system, such as transplant medicines.  Are under great stress.  What are the signs or symptoms? Early symptoms of this condition include itching, tingling, and pain in an area on your skin. Pain may be described as burning, stabbing, or throbbing. A few days or weeks after symptoms start, a painful red rash appears, usually on one side of the body in a bandlike or beltlike pattern. The rash eventually turns into fluid-filled blisters that break open, scab over, and dry up in about 2-3 weeks. At any time during the infection, you may also develop:  A fever.  Chills.  A headache.  An upset stomach.  How is this diagnosed? This condition is diagnosed with a skin exam. Sometimes, skin or fluid samples are taken from the blisters before a diagnosis is made. These samples are examined under a microscope or sent to a lab for testing. How is this treated? There is no specific cure for this condition.  Your health care provider will probably prescribe medicines to help you manage pain, recover more quickly, and avoid long-term problems. Medicines may include:  Antiviral drugs.  Anti-inflammatory drugs.  Pain medicines.  If the area involved is on your face, you may be referred to a specialist, such as an eye doctor (ophthalmologist) or an ear, nose, and throat (ENT) doctor to help you avoid eye problems, chronic pain, or disability. Follow these instructions at home: Medicines  Take medicines only as directed by your health care provider.  Apply an anti-itch or numbing cream to the affected area as directed by your health care provider. Blister and Rash Care  Take a cool bath or apply cool compresses to the area of the rash or blisters as directed by your health care provider. This may help with pain and itching.  Keep your rash covered with a loose bandage (dressing). Wear loose-fitting clothing to help ease the pain of material rubbing against the rash.  Keep your rash and blisters clean with mild soap and cool water or as directed by your health care provider.  Check your rash every day for signs of infection. These include redness, swelling, and pain that lasts or increases.  Do not pick your blisters.  Do not scratch your rash. General instructions  Rest as directed by your health care provider.  Keep all follow-up visits as directed by your health care provider. This is important.  Until your blisters scab over, your infection can cause chickenpox in people who have never had it or been vaccinated   against it. To prevent this from happening, avoid contact with other people, especially: ? Babies. ? Pregnant women. ? Children who have eczema. ? Elderly people who have transplants. ? People who have chronic illnesses, such as leukemia or AIDS. Contact a health care provider if:  Your pain is not relieved with prescribed medicines.  Your pain does not get better after  the rash heals.  Your rash looks infected. Signs of infection include redness, swelling, and pain that lasts or increases. Get help right away if:  The rash is on your face or nose.  You have facial pain, pain around your eye area, or loss of feeling on one side of your face.  You have ear pain or you have ringing in your ear.  You have loss of taste.  Your condition gets worse. This information is not intended to replace advice given to you by your health care provider. Make sure you discuss any questions you have with your health care provider. Document Released: 01/21/2005 Document Revised: 09/17/2015 Document Reviewed: 12/02/2013 Elsevier Interactive Patient Education  2018 [employer]  

## 2018-01-23 ENCOUNTER — Encounter: Payer: Self-pay | Admitting: Nurse Practitioner

## 2018-01-23 ENCOUNTER — Ambulatory Visit
Admission: RE | Admit: 2018-01-23 | Discharge: 2018-01-23 | Disposition: A | Discharge status: Home / Self Care | Payer: BLUE CROSS/BLUE SHIELD | Source: Ambulatory Visit | Attending: Nurse Practitioner | Admitting: Nurse Practitioner

## 2018-01-23 ENCOUNTER — Ambulatory Visit: Payer: BLUE CROSS/BLUE SHIELD | Admitting: Nurse Practitioner

## 2018-01-23 VITALS — BP 111/77 | HR 111 | Temp 98.2°F | Ht 62.0 in | Wt 178.0 lb

## 2018-01-23 DIAGNOSIS — R05 Cough: Secondary | ICD-10-CM

## 2018-01-23 DIAGNOSIS — J101 Influenza due to other identified influenza virus with other respiratory manifestations: Secondary | ICD-10-CM | POA: Diagnosis not present

## 2018-01-23 DIAGNOSIS — R059 Cough, unspecified: Secondary | ICD-10-CM

## 2018-01-23 LAB — VERITOR FLU A/B WAIVED
INFLUENZA B: POSITIVE — AB
Influenza A: NEGATIVE

## 2018-01-23 MED ORDER — TRAZODONE HCL 50 MG PO TABS: 50.0000 mg | ORAL_TABLET | Freq: Every day | ORAL | 3 refills | Status: DC

## 2018-01-23 MED ORDER — OSELTAMIVIR PHOSPHATE 75 MG PO CAPS: 75.0000 mg | ORAL_CAPSULE | Freq: Two times a day (BID) | ORAL | 0 refills | Status: AC

## 2018-01-23 NOTE — Patient Instructions (Signed)

## 2018-01-23 NOTE — Progress Notes (Signed)
BP 111/77 (BP Location: Left Arm, Patient Position: Sitting, Cuff Size: Normal)   Pulse (!) 111   Temp 98.2 F (36.8 C) (Oral)   Ht 5\' 2"  (1.575 m)   Wt 178 lb (80.7 kg)   SpO2 98%   BMI 32.56 kg/m    Subjective:    Patient ID: [name], female    DOB: 06/20/1980, 37 y.o.   MRN: [id number]  HPI: [name] is a 37 y.o. female presents for URI  Chief Complaint  Patient presents with  . Herpes Zoster    Was here on 01/19/2018. Patient states she's worse.  . Nasal Congestion  . Cough  . Generalized Body Aches   UPPER RESPIRATORY TRACT INFECTION & SHINGLES Treated on 01/19/18 for shingles with 7 days of Valtrex and steroid cream.  Flu negative at the time.  Reports symptoms have become worse.  Received last MS treatment after Thanksgiving.  She reports worsening URI symptoms and overall myalgia.  States she contacted her neurologist for recommendations, she showed message to provider and they recommend CXR at this time due to risk with recent treatment.  She reports overall improvement with shingles, denies pain or drainage.  States areas have lessened.   Worst symptom: Fever: yes Cough: yes, from nasal drainage Shortness of breath: no Wheezing: no Chest pain: no Chest tightness: no Chest congestion: no Nasal congestion: yes Runny nose: yes Post nasal drip: yes Sneezing: yes Sore throat: no Swollen glands: yes Sinus pressure: yes Headache: yes Face pain: no Toothache: no Ear pain: none Ear pressure: none Eyes red/itching: none Eye drainage/crusting: none Vomiting: no Rash: no Fatigue: yes Sick contacts: no Strep contacts: no  Context: worse Recurrent sinusitis: no Relief with OTC cold/cough medications: no  Treatments attempted: cold/sinus and mucinex   Relevant past medical, surgical, family and social history reviewed and updated as indicated. Interim medical history since our last visit reviewed. Allergies and medications reviewed and  updated.  Review of Systems  Constitutional: Positive for fatigue and fever. Negative for activity change, appetite change and diaphoresis.  HENT: Positive for congestion, postnasal drip, rhinorrhea, sinus pressure and sneezing. Negative for ear discharge, ear pain, facial swelling, sinus pain, sore throat and voice change.   Eyes: Negative for itching and visual disturbance.  Respiratory: Positive for cough. Negative for chest tightness, shortness of breath and wheezing.   Cardiovascular: Negative for chest pain, palpitations and leg swelling.  Gastrointestinal: Negative for abdominal distention, abdominal pain, constipation, diarrhea, nausea and vomiting.  Skin: Positive for rash (improved per patient to posterior left thigh upper).  Neurological: Negative for dizziness, numbness and headaches.  Psychiatric/Behavioral: Negative.     Per HPI unless specifically indicated above     Objective:    BP 111/77 (BP Location: Left Arm, Patient Position: Sitting, Cuff Size: Normal)   Pulse (!) 111   Temp 98.2 F (36.8 C) (Oral)   Ht 5\' 2"  (1.575 m)   Wt 178 lb (80.7 kg)   SpO2 98%   BMI 32.56 kg/m   Wt Readings from Last 3 Encounters:  01/23/18 178 lb (80.7 kg)  01/19/18 181 lb (82.1 kg)  12/02/17 180 lb 4 oz (81.8 kg)    Physical Exam Vitals signs and nursing note reviewed.  Constitutional:      General: She is awake.     Appearance: She is well-developed. She is ill-appearing.  HENT:     Head: Normocephalic. No raccoon eyes.     Right Ear: Hearing, ear  canal and external ear normal. A middle ear effusion is present.     Left Ear: Hearing, ear canal and external ear normal. A middle ear effusion is present.     Nose: Mucosal edema and rhinorrhea present. Rhinorrhea is clear.     Right Sinus: No maxillary sinus tenderness or frontal sinus tenderness.     Left Sinus: No maxillary sinus tenderness or frontal sinus tenderness.     Mouth/Throat:     Mouth: Mucous membranes are  moist.     Pharynx: Oropharynx is clear. No oropharyngeal exudate or posterior oropharyngeal erythema.  Eyes:     General:        Right eye: No discharge.        Left eye: No discharge.     Conjunctiva/sclera:     Right eye: Right conjunctiva is injected.     Left eye: Left conjunctiva is injected.     Pupils: Pupils are equal, round, and reactive to light.  Neck:     Musculoskeletal: Normal range of motion and neck supple.     Thyroid: No thyromegaly.     Vascular: No carotid bruit or JVD.  Cardiovascular:     Rate and Rhythm: Normal rate and regular rhythm.     Heart sounds: Normal heart sounds.  Pulmonary:     Effort: Pulmonary effort is normal.     Breath sounds: Normal breath sounds.     Comments: Clear throughout Abdominal:     General: Bowel sounds are normal.     Palpations: Abdomen is soft.  Lymphadenopathy:     Head:     Right side of head: Submandibular adenopathy present. No submental or tonsillar adenopathy.     Left side of head: Submandibular adenopathy present. No submental or tonsillar adenopathy.     Cervical: No cervical adenopathy.  Skin:    General: Skin is warm and dry.     Findings: Rash present. Rash is vesicular.     Comments: Rash with crusting present to left buttock and upper left thigh, no vesicles or drainage.  Dermatomal distribution.  Neurological:     Mental Status: She is alert and oriented to person, place, and time.  Psychiatric:        Mood and Affect: Mood normal.        Behavior: Behavior normal. Behavior is cooperative.        Thought Content: Thought content normal.        Judgment: Judgment normal.     Results for orders placed or performed in visit on 01/23/18  Veritor Flu A/B Waived  Result Value Ref Range   Influenza A Negative Negative   Influenza B Positive (A) Negative      Assessment & Plan:   Problem List Items Addressed This Visit      Respiratory   Influenza due to influenza virus, type B - Primary     Influenza B positive, A negative.  Initiate Tamiflu 75 MG PO BID x 5 days.  Encouraged time off work until after Christmas, rest, and increase hydration.  May use Tylenol for fever, Mucinex for cough, and Claritin for nasal drainage.  Avoid exposures, recommended to stay at home over weekend.  CXR ordered per neuro recommendation to r/o PNA.      Relevant Medications   oseltamivir (TAMIFLU) 75 MG capsule   Other Relevant Orders   Veritor Flu A/B Waived (Completed)    Other Visit Diagnoses    Cough  Contine Mucinex at home.  Tamiflu script   Relevant Orders   Veritor Flu A/B Waived (Completed)   DG Chest 2 View       Follow up plan: Return if symptoms worsen or fail to improve.

## 2018-01-23 NOTE — Assessment & Plan Note (Signed)
Influenza B positive, A negative.  Initiate Tamiflu 75 MG PO BID x 5 days.  Encouraged time off work until after Christmas, rest, and increase hydration.  May use Tylenol for fever, Mucinex for cough, and Claritin for nasal drainage.  Avoid exposures, recommended to stay at home over weekend.  CXR ordered per neuro recommendation to r/o PNA.

## 2018-02-02 ENCOUNTER — Other Ambulatory Visit: Payer: Self-pay

## 2018-02-02 MED ORDER — AMPHETAMINE-DEXTROAMPHET ER 30 MG PO CP24: 30.0000 mg | ORAL_CAPSULE | Freq: Every day | ORAL | 0 refills | Status: DC

## 2018-02-02 NOTE — Telephone Encounter (Signed)
[place] Database Verified LR 12-31-17 Qty: 30 Pending appointment 02-18-2018

## 2018-02-18 ENCOUNTER — Encounter: Payer: Self-pay | Admitting: Neurology

## 2018-02-18 ENCOUNTER — Ambulatory Visit: Payer: PRIVATE HEALTH INSURANCE | Admitting: Neurology

## 2018-02-18 VITALS — BP 124/76 | HR 123 | Ht 62.0 in | Wt 181.5 lb

## 2018-02-18 DIAGNOSIS — F418 Other specified anxiety disorders: Secondary | ICD-10-CM

## 2018-02-18 DIAGNOSIS — G43009 Migraine without aura, not intractable, without status migrainosus: Secondary | ICD-10-CM

## 2018-02-18 DIAGNOSIS — Z79899 Other long term (current) drug therapy: Secondary | ICD-10-CM | POA: Diagnosis not present

## 2018-02-18 DIAGNOSIS — F988 Other specified behavioral and emotional disorders with onset usually occurring in childhood and adolescence: Secondary | ICD-10-CM

## 2018-02-18 DIAGNOSIS — J101 Influenza due to other identified influenza virus with other respiratory manifestations: Secondary | ICD-10-CM | POA: Diagnosis not present

## 2018-02-18 DIAGNOSIS — G35 Multiple sclerosis: Secondary | ICD-10-CM | POA: Diagnosis not present

## 2018-02-18 MED ORDER — AZITHROMYCIN 250 MG PO TABS: ORAL_TABLET | ORAL | 0 refills | Status: DC

## 2018-02-18 NOTE — Progress Notes (Signed)
[facility]  PATIENT: [name] DOB: 1980-11-24  REFERRING DOCTOR OR PCP:  Dr. Jennings Books  [facility]) SOURCE: Patient, a couple MRI reports and images on PACS. Lab reports, notes from Dr. Brigitte Pulse  _________________________________   HISTORICAL  CHIEF COMPLAINT:  Chief Complaint  Patient presents with  . Follow-up    RM 12, alone. Last seen 09/08/2017. She has been on brand adderall for at least a couple years.   . Multiple Sclerosis    On Ocrevus. Last infusion: November 2019. Due on May 2020.  Feeling better than she did yesterday. Feels like her whole body is vibrating. She feels like she is having sinus issues. She has a lot of pressure.     HISTORY OF PRESENT ILLNESS:  [name] is a 38 y.o. woman with relapsing remitting multiple sclerosis.     Update 02/18/2018: She is on Ocrevus.   She tolerates it well and has not had any exacerbations.  MRI 09/2017 showed no new MS lesions.  She did have shingles and influenza B illness a couple months ago.    Now is having sinus drainage/pressure.  Gait is about the same.   Balance is off but no worse than last visit.   She has right > left leg weakness and spasticity.   She takes baclofen 10 in am and 20 mg qHS, occ. taking one extra one..    Vision seems the same but she had a lot of difficulty seeing after hr eyes were dilated (eye check was fine).   Bladder function is ok.  She is on oxybutynin but has a dry mouth.   She is feeling more fatigued lately.   She takes Adderall XR 30 mg in the am and an additional 10 mg IR in the afternoon.   This helps with fatigue and attention.   Lots of anxiety this month with Wedding scheduled this weekend.   Depression ok.  She also notes a constant vibration sensation all over.  Also, she notes more word finding difficulty.    She feels she generally does worse in the heat.   She goes to a lot of nursing home/assisted living and they are often hot/humid.     Migraines are doing ok  on Topamax.   Update 09/08/2017: Over the last couple weeks, she has noted more neurologic symptoms including numbness and weakness in the legs, right greater than left.    Current symptoms started about 2 weeks ago.  She denies any viral or bacterial infections.     She is also noting pain in the right leg.    Initially, she had left facial numbness but that resolved aftr a few days.      Of note, she was doing painting about 3 weeks ago, a couple days before current symptoms started, and her leg was giving out and had some pain.     She has also noted more fatigue.  Adderall 30 mg in am and 10 mg at one pm has helped but she is noting more issues in the afternoons,   She is noting more verbal fluency issues.      Vision is fine.   She notes more bladder issues.   Oxybutynin causes dry mouth.  She had her last Ocrevus injection about 3 months ago and she is tolerating it well.    Her last MRI was 09/2016 and showed no new lesions.      Update 08/20/2017: She feels she has been stable  on Ocrevus without any recent exacerbation.    At the last visit she had had a cough without fever and CXR was fine.   Her gait is doing well and she rarely uses the walker (just if tired and walking outside).     She has not been able to afford Ampyra.      Her right leg is weaker and more spastic than her left leg.   She notes it more when standing and singing as she feels itr also affects her balance.   She takes baclofen 10 in am and 20 mg in evening.   Oxybutynin helps her bladder function.    She has some fatigue that is helped by Adderall.  She tolerates it well.  She is sleeping well most nights.  Mood is doing okay.  Cognition is okay with some attention deficit helped by the Adderall.  Migraines are doing fairly well.   She did better on topiramate but ran out a few months ago.    Update 02/21/2017: She is on Ocrevus and tolerates it well.  She has not had any exacerbations and feels a little bit better.  She  feels gait is better on Ampyra.   She now Hardly ever uses a walker though she still has to touch the walls for balance. Speed is greatly improved. The right leg is weaker and more spastic than the left leg. He is having less leg pain. Vision has been stable.   She has urinary urgency and frequency that is helped by the oxybutynin. However,  she notes more dry mouth.   We discussed oxybutynin is most likely to cause this if she can change it to an as-needed basis.   Headaches are better on TPM 50 mg.  she tolerates that well. She was on 100 but was able to reduce the dose. She continues to note fatigue and attention deficit. Adderall has helped this. Mood has done better recently. She reports sleeping well most nights but less so since night time baclofen was held.  . She takes trazodone.  From 10/16/2016:  MS:   She had an exacerbation in 2017 and early 2018 switch to ocrelizumab with her first doses in May 2018 (was on Gilenya).   She had another small exacerbation in July (right leg stiff/weak) and she received 3 days of IV site.  She went from using a cane to a walker for balance.  She tolerates the ocrelizumab well.     Gait/strength/sensatoin:  She is using a walker for long distances but is able to walk shorter distances without it.   Her right leg is weaker than her left and gets stiff.   It is painful when stiff.  Her foot drags on the right and catches the floor causing a stumble.   Gait worsenend with the exacerbation in 2017.  She was walking without a cane last year.    Baclofen has helped the spasticity slightly.   There is some allodynia in the legs at times and also the right arm..  We discussed Ampyra for her walking  Vision: She has numbness around her left eye and she feels there is reduced left vision with dots and squiggles in the vision.  The left eye also hurts, worse with movements.. She denies diplopia..  She denies vertigo.  HA:   Migraine headaches have done better on Topamax.   It does cause a dry mouth and change in taste. She now is just having a couple migraines a  month.   When present, they are located in the right forehead and sometimes also the right occiput.   When present, pain is throbbing.    When intense, she also has photophobia and phonophobia.   Moving worsens the pain. She takes NSAIDs with mild benefit..    Bladder:   She is doing better, oxybutynin has helped.   Fatigue/sleep: She has physical and cognitive fatigue. This starts in the mornings but is worse later in the day and if she gets hot. Sometimes she also feels sleepy. Adderall is helping some.   The higher dose of Adderall is only helping a bit.   She is on Adderall XR 30 mg in the am and 10 mg IR at 1 pm.  She sometimes has sleep onset and sleep maintenance insomnia.   Mood/cognition: She notes some depression and frustration.  She sees [employer].   She also has noted difficulty with cognitive skills over the past couple of years.     Her mom has MS and her MS stabilized after a bone marrow transplant for cancer.  MS history:   In late June or early July 2011, she had the onset of numbness that went up to her belly button. She presented to the emergency room. An MRI of the brain showed 1 periventricular focus with maybe a second subtle periventricular focus. An MRI of the cervical spine showed an enhancing lesion adjacent to C3-C4. The diagnosis of possible MS was made but the evidence was not enough to begin a disease modifying therapy. In 2015, she had the onset of similar symptoms and also had decreased ability to use the left hand. An MRI of the brain at that time showed several new lesions not present in 2011, including a focus in the right middle cerebellar peduncle additionally, she had a lumbar puncture consistent with MS. Because of her symptoms, MRI changes and lumbar puncture results, she was diagnosed with clinical definite relapsing remitting MS and was started on Tecfidera. She has  continued on Tecfidera. Over the past few months she has noted more difficulty with focus and concentration.     She also has had some change in vision more recently. Because of these newer symptoms, and MRI was repeated 04/27/2015. The MRI of the cervical spine showed no new lesions. though the MRI of the brain showed 2  lesions not present in 2015, one in the left juxtacortical white matter and one in the left deep white matter.  She had a small exacerbation in August 2017 and received IV SOlu-Medrol.   MRI cervical spine 2017 an MRI of the brain show two spinal plaques, a definite rightt middle cerebellar peduncle plaque and a possible right pontine plaque and several foci in the hemispheres  MRI 01/2016 showed no new lesions.  She is JCV Ab positive.      REVIEW OF SYSTEMS: Constitutional: No fevers, chills, sweats, or change in appetite.  She has fatigue and insomnia. Eyes: No visual changes, double vision, eye pain Ear, nose and throat: No hearing loss, ear pain, nasal congestion, sore throat Cardiovascular: No chest pain, palpitations Respiratory: No shortness of breath at rest or with exertion.   No wheezes GastrointestinaI: No nausea, vomiting, diarrhea, abdominal pain, fecal incontinence Genitourinary:   She notes frequency, some hesitancy at times and nocturia. Musculoskeletal: No neck pain, back pain Integumentary: No rash, pruritus, skin lesions Neurological: as above Psychiatric: as above Endocrine: No palpitations, diaphoresis, change in appetite, change in weigh or increased thirst  Hematologic/Lymphatic: No anemia, purpura, petechiae. Allergic/Immunologic: No itchy/runny eyes, nasal congestion, recent allergic reactions, rashes  ALLERGIES: No Known Allergies  HOME MEDICATIONS:  Current Outpatient Medications:  .  amphetamine-dextroamphetamine (ADDERALL XR) 30 MG 24 hr capsule, Take 1 capsule (30 mg total) by mouth daily., Disp: 30 capsule, Rfl: 0 .   amphetamine-dextroamphetamine (ADDERALL) 10 MG tablet, Take one or two po in the aftrernoon, Disp: 60 tablet, Rfl: 0 .  baclofen (LIORESAL) 10 MG tablet, TAKE 1 TABLET BY MOUTH THREE TIMES A DAY AS NEEDED FOR MUSCLE SPASMS, Disp: 270 tablet, Rfl: 1 .  betamethasone dipropionate (DIPROLENE) 0.05 % cream, Apply topically 2 (two) times daily., Disp: 30 g, Rfl: 0 .  Cholecalciferol (VITAMIN D-1000 MAX ST) 1000 units tablet, Take 5,000 Units by mouth daily. , Disp: , Rfl:  .  Menthol, Topical Analgesic, (BIOFREEZE EX), Apply 1 application topically as needed (Pain)., Disp: , Rfl:  .  ocrelizumab 600 mg in sodium chloride 0.9 % 500 mL, Inject 600 mg into the vein every 6 (six) months., Disp: , Rfl:  .  sertraline (ZOLOFT) 100 MG tablet, Take 1 tablet (100 mg total) by mouth daily., Disp: 90 tablet, Rfl: 3 .  sertraline (ZOLOFT) 50 MG tablet, Take 1 tablet (50 mg total) by mouth daily., Disp: 90 tablet, Rfl: 3 .  tolterodine (DETROL LA) 4 MG 24 hr capsule, Take 1 capsule (4 mg total) by mouth daily., Disp: 30 capsule, Rfl: 11 .  topiramate (TOPAMAX) 100 MG tablet, Take 1 tablet (100 mg total) by mouth at bedtime., Disp: 30 tablet, Rfl: 11 .  traZODone (DESYREL) 50 MG tablet, Take 1 tablet (50 mg total) by mouth at bedtime., Disp: 90 tablet, Rfl: 3 .  triamcinolone (NASACORT ALLERGY 24HR) 55 MCG/ACT AERO nasal inhaler, Place 1 spray into the nose as needed (Congestion)., Disp: , Rfl:  .  azithromycin (ZITHROMAX Z-PAK) 250 MG tablet, Take 2 po x 1 day, then 1 po qd x 4 days, Disp: 6 each, Rfl: 0  PAST MEDICAL HISTORY: Past Medical History:  Diagnosis Date  . Depression   . MS (multiple sclerosis) ([place])   . Multiple sclerosis ([place])     PAST SURGICAL HISTORY: History reviewed. No pertinent surgical history.  FAMILY HISTORY: Family History  Problem Relation Age of Onset  . Multiple myeloma Mother   . Multiple sclerosis Mother   . Obesity Sister   . Anxiety disorder Daughter     SOCIAL  HISTORY:  Social History   Socioeconomic History  . Marital status: Divorced    Spouse name: Not on file  . Number of children: Not on file  . Years of education: Not on file  . Highest education level: Not on file  Occupational History  . Not on file  Social Needs  . Financial resource strain: Not on file  . Food insecurity:    Worry: Not on file    Inability: Not on file  . Transportation needs:    Medical: Not on file    Non-medical: Not on file  Tobacco Use  . Smoking status: Never Smoker  . Smokeless tobacco: Never Used  Substance and Sexual Activity  . Alcohol use: Yes    Alcohol/week: 0.0 standard drinks    Comment: occasional/fim  . Drug use: No  . Sexual activity: Yes    Birth control/protection: I.U.D.  Lifestyle  . Physical activity:    Days per week: Not on file    Minutes per session: Not on file  .  Stress: Not on file  Relationships  . Social connections:    Talks on phone: Not on file    Gets together: Not on file    Attends religious service: Not on file    Active member of club or organization: Not on file    Attends meetings of clubs or organizations: Not on file    Relationship status: Not on file  . Intimate partner violence:    Fear of current or ex partner: Not on file    Emotionally abused: Not on file    Physically abused: Not on file    Forced sexual activity: Not on file  Other Topics Concern  . Not on file  Social History Narrative  . Not on file     PHYSICAL EXAM  Vitals:   02/18/18 0834  BP: 124/76  Pulse: (!) 123  Weight: 181 lb 8 oz (82.3 kg)  Height: 5' 2"  (1.575 m)    Body mass index is 33.2 kg/m.   General: The patient is well-developed and well-nourished and in no acute distress    Musculoskeletal:    The neck is nontender with a good range of motion.  Neurologic Exam  Mental status: The patient is alert and oriented x 3 at the time of the examination. The patient has apparent normal recent and remote  memory, with an apparently normal attention span and concentration ability.   Speech is normal.  Cranial nerves: Extraocular movements are full.  Facial strength and sensation is normal.  Trapezius strength is normal.  The tongue is midline, and the patient has symmetric elevation of the soft palate. No obvious hearing deficits are noted.  Motor:  Muscle bulk is normal.   Tone is mildly increased in legs, bilaterally fairly equally.  Strength is  5 / 5 in all 4 extremities except 4+/5 in the feet.   Sensory: Sensory testing shows a hypersensitive response to touch/temp/vibration in the right leg.   Arms are symmetric.    Coordination: Cerebellar testing shows good finger-nose-finger but reduced heel-to-shin bilaterally.  Gait and station: Station is normal.  She has a mild right foot drop.  The tandem walk is wide.. The Romberg is negative..   Reflexes: Deep tendon reflexes are symmetric and increased in her legs. She has crossed adductor responses at the knees.       DIAGNOSTIC DATA (LABS, IMAGING, TESTING) - I reviewed patient records, labs, notes, testing and imaging myself where available.  Lab Results  Component Value Date   WBC 9.7 09/08/2017   HGB 14.5 09/08/2017   HCT 43.2 09/08/2017   MCV 84 09/08/2017   PLT 419 09/08/2017      Component Value Date/Time   NA 142 09/08/2017 1533   NA 141 07/28/2013 0410   K 3.8 09/08/2017 1533   K 3.7 07/28/2013 0410   CL 103 09/08/2017 1533   CL 103 07/28/2013 0410   CO2 22 09/08/2017 1533   CO2 29 07/28/2013 0410   GLUCOSE 92 09/08/2017 1533   GLUCOSE 98 09/08/2016 1855   GLUCOSE 121 (H) 07/28/2013 0410   BUN 15 09/08/2017 1533   BUN 16 07/28/2013 0410   CREATININE 0.76 09/08/2017 1533   CREATININE 0.66 07/28/2013 0410   CALCIUM 9.6 09/08/2017 1533   CALCIUM 8.6 07/28/2013 0410   PROT 7.0 09/08/2017 1533   PROT 6.7 07/25/2013 0410   ALBUMIN 4.9 09/08/2017 1533   ALBUMIN 3.3 (L) 07/25/2013 0410   AST 19 09/08/2017 1533    AST  20 07/25/2013 0410   ALT 14 09/08/2017 1533   ALT 18 07/25/2013 0410   ALKPHOS 91 09/08/2017 1533   ALKPHOS 49 07/25/2013 0410   BILITOT 0.3 09/08/2017 1533   BILITOT 0.4 07/25/2013 0410   GFRNONAA 99 09/08/2017 1533   GFRNONAA >60 07/28/2013 0410   GFRAA 114 09/08/2017 1533   GFRAA >60 07/28/2013 0410       ASSESSMENT AND PLAN  Relapsing remitting multiple sclerosis (HCC) - Plan: CBC with Differential/Platelet, IgG, IgA, IgM, Hepatic function panel  High risk medication use - Plan: CBC with Differential/Platelet, IgG, IgA, IgM, Hepatic function panel  Influenza due to influenza virus, type B  Attention deficit disorder, unspecified hyperactivity presence  Depression with anxiety  Migraine without aura and without status migrainosus, not intractable    1.   She will continue Ocrevus.  Check CBC with Diff, LFT, IgG/IgM/IgA.    2.   Continue the Adderall 30 mg XR in the morning with 10 mg as needed later in the day for her reduced attention.    Continue Topamax for migraines 3.    She has had much more fatigue.  IV Solumedrol 1000 mg x 1 days 4.   Return to clinic in 5-6 months or sooner if there are new or worsening neurologic symptoms.    A. Felecia Shelling, MD, PhD 1/69/6789, 3:81 AM Certified in Neurology, Clinical Neurophysiology, Sleep Medicine, Pain Medicine and Neuroimaging  [facility] [address], [place], [place] [address] [phone] olll

## 2018-02-19 ENCOUNTER — Encounter: Payer: Self-pay | Admitting: *Deleted

## 2018-02-19 LAB — CBC WITH DIFFERENTIAL/PLATELET
Basophils Absolute: 0.1 10*3/uL (ref 0.0–0.2)
Basos: 1 %
EOS (ABSOLUTE): 0.2 10*3/uL (ref 0.0–0.4)
Eos: 2 %
Hematocrit: 45.5 % (ref 34.0–46.6)
Hemoglobin: 14.7 g/dL (ref 11.1–15.9)
Immature Grans (Abs): 0 10*3/uL (ref 0.0–0.1)
Immature Granulocytes: 0 %
LYMPHS ABS: 0.8 10*3/uL (ref 0.7–3.1)
Lymphs: 8 %
MCH: 28.1 pg (ref 26.6–33.0)
MCHC: 32.3 g/dL (ref 31.5–35.7)
MCV: 87 fL (ref 79–97)
Monocytes Absolute: 0.9 10*3/uL (ref 0.1–0.9)
Monocytes: 10 %
Neutrophils Absolute: 7.5 10*3/uL — ABNORMAL HIGH (ref 1.4–7.0)
Neutrophils: 79 %
Platelets: 387 10*3/uL (ref 150–450)
RBC: 5.24 x10E6/uL (ref 3.77–5.28)
RDW: 13.1 % (ref 11.7–15.4)
WBC: 9.5 10*3/uL (ref 3.4–10.8)

## 2018-02-19 LAB — IGG, IGA, IGM
IgA/Immunoglobulin A, Serum: 123 mg/dL (ref 87–352)
IgG (Immunoglobin G), Serum: 1010 mg/dL (ref 700–1600)
IgM (Immunoglobulin M), Srm: 144 mg/dL (ref 26–217)

## 2018-02-19 LAB — HEPATIC FUNCTION PANEL
ALT: 11 IU/L (ref 0–32)
AST: 11 IU/L (ref 0–40)
Albumin: 4.5 g/dL (ref 3.5–5.5)
Alkaline Phosphatase: 106 IU/L (ref 39–117)
Bilirubin Total: 0.3 mg/dL (ref 0.0–1.2)
Bilirubin, Direct: 0.1 mg/dL (ref 0.00–0.40)
Total Protein: 7 g/dL (ref 6.0–8.5)

## 2018-03-11 ENCOUNTER — Other Ambulatory Visit: Payer: Self-pay | Admitting: Neurology

## 2018-03-12 MED ORDER — AMPHETAMINE-DEXTROAMPHET ER 30 MG PO CP24: 30.0000 mg | ORAL_CAPSULE | Freq: Every day | ORAL | 0 refills | Status: DC

## 2018-03-25 ENCOUNTER — Other Ambulatory Visit: Payer: Self-pay | Admitting: *Deleted

## 2018-03-25 MED ORDER — ADDERALL XR 30 MG PO CP24: ORAL_CAPSULE | ORAL | 0 refills | Status: DC

## 2018-04-03 ENCOUNTER — Ambulatory Visit (INDEPENDENT_AMBULATORY_CARE_PROVIDER_SITE_OTHER): Payer: PRIVATE HEALTH INSURANCE | Admitting: Nurse Practitioner

## 2018-04-03 ENCOUNTER — Other Ambulatory Visit: Payer: Self-pay

## 2018-04-03 ENCOUNTER — Encounter: Payer: Self-pay | Admitting: Nurse Practitioner

## 2018-04-03 VITALS — BP 122/85 | HR 92 | Temp 97.9°F | Ht 62.0 in | Wt 186.2 lb

## 2018-04-03 DIAGNOSIS — G35 Multiple sclerosis: Secondary | ICD-10-CM

## 2018-04-03 DIAGNOSIS — F418 Other specified anxiety disorders: Secondary | ICD-10-CM

## 2018-04-03 MED ORDER — SERTRALINE HCL 100 MG PO TABS: 200.0000 mg | ORAL_TABLET | Freq: Every day | ORAL | 3 refills | Status: DC

## 2018-04-03 NOTE — Assessment & Plan Note (Signed)
Chronic, ongoing.  Continue to collaborate with neurology.

## 2018-04-03 NOTE — Assessment & Plan Note (Signed)
Exacerbated at this time, feeling very overwhelmed.  Increase Sertraline to 200 MG daily and continue current Trazodone dose as is sleeping good.  Urgent consult to psychology, as would benefit from talk therapy and tools.  Patient agrees with plan of care at this time.  Denies SI/HI.  F/U in 4 weeks.

## 2018-04-03 NOTE — Patient Instructions (Signed)

## 2018-04-03 NOTE — Progress Notes (Signed)
BP 122/85 (BP Location: Left Arm, Patient Position: Sitting, Cuff Size: Normal)   Pulse 92   Temp 97.9 F (36.6 C) (Oral)   Ht 5\' 2"  (1.575 m)   Wt 186 lb 3.2 oz (84.5 kg)   SpO2 96%   BMI 34.06 kg/m    Subjective:    Patient ID: [name], female    DOB: 18-Mar-1980, 38 y.o.   MRN: [id number]  HPI: [name] is a 38 y.o. female  Chief Complaint  Patient presents with  . Fatigue  . Depression  . Stress   DEPRESSION Ms. [name] has MS and is currently feeling very overwhelmed.  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  Currently having difficulty with memory on occasion due to feeling overwhelmed and her MS with increased fatigue.  She sees neurology for her MS and was last seen 02/18/2018. Mood status: exacerbated Satisfied with current treatment?: yes Symptom severity: moderate  Duration of current treatment : chronic Side effects: no Medication compliance: good compliance Psychotherapy/counseling: none Depressed mood: yes Anxious mood: yes Anhedonia: no Significant weight loss or gain: no Insomnia: none Fatigue: yes Feelings of worthlessness or guilt: no Impaired concentration/indecisiveness: yes Suicidal ideations: no Hopelessness: no Crying spells: yes Depression screen [facility] 2/9 04/03/2018 12/02/2017 11/18/2017  Decreased Interest 3 1 0  Down, Depressed, Hopeless 2 1 0  PHQ - 2 Score 5 2 0  Altered sleeping 1 1 0  Tired, decreased energy 3 3 3   Change in appetite 3 2 1   Feeling bad or failure about yourself  3 0 1  Trouble concentrating 3 1 2   Moving slowly or fidgety/restless 0 0 1  Suicidal thoughts 0 0 0  PHQ-9 Score 18 9 8   Difficult doing work/chores Somewhat difficult Very difficult Not difficult at all    Relevant past medical, surgical, family and social history reviewed and updated as indicated. Interim medical history since our last visit reviewed. Allergies and  medications reviewed and updated.  Review of Systems  Constitutional: Positive for fatigue. Negative for activity change, appetite change, diaphoresis and fever.  Respiratory: Negative for cough, chest tightness and shortness of breath.   Cardiovascular: Negative for chest pain, palpitations and leg swelling.  Gastrointestinal: Negative for abdominal distention, abdominal pain, constipation, diarrhea, nausea and vomiting.  Endocrine: Negative for cold intolerance, heat intolerance, polydipsia, polyphagia and polyuria.  Neurological: Negative for dizziness, syncope, weakness, light-headedness, numbness and headaches.  Psychiatric/Behavioral: Positive for decreased concentration. Negative for confusion, self-injury, sleep disturbance and suicidal ideas. The patient is nervous/anxious.     Per HPI unless specifically indicated above     Objective:    BP 122/85 (BP Location: Left Arm, Patient Position: Sitting, Cuff Size: Normal)   Pulse 92   Temp 97.9 F (36.6 C) (Oral)   Ht 5\' 2"  (1.575 m)   Wt 186 lb 3.2 oz (84.5 kg)   SpO2 96%   BMI 34.06 kg/m   Wt Readings from Last 3 Encounters:  04/03/18 186 lb 3.2 oz (84.5 kg)  02/18/18 181 lb 8 oz (82.3 kg)  01/23/18 178 lb (80.7 kg)    Physical Exam Vitals signs and nursing note reviewed.  Constitutional:      Appearance: She is well-developed.  HENT:     Head: Normocephalic.  Eyes:     General:        Right eye: No discharge.  Left eye: No discharge.     Conjunctiva/sclera: Conjunctivae normal.     Pupils: Pupils are equal, round, and reactive to light.  Neck:     Musculoskeletal: Normal range of motion and neck supple.     Thyroid: No thyromegaly.     Vascular: No carotid bruit or JVD.  Cardiovascular:     Rate and Rhythm: Normal rate and regular rhythm.     Heart sounds: Normal heart sounds. No murmur. No gallop.   Pulmonary:     Effort: Pulmonary effort is normal.     Breath sounds: Normal breath sounds.    Abdominal:     General: Bowel sounds are normal.     Palpations: Abdomen is soft.  Musculoskeletal:     Right lower leg: No edema.     Left lower leg: No edema.  Lymphadenopathy:     Cervical: No cervical adenopathy.  Skin:    General: Skin is warm and dry.  Neurological:     Mental Status: She is alert and oriented to person, place, and time.  Psychiatric:        Attention and Perception: Attention normal.        Mood and Affect: Affect is tearful.        Speech: Speech normal.        Behavior: Behavior normal.        Thought Content: Thought content normal.        Judgment: Judgment normal.     Comments: Very tearful while discussing her current stressors      Results for orders placed or performed in visit on 02/18/18  CBC with Differential/Platelet  Result Value Ref Range   WBC 9.5 3.4 - 10.8 x10E3/uL   RBC 5.24 3.77 - 5.28 x10E6/uL   Hemoglobin 14.7 11.1 - 15.9 g/dL   Hematocrit 65.7 84.6 - 46.6 %   MCV 87 79 - 97 fL   MCH 28.1 26.6 - 33.0 pg   MCHC 32.3 31.5 - 35.7 g/dL   RDW 96.2 95.2 - 84.1 %   Platelets 387 150 - 450 x10E3/uL   Neutrophils 79 Not Estab. %   Lymphs 8 Not Estab. %   Monocytes 10 Not Estab. %   Eos 2 Not Estab. %   Basos 1 Not Estab. %   Neutrophils Absolute 7.5 (H) 1.4 - 7.0 x10E3/uL   Lymphocytes Absolute 0.8 0.7 - 3.1 x10E3/uL   Monocytes Absolute 0.9 0.1 - 0.9 x10E3/uL   EOS (ABSOLUTE) 0.2 0.0 - 0.4 x10E3/uL   Basophils Absolute 0.1 0.0 - 0.2 x10E3/uL   Immature Granulocytes 0 Not Estab. %   Immature Grans (Abs) 0.0 0.0 - 0.1 x10E3/uL  IgG, IgA, IgM  Result Value Ref Range   IgG (Immunoglobin G), Serum 1,010 700 - 1,600 mg/dL   IgA/Immunoglobulin A, Serum 123 87 - 352 mg/dL   IgM (Immunoglobulin M), Srm 144 26 - 217 mg/dL  Hepatic function panel  Result Value Ref Range   Total Protein 7.0 6.0 - 8.5 g/dL   Albumin 4.5 3.5 - 5.5 g/dL   Bilirubin Total 0.3 0.0 - 1.2 mg/dL   Bilirubin, Direct 3.24 0.00 - 0.40 mg/dL   Alkaline  Phosphatase 106 39 - 117 IU/L   AST 11 0 - 40 IU/L   ALT 11 0 - 32 IU/L      Assessment & Plan:   Problem List Items Addressed This Visit      Nervous and Auditory   Relapsing remitting multiple sclerosis (  HCC) - Primary    Chronic, ongoing.  Continue to collaborate with neurology.        Other   Depression with anxiety    Exacerbated at this time, feeling very overwhelmed.  Increase Sertraline to 200 MG daily and continue current Trazodone dose as is sleeping good.  Urgent consult to psychology, as would benefit from talk therapy and tools.  Patient agrees with plan of care at this time.  Denies SI/HI.  F/U in 4 weeks.      Relevant Medications   sertraline (ZOLOFT) 100 MG tablet   Other Relevant Orders   Ambulatory referral to Psychology       Follow up plan: Return in about 4 weeks (around 05/01/2018) for Depression.

## 2018-04-16 ENCOUNTER — Other Ambulatory Visit: Payer: Self-pay | Admitting: *Deleted

## 2018-04-16 MED ORDER — AMPHETAMINE-DEXTROAMPHET ER 30 MG PO CP24: 30.0000 mg | ORAL_CAPSULE | Freq: Every day | ORAL | 0 refills | Status: DC

## 2018-04-22 ENCOUNTER — Telehealth: Payer: Self-pay | Admitting: Family

## 2018-04-22 DIAGNOSIS — R05 Cough: Secondary | ICD-10-CM

## 2018-04-22 DIAGNOSIS — R059 Cough, unspecified: Secondary | ICD-10-CM

## 2018-04-22 DIAGNOSIS — J069 Acute upper respiratory infection, unspecified: Secondary | ICD-10-CM

## 2018-04-22 MED ORDER — BENZONATATE 100 MG PO CAPS: 100.0000 mg | ORAL_CAPSULE | Freq: Three times a day (TID) | ORAL | 0 refills | Status: DC | PRN

## 2018-04-22 MED ORDER — FLUTICASONE PROPIONATE 50 MCG/ACT NA SUSP: 2.0000 | Freq: Every day | NASAL | 6 refills | Status: DC

## 2018-04-22 NOTE — Progress Notes (Signed)
We are sorry you are not feeling well.  Here is how we plan to help!  Based on what you have shared with me, it looks like you may have a viral upper respiratory infection.  Upper respiratory infections are caused by a large number of viruses; however, rhinovirus is the most common cause.   Approximately 5 minutes was spent documenting and reviewing patient's chart.   Symptoms vary from person to person, with common symptoms including sore throat, cough, and fatigue or lack of energy.  A low-grade fever of up to 100.4 may present, but is often uncommon.  Symptoms vary however, and are closely related to a person's age or underlying illnesses.  The most common symptoms associated with an upper respiratory infection are nasal discharge or congestion, cough, sneezing, headache and pressure in the ears and face.  These symptoms usually persist for about 3 to 10 days, but can last up to 2 weeks.  It is important to know that upper respiratory infections do not cause serious illness or complications in most cases.    Upper respiratory infections can be transmitted from person to person, with the most common method of transmission being a person's hands.  The virus is able to live on the skin and can infect other persons for up to 2 hours after direct contact.  Also, these can be transmitted when someone coughs or sneezes; thus, it is important to cover the mouth to reduce this risk.  To keep the spread of the illness at bay, good hand hygiene is very important.  This is an infection that is most likely caused by a virus. There are no specific treatments other than to help you with the symptoms until the infection runs its course.  We are sorry you are not feeling well.  Here is how we plan to help!   For nasal congestion, you may use an oral decongestants such as Mucinex D or if you have glaucoma or high blood pressure use plain Mucinex.  Saline nasal spray or nasal drops can help and can safely be used as  often as needed for congestion.  For your congestion, I have prescribed Fluticasone nasal spray one spray in each nostril twice a day  If you do not have a history of heart disease, hypertension, diabetes or thyroid disease, prostate/bladder issues or glaucoma, you may also use Sudafed to treat nasal congestion.  It is highly recommended that you consult with a pharmacist or your primary care physician to ensure this medication is safe for you to take.     If you have a cough, you may use cough suppressants such as Delsym and Robitussin.  If you have glaucoma or high blood pressure, you can also use Coricidin HBP.   For cough I have prescribed for you A prescription cough medication called Tessalon Perles 100 mg. You may take 1-2 capsules every 8 hours as needed for cough.  If you have a sore or scratchy throat, use a saltwater gargle-  to  teaspoon of salt dissolved in a 4-ounce to 8-ounce glass of warm water.  Gargle the solution for approximately 15-30 seconds and then spit.  It is important not to swallow the solution.  You can also use throat lozenges/cough drops and Chloraseptic spray to help with throat pain or discomfort.  Warm or cold liquids can also be helpful in relieving throat pain.  For headache, pain or general discomfort, you can use Ibuprofen or Tylenol as directed.   Some authorities   believe that zinc sprays or the use of Echinacea may shorten the course of your symptoms.   HOME CARE . Only take medications as instructed by your medical team. . Be sure to drink plenty of fluids. Water is fine as well as fruit juices, sodas and electrolyte beverages. You may want to stay away from caffeine or alcohol. If you are nauseated, try taking small sips of liquids. How do you know if you are getting enough fluid? Your urine should be a pale yellow or almost colorless. . Get rest. . Taking a steamy shower or using a humidifier may help nasal congestion and ease sore throat pain. You can  place a towel over your head and breathe in the steam from hot water coming from a faucet. . Using a saline nasal spray works much the same way. . Cough drops, hard candies and sore throat lozenges may ease your cough. . Avoid close contacts especially the very young and the elderly . Cover your mouth if you cough or sneeze . Always remember to wash your hands.   GET HELP RIGHT AWAY IF: . You develop worsening fever. . If your symptoms do not improve within 10 days . You develop yellow or green discharge from your nose over 3 days. . You have coughing fits . You develop a severe head ache or visual changes. . You develop shortness of breath, difficulty breathing or start having chest pain . Your symptoms persist after you have completed your treatment plan  MAKE SURE YOU   Understand these instructions.  Will watch your condition.  Will get help right away if you are not doing well or get worse.  Your e-visit answers were reviewed by a board certified advanced clinical practitioner to complete your personal care plan. Depending upon the condition, your plan could have included both over the counter or prescription medications. Please review your pharmacy choice. If there is a problem, you may call our nursing hot line at and have the prescription routed to another pharmacy. Your safety is important to us. If you have drug allergies check your prescription carefully.   You can use MyChart to ask questions about today's visit, request a non-urgent call back, or ask for a work or school excuse for 24 hours related to this e-Visit. If it has been greater than 24 hours you will need to follow up with your provider, or enter a new e-Visit to address those concerns. You will get an e-mail in the next two days asking about your experience.  I hope that your e-visit has been valuable and will speed your recovery. Thank you for using e-visits.      

## 2018-05-01 ENCOUNTER — Ambulatory Visit: Payer: PRIVATE HEALTH INSURANCE | Admitting: Nurse Practitioner

## 2018-05-17 ENCOUNTER — Other Ambulatory Visit: Payer: Self-pay

## 2018-05-18 MED ORDER — AMPHETAMINE-DEXTROAMPHET ER 30 MG PO CP24: 30.0000 mg | ORAL_CAPSULE | Freq: Every day | ORAL | 0 refills | Status: DC

## 2018-05-18 NOTE — Telephone Encounter (Signed)
[facility] Verified LR: 04/16/2018 Qty: 30 Pending appointment: 08/19/2018

## 2018-05-21 ENCOUNTER — Encounter: Payer: Self-pay | Admitting: Psychiatry

## 2018-05-21 ENCOUNTER — Other Ambulatory Visit: Payer: Self-pay

## 2018-05-21 ENCOUNTER — Other Ambulatory Visit: Payer: Self-pay | Admitting: Nurse Practitioner

## 2018-05-21 ENCOUNTER — Ambulatory Visit (INDEPENDENT_AMBULATORY_CARE_PROVIDER_SITE_OTHER): Payer: PRIVATE HEALTH INSURANCE | Admitting: Psychiatry

## 2018-05-21 DIAGNOSIS — G4700 Insomnia, unspecified: Secondary | ICD-10-CM

## 2018-05-21 DIAGNOSIS — F411 Generalized anxiety disorder: Secondary | ICD-10-CM | POA: Diagnosis not present

## 2018-05-21 DIAGNOSIS — F0632 Mood disorder due to known physiological condition with major depressive-like episode: Secondary | ICD-10-CM

## 2018-05-21 DIAGNOSIS — F418 Other specified anxiety disorders: Secondary | ICD-10-CM

## 2018-05-21 MED ORDER — TRAZODONE HCL 50 MG PO TABS: 75.0000 mg | ORAL_TABLET | Freq: Every day | ORAL | 0 refills | Status: DC

## 2018-05-21 MED ORDER — BUPROPION HCL 75 MG PO TABS: 75.0000 mg | ORAL_TABLET | Freq: Every day | ORAL | 0 refills | Status: DC

## 2018-05-21 NOTE — Progress Notes (Signed)
TC on  05-21-18 @ 09:08 pt medical and surgical hx was reviewed with updates.  Pt allergies was reviewed with no changes. Pt medications and pharmacy were reviewed and updated. No vitals taken because this is a phone consult.

## 2018-05-21 NOTE — Progress Notes (Signed)
Referral for CBT with therapist in [place] per Dr. Elna Breslow request

## 2018-05-21 NOTE — Progress Notes (Signed)
Psychiatric Initial Adult Assessment   Patient Identification: [name] MRN:  [id number] Date of Evaluation:  05/21/2018 Referral Source: Marnee Guarneri NP Chief Complaint:   Chief Complaint    Establish Care     Visit Diagnosis:    ICD-10-CM   1. 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 GAD (generalized anxiety disorder) F41.1   3. Insomnia, unspecified type G47.00 traZODone (DESYREL) 50 MG tablet    History of Present Illness:  [name] is a 38 year old Caucasian female who has a history of optic neuritis, multiple sclerosis, migraine headaches, was evaluated by telemedicine today.  Patient reports she has been struggling with depressive symptoms since the past 2 years or so.  She reports she was diagnosed with multiple sclerosis 3 years ago.  She reports she has tried at least 3 different medications for multiple sclerosis and currently taking the strongest available.  So far that has been working.  She reports she struggles with fatigue a lot.  She reports she used to go to the gym on a regular basis prior to all this and was very active.  She however reports because of the fatigue now she has to preserve her energy to just get through the day.  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  She reports she is busy all day and also has to go come back and take care of her children who are between the age of 62-15.  She reports she does have help from her current husband.  Even though they got married they are currently living separate.  She reports for the children she wanted to move it slow.  They are working on combining household gradually.  She also has some help from her ex-husband, the children's father.  She reports her parents are also supportive.  She however reports she struggles with crying spells sadness, sleep problems fatigue ,inability to focus on a regular  basis.  She reports she was started on Zoloft almost 2 years ago.  Her dosage was increased to 200 mg a month ago.  It does help to some extent.  She denies any significant side effects to the Zoloft.  She reports that she has been struggling with sleep since the past few days.  She has been waking up a lot at night.  She is on trazodone which has been helpful.  Discussed increasing her trazodone and she agrees with plan.  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  She reports she worries about everything to the extreme.  She reports she is nervous and on edge a lot and also has trouble relaxing.  She also reports some panic attack-like symptoms when she has chest pain and feels very tense and has difficulty breathing.  She reports she has been working on some relaxation techniques like deep breathing counting backward and so on.  Patient denies any suicidality, homicidality or perceptual disturbances.  Patient denies any hypomanic or manic symptoms.  Patient denies any substance abuse problems.  Patient reports she is currently on Adderall for her fatigue and attention, prescribed by her neurologist.  She reports it helps at this dosage.  I have reviewed medical records in [facility] per Dr. Arlice Colt with [facility] dated 02/18/2018-' Patient with MS, ADD, depression, migraine-she will continue Ocrevus, Adderall XR 30 mg in the a.m. and 10 mg as needed, Topamax, IV Solu-Medrol .  Patient  advised to return to clinic in 5 to 6 months. '  Associated Signs/Symptoms: Depression Symptoms:  depressed mood, insomnia, fatigue, difficulty concentrating, anxiety, loss of energy/fatigue, (Hypo) Manic Symptoms:  Denies  Anxiety Symptoms:  Excessive Worry, Panic Symptoms, Psychotic Symptoms:  Denies PTSD Symptoms: Negative  Past Psychiatric History: Patient reports she was started on Zoloft for depression by her primary medical doctor almost 2 years ago.  She denies any suicide  attempts.  She denies any inpatient mental health admissions.  Previous Psychotropic Medications: Yes Zoloft, Adderall, trazodone  Substance Abuse History in the last 12 months:  No.  Consequences of Substance Abuse: Negative  Past Medical History:  Past Medical History:  Diagnosis Date  . Depression   . MS (multiple sclerosis) ([place])   . Multiple sclerosis ([place])    History reviewed. No pertinent surgical history.  Family Psychiatric History: Patient reports her mother has depression, daughter-anxiety disorder.  Family History:  Family History  Problem Relation Age of Onset  . Multiple myeloma Mother   . Multiple sclerosis Mother   . Obesity Sister   . Anxiety disorder Daughter   . Multiple sclerosis Maternal Aunt     Social History:   Social History   Socioeconomic History  . Marital status: Married    Spouse name: steven ambrose  . Number of children: 3  . Years of education: Not on file  . Highest education level: Bachelor's degree (e.g., BA, AB, BS)  Occupational History  . Not on file  Social Needs  . Financial resource strain: Not hard at all  . Food insecurity:    Worry: Never true    Inability: Never true  . Transportation needs:    Medical: No    Non-medical: No  Tobacco Use  . Smoking status: Never Smoker  . Smokeless tobacco: Never Used  Substance and Sexual Activity  . Alcohol use: Not Currently    Alcohol/week: 0.0 standard drinks    Comment: occasional/fim  . Drug use: No  . Sexual activity: Yes    Birth control/protection: I.U.D.  Lifestyle  . Physical activity:    Days per week: 0 days    Minutes per session: 0 min  . Stress: Only a little  Relationships  . Social connections:    Talks on phone: Not on file    Gets together: Not on file    Attends religious service: More than 4 times per year    Active member of club or organization: No    Attends meetings of clubs or organizations: Never    Relationship status: Married  Other  Topics Concern  . Not on file  Social History Narrative  . Not on file    Additional Social History: Patient works as a Civil engineer, contracting.  She lives in [place].  Patient is married twice, divorced once.  She recently got married to her current husband.  She has 3 children- Shanon Brow 26 years old, Viviano Simas, a daughter-89 years old, and Percell Miller- 40 years old from her previous marriage.  Her current husband Annie Main also has 3 children and because of the children they are currently living separate but working on combining household at some point.  Her ex-husband, the father of her children helps to some extent.  She has good support system from her parents.  Allergies:  No Known Allergies  Metabolic Disorder Labs: No results found for: HGBA1C, MPG No results found for: PROLACTIN Lab Results  Component Value Date   CHOL 147 12/02/2017   TRIG  136 12/02/2017   HDL 57 12/02/2017   CHOLHDL 2.6 12/02/2017   LDLCALC 63 12/02/2017   Lab Results  Component Value Date   TSH 1.240 09/08/2017    Therapeutic Level Labs: No results found for: LITHIUM No results found for: CBMZ No results found for: VALPROATE  Current Medications: Current Outpatient Medications  Medication Sig Dispense Refill  . amphetamine-dextroamphetamine (ADDERALL XR) 30 MG 24 hr capsule Take 1 capsule (30 mg total) by mouth daily. 30 capsule 0  . amphetamine-dextroamphetamine (ADDERALL) 10 MG tablet Take one or two po in the aftrernoon 60 tablet 0  . baclofen (LIORESAL) 10 MG tablet TAKE 1 TABLET BY MOUTH THREE TIMES A DAY AS NEEDED FOR MUSCLE SPASMS 270 tablet 1  . betamethasone dipropionate (DIPROLENE) 0.05 % cream Apply topically 2 (two) times daily. 30 g 0  . Cholecalciferol (VITAMIN D-1000 MAX ST) 1000 units tablet Take 5,000 Units by mouth daily.     Marland Kitchen ocrelizumab 600 mg in sodium chloride 0.9 % 500 mL Inject 600 mg into the vein every 6 (six) months.    . sertraline (ZOLOFT) 100 MG tablet Take 2 tablets (200 mg total) by  mouth daily. 60 tablet 3  . topiramate (TOPAMAX) 100 MG tablet Take 1 tablet (100 mg total) by mouth at bedtime. 30 tablet 11  . traZODone (DESYREL) 50 MG tablet Take 1.5 tablets (75 mg total) by mouth at bedtime. 135 tablet 0  . triamcinolone (NASACORT ALLERGY 24HR) 55 MCG/ACT AERO nasal inhaler Place 1 spray into the nose as needed (Congestion).    Marland Kitchen buPROPion (WELLBUTRIN) 75 MG tablet Take 1 tablet (75 mg total) by mouth daily with breakfast. 90 tablet 0   No current facility-administered medications for this visit.     Musculoskeletal: Strength & Muscle Tone: UTA Gait & Station: UTA Patient leans: N/A  Psychiatric Specialty Exam: Review of Systems  Constitutional: Positive for malaise/fatigue.  Psychiatric/Behavioral: Positive for depression. The patient is nervous/anxious and has insomnia.   All other systems reviewed and are negative.   There were no vitals taken for this visit.There is no height or weight on file to calculate BMI.  General Appearance: Casual  Eye Contact:  Fair  Speech:  Clear and Coherent  Volume:  Normal  Mood:  Anxious and Depressed  Affect:  Congruent  Thought Process:  Goal Directed and Descriptions of Associations: Intact  Orientation:  Full (Time, Place, and Person)  Thought Content:  Logical  Suicidal Thoughts:  No  Homicidal Thoughts:  No  Memory:  Immediate;   Fair Recent;   Fair Remote;   Fair  Judgement:  Fair  Insight:  Fair  Psychomotor Activity:  Normal  Concentration:  Concentration: Fair and Attention Span: Fair  Recall:  [employer] of Knowledge:Fair  Language: Fair  Akathisia:  No  Handed:  Right  AIMS (if indicated): Denies tremors, rigidity,stiffness  Assets:  Communication Skills Desire for Improvement Social Support  ADL's:  Intact  Cognition: WNL  Sleep:  Poor   Screenings: GAD-7     Office Visit from 04/03/2018 in [facility] Visit from 12/02/2017 in [facility]  Total GAD-7 Score   16  2    PHQ2-9     Office Visit from 04/03/2018 in [facility] Visit from 12/02/2017 in [facility] Visit from 11/18/2017 in [facility]  PHQ-2 Total Score  5  2  0  PHQ-9 Total Score  18  9  8  Assessment and Plan:Dorthey is a 38 year old Caucasian female, married, employed, lives in [place], has a history of multiple sclerosis, depression, fatigue, attention and concentration problems was evaluated by telemedicine today.  Patient is biologically predisposed given her family history as well as her chronic multiple sclerosis diagnosis.  Patient also has psychosocial stressors of having a chronic medical problem.  Patient has good social support system.  Patient will benefit from continued medication changes as well as psychotherapy sessions.  Plan as noted below.  Plan Depressive disorder secondary to medical condition-MS-unstable Add Wellbutrin 75 mg p.o. daily in the morning Continue Zoloft 200 mg p.o. daily. Refer for CBT with therapist here in clinic.  For insomnia- unstable Increase trazodone to 75 mg p.o. nightly  For GAD-unstable Zoloft as prescribed. Refer for CBT.  I have reviewed TSH in [facility] dated 09/08/2017-1.240-within normal limits.  I have reviewed medical records in [facility] per neurologist-Dr. Felecia Shelling 10 02/18/2018 as summarized above.  Follow-up in clinic in 1 month or sooner if needed.  I have spent atleast 45 minutes non face to face with patient today. More than 50 % of the time was spent for psychoeducation and supportive psychotherapy and care coordination.  This note was generated in part or whole with voice recognition software. Voice recognition is usually quite accurate but there are transcription errors that can and very often do occur. I apologize for any typographical errors that were not detected and corrected.       Ursula Alert, MD 4/16/202011:45 AM

## 2018-05-25 ENCOUNTER — Other Ambulatory Visit: Payer: Self-pay | Admitting: Nurse Practitioner

## 2018-05-25 NOTE — Telephone Encounter (Signed)
Requested Prescriptions  Pending Prescriptions Disp Refills  . sertraline (ZOLOFT) 100 MG tablet [Pharmacy Med Name: SERTRALINE HCL 100 MG TABLET] 180 tablet 2    Sig: TAKE 2 TABLETS BY MOUTH EVERY DAY     Psychiatry:  Antidepressants - SSRI Passed - 05/25/2018 10:33 AM      Passed - Completed PHQ-2 or PHQ-9 in the last 360 days.      Passed - Valid encounter within last 6 months    Recent Outpatient Visits          1 month ago Relapsing remitting multiple sclerosis (HCC)   [facility] [place], [place] T, NP   4 months ago Influenza due to influenza virus, type B   [facility] [place], [place] T, NP   4 months ago Cough   [facility] Gabriel Cirri, NP   5 months ago Annual physical exam   [facility] [place], [name], NP   6 months ago Immunization due   [facility], [name], NP      Future Appointments            In 1 week [place], [name], NP [facility], [facility]

## 2018-06-03 ENCOUNTER — Encounter: Payer: Self-pay | Admitting: Family Medicine

## 2018-06-03 ENCOUNTER — Ambulatory Visit (INDEPENDENT_AMBULATORY_CARE_PROVIDER_SITE_OTHER): Payer: PRIVATE HEALTH INSURANCE | Admitting: Family Medicine

## 2018-06-03 ENCOUNTER — Other Ambulatory Visit: Payer: Self-pay

## 2018-06-03 ENCOUNTER — Ambulatory Visit: Payer: BLUE CROSS/BLUE SHIELD | Admitting: Nurse Practitioner

## 2018-06-03 DIAGNOSIS — F418 Other specified anxiety disorders: Secondary | ICD-10-CM | POA: Diagnosis not present

## 2018-06-03 DIAGNOSIS — G4700 Insomnia, unspecified: Secondary | ICD-10-CM

## 2018-06-03 DIAGNOSIS — G35 Multiple sclerosis: Secondary | ICD-10-CM | POA: Diagnosis not present

## 2018-06-03 NOTE — Progress Notes (Signed)
There were no vitals taken for this visit.   Subjective:    Patient ID: [name], female    DOB: 02/24/1980, 38 y.o.   MRN: [id number]  HPI: [name] is a 38 y.o. female  Chief Complaint  Patient presents with  . Depression    "Okay" Has seen Psychatrist.  . Multiple Sclerosis    . This visit was completed via WebEx due to the restrictions of the COVID-19 pandemic. All issues as above were discussed and addressed. Physical exam was done as above through visual confirmation on WebEx. If it was felt that the patient should be evaluated in the office, they were directed there. The patient verbally consented to this visit. . Location of the patient: home . Location of the provider: work . Those involved with this call:  . Provider: Roosvelt Maser, PA-C . CMA: Sheilah Mins, CMA . Front Desk/Registration: Harriet Pho  . Time spent on call: 25 minutes with patient face to face via video conference. More than 50% of this time was spent in counseling and coordination of care. 5 minutes total spent in review of patient's record and preparation of their chart. I verified patient identity using two factors (patient name and date of birth). Patient consents verbally to being seen via telemedicine visit today.   Here today for 6 month f/u chronic conditions.   Depression and anxiety - recently established with [facility], going well so far. Trazodone increased to 75 mg at bedtime which is helping quite a bit with sleep but sometimes causing some grogginess in the morning if she takes it too late. Was recently started on wellbutrin in addition to zoloft but has not yet started it. Plans to soon. Denies SI/HI.   Depression screen [facility] 2/9 06/03/2018 04/03/2018 12/02/2017  Decreased Interest 2 3 1   Down, Depressed, Hopeless 0 2 1  PHQ - 2 Score 2 5 2   Altered sleeping 0 1 1  Tired, decreased energy 3 3 3   Change in appetite 0 3 2  Feeling bad or failure about yourself  2 3 0  Trouble concentrating  1 3 1   Moving slowly or fidgety/restless 0 0 0  Suicidal thoughts 0 0 0  PHQ-9 Score 8 18 9   Difficult doing work/chores Not difficult at all Somewhat difficult Very difficult   Followed by Neurology for MS, no new issues with that. Very concerned about her immune status given the medications she's on so has been very cautiously quarantining during this COVID crisis.   Relevant past medical, surgical, family and social history reviewed and updated as indicated. Interim medical history since our last visit reviewed. Allergies and medications reviewed and updated.  Review of Systems  Per HPI unless specifically indicated above     Objective:    There were no vitals taken for this visit.  Wt Readings from Last 3 Encounters:  04/03/18 186 lb 3.2 oz (84.5 kg)  02/18/18 181 lb 8 oz (82.3 kg)  01/23/18 178 lb (80.7 kg)    Physical Exam Vitals signs and nursing note reviewed.  Constitutional:      General: She is not in acute distress.    Appearance: Normal appearance.  HENT:     Head: Atraumatic.     Right Ear: External ear normal.     Left Ear: External ear normal.     Nose: Nose normal. No congestion.     Mouth/Throat:     Mouth: Mucous membranes are moist.  Pharynx: Oropharynx is clear. No posterior oropharyngeal erythema.  Eyes:     Extraocular Movements: Extraocular movements intact.     Conjunctiva/sclera: Conjunctivae normal.  Neck:     Musculoskeletal: Normal range of motion.  Cardiovascular:     Comments: Unable to assess via virtual visit Pulmonary:     Effort: Pulmonary effort is normal. No respiratory distress.  Musculoskeletal: Normal range of motion.  Skin:    General: Skin is dry.     Findings: No erythema.  Neurological:     Mental Status: She is alert and oriented to person, place, and time.  Psychiatric:        Mood and Affect: Mood normal.        Thought Content: Thought content normal.        Judgment: Judgment normal.     Results for orders  placed or performed in visit on 02/18/18  CBC with Differential/Platelet  Result Value Ref Range   WBC 9.5 3.4 - 10.8 x10E3/uL   RBC 5.24 3.77 - 5.28 x10E6/uL   Hemoglobin 14.7 11.1 - 15.9 g/dL   Hematocrit 78.2 42.3 - 46.6 %   MCV 87 79 - 97 fL   MCH 28.1 26.6 - 33.0 pg   MCHC 32.3 31.5 - 35.7 g/dL   RDW 53.6 14.4 - 31.5 %   Platelets 387 150 - 450 x10E3/uL   Neutrophils 79 Not Estab. %   Lymphs 8 Not Estab. %   Monocytes 10 Not Estab. %   Eos 2 Not Estab. %   Basos 1 Not Estab. %   Neutrophils Absolute 7.5 (H) 1.4 - 7.0 x10E3/uL   Lymphocytes Absolute 0.8 0.7 - 3.1 x10E3/uL   Monocytes Absolute 0.9 0.1 - 0.9 x10E3/uL   EOS (ABSOLUTE) 0.2 0.0 - 0.4 x10E3/uL   Basophils Absolute 0.1 0.0 - 0.2 x10E3/uL   Immature Granulocytes 0 Not Estab. %   Immature Grans (Abs) 0.0 0.0 - 0.1 x10E3/uL  IgG, IgA, IgM  Result Value Ref Range   IgG (Immunoglobin G), Serum 1,010 700 - 1,600 mg/dL   IgA/Immunoglobulin A, Serum 123 87 - 352 mg/dL   IgM (Immunoglobulin M), Srm 144 26 - 217 mg/dL  Hepatic function panel  Result Value Ref Range   Total Protein 7.0 6.0 - 8.5 g/dL   Albumin 4.5 3.5 - 5.5 g/dL   Bilirubin Total 0.3 0.0 - 1.2 mg/dL   Bilirubin, Direct 4.00 0.00 - 0.40 mg/dL   Alkaline Phosphatase 106 39 - 117 IU/L   AST 11 0 - 40 IU/L   ALT 11 0 - 32 IU/L      Assessment & Plan:   Problem List Items Addressed This Visit      Nervous and Auditory   Relapsing remitting multiple sclerosis (HCC)    Followed by Neurology, stable, continue current regimen        Other   Depression with anxiety - Primary    Working with Psychiatry now, will start the welbutrin sometime in the next week most likely. Continue zoloft per Psychiatry instruction. F/u with Psychiatry as scheduled      Insomnia    Doing very well on increased dose of trazodone, continue current regimen as well as good sleep hygiene tactics          Follow up plan: Return in about 6 months (around 12/03/2018) for  CPE.

## 2018-06-08 NOTE — Assessment & Plan Note (Signed)
Doing very well on increased dose of trazodone, continue current regimen as well as good sleep hygiene tactics

## 2018-06-08 NOTE — Assessment & Plan Note (Signed)
Followed by Neurology, stable, continue current regimen

## 2018-06-08 NOTE — Assessment & Plan Note (Signed)
Working with Psychiatry now, will start the welbutrin sometime in the next week most likely. Continue zoloft per Psychiatry instruction. F/u with Psychiatry as scheduled

## 2018-06-10 ENCOUNTER — Encounter: Payer: Self-pay | Admitting: Family Medicine

## 2018-06-24 ENCOUNTER — Other Ambulatory Visit: Payer: Self-pay

## 2018-06-24 ENCOUNTER — Ambulatory Visit (INDEPENDENT_AMBULATORY_CARE_PROVIDER_SITE_OTHER): Payer: PRIVATE HEALTH INSURANCE | Admitting: Psychiatry

## 2018-06-24 ENCOUNTER — Encounter: Payer: Self-pay | Admitting: Psychiatry

## 2018-06-24 DIAGNOSIS — F0632 Mood disorder due to known physiological condition with major depressive-like episode: Secondary | ICD-10-CM | POA: Diagnosis not present

## 2018-06-24 DIAGNOSIS — F411 Generalized anxiety disorder: Secondary | ICD-10-CM

## 2018-06-24 DIAGNOSIS — G4700 Insomnia, unspecified: Secondary | ICD-10-CM | POA: Diagnosis not present

## 2018-06-24 MED ORDER — AMPHETAMINE-DEXTROAMPHET ER 30 MG PO CP24: 30.0000 mg | ORAL_CAPSULE | Freq: Every day | ORAL | 0 refills | Status: DC

## 2018-06-24 MED ORDER — AMPHETAMINE-DEXTROAMPHETAMINE 10 MG PO TABS: ORAL_TABLET | ORAL | 0 refills | Status: DC

## 2018-06-24 NOTE — Progress Notes (Signed)
Virtual Visit via Video Note  I connected with [name] on 06/24/18 at  9:00 AM EDT by a video enabled telemedicine application and verified that I am speaking with the correct person using two identifiers.   I discussed the limitations of evaluation and management by telemedicine and the availability of in person appointments. The patient expressed understanding and agreed to proceed.    I discussed the assessment and treatment plan with the patient. The patient was provided an opportunity to ask questions and all were answered. The patient agreed with the plan and demonstrated an understanding of the instructions.   The patient was advised to call back or seek an in-person evaluation if the symptoms worsen or if the condition fails to improve as anticipated.   BH MD/PA/NP OP Progress Note  06/24/2018 1:05 PM TAKHIA SPOON  MRN:  [id number]  Chief Complaint:  Chief Complaint    Follow-up     HPI: [name] is a 38 year old Caucasian female who has a history of depression, anxiety disorder, insomnia, optic neuritis, multiple sclerosis, migraine headaches was evaluated by telemedicine today.  Patient today reports that she continues to to be compliant with her Zoloft and Wellbutrin.  She has not had any side effects from her Wellbutrin.  She reports her depressive symptoms is improving.  She reports the energy and fatigue as getting better.  She reports sleep as good on the trazodone.  She however has to work on her sleep schedule.  There has been times when she slept late and could not wake up in the morning.  She hence is working on her sleep hygiene.  Patient reports her mother-in-law came back from a cruise and recently tested positive.  She had a fall and fractured her femur and that is when they found out that she was positive for COVID-19.  Everybody else in the house is negative at this point.  She reports her husband as well as her father-in-law are currently in  quarantine.  Patient reports her work is going well.  She denies any other concerns.  She has upcoming appointment with Ms. Alden Hipp to start therapy sessions.     Visit Diagnosis:    ICD-10-CM   1. 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 GAD (generalized anxiety disorder) F41.1   3. Insomnia, unspecified type G47.00     Past Psychiatric History: I have reviewed past psychiatric history from my progress note on 05/21/2018.  Past trials of Zoloft, Adderall, trazodone.  Past Medical History:  Past Medical History:  Diagnosis Date  . Depression   . MS (multiple sclerosis) ([place])   . Multiple sclerosis ([place])    History reviewed. No pertinent surgical history.  Family Psychiatric History: I have reviewed family psychiatric history from my progress note on 05/21/2018  Family History:  Family History  Problem Relation Age of Onset  . Multiple myeloma Mother   . Multiple sclerosis Mother   . Obesity Sister   . Anxiety disorder Daughter   . Multiple sclerosis Maternal Aunt     Social History: Reviewed social history from my progress note on 05/21/2018 Social History   Socioeconomic History  . Marital status: Married    Spouse name: [name]  . Number of children: 3  . Years of education: Not on file  . Highest education level: Bachelor's degree (e.g., BA, AB, BS)  Occupational History  . Not on file  Social Needs  . Financial  resource strain: Not hard at all  . Food insecurity:    Worry: Never true    Inability: Never true  . Transportation needs:    Medical: No    Non-medical: No  Tobacco Use  . Smoking status: Never Smoker  . Smokeless tobacco: Never Used  Substance and Sexual Activity  . Alcohol use: Not Currently    Alcohol/week: 0.0 standard drinks    Comment: occasional/fim  . Drug use: No  . Sexual activity: Yes    Birth control/protection: I.U.D.  Lifestyle  . Physical activity:     Days per week: 0 days    Minutes per session: 0 min  . Stress: Only a little  Relationships  . Social connections:    Talks on phone: Not on file    Gets together: Not on file    Attends religious service: More than 4 times per year    Active member of club or organization: No    Attends meetings of clubs or organizations: Never    Relationship status: Married  Other Topics Concern  . Not on file  Social History Narrative  . Not on file    Allergies: No Known Allergies  Metabolic Disorder Labs: No results found for: HGBA1C, MPG No results found for: PROLACTIN Lab Results  Component Value Date   CHOL 147 12/02/2017   TRIG 136 12/02/2017   HDL 57 12/02/2017   CHOLHDL 2.6 12/02/2017   LDLCALC 63 12/02/2017   Lab Results  Component Value Date   TSH 1.240 09/08/2017    Therapeutic Level Labs: No results found for: LITHIUM No results found for: VALPROATE No components found for:  CBMZ  Current Medications: Current Outpatient Medications  Medication Sig Dispense Refill  . amphetamine-dextroamphetamine (ADDERALL XR) 30 MG 24 hr capsule Take 1 capsule (30 mg total) by mouth daily. 30 capsule 0  . amphetamine-dextroamphetamine (ADDERALL) 10 MG tablet Take one or two po in the aftrernoon 60 tablet 0  . baclofen (LIORESAL) 10 MG tablet TAKE 1 TABLET BY MOUTH THREE TIMES A DAY AS NEEDED FOR MUSCLE SPASMS 270 tablet 1  . betamethasone dipropionate (DIPROLENE) 0.05 % cream Apply topically 2 (two) times daily. 30 g 0  . buPROPion (WELLBUTRIN) 75 MG tablet Take 1 tablet (75 mg total) by mouth daily with breakfast. (Patient not taking: Reported on 06/03/2018) 90 tablet 0  . Cholecalciferol (VITAMIN D-1000 MAX ST) 1000 units tablet Take 5,000 Units by mouth daily.     . fluticasone (FLONASE) 50 MCG/ACT nasal spray SPRAY 2 SPRAYS INTO EACH NOSTRIL EVERY DAY    . ocrelizumab 600 mg in sodium chloride 0.9 % 500 mL Inject 600 mg into the vein every 6 (six) months.    . sertraline (ZOLOFT)  100 MG tablet TAKE 2 TABLETS BY MOUTH EVERY DAY 180 tablet 2  . topiramate (TOPAMAX) 100 MG tablet Take 1 tablet (100 mg total) by mouth at bedtime. 30 tablet 11  . traZODone (DESYREL) 50 MG tablet Take 1.5 tablets (75 mg total) by mouth at bedtime. 135 tablet 0  . triamcinolone (NASACORT ALLERGY 24HR) 55 MCG/ACT AERO nasal inhaler Place 1 spray into the nose as needed (Congestion).     No current facility-administered medications for this visit.      Musculoskeletal: Strength & Muscle Tone: within normal limits Gait & Station: seen as sitting Patient leans: N/A  Psychiatric Specialty Exam: Review of Systems  Psychiatric/Behavioral: The patient is nervous/anxious.   All other systems reviewed and are negative.  There were no vitals taken for this visit.There is no height or weight on file to calculate BMI.  General Appearance: Casual  Eye Contact:  Fair  Speech:  Clear and Coherent  Volume:  Normal  Mood:  Anxious  Affect:  Appropriate  Thought Process:  Goal Directed and Descriptions of Associations: Intact  Orientation:  Full (Time, Place, and Person)  Thought Content: Logical   Suicidal Thoughts:  No  Homicidal Thoughts:  No  Memory:  Immediate;   Fair Recent;   Fair Remote;   Fair  Judgement:  Fair  Insight:  Fair  Psychomotor Activity:  Normal  Concentration:  Concentration: Fair and Attention Span: Fair  Recall:  [employer] of Knowledge: Fair  Language: Fair  Akathisia:  No  Handed:  Right  AIMS (if indicated): Denies tremors, rigidity, stiffness  Assets:  Communication Skills Desire for Improvement Housing Social Support  ADL's:  Intact  Cognition: WNL  Sleep:  Fair   Screenings: GAD-7     Office Visit from 06/03/2018 in [place] Visit from 04/03/2018 in [place] Visit from 12/02/2017 in [place]  Total GAD-7 Score  5  16  2     PHQ2-9     Office Visit from 06/03/2018 in [place] Visit from 04/03/2018 in [facility] Visit from 12/02/2017 in [place] Visit from 11/18/2017 in [place]  PHQ-2 Total Score  2  5  2   0  PHQ-9 Total Score  8  18  9  8        Assessment and Plan: Shereen is a 38 year old Caucasian female, married, employed, lives in [place], has a history of multiple sclerosis, depression, fatigue, attention and concentration problems was evaluated by telemedicine today.  Patient is biologically predisposed given her family history as well as chronic multiple sclerosis diagnosis.  Patient also has psychosocial stressors of having to live with chronic medical problems, recent health issues of her mother-in-law, her husband being in quarantine and so on.  Patient however has good social support system.  Continue medications as noted below.  Plan Depressive disorder secondary to medical condition-MS-improving Wellbutrin 75 mg p.o. daily in the morning Zoloft 200 mg p.o. daily Continue CBT with therapist here in clinic  For insomnia-improving Trazodone 75 mg p.o. nightly She will continue to work on her sleep hygiene  For GAD-improving Zoloft as prescribed Continue CBT  Follow-up in clinic in 6 weeks or sooner if needed.  Appointment scheduled for July 1 at 10 AM.  I have spent atleast 15 minutes non face to face with patient today. More than 50 % of the time was spent for psychoeducation and supportive psychotherapy and care coordination.  This note was generated in part or whole with voice recognition software. Voice recognition is usually quite accurate but there are transcription errors that can and very often do occur. I apologize for any typographical errors that were not detected and corrected.        Ursula Alert, MD 06/24/2018, 1:05 PM

## 2018-06-24 NOTE — Telephone Encounter (Signed)
Intrafusion nurse-Taylor states she spoke with pt this am who said [facility] out of stock for generic and needing brand name.   I called [facility] to check on status of rx amphetamine -dextroamphetamine 30mg  XR tablet. Spoke with pharmacy tech. They now have generic and getting rx ready for pt. Nothing further needed.

## 2018-07-09 ENCOUNTER — Ambulatory Visit (INDEPENDENT_AMBULATORY_CARE_PROVIDER_SITE_OTHER): Payer: PRIVATE HEALTH INSURANCE | Admitting: Licensed Clinical Social Worker

## 2018-07-09 ENCOUNTER — Other Ambulatory Visit: Payer: Self-pay

## 2018-07-09 ENCOUNTER — Encounter: Payer: Self-pay | Admitting: Licensed Clinical Social Worker

## 2018-07-09 DIAGNOSIS — F411 Generalized anxiety disorder: Secondary | ICD-10-CM | POA: Diagnosis not present

## 2018-07-09 DIAGNOSIS — F0632 Mood disorder due to known physiological condition with major depressive-like episode: Secondary | ICD-10-CM

## 2018-07-09 NOTE — Progress Notes (Signed)
Comprehensive Clinical Assessment (CCA) Note  07/09/2018 [name] [id number]  Visit Diagnosis:      ICD-10-CM   1. 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 GAD (generalized anxiety disorder) F41.1       CCA Part One  Part One has been completed on paper by the patient.  (See scanned document in Chart Review)  CCA Part Two A  Intake/Chief Complaint:  CCA Intake With Chief Complaint CCA Part Two Date: 07/09/18 CCA Part Two Time: 0900 Chief Complaint/Presenting Problem: "I am a bit overwhelmed with lots of things. And, my doctor suggested starting therapy agan. I was in therapy previously but my insurance stopped covering it. Part of my being overwhelmed is my daughter suffering from anxiety. She's made some improvements, but she still has a ways to go."  Patients Currently Reported Symptoms/Problems: "I feel overwhelmed a lot. I also have MS. I was diagnosed three years ago. I had a decline about a year and a half ago. I'm doing better now, but I was not able to walk without a walker. The new medicine I'm one helps. But, my biggest problem is accepting that I have MS and that it means I should not expect myself to do as much as everyone else. And, the guilt that comes along with saying no I can't do that."  Collateral Involvement: N/A Individual's Strengths: good insight  Individual's Preferences: n/a Individual's Abilities: good communication  Type of Services Patient Feels Are Needed: individual therapy, medication management  Initial Clinical Notes/Concerns: n/a  Mental Health Symptoms Depression:  Depression: Change in energy/activity, Difficulty Concentrating, Fatigue, Hopelessness, Increase/decrease in appetite, Sleep (too much or little), Tearfulness, Weight gain/loss  Mania:  Mania: N/A  Anxiety:   Anxiety: Fatigue, Difficulty concentrating, Sleep  Psychosis:  Psychosis: N/A  Trauma:  Trauma: N/A  Obsessions:   Obsessions: N/A  Compulsions:  Compulsions: N/A  Inattention:  Inattention: N/A  Hyperactivity/Impulsivity:  Hyperactivity/Impulsivity: N/A  Oppositional/Defiant Behaviors:  Oppositional/Defiant Behaviors: N/A  Borderline Personality:  Emotional Irregularity: N/A  Other Mood/Personality Symptoms:      Mental Status Exam Appearance and self-care  Stature:  Stature: Average  Weight:  Weight: Average weight  Clothing:  Clothing: Casual  Grooming:  Grooming: Normal  Cosmetic use:  Cosmetic Use: Age appropriate  Posture/gait:  Posture/Gait: Normal  Motor activity:  Motor Activity: Not Remarkable  Sensorium  Attention:  Attention: Normal  Concentration:  Concentration: Normal  Orientation:  Orientation: X5  Recall/memory:  Recall/Memory: Normal  Affect and Mood  Affect:  Affect: Appropriate  Mood:  Mood: Depressed  Relating  Eye contact:  Eye Contact: Normal  Facial expression:  Facial Expression: Anxious  Attitude toward examiner:  Attitude Toward Examiner: Cooperative  Thought and Language  Speech flow: Speech Flow: Normal  Thought content:  Thought Content: Appropriate to mood and circumstances  Preoccupation:  Preoccupations: Guilt  Hallucinations:     Organization:     Company secretary of Knowledge:  Fund of Knowledge: Average  Intelligence:  Intelligence: Above Average  Abstraction:  Abstraction: Normal  Judgement:  Judgement: Normal  Reality Testing:  Reality Testing: Realistic  Insight:  Insight: Good  Decision Making:  Decision Making: Normal  Social Functioning  Social Maturity:  Social Maturity: Responsible  Social Judgement:  Social Judgement: Normal  Stress  Stressors:  Stressors: Transitions  Coping Ability:  Coping Ability: Normal  Skill Deficits:     Supports:      Family  and Psychosocial History: Family history Marital status: Married Number of Years Married: 1(Married second time in January 2020) Divorced, when?: Divorced from first husband  in 2011 What types of issues is patient dealing with in the relationship?: First husband: "We get along and can talk about the kids. We're pretty much on the same page about most things." Current marriage: "We actually don't live together right now. He has older children and w'eve decided to make that transition slowly because they've been through a lot with their mother."  Additional relationship information: n/a Are you sexually active?: Yes What is your sexual orientation?: heterosexual  Has your sexual activity been affected by drugs, alcohol, medication, or emotional stress?: n/a Does patient have children?: Yes How many children?: 3 How is patient's relationship with their children?: 44 year old son: "I thought it was pretty good--but he kind of shuts himself away in his room. I try to have [place] all eat dinner together." 102 year old daughter: "It's improved through her getting therapy. She calls me when she's upset and can open up more." 45 year old son: "I think it's pretty good. He's very loving and comes to hug me all the time. "  Childhood History:  Childhood History By whom was/is the patient raised?: Both parents Additional childhood history information: "My dad worked a Research officer, trade union. He wasn't there a whole lot. It was a good childhood."  Description of patient's relationship with caregiver when they were a child: Mom: "I don't remember any major hiccups." Dad: "He wasn't there a lot because he was working a lot. He was a hot head. So we spent a lot of our childhood trying to not make him mad."  Patient's description of current relationship with people who raised him/her: Mom & Dad: Good, they help me a lot.  How were you disciplined when you got in trouble as a child/adolescent?: "My dad, when we were younger, would spank [place]. I don't remember getting in a lot of trouble."  Does patient have siblings?: Yes Number of Siblings: 2 Description of patient's current relationship with siblings:  "Pretty good. My relationship with my brother is good. My relationship with my sister has not always been so good. We used to share a room and we are 5 years apart. 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  How long has patient been employed?: 16 years  Patient's job has been impacted by current illness: No What is the longest time patient has a held a job?: current job, 16 years  Where was the patient employed at that time?: 16 years  Did You Receive Any Psychiatric Treatment/Services While in Equities trader?: No Are There Guns or Other Weapons in Your Home?: No Are These Comptroller?: (n/a)  Education: Education School Currently Attending: n/a Last Grade Completed: 16 Name of High School: [employer]  Did Garment/textile technologist From [employer]?: Yes Did Theme park manager?: Yes What Type of College Degree Do you Have?: Musical Therapist  Did You Attend Graduate School?: No What Was Your Major?: Musical  Therapy Did You Have Any Special Interests In School?: N/a Did You Have An Individualized Education Program (IIEP): No Did You Have Any Difficulty At School?: No  Religion: Religion/Spirituality Are You A Religious Person?: Yes What is Your Religious Affiliation?: Methodist How Might This Affect Treatment?: N/a  Leisure/Recreation: Leisure / Recreation Leisure and Hobbies: "I like to craft. I like to scrap book."   Exercise/Diet: Exercise/Diet Do You Exercise?:  No Have You Gained or Lost A Significant Amount of Weight in the Past Six Months?: No Do You Follow a Special Diet?: No Do You Have Any Trouble Sleeping?: No  CCA Part Two C  Alcohol/Drug Use: Alcohol / Drug Use Pain Medications: SEE MAR Prescriptions: SEE MAR Over the Counter: SEE MAR History of alcohol / drug use?: No history of alcohol / drug abuse                      CCA Part Three  ASAM's:  Six Dimensions of Multidimensional Assessment  Dimension 1:  Acute Intoxication and/or Withdrawal Potential:     Dimension 2:  Biomedical Conditions and Complications:     Dimension 3:  Emotional, Behavioral, or Cognitive Conditions and Complications:     Dimension 4:  Readiness to Change:     Dimension 5:  Relapse, Continued use, or Continued Problem Potential:     Dimension 6:  Recovery/Living Environment:      Substance use Disorder (SUD)    Social Function:  Social Functioning Social Maturity: Responsible Social Judgement: Normal  Stress:  Stress Stressors: Transitions Coping Ability: Normal Patient Takes Medications The Way The Doctor Instructed?: Yes Priority Risk: Low Acuity  Risk Assessment- Self-Harm Potential: Risk Assessment For Self-Harm Potential Thoughts of Self-Harm: No current thoughts Method: No plan Availability of Means: No access/NA Additional Comments for Self-Harm Potential: N/A  Risk Assessment -Dangerous to Others Potential: Risk Assessment For Dangerous to Others Potential Method: No Plan Availability of Means: No access or NA Intent: Vague intent or NA Notification Required: No need or identified person Additional Comments for Danger to Others Potential: n/a  DSM5 Diagnoses: Patient Active Problem List   Diagnosis Date Noted  . Insomnia 12/02/2017  . Optic neuritis 09/12/2016  . Attention deficit disorder 09/01/2015  . History of migraine headaches 05/25/2015  . Headache, migraine 05/25/2015  . Allergic rhinitis, seasonal  05/25/2015  . Depression with anxiety 05/25/2015  . Relapsing remitting multiple sclerosis (HCC) 09/01/2014    Patient Centered Plan: Patient is on the following Treatment Plan(s):  Anxiety  Recommendations for Services/Supports/Treatments: Recommendations for Services/Supports/Treatments Recommendations For Services/Supports/Treatments: Medication Management, Individual Therapy  Treatment Plan Summary:    Referrals to Alternative Service(s): Referred to Alternative Service(s):   Place:   Date:   Time:    Referred to Alternative Service(s):   Place:   Date:   Time:    Referred to Alternative Service(s):   Place:   Date:   Time:    Referred to Alternative Service(s):   Place:   Date:   Time:     Heidi Dach, LCSW

## 2018-07-27 ENCOUNTER — Ambulatory Visit (INDEPENDENT_AMBULATORY_CARE_PROVIDER_SITE_OTHER): Payer: PRIVATE HEALTH INSURANCE | Admitting: Licensed Clinical Social Worker

## 2018-07-27 ENCOUNTER — Other Ambulatory Visit: Payer: Self-pay

## 2018-07-27 ENCOUNTER — Encounter: Payer: Self-pay | Admitting: Licensed Clinical Social Worker

## 2018-07-27 DIAGNOSIS — F0632 Mood disorder due to known physiological condition with major depressive-like episode: Secondary | ICD-10-CM | POA: Diagnosis not present

## 2018-07-27 NOTE — Progress Notes (Signed)
Virtual Visit via Video Note  I connected with [name] on 07/27/18 at  1:30 PM EDT by a video enabled telemedicine application and verified that I am speaking with the correct person using two identifiers.   I discussed the limitations of evaluation and management by telemedicine and the availability of in person appointments. The patient expressed understanding and agreed to proceed.  I discussed the assessment and treatment plan with the patient. The patient was provided an opportunity to ask questions and all were answered. The patient agreed with the plan and demonstrated an understanding of the instructions.   The patient was advised to call back or seek an in-person evaluation if the symptoms worsen or if the condition fails to improve as anticipated.  I provided 55 minutes of non-face-to-face time during this encounter.   Alden Hipp, LCSW    THERAPIST PROGRESS NOTE  Session Time: 1330  Participation Level: Active  Behavioral Response: CasualAlertAnxious  Type of Therapy: Individual Therapy  Treatment Goals addressed: Coping  Interventions: Supportive  Summary: TIMBER MARSHMAN is a 38 y.o. female who presents with continued symptoms related to her diagnosis. Kehlani repots feeling overwhelmed since our last session. She reports she is attempting to remodel and update her home, and feels she carries all the responsibility herself. "My kids won't help me at all. I feel resentful of that. My husband isn't living here right now, so I feel resentful that he's not helping me out." LCSW encouraged Letesha to discuss her frustrations with her husband and her children. Where her children are concerned, LCSW encouraged Aayana to have a frank discussion with them but also implement an incentive system where they are able to earn rewards for helping with home projects. With her husband, having a discussion around what she needs from him is also important. Additionally, LCSW asked  [place] to look at the amount of pressure she is putting on herself, and adjust her expectations of home renovations appropriately. For example, choose one day she expects herself to complete a home renovation task, and if she does things other days that's great, but if not, that's okay too. Zorana expressed understanding and agreement. We discussed changing the thought from, "I have to do all of this," to "I can do what I can." Ameia also expressed anxiety around a work related situation between her husband and father, "they were doing some work deal and my father is really pushing it and I feel stuck in the middle." LCSW encouraged Malaka to utilize assertive communication to set firm boundaries to remove herself from that particular situation. Fernando expressed agreement and we walked through several examples of how to approach that situation.   Suicidal/Homicidal: No  Therapist Response: Loyda continues to work towards her tx goals but has not yet reached them. We will continue to work on emotional regulation skills and improving communication.   Plan: Return again in 3 weeks.  Diagnosis: Axis I: Deprissve Disorder due to another medical condition with MDD like episode    Axis II: No diagnosis    Alden Hipp, LCSW 07/27/2018

## 2018-07-28 ENCOUNTER — Other Ambulatory Visit: Payer: Self-pay

## 2018-07-28 ENCOUNTER — Other Ambulatory Visit: Payer: Self-pay | Admitting: Neurology

## 2018-07-29 MED ORDER — AMPHETAMINE-DEXTROAMPHET ER 30 MG PO CP24: 30.0000 mg | ORAL_CAPSULE | Freq: Every day | ORAL | 0 refills | Status: DC

## 2018-07-29 MED ORDER — AMPHETAMINE-DEXTROAMPHETAMINE 10 MG PO TABS: ORAL_TABLET | ORAL | 0 refills | Status: DC

## 2018-07-29 NOTE — Telephone Encounter (Signed)
Pt requesting adderall 10mg . [place] [facility] checked. Last ordered by [facility] on 06/24/18 by Dr. Brett Fairy on behalf of Dr. Felecia Shelling but [place] [facility] does not reflect this RX. Last adderall 10mg  RX shown on database was filled in May of 2019, ordered by Dr. Randol Kern.   Pt is up to date on appts. Due for a refill on adderall 10 mg per epic records.

## 2018-07-29 NOTE — Telephone Encounter (Signed)
[place] [facility] checked. Last RX for adderall 30mg  written and filled on 06/24/18 by Dr. Felecia Shelling per database.  Pt is due for a refill and up to date on her appts.

## 2018-08-05 ENCOUNTER — Ambulatory Visit: Payer: PRIVATE HEALTH INSURANCE | Admitting: Psychiatry

## 2018-08-12 ENCOUNTER — Other Ambulatory Visit: Payer: Self-pay | Admitting: Psychiatry

## 2018-08-12 DIAGNOSIS — F0632 Mood disorder due to known physiological condition with major depressive-like episode: Secondary | ICD-10-CM

## 2018-08-14 ENCOUNTER — Other Ambulatory Visit: Payer: Self-pay

## 2018-08-14 ENCOUNTER — Encounter: Payer: Self-pay | Admitting: Psychiatry

## 2018-08-14 ENCOUNTER — Ambulatory Visit (INDEPENDENT_AMBULATORY_CARE_PROVIDER_SITE_OTHER): Payer: PRIVATE HEALTH INSURANCE | Admitting: Psychiatry

## 2018-08-14 DIAGNOSIS — G4700 Insomnia, unspecified: Secondary | ICD-10-CM | POA: Diagnosis not present

## 2018-08-14 DIAGNOSIS — F0632 Mood disorder due to known physiological condition with major depressive-like episode: Secondary | ICD-10-CM | POA: Insufficient documentation

## 2018-08-14 DIAGNOSIS — F411 Generalized anxiety disorder: Secondary | ICD-10-CM

## 2018-08-14 MED ORDER — TRAZODONE HCL 50 MG PO TABS: 75.0000 mg | ORAL_TABLET | Freq: Every day | ORAL | 0 refills | Status: DC

## 2018-08-14 MED ORDER — BUPROPION HCL 75 MG PO TABS: 75.0000 mg | ORAL_TABLET | Freq: Every day | ORAL | 0 refills | Status: DC

## 2018-08-14 NOTE — Progress Notes (Signed)
Virtual Visit via Video Note  I connected with [name] on 08/14/18 at  9:45 AM EDT by a video enabled telemedicine application and verified that I am speaking with the correct person using two identifiers.   I discussed the limitations of evaluation and management by telemedicine and the availability of in person appointments. The patient expressed understanding and agreed to proceed.   I discussed the assessment and treatment plan with the patient. The patient was provided an opportunity to ask questions and all were answered. The patient agreed with the plan and demonstrated an understanding of the instructions.   The patient was advised to call back or seek an in-person evaluation if the symptoms worsen or if the condition fails to improve as anticipated.   [facility] MD OP Progress Note  08/14/2018 1:36 PM [name]  MRN:  [id number]  Chief Complaint:  Chief Complaint    Follow-up     HPI: [name] is a 38 year old Caucasian female who has a history of depression secondary to medical problems, GAD, insomnia, optic neuritis, multiple sclerosis, migraine headaches was evaluated by telemedicine today.  Patient today reports that she is currently making progress on the medications.  She reports her mood as better on the Zoloft and Wellbutrin.  She denies any side effects.  She continues to struggle with problems due to the MS.  She reports recently she went through an episode of inability to find the right words which lasted only for a short period of time.  She reports she has had flareups like this previously which does not last too long.  It is likely due to multiple lesions that she has in the brain at this time.  Patient reports sleep is good on the trazodone.  She reports she continues to try to help out her mother-in-law who is currently recovering from fracture.  He denies any other concerns today.  Visit Diagnosis:    ICD-10-CM   1. Depressive disorder due to another  medical condition with major depressive-like episode  F06.32 buPROPion (WELLBUTRIN) 75 MG tablet   Multiple sclerosis  2. GAD (generalized anxiety disorder)  F41.1   3. Insomnia, unspecified type  G47.00 traZODone (DESYREL) 50 MG tablet    Past Psychiatric History: I have reviewed past psychiatric history from my progress note on 05/21/2018.  Past trials of Zoloft, Adderall, trazodone.  Past Medical History:  Past Medical History:  Diagnosis Date  . Depression   . MS (multiple sclerosis) ([place])   . Multiple sclerosis ([place])    History reviewed. No pertinent surgical history.  Family Psychiatric History: I have reviewed family psychiatric history from my progress note on 05/21/2018. Family History:  Family History  Problem Relation Age of Onset  . Multiple myeloma Mother   . Multiple sclerosis Mother   . Obesity Sister   . Anxiety disorder Daughter   . Multiple sclerosis Maternal Aunt     Social History: I have reviewed social history from my progress note on 05/21/2018. Social History   Socioeconomic History  . Marital status: Married    Spouse name: steven ambrose  . Number of children: 3  . Years of education: Not on file  . Highest education level: Bachelor's degree (e.g., BA, AB, BS)  Occupational History  . Not on file  Social Needs  . Financial resource strain: Not hard at all  . Food insecurity    Worry: Never true    Inability: Never true  . Transportation needs  Medical: No    Non-medical: No  Tobacco Use  . Smoking status: Never Smoker  . Smokeless tobacco: Never Used  Substance and Sexual Activity  . Alcohol use: Not Currently    Alcohol/week: 0.0 standard drinks    Comment: occasional/fim  . Drug use: No  . Sexual activity: Yes    Birth control/protection: I.U.D.  Lifestyle  . Physical activity    Days per week: 0 days    Minutes per session: 0 min  . Stress: Only a little  Relationships  . Social Herbalist on phone: Not on file     Gets together: Not on file    Attends religious service: More than 4 times per year    Active member of club or organization: No    Attends meetings of clubs or organizations: Never    Relationship status: Married  Other Topics Concern  . Not on file  Social History Narrative  . Not on file    Allergies: No Known Allergies  Metabolic Disorder Labs: No results found for: HGBA1C, MPG No results found for: PROLACTIN Lab Results  Component Value Date   CHOL 147 12/02/2017   TRIG 136 12/02/2017   HDL 57 12/02/2017   CHOLHDL 2.6 12/02/2017   LDLCALC 63 12/02/2017   Lab Results  Component Value Date   TSH 1.240 09/08/2017    Therapeutic Level Labs: No results found for: LITHIUM No results found for: VALPROATE No components found for:  CBMZ  Current Medications: Current Outpatient Medications  Medication Sig Dispense Refill  . amphetamine-dextroamphetamine (ADDERALL XR) 30 MG 24 hr capsule Take 1 capsule (30 mg total) by mouth daily. 30 capsule 0  . amphetamine-dextroamphetamine (ADDERALL) 10 MG tablet Take one or two po in the aftrernoon 60 tablet 0  . baclofen (LIORESAL) 10 MG tablet TAKE 1 TABLET BY MOUTH THREE TIMES A DAY AS NEEDED FOR MUSCLE SPASMS 270 tablet 1  . betamethasone dipropionate (DIPROLENE) 0.05 % cream Apply topically 2 (two) times daily. 30 g 0  . buPROPion (WELLBUTRIN) 75 MG tablet Take 1 tablet (75 mg total) by mouth daily with breakfast. 90 tablet 0  . Cholecalciferol (VITAMIN D-1000 MAX ST) 1000 units tablet Take 5,000 Units by mouth daily.     . fluticasone (FLONASE) 50 MCG/ACT nasal spray SPRAY 2 SPRAYS INTO EACH NOSTRIL EVERY DAY    . ocrelizumab 600 mg in sodium chloride 0.9 % 500 mL Inject 600 mg into the vein every 6 (six) months.    . sertraline (ZOLOFT) 100 MG tablet TAKE 2 TABLETS BY MOUTH EVERY DAY 180 tablet 2  . topiramate (TOPAMAX) 100 MG tablet Take 1 tablet (100 mg total) by mouth at bedtime. 30 tablet 11  . traZODone (DESYREL) 50 MG tablet  Take 1.5 tablets (75 mg total) by mouth at bedtime. 135 tablet 0  . triamcinolone (NASACORT ALLERGY 24HR) 55 MCG/ACT AERO nasal inhaler Place 1 spray into the nose as needed (Congestion).     No current facility-administered medications for this visit.      Musculoskeletal: Strength & Muscle Tone: UTA Gait & Station: normal Patient leans: N/A  Psychiatric Specialty Exam: Review of Systems  Psychiatric/Behavioral: The patient is nervous/anxious.   All other systems reviewed and are negative.   There were no vitals taken for this visit.There is no height or weight on file to calculate BMI.  General Appearance: Casual  Eye Contact:  Fair  Speech:  Clear and Coherent  Volume:  Normal  Mood:  Anxious  Affect:  Appropriate  Thought Process:  Goal Directed and Descriptions of Associations: Intact  Orientation:  Full (Time, Place, and Person)  Thought Content: Logical   Suicidal Thoughts:  No  Homicidal Thoughts:  No  Memory:  Immediate;   Fair Recent;   Fair Remote;   Fair  Judgement:  Fair  Insight:  Fair  Psychomotor Activity:  Normal  Concentration:  Concentration: Fair and Attention Span: Fair  Recall:  [employer] of Knowledge: Fair  Language: Fair  Akathisia:  No  Handed:  Right  AIMS (if indicated): Denies tremors, rigidity  Assets:  Communication Skills Desire for Improvement Housing Social Support  ADL's:  Intact  Cognition: WNL  Sleep:  Fair   Screenings: GAD-7     Office Visit from 06/03/2018 in [facility] Visit from 04/03/2018 in [facility] Visit from 12/02/2017 in [facility]  Total GAD-7 Score  5  16  2     PHQ2-9     Office Visit from 06/03/2018 in [facility] Visit from 04/03/2018 in [facility] Visit from 12/02/2017 in [facility] Visit from 11/18/2017 in [facility]  PHQ-2 Total Score  2  5  2   0  PHQ-9 Total Score  8  18  9  8         Assessment and Plan: [name] is a 38 year old Caucasian female, married, employed, lives in [place], has a history of MS, depression, fatigue, GAD, insomnia was evaluated by telemedicine today.  Patient did biological predisposition given her family history as well as chronic multiple sclerosis.  She also has psychosocial stressors of her chronic medical problems, recent health issues of her mother-in-law and COVID-19 outbreak.  Patient however is currently making progress.  Plan as noted below.  Plan Depressive disorder secondary to medical condition-MS-improving Wellbutrin 75 mg p.o. daily Zoloft 200 mg p.o. daily  For insomnia-improving Trazodone 75 mg p.o. nightly She will continue to work on her sleep hygiene  For GAD-improving Zoloft as prescribed Continue CBT  Follow-up in clinic in 1 to 2 months or sooner if needed.  Appointment scheduled for September 30 at 10 AM  I have spent atleast 15 minutes non face to face with patient today. More than 50 % of the time was spent for psychoeducation and supportive psychotherapy and care coordination.  This note was generated in part or whole with voice recognition software. Voice recognition is usually quite accurate but there are transcription errors that can and very often do occur. I apologize for any typographical errors that were not detected and corrected.        Ursula Alert, MD 08/14/2018, 1:36 PM

## 2018-08-19 ENCOUNTER — Ambulatory Visit: Payer: PRIVATE HEALTH INSURANCE | Admitting: Neurology

## 2018-08-19 ENCOUNTER — Encounter: Payer: Self-pay | Admitting: Neurology

## 2018-08-19 ENCOUNTER — Other Ambulatory Visit: Payer: Self-pay

## 2018-08-19 VITALS — BP 105/73 | HR 92 | Temp 98.7°F | Wt 181.5 lb

## 2018-08-19 DIAGNOSIS — E559 Vitamin D deficiency, unspecified: Secondary | ICD-10-CM | POA: Diagnosis not present

## 2018-08-19 DIAGNOSIS — G43009 Migraine without aura, not intractable, without status migrainosus: Secondary | ICD-10-CM

## 2018-08-19 DIAGNOSIS — G35 Multiple sclerosis: Secondary | ICD-10-CM | POA: Diagnosis not present

## 2018-08-19 DIAGNOSIS — Z79899 Other long term (current) drug therapy: Secondary | ICD-10-CM | POA: Diagnosis not present

## 2018-08-19 DIAGNOSIS — F988 Other specified behavioral and emotional disorders with onset usually occurring in childhood and adolescence: Secondary | ICD-10-CM

## 2018-08-19 DIAGNOSIS — R269 Unspecified abnormalities of gait and mobility: Secondary | ICD-10-CM

## 2018-08-19 NOTE — Progress Notes (Signed)
[facility]  PATIENT: [name] DOB: [birth year]  REFERRING DOCTOR OR PCP:  Dr. Jennings Books  [facility]) SOURCE: Patient, a couple MRI reports and images on PACS. Lab reports, notes from Dr. Brigitte Pulse  _________________________________   HISTORICAL  CHIEF COMPLAINT:  Chief Complaint  Patient presents with  . Follow-up    RM 12, alone. Last seen 02/18/18. Pt reports it has become harder for her to articulate what she needs to say. Comes out jumbled. Works on speaking slowly. Calling objects the wrong names. Also having touble with basic spelling.  . Multiple Sclerosis    On Ocrevus. :Last infusion May 2020. Next infusion November 2020  . Migraine    Takes topamax  . Fatigue    Takes adderall 40m XR qd and 173mprn    HISTORY OF PRESENT ILLNESS:  HiValisa Ballard a [age].o. woman with relapsing remitting multiple sclerosis.     Update 08/19/2018: She feels she is doing okay on Ocrevus but she is noting more fatigue, left arm /hand weakness and right leg weakness/stiffness since the last visit.    She plays guitar and this has been more difficult.    While playing piano, the left hand has been slower but guitar difficulty is new.    She is playing guitar daily as exercise for her hands.    She takes baclofen 10 mg in am and 20 mg at night   She tolerates Ocrevus.   Labwork in January 2020 was fine.     She has insomnia and was placed on trazodone by psychiatry.    She feels it has helped.  She is noting mild cognitive issues, especially verbal fluency.    Verbal processing is also a problem, especially if two people are talking at once.   She feels mood is doing well.   Increasing zoloft and Wellbutrin have helped.     Adderall has helped the decreased focus and attention relate to her MS.  She takes 30 mg XR in the am and 10 mg later in the day.     She has migraines and is better on Topamax 100 mg.  Of note, the Topamax has been longer than her verbal fluency  issues.       Update 02/18/2018: She is on Ocrevus.   She tolerates it well and has not had any exacerbations.  MRI 09/2017 showed no new MS lesions.  She did have shingles and influenza B illness a couple months ago.    Now is having sinus drainage/pressure.  Gait is about the same.   Balance is off but no worse than last visit.   She has right > left leg weakness and spasticity.   She takes baclofen 10 in am and 20 mg qHS, occ. taking one extra one..    Vision seems the same but she had a lot of difficulty seeing after hr eyes were dilated (eye check was fine).   Bladder function is ok.  She is on oxybutynin but has a dry mouth.   She is feeling more fatigued lately.   She takes Adderall XR 30 mg in the am and an additional 10 mg IR in the afternoon.   This helps with fatigue and attention.   Lots of anxiety this month with Wedding scheduled this weekend.   Depression ok.  She also notes a constant vibration sensation all over.  Also, she notes more word finding difficulty.    She feels she generally does  worse in the heat.   She goes to a lot of nursing home/assisted living and they are often hot/humid.     Migraines are doing ok on Topamax.   Update 09/08/2017: Over the last couple weeks, she has noted more neurologic symptoms including numbness and weakness in the legs, right greater than left.    Current symptoms started about 2 weeks ago.  She denies any viral or bacterial infections.     She is also noting pain in the right leg.    Initially, she had left facial numbness but that resolved aftr a few days.      Of note, she was doing painting about 3 weeks ago, a couple days before current symptoms started, and her leg was giving out and had some pain.     She has also noted more fatigue.  Adderall 30 mg in am and 10 mg at one pm has helped but she is noting more issues in the afternoons,   She is noting more verbal fluency issues.      Vision is fine.   She notes more bladder issues.   Oxybutynin  causes dry mouth.  She had her last Ocrevus injection about 3 months ago and she is tolerating it well.    Her last MRI was 09/2016 and showed no new lesions.      Update 08/20/2017: She feels she has been stable on Ocrevus without any recent exacerbation.    At the last visit she had had a cough without fever and CXR was fine.   Her gait is doing well and she rarely uses the walker (just if tired and walking outside).     She has not been able to afford Ampyra.      Her right leg is weaker and more spastic than her left leg.   She notes it more when standing and singing as she feels itr also affects her balance.   She takes baclofen 10 in am and 20 mg in evening.   Oxybutynin helps her bladder function.    She has some fatigue that is helped by Adderall.  She tolerates it well.  She is sleeping well most nights.  Mood is doing okay.  Cognition is okay with some attention deficit helped by the Adderall.  Migraines are doing fairly well.   She did better on topiramate but ran out a few months ago.    Update 02/21/2017: She is on Ocrevus and tolerates it well.  She has not had any exacerbations and feels a little bit better.  She feels gait is better on Ampyra.   She now Hardly ever uses a walker though she still has to touch the walls for balance. Speed is greatly improved. The right leg is weaker and more spastic than the left leg. He is having less leg pain. Vision has been stable.   She has urinary urgency and frequency that is helped by the oxybutynin. However,  she notes more dry mouth.   We discussed oxybutynin is most likely to cause this if she can change it to an as-needed basis.   Headaches are better on TPM 50 mg.  she tolerates that well. She was on 100 but was able to reduce the dose. She continues to note fatigue and attention deficit. Adderall has helped this. Mood has done better recently. She reports sleeping well most nights but less so since night time baclofen was held.  . She takes  trazodone.  From 10/16/2016:  MS:   She had an exacerbation in 2017 and early 2018 switch to ocrelizumab with her first doses in May 2018 (was on Gilenya).   She had another small exacerbation in July (right leg stiff/weak) and she received 3 days of IV site.  She went from using a cane to a walker for balance.  She tolerates the ocrelizumab well.     Gait/strength/sensatoin:  She is using a walker for long distances but is able to walk shorter distances without it.   Her right leg is weaker than her left and gets stiff.   It is painful when stiff.  Her foot drags on the right and catches the floor causing a stumble.   Gait worsenend with the exacerbation in 2017.  She was walking without a cane last year.    Baclofen has helped the spasticity slightly.   There is some allodynia in the legs at times and also the right arm..  We discussed Ampyra for her walking  Vision: She has numbness around her left eye and she feels there is reduced left vision with dots and squiggles in the vision.  The left eye also hurts, worse with movements.. She denies diplopia..  She denies vertigo.  HA:   Migraine headaches have done better on Topamax.  It does cause a dry mouth and change in taste. She now is just having a couple migraines a month.   When present, they are located in the right forehead and sometimes also the right occiput.   When present, pain is throbbing.    When intense, she also has photophobia and phonophobia.   Moving worsens the pain. She takes NSAIDs with mild benefit..    Bladder:   She is doing better, oxybutynin has helped.   Fatigue/sleep: She has physical and cognitive fatigue. This starts in the mornings but is worse later in the day and if she gets hot. Sometimes she also feels sleepy. Adderall is helping some.   The higher dose of Adderall is only helping a bit.   She is on Adderall XR 30 mg in the am and 10 mg IR at 1 pm.  She sometimes has sleep onset and sleep maintenance insomnia.    Mood/cognition: She notes some depression and frustration.  She sees [employer].   She also has noted difficulty with cognitive skills over the past couple of years.     Her mom has MS and her MS stabilized after a bone marrow transplant for cancer.  MS history:   In late June or early July 2011, she had the onset of numbness that went up to her belly button. She presented to the emergency room. An MRI of the brain showed 1 periventricular focus with maybe a second subtle periventricular focus. An MRI of the cervical spine showed an enhancing lesion adjacent to C3-C4. The diagnosis of possible MS was made but the evidence was not enough to begin a disease modifying therapy. In 2015, she had the onset of similar symptoms and also had decreased ability to use the left hand. An MRI of the brain at that time showed several new lesions not present in 2011, including a focus in the right middle cerebellar peduncle additionally, she had a lumbar puncture consistent with MS. Because of her symptoms, MRI changes and lumbar puncture results, she was diagnosed with clinical definite relapsing remitting MS and was started on Tecfidera. She has continued on Tecfidera. Over the past few months she has noted more difficulty with  focus and concentration.     She also has had some change in vision more recently. Because of these newer symptoms, and MRI was repeated 04/27/2015. The MRI of the cervical spine showed no new lesions. though the MRI of the brain showed 2  lesions not present in 2015, one in the left juxtacortical white matter and one in the left deep white matter.  She had a small exacerbation in August 2017 and received IV SOlu-Medrol.   MRI cervical spine 2017 an MRI of the brain show two spinal plaques, a definite rightt middle cerebellar peduncle plaque and a possible right pontine plaque and several foci in the hemispheres  MRI 01/2016 showed no new lesions.  She is JCV Ab positive.      REVIEW OF SYSTEMS:  Constitutional: No fevers, chills, sweats, or change in appetite.  She has fatigue and insomnia. Eyes: No visual changes, double vision, eye pain Ear, nose and throat: No hearing loss, ear pain, nasal congestion, sore throat Cardiovascular: No chest pain, palpitations Respiratory: No shortness of breath at rest or with exertion.   No wheezes GastrointestinaI: No nausea, vomiting, diarrhea, abdominal pain, fecal incontinence Genitourinary:   She notes frequency, some hesitancy at times and nocturia. Musculoskeletal: No neck pain, back pain Integumentary: No rash, pruritus, skin lesions Neurological: as above Psychiatric: as above Endocrine: No palpitations, diaphoresis, change in appetite, change in weigh or increased thirst Hematologic/Lymphatic: No anemia, purpura, petechiae. Allergic/Immunologic: No itchy/runny eyes, nasal congestion, recent allergic reactions, rashes  ALLERGIES: No Known Allergies  HOME MEDICATIONS:  Current Outpatient Medications:  .  amphetamine-dextroamphetamine (ADDERALL XR) 30 MG 24 hr capsule, Take 1 capsule (30 mg total) by mouth daily., Disp: 30 capsule, Rfl: 0 .  amphetamine-dextroamphetamine (ADDERALL) 10 MG tablet, Take one or two po in the aftrernoon, Disp: 60 tablet, Rfl: 0 .  baclofen (LIORESAL) 10 MG tablet, TAKE 1 TABLET BY MOUTH THREE TIMES A DAY AS NEEDED FOR MUSCLE SPASMS, Disp: 270 tablet, Rfl: 1 .  betamethasone dipropionate (DIPROLENE) 0.05 % cream, Apply topically 2 (two) times daily., Disp: 30 g, Rfl: 0 .  buPROPion (WELLBUTRIN) 75 MG tablet, Take 1 tablet (75 mg total) by mouth daily with breakfast., Disp: 90 tablet, Rfl: 0 .  Cholecalciferol (VITAMIN D-1000 MAX ST) 1000 units tablet, Take 5,000 Units by mouth daily. , Disp: , Rfl:  .  fluticasone (FLONASE) 50 MCG/ACT nasal spray, SPRAY 2 SPRAYS INTO EACH NOSTRIL EVERY DAY, Disp: , Rfl:  .  ocrelizumab 600 mg in sodium chloride 0.9 % 500 mL, Inject 600 mg into the vein every 6 (six)  months., Disp: , Rfl:  .  sertraline (ZOLOFT) 100 MG tablet, TAKE 2 TABLETS BY MOUTH EVERY DAY, Disp: 180 tablet, Rfl: 2 .  topiramate (TOPAMAX) 100 MG tablet, Take 1 tablet (100 mg total) by mouth at bedtime., Disp: 30 tablet, Rfl: 11 .  traZODone (DESYREL) 50 MG tablet, Take 1.5 tablets (75 mg total) by mouth at bedtime., Disp: 135 tablet, Rfl: 0 .  triamcinolone (NASACORT ALLERGY 24HR) 55 MCG/ACT AERO nasal inhaler, Place 1 spray into the nose as needed (Congestion)., Disp: , Rfl:   PAST MEDICAL HISTORY: Past Medical History:  Diagnosis Date  . Depression   . MS (multiple sclerosis) ([place])   . Multiple sclerosis ([place])     PAST SURGICAL HISTORY: No past surgical history on file.  FAMILY HISTORY: Family History  Problem Relation Age of Onset  . Multiple myeloma Mother   . Multiple sclerosis Mother   .  Obesity Sister   . Anxiety disorder Daughter   . Multiple sclerosis Maternal Aunt     SOCIAL HISTORY:  Social History   Socioeconomic History  . Marital status: Married    Spouse name: steven ambrose  . Number of children: 3  . Years of education: Not on file  . Highest education level: Bachelor's degree (e.g., BA, AB, BS)  Occupational History  . Not on file  Social Needs  . Financial resource strain: Not hard at all  . Food insecurity    Worry: Never true    Inability: Never true  . Transportation needs    Medical: No    Non-medical: No  Tobacco Use  . Smoking status: Never Smoker  . Smokeless tobacco: Never Used  Substance and Sexual Activity  . Alcohol use: Not Currently    Alcohol/week: 0.0 standard drinks    Comment: occasional/fim  . Drug use: No  . Sexual activity: Yes    Birth control/protection: I.U.D.  Lifestyle  . Physical activity    Days per week: 0 days    Minutes per session: 0 min  . Stress: Only a little  Relationships  . Social Herbalist on phone: Not on file    Gets together: Not on file    Attends religious service: More  than 4 times per year    Active member of club or organization: No    Attends meetings of clubs or organizations: Never    Relationship status: Married  . Intimate partner violence    Fear of current or ex partner: No    Emotionally abused: No    Physically abused: No    Forced sexual activity: No  Other Topics Concern  . Not on file  Social History Narrative  . Not on file     PHYSICAL EXAM  Vitals:   08/19/18 1107  BP: 105/73  Pulse: 92  Temp: 98.7 F (37.1 C)  Weight: 181 lb 8 oz (82.3 kg)    Body mass index is 33.2 kg/m.   General: The patient is well-developed and well-nourished and in no acute distress    Musculoskeletal:    The neck is nontender with a good range of motion.  Neurologic Exam  Mental status: The patient is alert and oriented x 3 at the time of the examination. The patient has apparent normal recent and remote memory, with an apparently normal attention span and concentration ability.   Speech is normal.  Cranial nerves: Extraocular movements are full.  Facial strength and sensation is normal.  Trapezius strength is normal.  The tongue is midline, and the patient has symmetric elevation of the soft palate. No obvious hearing deficits are noted.  Motor:  Muscle bulk is normal.   Tone is mildly increased in legs, bilaterally fairly equally.  Strength is  5 / 5 in all 4 extremities except 4+/5 in the feet.   Sensory: Sensory testing shows reduced vibration in the right leg but touch is symmetric.    Arms are symmetric.    Coordination: Cerebellar testing shows good finger-nose-finger but reduced heel-to-shin slightly worse on the right than left.     Gait and station: Station is normal.  She has a mild right foot drop.  The tandem gait is wide.  Romberg is negative..   Reflexes: Deep tendon reflexes are symmetric and increased in her legs. She has crossed adductor responses at the knees.       DIAGNOSTIC DATA (LABS, IMAGING,  TESTING) - I  reviewed patient records, labs, notes, testing and imaging myself where available.  Lab Results  Component Value Date   WBC 9.5 02/18/2018   HGB 14.7 02/18/2018   HCT 45.5 02/18/2018   MCV 87 02/18/2018   PLT 387 02/18/2018      Component Value Date/Time   NA 142 09/08/2017 1533   NA 141 07/28/2013 0410   K 3.8 09/08/2017 1533   K 3.7 07/28/2013 0410   CL 103 09/08/2017 1533   CL 103 07/28/2013 0410   CO2 22 09/08/2017 1533   CO2 29 07/28/2013 0410   GLUCOSE 92 09/08/2017 1533   GLUCOSE 98 09/08/2016 1855   GLUCOSE 121 (H) 07/28/2013 0410   BUN 15 09/08/2017 1533   BUN 16 07/28/2013 0410   CREATININE 0.76 09/08/2017 1533   CREATININE 0.66 07/28/2013 0410   CALCIUM 9.6 09/08/2017 1533   CALCIUM 8.6 07/28/2013 0410   PROT 7.0 02/18/2018 0911   PROT 6.7 07/25/2013 0410   ALBUMIN 4.5 02/18/2018 0911   ALBUMIN 3.3 (L) 07/25/2013 0410   AST 11 02/18/2018 0911   AST 20 07/25/2013 0410   ALT 11 02/18/2018 0911   ALT 18 07/25/2013 0410   ALKPHOS 106 02/18/2018 0911   ALKPHOS 49 07/25/2013 0410   BILITOT 0.3 02/18/2018 0911   BILITOT 0.4 07/25/2013 0410   GFRNONAA 99 09/08/2017 1533   GFRNONAA >60 07/28/2013 0410   GFRAA 114 09/08/2017 1533   GFRAA >60 07/28/2013 0410       ASSESSMENT AND PLAN  1. Relapsing remitting multiple sclerosis ([place])   2. High risk medication use   3. Vitamin D deficiency   4. Migraine without aura and without status migrainosus, not intractable   5. Attention deficit disorder, unspecified hyperactivity presence   6. Gait disturbance      1.   She will continue Ocrevus.  Check CBC with Diff, IgG/IgM/IgA.    2.   Continue the Adderall 30 mg XR in the morning with 10 mg as needed later in the day for her reduced attention.    Continue Topamax for migraines 3.    Check vitamin D level and supplement as needed 4.   Return to clinic in 5-6 months or sooner if there are new or worsening neurologic symptoms.   Veralyn Lopp A. Felecia Shelling, MD, PhD  0/21/1155, 2:08 PM Certified in Neurology, Clinical Neurophysiology, Sleep Medicine, Pain Medicine and Neuroimaging  [facility] [address], [place], [place] [address] [phone] olll

## 2018-08-20 ENCOUNTER — Telehealth: Payer: Self-pay | Admitting: *Deleted

## 2018-08-20 DIAGNOSIS — E559 Vitamin D deficiency, unspecified: Secondary | ICD-10-CM

## 2018-08-20 LAB — CBC WITH DIFFERENTIAL/PLATELET
Basophils Absolute: 0 10*3/uL (ref 0.0–0.2)
Basos: 1 %
EOS (ABSOLUTE): 0.2 10*3/uL (ref 0.0–0.4)
Eos: 2 %
Hematocrit: 41 % (ref 34.0–46.6)
Hemoglobin: 14.1 g/dL (ref 11.1–15.9)
Immature Grans (Abs): 0 10*3/uL (ref 0.0–0.1)
Immature Granulocytes: 0 %
Lymphocytes Absolute: 1.4 10*3/uL (ref 0.7–3.1)
Lymphs: 19 %
MCH: 28.5 pg (ref 26.6–33.0)
MCHC: 34.4 g/dL (ref 31.5–35.7)
MCV: 83 fL (ref 79–97)
Monocytes Absolute: 0.6 10*3/uL (ref 0.1–0.9)
Monocytes: 8 %
Neutrophils Absolute: 5.2 10*3/uL (ref 1.4–7.0)
Neutrophils: 70 %
Platelets: 404 10*3/uL (ref 150–450)
RBC: 4.94 x10E6/uL (ref 3.77–5.28)
RDW: 13.4 % (ref 11.7–15.4)
WBC: 7.6 10*3/uL (ref 3.4–10.8)

## 2018-08-20 LAB — IGG, IGA, IGM
IgA/Immunoglobulin A, Serum: 109 mg/dL (ref 87–352)
IgG (Immunoglobin G), Serum: 988 mg/dL (ref 586–1602)
IgM (Immunoglobulin M), Srm: 123 mg/dL (ref 26–217)

## 2018-08-20 LAB — VITAMIN D 25 HYDROXY (VIT D DEFICIENCY, FRACTURES): Vit D, 25-Hydroxy: 29.5 ng/mL — ABNORMAL LOW (ref 30.0–100.0)

## 2018-08-20 MED ORDER — VITAMIN D (ERGOCALCIFEROL) 1.25 MG (50000 UNIT) PO CAPS: 50000.0000 [IU] | ORAL_CAPSULE | ORAL | 0 refills | Status: DC

## 2018-08-20 NOTE — Telephone Encounter (Signed)
-----   Message from Britt Bottom, MD sent at 08/20/2018  3:20 PM EDT ----- Vit D is just a little low    Lets do 50000 weekly x 26 weeks then 5000 U OTC daily

## 2018-08-21 ENCOUNTER — Other Ambulatory Visit: Payer: Self-pay | Admitting: Neurology

## 2018-08-24 ENCOUNTER — Ambulatory Visit: Payer: PRIVATE HEALTH INSURANCE | Admitting: Licensed Clinical Social Worker

## 2018-08-29 ENCOUNTER — Other Ambulatory Visit: Payer: Self-pay

## 2018-08-31 MED ORDER — AMPHETAMINE-DEXTROAMPHET ER 30 MG PO CP24: 30.0000 mg | ORAL_CAPSULE | Freq: Every day | ORAL | 0 refills | Status: DC

## 2018-08-31 NOTE — Telephone Encounter (Signed)
[place] Database Verified LR: 07-29-2018 Qty: 30 Pending appointment: 02-22-2019 (Dr. Felecia Shelling)

## 2018-08-31 NOTE — Telephone Encounter (Signed)
Dr. Felecia Shelling patient. -VRP

## 2018-09-03 ENCOUNTER — Other Ambulatory Visit: Payer: Self-pay | Admitting: Neurology

## 2018-09-03 MED ORDER — DALFAMPRIDINE ER 10 MG PO TB12: ORAL_TABLET | ORAL | 11 refills | Status: DC

## 2018-09-05 ENCOUNTER — Other Ambulatory Visit: Payer: Self-pay | Admitting: Neurology

## 2018-09-16 ENCOUNTER — Other Ambulatory Visit: Payer: Self-pay

## 2018-09-16 ENCOUNTER — Encounter: Payer: Self-pay | Admitting: Licensed Clinical Social Worker

## 2018-09-16 ENCOUNTER — Ambulatory Visit (INDEPENDENT_AMBULATORY_CARE_PROVIDER_SITE_OTHER): Payer: PRIVATE HEALTH INSURANCE | Admitting: Licensed Clinical Social Worker

## 2018-09-16 DIAGNOSIS — F0632 Mood disorder due to known physiological condition with major depressive-like episode: Secondary | ICD-10-CM

## 2018-09-16 DIAGNOSIS — F411 Generalized anxiety disorder: Secondary | ICD-10-CM | POA: Diagnosis not present

## 2018-09-16 NOTE — Progress Notes (Signed)
Virtual Visit via Video Note  I connected with [name] on 09/16/18 at  8:00 AM EDT by a video enabled telemedicine application and verified that I am speaking with the correct person using two identifiers.   I discussed the limitations of evaluation and management by telemedicine and the availability of in person appointments. The patient expressed understanding and agreed to proceed.  I discussed the assessment and treatment plan with the patient. The patient was provided an opportunity to ask questions and all were answered. The patient agreed with the plan and demonstrated an understanding of the instructions.   The patient was advised to call back or seek an in-person evaluation if the symptoms worsen or if the condition fails to improve as anticipated.  I provided 60 minutes of non-face-to-face time during this encounter.   Kristen Hipp, LCSW    THERAPIST PROGRESS NOTE  Session Time: 0800  Participation Level: Active  Behavioral Response: NeatAlertAnxious  Type of Therapy: Individual Therapy  Treatment Goals addressed: Anxiety  Interventions: Supportive  Summary: [name] is a 38 y.o. female who presents with continued symptoms of her diagnosis. [name] reports doing well since our last session. She reports she is still struggling with setting smaller goals for her home renovation projects. "I just feel like if I do one thing, then I have to do these other things too." LCSW encouraged [name] to recognize those things can be broken down into smaller goals, and she has to actively work at that. [name] expressed understanding and agreement. We reviewed how we could break her projects down into smaller goals that are mor attainable. She reported feeling she uses having MS as an excuse not to do things. LCSW encouraged [name] to ask herself while doing things if she is actually not able to do things physically or if she is guarding her body as she is afraid she will cause  a flare up in her MS. She reported understanding, and stated she would attempt to start figuring out which it is in a situation. LCSW reminded [name] to only do as much as her body says she is able to do. [name] expressed agreement. We went on to discussing her relationship with her children, and the older two not wanting to do things with her. LCSW encouraged [name] to try to figure out what motivates her children and utilize that to spend more time with them, or to have an open discussion with them about it. [name] expressed understanding and agreement with this idea as well.   Suicidal/Homicidal: No   Therapist Response: [name] continues to work towards her tx goals but has not yet reached them. We will continue to work on communication and goal setting moving forward.   Plan: Return again in 4 weeks.  Diagnosis: Axis I: Generalized Anxiety Disorder    Axis II: No diagnosis    Kristen Hipp, LCSW 09/16/2018

## 2018-09-30 ENCOUNTER — Other Ambulatory Visit: Payer: Self-pay

## 2018-10-01 MED ORDER — AMPHETAMINE-DEXTROAMPHET ER 30 MG PO CP24: 30.0000 mg | ORAL_CAPSULE | Freq: Every day | ORAL | 0 refills | Status: DC

## 2018-10-01 NOTE — Telephone Encounter (Signed)
[place] Database Verified LR: 08-31-2018 Qty: 30 Pending appointment: 02-22-2019

## 2018-10-07 ENCOUNTER — Encounter: Payer: Self-pay | Admitting: Licensed Clinical Social Worker

## 2018-10-07 ENCOUNTER — Ambulatory Visit (INDEPENDENT_AMBULATORY_CARE_PROVIDER_SITE_OTHER): Payer: PRIVATE HEALTH INSURANCE | Admitting: Licensed Clinical Social Worker

## 2018-10-07 ENCOUNTER — Other Ambulatory Visit: Payer: Self-pay

## 2018-10-07 DIAGNOSIS — F411 Generalized anxiety disorder: Secondary | ICD-10-CM

## 2018-10-07 DIAGNOSIS — F0632 Mood disorder due to known physiological condition with major depressive-like episode: Secondary | ICD-10-CM | POA: Diagnosis not present

## 2018-10-07 NOTE — Progress Notes (Signed)
  Virtual Visit via Video Note  I connected with [name] on 10/07/18 at  9:00 AM EDT by a video enabled telemedicine application and verified that I am speaking with the correct person using two identifiers.   I discussed the limitations of evaluation and management by telemedicine and the availability of in person appointments. The patient expressed understanding and agreed to proceed.  I discussed the assessment and treatment plan with the patient. The patient was provided an opportunity to ask questions and all were answered. The patient agreed with the plan and demonstrated an understanding of the instructions.   The patient was advised to call back or seek an in-person evaluation if the symptoms worsen or if the condition fails to improve as anticipated.  I provided 53 minutes of non-face-to-face time during this encounter.   Alden Hipp, LCSW   THERAPIST PROGRESS NOTE  Session Time: 0900  Participation Level: Active  Behavioral Response: NeatAlertAnxious  Type of Therapy: Individual Therapy  Treatment Goals addressed: Coping  Interventions: Supportive  Summary: KARLEEN SEEBECK is a 38 y.o. female who presents with continued symptoms related to her diagnosis. Stevi reports doing well since our last session. She reports feeling there have been a lot of stressful moments, but states she has been able to manage her feelings in the moment without becoming too overwhelmed. Shakaria reported the primary source of stress for her at the moment is the praise band at church. 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 She reports the praise band has continued practicing without her, and has done special projects without even contacting her. She had a meeting with other members of the band, and she essentially felt she needed to fight for her position at the church. We discussed how to know if it's time to move on. We  discussed weighing the pros/cons of the position. LCSW asked Serayah if she could come up with three positives about being in the band, which she was not able to do. Odessie was able to see she was staying in the position due to a feeling of obligation to the church and to God. We discussed what being a good Christian means to her, and how the praise band fits into that. Erleen was able to articulate she did not feel the praise band was contributing to her greater purpose. LCSW was in agreement and held space for [facility] to process her feelings around the situation. We moved on to discussing Damariz's daughter, and how she feels as though she is failing her as she is not doing well in school. LCSW asked Clella to recognize she cannot be everything for everyone, and she may need help with teaching her daughter--and that is okay. Earleen expressed understanding and agreement and stated, "I need to remember that phrase. I can't be everything for everyone."   Suicidal/Homicidal: No  Therapist Response: Emaleigh continues to work towards her tx goals but has not yet reached them. She is improving on assertive communication and managing anxiety in the moment, and we will continue to work on improving CBT skills moving forward.   Plan: Return again in 2 weeks.  Diagnosis: Axis I: Generalized Anxiety Disorder    Axis II: No diagnosis    Alden Hipp, LCSW 10/07/2018

## 2018-10-09 ENCOUNTER — Ambulatory Visit: Payer: PRIVATE HEALTH INSURANCE | Admitting: Licensed Clinical Social Worker

## 2018-10-28 ENCOUNTER — Ambulatory Visit (INDEPENDENT_AMBULATORY_CARE_PROVIDER_SITE_OTHER): Payer: PRIVATE HEALTH INSURANCE | Admitting: Licensed Clinical Social Worker

## 2018-10-28 ENCOUNTER — Other Ambulatory Visit: Payer: Self-pay

## 2018-10-28 ENCOUNTER — Encounter: Payer: Self-pay | Admitting: Licensed Clinical Social Worker

## 2018-10-28 DIAGNOSIS — F0632 Mood disorder due to known physiological condition with major depressive-like episode: Secondary | ICD-10-CM

## 2018-10-28 DIAGNOSIS — F411 Generalized anxiety disorder: Secondary | ICD-10-CM

## 2018-10-28 NOTE — Progress Notes (Signed)
Virtual Visit via Video Note  I connected with [name] on 10/28/18 at  9:00 AM EDT by a video enabled telemedicine application and verified that I am speaking with the correct person using two identifiers.   I discussed the limitations of evaluation and management by telemedicine and the availability of in person appointments. The patient expressed understanding and agreed to proceed.  I discussed the assessment and treatment plan with the patient. The patient was provided an opportunity to ask questions and all were answered. The patient agreed with the plan and demonstrated an understanding of the instructions.   The patient was advised to call back or seek an in-person evaluation if the symptoms worsen or if the condition fails to improve as anticipated.  I provided 57 minutes of non-face-to-face time during this encounter.   Alden Hipp, LCSW    THERAPIST PROGRESS NOTE  Session Time: 0900  Participation Level: Active  Behavioral Response: MeticulousAlertAnxious  Type of Therapy: Individual Therapy  Treatment Goals addressed: Coping  Interventions: Supportive  Summary: [name] is a 38 y.o. female who presents with continued symptoms related to her diagnosis. Jalee reports doing well since our last session, but noted increased stress around her children and virtual learning. She stated her children are currently failing most of their classes, and she does not know how to motivate them to improve their performance. LCSW validated her feelings, and explained that most mothers LCSW speaks with are going through the same problems with virtual learning. LCSW held space for Arrielle to discuss her fears and feelings around this topic, and she ultimately added her ex-husband is calling her constantly asking her why she "can't get the kids in gear." LCSW validated those frustrations and encouraged Berda to set boundaries with her ex, as he is not assisting with virtual  learning so his criticisms are not helpful. She reported his comments make her feel worse, so she could see the value in setting a boundary. Jacari went on to discussing her husband now, who lives with his parents but notes his children are "doing great in school." Najae expressed frustration with this information as it makes her feel inadequate. We discussed how different kids learn differently, and they have extra parental supervision in the husband's home but not in hers--so she is doing the best she can. The constant theme throughout the session was finding confidence in oneself to know they are doing the best they can do. Valissa expressed understanding and agreement with this information.    Suicidal/Homicidal: No   Therapist Response: Jeanette continues to work towards her tx goals but has not yet reached them. We will continue to work on emotional regulation skills and assertiveness training.   Plan: Return again in 4 weeks.  Diagnosis: Axis I: Generalized Anxiety Disorder    Axis II: No diagnosis    Alden Hipp, LCSW 10/28/2018

## 2018-10-29 ENCOUNTER — Other Ambulatory Visit: Payer: Self-pay

## 2018-11-02 MED ORDER — AMPHETAMINE-DEXTROAMPHET ER 30 MG PO CP24: 30.0000 mg | ORAL_CAPSULE | Freq: Every day | ORAL | 0 refills | Status: DC

## 2018-11-02 NOTE — Telephone Encounter (Signed)
 Database Verified LR: 10/01/2018 Qty: 30 Pending appointment: 02-22-2019

## 2018-11-04 ENCOUNTER — Other Ambulatory Visit: Payer: Self-pay

## 2018-11-04 ENCOUNTER — Ambulatory Visit (INDEPENDENT_AMBULATORY_CARE_PROVIDER_SITE_OTHER): Payer: PRIVATE HEALTH INSURANCE | Admitting: Psychiatry

## 2018-11-04 DIAGNOSIS — Z5329 Procedure and treatment not carried out because of patient's decision for other reasons: Secondary | ICD-10-CM

## 2018-11-04 DIAGNOSIS — Z91199 Patient's noncompliance with other medical treatment and regimen due to unspecified reason: Secondary | ICD-10-CM

## 2018-11-04 NOTE — Progress Notes (Signed)
No response to call initially . Patient arrived 15 minutes late at the end of her visit - advised to reschedule

## 2018-11-10 ENCOUNTER — Other Ambulatory Visit: Payer: Self-pay | Admitting: Neurology

## 2018-11-10 MED ORDER — DALFAMPRIDINE ER 10 MG PO TB12: ORAL_TABLET | ORAL | 11 refills | Status: DC

## 2018-11-21 ENCOUNTER — Other Ambulatory Visit: Payer: Self-pay | Admitting: Psychiatry

## 2018-11-21 DIAGNOSIS — G4700 Insomnia, unspecified: Secondary | ICD-10-CM

## 2018-11-22 ENCOUNTER — Other Ambulatory Visit: Payer: Self-pay | Admitting: Neurology

## 2018-11-22 DIAGNOSIS — E559 Vitamin D deficiency, unspecified: Secondary | ICD-10-CM

## 2018-11-26 ENCOUNTER — Telehealth: Payer: Self-pay | Admitting: *Deleted

## 2018-11-26 ENCOUNTER — Encounter: Payer: Self-pay | Admitting: Licensed Clinical Social Worker

## 2018-11-26 ENCOUNTER — Ambulatory Visit (INDEPENDENT_AMBULATORY_CARE_PROVIDER_SITE_OTHER): Payer: PRIVATE HEALTH INSURANCE | Admitting: Licensed Clinical Social Worker

## 2018-11-26 ENCOUNTER — Other Ambulatory Visit: Payer: Self-pay

## 2018-11-26 DIAGNOSIS — F411 Generalized anxiety disorder: Secondary | ICD-10-CM

## 2018-11-26 NOTE — Telephone Encounter (Signed)
Submitted PA dalfampridine on CMM. Key: [id number]. Waiting on determination

## 2018-11-26 NOTE — Progress Notes (Signed)
  Virtual Visit via Video Note  I connected with [name] on 11/26/18 at  9:00 AM EDT by a video enabled telemedicine application and verified that I am speaking with the correct person using two identifiers.   I discussed the limitations of evaluation and management by telemedicine and the availability of in person appointments. The patient expressed understanding and agreed to proceed.  I discussed the assessment and treatment plan with the patient. The patient was provided an opportunity to ask questions and all were answered. The patient agreed with the plan and demonstrated an understanding of the instructions.   The patient was advised to call back or seek an in-person evaluation if the symptoms worsen or if the condition fails to improve as anticipated.  I provided 60 minutes of non-face-to-face time during this encounter.   Kristen Hipp, LCSW  THERAPIST PROGRESS NOTE  Session Time: 0900  Participation Level: Active  Behavioral Response: NeatAlertAnxious  Type of Therapy: Individual Therapy  Treatment Goals addressed: Coping  Interventions: CBT  Summary: [name] is a 38 y.o. female who presents with continued symptoms related to her diagnosis. [name] reported having increase anxiety and depression symptoms due to conflicts with her children's school. She reports she attempted to communicate with the school and discuss the problems her children are having with online school. She reports the school responded unkindly, and ultimately stated it was the parents' responsibility to ensure their kids are online doing school appropriately. [name] noted feeling defeated and unsure how to proceed. However, she was able to describe her plan moving forward for her daughter and oldest son. She plans to pull them from their current school and put them in home school. She reported her youngest son is doing better in school than the other two, so she plans to allow him to continue at  his current school. LCSW validated [name]'s feelings and actions around this topic, and encouraged her to continue focusing on the aspects she has control over. We discussed how, if her kids fail this year, that's ultimately on them. If she has done everything she can do, she cannot put blame on herself for her children's lack of caring regarding their school career. [name] was able to hear this and expressed agreement. 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   Suicidal/Homicidal: No   Therapist Response: [name] continues to work towards her tx goals but has not yet reached them. We will continue to work on emotional regulation skills moving forward.   Plan: Return again in 4 weeks.  Diagnosis: Axis I: Generalized Anxiety Disorder    Axis II: No diagnosis    Kristen Hipp, LCSW 11/26/2018

## 2018-11-30 ENCOUNTER — Other Ambulatory Visit: Payer: Self-pay

## 2018-11-30 ENCOUNTER — Other Ambulatory Visit: Payer: Self-pay | Admitting: Psychiatry

## 2018-11-30 DIAGNOSIS — F0632 Mood disorder due to known physiological condition with major depressive-like episode: Secondary | ICD-10-CM

## 2018-11-30 MED ORDER — AMPHETAMINE-DEXTROAMPHET ER 30 MG PO CP24: 30.0000 mg | ORAL_CAPSULE | Freq: Every day | ORAL | 0 refills | Status: DC

## 2018-11-30 NOTE — Telephone Encounter (Signed)
[facility] Database Verified LR: 11-02-2018 Qty: 30 Pending appointment: 02-22-2019

## 2018-12-01 NOTE — Telephone Encounter (Signed)
PA denied. Submitted urgent PA to [facility] by fax at [phone]. Received fax confirmation.

## 2018-12-03 NOTE — Telephone Encounter (Signed)
Received fax from [place] (contact [phone]) providing approval for dalfampridine.  The letter was dated 12/03/2018 and stated the approval was good for 182 days. Pt JE#[id number].  Tracking # of appeal#[id number].

## 2018-12-04 ENCOUNTER — Ambulatory Visit (INDEPENDENT_AMBULATORY_CARE_PROVIDER_SITE_OTHER): Payer: PRIVATE HEALTH INSURANCE | Admitting: Nurse Practitioner

## 2018-12-04 ENCOUNTER — Other Ambulatory Visit: Payer: Self-pay

## 2018-12-04 ENCOUNTER — Encounter: Payer: Self-pay | Admitting: Nurse Practitioner

## 2018-12-04 VITALS — BP 112/76 | HR 91 | Temp 98.8°F | Ht 63.0 in | Wt 178.0 lb

## 2018-12-04 DIAGNOSIS — Z0000 Encounter for general adult medical examination without abnormal findings: Secondary | ICD-10-CM | POA: Diagnosis not present

## 2018-12-04 DIAGNOSIS — Z23 Encounter for immunization: Secondary | ICD-10-CM | POA: Diagnosis not present

## 2018-12-04 DIAGNOSIS — E559 Vitamin D deficiency, unspecified: Secondary | ICD-10-CM | POA: Diagnosis not present

## 2018-12-04 DIAGNOSIS — G35 Multiple sclerosis: Secondary | ICD-10-CM

## 2018-12-04 NOTE — Patient Instructions (Addendum)
[facility]  Address: [address], [place], [place] [address]  Phone: [phone]   Living With Depression Everyone experiences occasional disappointment, sadness, and loss in their lives. When you are feeling down, blue, or sad for at least 2 weeks in a row, it may mean that you have depression. Depression can affect your thoughts and feelings, relationships, daily activities, and physical health. It is caused by changes in the way your brain functions. If you receive a diagnosis of depression, your health care provider will tell you which type of depression you have and what treatment options are available to you. If you are living with depression, there are ways to help you recover from it and also ways to prevent it from coming back. How to cope with lifestyle changes Coping with stress     Stress is your body's reaction to life changes and events, both good and bad. Stressful situations may include:  Getting married.  The death of a spouse.  Losing a job.  Retiring.  Having a baby. Stress can last just a few hours or it can be ongoing. Stress can play a major role in depression, so it is important to learn both how to cope with stress and how to think about it differently. Talk with your health care provider or a counselor if you would like to learn more about stress reduction. He or she may suggest some stress reduction techniques, such as:  Music therapy. This can include creating music or listening to music. Choose music that you enjoy and that inspires you.  Mindfulness-based meditation. This kind of meditation can be done while sitting or walking. It involves being aware of your normal breaths, rather than trying to control your breathing.  Centering prayer. This is a kind of meditation that involves focusing on a spiritual word or phrase. Choose a word, phrase, or sacred image that is meaningful to you and that brings you peace.   Deep breathing. To do this, expand your stomach and inhale slowly through your nose. Hold your breath for 3-5 seconds, then exhale slowly, allowing your stomach muscles to relax.  Muscle relaxation. This involves intentionally tensing muscles then relaxing them. Choose a stress reduction technique that fits your lifestyle and personality. Stress reduction techniques take time and practice to develop. Set aside 5-15 minutes a day to do them. Therapists can offer training in these techniques. The training may be covered by some insurance plans. Other things you can do to manage stress include:  Keeping a stress diary. This can help you learn what triggers your stress and ways to control your response.  Understanding what your limits are and saying no to requests or events that lead to a schedule that is too full.  Thinking about how you respond to certain situations. You may not be able to control everything, but you can control how you react.  Adding humor to your life by watching funny films or TV shows.  Making time for activities that help you relax and not feeling guilty about spending your time this way.  Medicines Your health care provider may suggest certain medicines if he or she feels that they will help improve your condition. Avoid using alcohol and other substances that may prevent your medicines from working properly (may interact). It is also important to:  Talk with your pharmacist or health care provider about all the medicines that you take, their possible side effects, and what medicines are safe   to take together.  Make it your goal to take part in all treatment decisions (shared decision-making). This includes giving input on the side effects of medicines. It is best if shared decision-making with your health care provider is part of your total treatment plan. If your health care provider prescribes a medicine, you may not notice the full benefits of it for 4-8 weeks. Most  people who are treated for depression need to be on medicine for at least 6-12 months after they feel better. If you are taking medicines as part of your treatment, do not stop taking medicines without first talking to your health care provider. You may need to have the medicine slowly decreased (tapered) over time to decrease the risk of harmful side effects. Relationships Your health care provider may suggest family therapy along with individual therapy and drug therapy. While there may not be family problems that are causing you to feel depressed, it is still important to make sure your family learns as much as they can about your mental health. Having your family's support can help make your treatment successful. How to recognize changes in your condition Everyone has a different response to treatment for depression. Recovery from major depression happens when you have not had signs of major depression for two months. This may mean that you will start to:  Have more interest in doing activities.  Feel less hopeless than you did 2 months ago.  Have more energy.  Overeat less often, or have better or improving appetite.  Have better concentration. Your health care provider will work with you to decide the next steps in your recovery. It is also important to recognize when your condition is getting worse. Watch for these signs:  Having fatigue or low energy.  Eating too much or too little.  Sleeping too much or too little.  Feeling restless, agitated, or hopeless.  Having trouble concentrating or making decisions.  Having unexplained physical complaints.  Feeling irritable, angry, or aggressive. Get help as soon as you or your family members notice these symptoms coming back. How to get support and help from others How to talk with friends and family members about your condition  Talking to friends and family members about your condition can provide you with one way to get support  and guidance. Reach out to trusted friends or family members, explain your symptoms to them, and let them know that you are working with a health care provider to treat your depression. Financial resources Not all insurance plans cover mental health care, so it is important to check with your insurance carrier. If paying for co-pays or counseling services is a problem, search for a local or county mental health care center. They may be able to offer public mental health care services at low or no cost when you are not able to see a private health care provider. If you are taking medicine for depression, you may be able to get the generic form, which may be less expensive. Some makers of prescription medicines also offer help to patients who cannot afford the medicines they need. Follow these instructions at home:   Get the right amount and quality of sleep.  Cut down on using caffeine, tobacco, alcohol, and other potentially harmful substances.  Try to exercise, such as walking or lifting small weights.  Take over-the-counter and prescription medicines only as told by your health care provider.  Eat a healthy diet that includes plenty of vegetables, fruits, whole grains, low-fat   dairy products, and lean protein. Do not eat a lot of foods that are high in solid fats, added sugars, or salt.  Keep all follow-up visits as told by your health care provider. This is important. Contact a health care provider if:  You stop taking your antidepressant medicines, and you have any of these symptoms: ? Nausea. ? Headache. ? Feeling lightheaded. ? Chills and body aches. ? Not being able to sleep (insomnia).  You or your friends and family think your depression is getting worse. Get help right away if:  You have thoughts of hurting yourself or others. If you ever feel like you may hurt yourself or others, or have thoughts about taking your own life, get help right away. You can go to your nearest  emergency department or call:  Your local emergency services (911 in the [place]  A suicide crisis helpline, such as the National Suicide Prevention Lifeline at 1-800-273-8255. This is open 24-hours a day. Summary  If you are living with depression, there are ways to help you recover from it and also ways to prevent it from coming back.  Work with your health care team to create a management plan that includes counseling, stress management techniques, and healthy lifestyle habits. This information is not intended to replace advice given to you by your health care provider. Make sure you discuss any questions you have with your health care provider. Document Released: 12/25/2015 Document Revised: 05/15/2018 Document Reviewed: 12/25/2015 Elsevier Patient Education  2020 [employer]  

## 2018-12-04 NOTE — Progress Notes (Signed)
BP 112/76   Pulse 91   Temp 98.8 F (37.1 C) (Oral)   Ht 5' 3" (1.6 m)   Wt 178 lb (80.7 kg)   SpO2 99%   BMI 31.53 kg/m    Subjective:    Patient ID: [name], female    DOB: 23-Dec-1980, 38 y.o.   MRN: [id number]  HPI: [name] is a 38 y.o. female presenting on 12/04/2018 for comprehensive medical examination. Current medical complaints include:none  She currently lives with: husband and children Menopausal Symptoms: no    MS: Followed by neuro, last seen 08/19/18.  Next transfusion is in December 7th, 2020, last had in May (Ocrevus).  Reports some increased fatigue at this time, which she states is common as it gets closer to her transfusion time.  Report adequate symptom control on current regimen.  Takes Adderall 30 MG XR daily and 10 MG PRN for fatigue.  DEPRESSION Followed by psychiatry and last seen by Dr. Shea Evans 08/14/18, is also attending therapy sessions.  Mood status: stable Satisfied with current treatment?: yes Symptom severity: moderate  Duration of current treatment : chronic Side effects: no Medication compliance: good compliance Psychotherapy/counseling: yes current Previous psychiatric medications: Sertraline, Wellbutrin, Trazodone Depressed mood: yes Anxious mood: yes Anhedonia: no Significant weight loss or gain: no Insomnia: yes hard to fall asleep Fatigue: yes Feelings of worthlessness or guilt: no Impaired concentration/indecisiveness: yes Suicidal ideations: no Hopelessness: no Crying spells: yes Depression screen [facility] 2/9 12/04/2018 06/03/2018 04/03/2018 12/02/2017 11/18/2017  Decreased Interest _0 0  Down, Depressed, Hopeless 2 0 2 1 0  PHQ - 2 Score _1 0  Altered sleeping 1 0 1 1 0  Tired, decreased energy _2 Change in appetite 3 0 _3 Feeling bad or failure about yourself  _4 0 1  Trouble concentrating _5 Moving slowly or fidgety/restless 0 0 0 0 1  Suicidal thoughts 0 0 0 0 0  PHQ-9 Score _6 Difficult doing work/chores Somewhat difficult Not difficult at all Somewhat difficult Very difficult Not difficult at all   GAD 7 : Generalized Anxiety Score 12/04/2018 06/03/2018 04/03/2018 12/02/2017  Nervous, Anxious, on Edge 1 0 1 0  Control/stop worrying 3 0 3 0  Worry too much - different things _7 0  Trouble relaxing _8 Restless 2 0 2 0  Easily annoyed or irritable _9 Afraid - awful might happen 0 1 2 0  Total GAD 7 Score _10 Anxiety Difficulty Somewhat difficult - Somewhat difficult Not difficult at all    The patient does not have a history of falls. I did not complete a risk assessment for falls. A plan of care for falls was not documented.   Past Medical History:  Past Medical History:  Diagnosis Date  . Depression   . MS (multiple sclerosis) ([place])   . Multiple sclerosis ([place])     Surgical History:  History reviewed. No pertinent surgical history.  Medications:  Current Outpatient Medications on File Prior to Visit  Medication Sig  . amphetamine-dextroamphetamine (ADDERALL XR) 30 MG 24 hr capsule Take 1 capsule (30 mg total) by mouth daily.  Marland Kitchen amphetamine-dextroamphetamine (ADDERALL) 10 MG tablet Take one or two po in the aftrernoon  . baclofen (LIORESAL) 10 MG tablet TAKE 1 TABLET BY  MOUTH THREE TIMES A DAY AS NEEDED FOR MUSCLE SPASMS  . buPROPion (WELLBUTRIN) 75 MG tablet TAKE 1 TABLET (75 MG TOTAL) BY MOUTH DAILY WITH BREAKFAST.  Marland Kitchen Cholecalciferol (VITAMIN D-1000 MAX ST) 1000 units tablet Take 5,000 Units by mouth daily.   . fluticasone (FLONASE) 50 MCG/ACT nasal spray SPRAY 2 SPRAYS INTO EACH NOSTRIL EVERY DAY  . ocrelizumab 600 mg in sodium chloride 0.9 % 500 mL Inject 600 mg into the vein every 6 (six) months.  . sertraline (ZOLOFT) 100 MG tablet TAKE 2 TABLETS BY MOUTH EVERY DAY  . topiramate (TOPAMAX) 100 MG tablet TAKE 1 TABLET BY MOUTH EVERYDAY AT BEDTIME  . traZODone (DESYREL) 50 MG tablet TAKE 1.5 TABLETS (75 MG TOTAL) BY  MOUTH AT BEDTIME.  Marland Kitchen Vitamin D, Ergocalciferol, (DRISDOL) 1.25 MG (50000 UT) CAPS capsule Take 1 capsule (50,000 Units total) by mouth every 7 (seven) days. Take 1 capsule weekly for 26 weeks and then go to 5000U OTC daily after  . dalfampridine 10 MG TB12 One po q12 hours (Patient not taking: Reported on 12/04/2018)   No current facility-administered medications on file prior to visit.     Allergies:  No Known Allergies  Social History:  Social History   Socioeconomic History  . Marital status: Married    Spouse name: steven ambrose  . Number of children: 3  . Years of education: Not on file  . Highest education level: Bachelor's degree (e.g., BA, AB, BS)  Occupational History  . Not on file  Social Needs  . Financial resource strain: Not hard at all  . Food insecurity    Worry: Never true    Inability: Never true  . Transportation needs    Medical: No    Non-medical: No  Tobacco Use  . Smoking status: Never Smoker  . Smokeless tobacco: Never Used  Substance and Sexual Activity  . Alcohol use: Not Currently    Alcohol/week: 0.0 standard drinks    Comment: occasional/fim  . Drug use: No  . Sexual activity: Yes    Birth control/protection: I.U.D.  Lifestyle  . Physical activity    Days per week: 0 days    Minutes per session: 0 min  . Stress: Only a little  Relationships  . Social Herbalist on phone: Not on file    Gets together: Not on file    Attends religious service: More than 4 times per year    Active member of club or organization: No    Attends meetings of clubs or organizations: Never    Relationship status: Married  . Intimate partner violence    Fear of current or ex partner: No    Emotionally abused: No    Physically abused: No    Forced sexual activity: No  Other Topics Concern  . Not on file  Social History Narrative  . Not on file   Social History   Tobacco Use  Smoking Status Never Smoker  Smokeless Tobacco Never Used    Social History   Substance and Sexual Activity  Alcohol Use Not Currently  . Alcohol/week: 0.0 standard drinks   Comment: occasional/fim    Family History:  Family History  Problem Relation Age of Onset  . Multiple myeloma Mother   . Multiple sclerosis Mother   . Obesity Sister   . Anxiety disorder Daughter   . Multiple sclerosis Maternal Aunt     Past medical history, surgical history, medications, allergies, family history and social history  reviewed with patient today and changes made to appropriate areas of the chart.   Review of Systems - negative All other ROS negative except what is listed above and in the HPI.      Objective:    BP 112/76   Pulse 91   Temp 98.8 F (37.1 C) (Oral)   Ht 5' 3" (1.6 m)   Wt 178 lb (80.7 kg)   SpO2 99%   BMI 31.53 kg/m   Wt Readings from Last 3 Encounters:  12/04/18 178 lb (80.7 kg)  08/19/18 181 lb 8 oz (82.3 kg)  04/03/18 186 lb 3.2 oz (84.5 kg)    Physical Exam Constitutional:      General: She is awake. She is not in acute distress.    Appearance: She is well-developed. She is not ill-appearing.  HENT:     Head: Normocephalic and atraumatic.     Right Ear: Hearing, tympanic membrane, ear canal and external ear normal. No drainage.     Left Ear: Hearing, tympanic membrane, ear canal and external ear normal. No drainage.     Nose: Nose normal.     Right Sinus: No maxillary sinus tenderness or frontal sinus tenderness.     Left Sinus: No maxillary sinus tenderness or frontal sinus tenderness.     Mouth/Throat:     Mouth: Mucous membranes are moist.     Pharynx: Oropharynx is clear. Uvula midline. No pharyngeal swelling, oropharyngeal exudate or posterior oropharyngeal erythema.  Eyes:     General: Lids are normal.        Right eye: No discharge.        Left eye: No discharge.     Extraocular Movements: Extraocular movements intact.     Conjunctiva/sclera: Conjunctivae normal.     Pupils: Pupils are equal, round, and  reactive to light.     Visual Fields: Right eye visual fields normal and left eye visual fields normal.  Neck:     Musculoskeletal: Normal range of motion and neck supple.     Thyroid: No thyromegaly.     Vascular: No carotid bruit.     Trachea: Trachea normal.  Cardiovascular:     Rate and Rhythm: Normal rate and regular rhythm.     Heart sounds: Normal heart sounds. No murmur. No gallop.   Pulmonary:     Effort: Pulmonary effort is normal. No accessory muscle usage or respiratory distress.     Breath sounds: Normal breath sounds.  Chest:     Breasts:        Right: Normal. No swelling, bleeding, inverted nipple, mass, nipple discharge, skin change or tenderness.        Left: Normal. No swelling, bleeding, inverted nipple, mass, nipple discharge, skin change or tenderness.  Abdominal:     General: Bowel sounds are normal.     Palpations: Abdomen is soft. There is no hepatomegaly or splenomegaly.     Tenderness: There is no abdominal tenderness.  Musculoskeletal: Normal range of motion.     Right lower leg: No edema.     Left lower leg: No edema.  Lymphadenopathy:     Head:     Right side of head: No submental, submandibular, tonsillar, preauricular or posterior auricular adenopathy.     Left side of head: No submental, submandibular, tonsillar, preauricular or posterior auricular adenopathy.     Cervical: No cervical adenopathy.     Upper Body:     Right upper body: No supraclavicular, axillary or pectoral adenopathy.  Left upper body: No supraclavicular, axillary or pectoral adenopathy.  Skin:    General: Skin is warm and dry.     Capillary Refill: Capillary refill takes less than 2 seconds.     Findings: No rash.  Neurological:     Mental Status: She is alert and oriented to person, place, and time.     Cranial Nerves: Cranial nerves are intact.     Gait: Gait is intact.     Deep Tendon Reflexes: Reflexes are normal and symmetric.     Reflex Scores:      Brachioradialis  reflexes are 2+ on the right side and 2+ on the left side.      Patellar reflexes are 2+ on the right side and 2+ on the left side. Psychiatric:        Attention and Perception: Attention normal.        Mood and Affect: Mood normal.        Speech: Speech normal.        Behavior: Behavior normal. Behavior is cooperative.        Thought Content: Thought content normal.        Judgment: Judgment normal.     Results for orders placed or performed in visit on 08/19/18  CBC with Differential/Platelet  Result Value Ref Range   WBC 7.6 3.4 - 10.8 x10E3/uL   RBC 4.94 3.77 - 5.28 x10E6/uL   Hemoglobin 14.1 11.1 - 15.9 g/dL   Hematocrit 41.0 34.0 - 46.6 %   MCV 83 79 - 97 fL   MCH 28.5 26.6 - 33.0 pg   MCHC 34.4 31.5 - 35.7 g/dL   RDW 13.4 11.7 - 15.4 %   Platelets 404 150 - 450 x10E3/uL   Neutrophils 70 Not Estab. %   Lymphs 19 Not Estab. %   Monocytes 8 Not Estab. %   Eos 2 Not Estab. %   Basos 1 Not Estab. %   Neutrophils Absolute 5.2 1.4 - 7.0 x10E3/uL   Lymphocytes Absolute 1.4 0.7 - 3.1 x10E3/uL   Monocytes Absolute 0.6 0.1 - 0.9 x10E3/uL   EOS (ABSOLUTE) 0.2 0.0 - 0.4 x10E3/uL   Basophils Absolute 0.0 0.0 - 0.2 x10E3/uL   Immature Granulocytes 0 Not Estab. %   Immature Grans (Abs) 0.0 0.0 - 0.1 x10E3/uL  IgG, IgA, IgM  Result Value Ref Range   IgG (Immunoglobin G), Serum 988 586 - 1,602 mg/dL   IgA/Immunoglobulin A, Serum 109 87 - 352 mg/dL   IgM (Immunoglobulin M), Srm 123 26 - 217 mg/dL  VITAMIN D 25 Hydroxy (Vit-D Deficiency, Fractures)  Result Value Ref Range   Vit D, 25-Hydroxy 29.5 (L) 30.0 - 100.0 ng/mL      Assessment & Plan:   Problem List Items Addressed This Visit      Nervous and Auditory   Relapsing remitting multiple sclerosis (HCC)   Relevant Orders   CBC with Differential/Platelet   Comprehensive metabolic panel    Other Visit Diagnoses    Flu vaccine need    -  Primary   Relevant Orders   Flu Vaccine QUAD 36+ mos IM (Completed)   Vitamin D  deficiency       Relevant Orders   VITAMIN D 25 Hydroxy (Vit-D Deficiency, Fractures)   Annual physical exam       Relevant Orders   Comprehensive metabolic panel   Lipid Panel w/o Chol/HDL Ratio   TSH   MM DIGITAL SCREENING BILATERAL  Follow up plan: Return in about 6 months (around 06/04/2019) for MS and Mood.   LABORATORY TESTING:  - Pap smear: up to date  IMMUNIZATIONS:   - Tdap: Tetanus vaccination status reviewed: last tetanus booster within 10 years. - Influenza: Up to date - Pneumovax: Not applicable - Prevnar: Not applicable - HPV: Not applicable - Zostavax vaccine: Not applicable  SCREENING: -Mammogram: Ordered today  - Colonoscopy: Not applicable  - Bone Density: Not applicable  -Hearing Test: Not applicable  -Spirometry: Not applicable   PATIENT COUNSELING:   Advised to take 1 mg of folate supplement per day if capable of pregnancy.   Sexuality: Discussed sexually transmitted diseases, partner selection, use of condoms, avoidance of unintended pregnancy  and contraceptive alternatives.   Advised to avoid cigarette smoking.  I discussed with the patient that most people either abstain from alcohol or drink within safe limits (<=14/week and <=4 drinks/occasion for males, <=7/weeks and <= 3 drinks/occasion for females) and that the risk for alcohol disorders and other health effects rises proportionally with the number of drinks per week and how often a drinker exceeds daily limits.  Discussed cessation/primary prevention of drug use and availability of treatment for abuse.   Diet: Encouraged to adjust caloric intake to maintain  or achieve ideal body weight, to reduce intake of dietary saturated fat and total fat, to limit sodium intake by avoiding high sodium foods and not adding table salt, and to maintain adequate dietary potassium and calcium preferably from fresh fruits, vegetables, and low-fat dairy products.    stressed the importance of regular  exercise  Injury prevention: Discussed safety belts, safety helmets, smoke detector, smoking near bedding or upholstery.   Dental health: Discussed importance of regular tooth brushing, flossing, and dental visits.    NEXT PREVENTATIVE PHYSICAL DUE IN 1 YEAR. Return in about 6 months (around 06/04/2019) for MS and Mood.

## 2018-12-05 LAB — COMPREHENSIVE METABOLIC PANEL
ALT: 9 IU/L (ref 0–32)
AST: 11 IU/L (ref 0–40)
Albumin/Globulin Ratio: 2 (ref 1.2–2.2)
Albumin: 4.2 g/dL (ref 3.8–4.8)
Alkaline Phosphatase: 106 IU/L (ref 39–117)
BUN/Creatinine Ratio: 13 (ref 9–23)
BUN: 10 mg/dL (ref 6–24)
Bilirubin Total: <0.2 mg/dL (ref 0.0–1.2)
CO2: 22 mmol/L (ref 20–29)
Calcium: 8.9 mg/dL (ref 8.7–10.2)
Chloride: 105 mmol/L (ref 96–106)
Creatinine, Ser: 0.77 mg/dL (ref 0.57–1.00)
GFR calc Af Amer: 112 mL/min/{1.73_m2} (ref >59)
GFR calc non Af Amer: 97 mL/min/{1.73_m2} (ref >59)
Globulin, Total: 2.1 g/dL (ref 1.5–4.5)
Glucose: 58 mg/dL — ABNORMAL LOW (ref 65–99)
Potassium: 3.7 mmol/L (ref 3.5–5.2)
Sodium: 140 mmol/L (ref 134–144)
Total Protein: 6.3 g/dL (ref 6.0–8.5)

## 2018-12-05 LAB — CBC WITH DIFFERENTIAL/PLATELET
Basophils Absolute: 0 10*3/uL (ref 0.0–0.2)
Basos: 1 %
EOS (ABSOLUTE): 0.2 10*3/uL (ref 0.0–0.4)
Eos: 3 %
Hematocrit: 40.2 % (ref 34.0–46.6)
Hemoglobin: 13.5 g/dL (ref 11.1–15.9)
Immature Grans (Abs): 0 10*3/uL (ref 0.0–0.1)
Immature Granulocytes: 0 %
Lymphocytes Absolute: 1 10*3/uL (ref 0.7–3.1)
Lymphs: 14 %
MCH: 28.8 pg (ref 26.6–33.0)
MCHC: 33.6 g/dL (ref 31.5–35.7)
MCV: 86 fL (ref 79–97)
Monocytes Absolute: 0.5 10*3/uL (ref 0.1–0.9)
Monocytes: 7 %
Neutrophils Absolute: 5.4 10*3/uL (ref 1.4–7.0)
Neutrophils: 75 %
Platelets: 348 10*3/uL (ref 150–450)
RBC: 4.68 x10E6/uL (ref 3.77–5.28)
RDW: 13.2 % (ref 11.7–15.4)
WBC: 7.1 10*3/uL (ref 3.4–10.8)

## 2018-12-05 LAB — TSH: TSH: 1.12 u[IU]/mL (ref 0.450–4.500)

## 2018-12-05 LAB — LIPID PANEL W/O CHOL/HDL RATIO
Cholesterol, Total: 152 mg/dL (ref 100–199)
HDL: 52 mg/dL (ref >39)
LDL Chol Calc (NIH): 83 mg/dL (ref 0–99)
Triglycerides: 94 mg/dL (ref 0–149)
VLDL Cholesterol Cal: 17 mg/dL (ref 5–40)

## 2018-12-05 LAB — VITAMIN D 25 HYDROXY (VIT D DEFICIENCY, FRACTURES): Vit D, 25-Hydroxy: 46 ng/mL (ref 30.0–100.0)

## 2018-12-22 ENCOUNTER — Ambulatory Visit (INDEPENDENT_AMBULATORY_CARE_PROVIDER_SITE_OTHER): Payer: PRIVATE HEALTH INSURANCE | Admitting: Psychiatry

## 2018-12-22 ENCOUNTER — Other Ambulatory Visit: Payer: Self-pay

## 2018-12-22 ENCOUNTER — Encounter: Payer: Self-pay | Admitting: Psychiatry

## 2018-12-22 DIAGNOSIS — F411 Generalized anxiety disorder: Secondary | ICD-10-CM

## 2018-12-22 DIAGNOSIS — G4700 Insomnia, unspecified: Secondary | ICD-10-CM

## 2018-12-22 DIAGNOSIS — F0632 Mood disorder due to known physiological condition with major depressive-like episode: Secondary | ICD-10-CM

## 2018-12-22 MED ORDER — BUPROPION HCL ER (XL) 150 MG PO TB24: 150.0000 mg | ORAL_TABLET | Freq: Every day | ORAL | 0 refills | Status: DC

## 2018-12-22 NOTE — Progress Notes (Signed)
Virtual Visit via Video Note  I connected with [name] on 12/22/18 at 10:15 AM EST by a video enabled telemedicine application and verified that I am speaking with the correct person using two identifiers.   I discussed the limitations of evaluation and management by telemedicine and the availability of in person appointments. The patient expressed understanding and agreed to proceed.    I discussed the assessment and treatment plan with the patient. The patient was provided an opportunity to ask questions and all were answered. The patient agreed with the plan and demonstrated an understanding of the instructions.   The patient was advised to call back or seek an in-person evaluation if the symptoms worsen or if the condition fails to improve as anticipated.   Hettinger MD OP Progress Note  12/22/2018 12:51 PM [name]  MRN:  [id number]  Chief Complaint:  Chief Complaint    Follow-up     HPI: [name] is a 38 year old Caucasian female who has a history of depression secondary to medical problems, GAD, insomnia, optic neuritis, multiple sclerosis, migraine headaches was evaluated by telemedicine today.  Patient today reports she ran out of her Wellbutrin.  She did not pick up the prescription which was sent to the pharmacy and of October.  She reports she had a lot going on situationally and hence got overwhelmed.  She reports she is currently having trouble with her son's performance at school.  Both her children struggle with mental health problems.  She is currently trying the best she can to support them.  She however reports that it gets stressful often.  She has been feeling anxious as well as depressed recently.  It may also be because she ran out of her medications.  Patient reports sleep is good.  She is compliant on the trazodone which helps.  Patient denies any suicidality, homicidality or perceptual disturbances.  She has been working with her therapist Ms. Alden Hipp.  Patient denies any other concerns today. Visit Diagnosis:    ICD-10-CM   1. 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 GAD (generalized anxiety disorder)  F41.1   3. Insomnia, unspecified type  G47.00     Past Psychiatric History: I have reviewed past psychiatric history from my progress note on 05/21/2018.  Past trials of Zoloft, Adderall, trazodone.  Past Medical History:  Past Medical History:  Diagnosis Date  . Depression   . MS (multiple sclerosis) ([place])   . Multiple sclerosis ([place])    History reviewed. No pertinent surgical history.  Family Psychiatric History: Reviewed family psychiatric history from my progress note on 05/21/2018.  Family History:  Family History  Problem Relation Age of Onset  . Multiple myeloma Mother   . Multiple sclerosis Mother   . Obesity Sister   . Anxiety disorder Daughter   . Multiple sclerosis Maternal Aunt     Social History: Reviewed social history from my progress note on 05/21/2018. Social History   Socioeconomic History  . Marital status: Married    Spouse name: steven ambrose  . Number of children: 3  . Years of education: Not on file  . Highest education level: Bachelor's degree (e.g., BA, AB, BS)  Occupational History  . Not on file  Social Needs  . Financial resource strain: Not hard at all  . Food insecurity    Worry: Never true    Inability: Never true  .  Transportation needs    Medical: No    Non-medical: No  Tobacco Use  . Smoking status: Never Smoker  . Smokeless tobacco: Never Used  Substance and Sexual Activity  . Alcohol use: Not Currently    Alcohol/week: 0.0 standard drinks    Comment: occasional/fim  . Drug use: No  . Sexual activity: Yes    Birth control/protection: I.U.D.  Lifestyle  . Physical activity    Days per week: 0 days    Minutes per session: 0 min  . Stress: Only a little   Relationships  . Social Herbalist on phone: Not on file    Gets together: Not on file    Attends religious service: More than 4 times per year    Active member of club or organization: No    Attends meetings of clubs or organizations: Never    Relationship status: Married  Other Topics Concern  . Not on file  Social History Narrative  . Not on file    Allergies: No Known Allergies  Metabolic Disorder Labs: No results found for: HGBA1C, MPG No results found for: PROLACTIN Lab Results  Component Value Date   CHOL 152 12/04/2018   TRIG 94 12/04/2018   HDL 52 12/04/2018   CHOLHDL 2.6 12/02/2017   LDLCALC 83 12/04/2018   LDLCALC 63 12/02/2017   Lab Results  Component Value Date   TSH 1.120 12/04/2018   TSH 1.240 09/08/2017    Therapeutic Level Labs: No results found for: LITHIUM No results found for: VALPROATE No components found for:  CBMZ  Current Medications: Current Outpatient Medications  Medication Sig Dispense Refill  . amphetamine-dextroamphetamine (ADDERALL XR) 30 MG 24 hr capsule Take 1 capsule (30 mg total) by mouth daily. 30 capsule 0  . baclofen (LIORESAL) 10 MG tablet TAKE 1 TABLET BY MOUTH THREE TIMES A DAY AS NEEDED FOR MUSCLE SPASMS 270 tablet 3  . Cholecalciferol (VITAMIN D-1000 MAX ST) 1000 units tablet Take 5,000 Units by mouth daily.     Marland Kitchen dalfampridine 10 MG TB12 One po q12 hours 60 tablet 11  . fluticasone (FLONASE) 50 MCG/ACT nasal spray SPRAY 2 SPRAYS INTO EACH NOSTRIL EVERY DAY    . ocrelizumab 600 mg in sodium chloride 0.9 % 500 mL Inject 600 mg into the vein every 6 (six) months.    . sertraline (ZOLOFT) 100 MG tablet TAKE 2 TABLETS BY MOUTH EVERY DAY 180 tablet 2  . topiramate (TOPAMAX) 100 MG tablet TAKE 1 TABLET BY MOUTH EVERYDAY AT BEDTIME 90 tablet 3  . traZODone (DESYREL) 50 MG tablet TAKE 1.5 TABLETS (75 MG TOTAL) BY MOUTH AT BEDTIME. 135 tablet 0  . Vitamin D, Ergocalciferol, (DRISDOL) 1.25 MG (50000 UT) CAPS capsule Take  1 capsule (50,000 Units total) by mouth every 7 (seven) days. Take 1 capsule weekly for 26 weeks and then go to 5000U OTC daily after 26 capsule 0  . amphetamine-dextroamphetamine (ADDERALL) 10 MG tablet Take one or two po in the aftrernoon (Patient not taking: Reported on 12/22/2018) 60 tablet 0  . buPROPion (WELLBUTRIN XL) 150 MG 24 hr tablet Take 1 tablet (150 mg total) by mouth daily. 90 tablet 0   No current facility-administered medications for this visit.      Musculoskeletal: Strength & Muscle Tone: uta Gait & Station: normal Patient leans: N/A  Psychiatric Specialty Exam: Review of Systems  Psychiatric/Behavioral: Positive for depression.  All other systems reviewed and are negative.   There were no vitals  taken for this visit.There is no height or weight on file to calculate BMI.  General Appearance: Casual  Eye Contact:  Fair  Speech:  Clear and Coherent  Volume:  Normal  Mood:  Depressed  Affect:  Congruent  Thought Process:  Goal Directed and Descriptions of Associations: Intact  Orientation:  Full (Time, Place, and Person)  Thought Content: Logical   Suicidal Thoughts:  No  Homicidal Thoughts:  No  Memory:  Immediate;   Fair Recent;   Fair Remote;   Fair  Judgement:  Fair  Insight:  Fair  Psychomotor Activity:  Normal  Concentration:  Concentration: Fair and Attention Span: Fair  Recall:  [employer] of Knowledge: Fair  Language: Fair  Akathisia:  No  Handed:  Right  AIMS (if indicated): denies tremors, rigidity  Assets:  Communication Skills Desire for Improvement Housing Social Support  ADL's:  Intact  Cognition: WNL  Sleep:  Fair   Screenings: GAD-7     Office Visit from 12/04/2018 in [place] Visit from 06/03/2018 in [place] Visit from 04/03/2018 in [place] Visit from 12/02/2017 in [place]  Total GAD-7 Score  11  5  16  2     PHQ2-9     Office Visit from  12/04/2018 in [place] Visit from 06/03/2018 in [place] Visit from 04/03/2018 in [place] Visit from 12/02/2017 in [place] Visit from 11/18/2017 in [place]  PHQ-2 Total Score  3  2  5  2   0  PHQ-9 Total Score  15  8  18  9  8        Assessment and Plan: [name] is a 38 year old Caucasian female, married, employed, lives in [place], has a history of depression, anxiety, MS, fatigue, insomnia was evaluated by telemedicine today.  Patient with biological predisposition given her family history as well as her own multiple sclerosis diagnosis.  She also has psychosocial stressors of chronic health issues, children's mental health and academic problems.  Patient will continue to benefit from medication readjustment as well as psychotherapy sessions.  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 daily Zoloft 200 mg p.o. daily  Insomnia-improving Trazodone 75 mg p.o. nightly  GAD-improving Zoloft as prescribed Continue CBT  Follow-up in clinic in 4 weeks or sooner if needed.  December 14 at 11 AM  I have spent atleast 15 minutes non face to face with patient today. More than 50 % of the time was spent for psychoeducation and supportive psychotherapy and care coordination. This note was generated in part or whole with voice recognition software. Voice recognition is usually quite accurate but there are transcription errors that can and very often do occur. I apologize for any typographical errors that were not detected and corrected.       Ursula Alert, MD 12/22/2018, 12:51 PM

## 2018-12-29 ENCOUNTER — Encounter: Payer: Self-pay | Admitting: Licensed Clinical Social Worker

## 2018-12-29 ENCOUNTER — Ambulatory Visit (INDEPENDENT_AMBULATORY_CARE_PROVIDER_SITE_OTHER): Payer: PRIVATE HEALTH INSURANCE | Admitting: Licensed Clinical Social Worker

## 2018-12-29 ENCOUNTER — Other Ambulatory Visit: Payer: Self-pay

## 2018-12-29 DIAGNOSIS — F0632 Mood disorder due to known physiological condition with major depressive-like episode: Secondary | ICD-10-CM | POA: Diagnosis not present

## 2018-12-29 DIAGNOSIS — F411 Generalized anxiety disorder: Secondary | ICD-10-CM

## 2018-12-29 NOTE — Progress Notes (Signed)
  Virtual Visit via Telephone Note  I connected with [name] on 12/29/18 at  9:00 AM EST by telephone and verified that I am speaking with the correct person using two identifiers.   I discussed the limitations, risks, security and privacy concerns of performing an evaluation and management service by telephone and the availability of in person appointments. I also discussed with the patient that there may be a patient responsible charge related to this service. The patient expressed understanding and agreed to proceed.    I discussed the assessment and treatment plan with the patient. The patient was provided an opportunity to ask questions and all were answered. The patient agreed with the plan and demonstrated an understanding of the instructions.   The patient was advised to call back or seek an in-person evaluation if the symptoms worsen or if the condition fails to improve as anticipated.  I provided 60 minutes of non-face-to-face time during this encounter.   Alden Hipp, LCSW   THERAPIST PROGRESS NOTE  Session Time: 0900  Participation Level: Active  Behavioral Response: NeatAlertAnxious and Depressed  Type of Therapy: Individual Therapy  Treatment Goals addressed: Anxiety  Interventions: CBT  Summary: ISLAND DOHMEN is a 38 y.o. female who presents with continued symptoms related to her diagnosis. Dylan reports doing well since our last session, but noted she has felt overwhelmed with everything she has to do in her life. Orlandria reported her son, Shanon Brow, has gone to live with his father currently which has made [facility] feel like a failure. LCSW encouraged Louvinia to challenge those negative thoughts by utilizing CBT skills, as this is a reflection of her son's motivation and engagement, not a reflection of her as a mother. We discussed that, likely, her son will fall into the same habits at his father's home, and his father will then understand what Roizy has been  attempting to deal with. Amariyah expressed understanding and agreement. Kyani reported feeling overwhelmed by her daughter, and her daughter's lack of participation in school. Rakiyah stated she worries she is enabling her bad behavior by not being more strict. LCSW highlighted that Tyja is concerned with her daughter's emotional wellbeing, which is incredibly important--and is not enabling. Michille was able to recognize this as well and expressed agreement. We reviewed ways to manage negative thoughts, utilize CBT skills, and how to improve positive self talk moving forward.   Suicidal/Homicidal: No   Therapist Response: Zerina continues to work towards her tx goals but has not yet reached them. We will continue to work on emotional regulation skills and improving CBT skills moving forward.   Plan: Return again in 2 weeks.  Diagnosis: Axis I: Generalized Anxiety Disorder    Axis II: No diagnosis    Alden Hipp, LCSW 12/29/2018

## 2019-01-04 ENCOUNTER — Other Ambulatory Visit: Payer: Self-pay

## 2019-01-05 MED ORDER — AMPHETAMINE-DEXTROAMPHET ER 30 MG PO CP24: 30.0000 mg | ORAL_CAPSULE | Freq: Every day | ORAL | 0 refills | Status: DC

## 2019-01-12 ENCOUNTER — Encounter: Payer: Self-pay | Admitting: Licensed Clinical Social Worker

## 2019-01-12 ENCOUNTER — Ambulatory Visit (INDEPENDENT_AMBULATORY_CARE_PROVIDER_SITE_OTHER): Payer: PRIVATE HEALTH INSURANCE | Admitting: Licensed Clinical Social Worker

## 2019-01-12 ENCOUNTER — Other Ambulatory Visit: Payer: Self-pay

## 2019-01-12 DIAGNOSIS — F411 Generalized anxiety disorder: Secondary | ICD-10-CM

## 2019-01-12 NOTE — Progress Notes (Signed)
Virtual Visit via Video Note  I connected with [name] on 01/12/19 at  9:00 AM EST by a video enabled telemedicine application and verified that I am speaking with the correct person using two identifiers.   I discussed the limitations of evaluation and management by telemedicine and the availability of in person appointments. The patient expressed understanding and agreed to proceed.    I discussed the assessment and treatment plan with the patient. The patient was provided an opportunity to ask questions and all were answered. The patient agreed with the plan and demonstrated an understanding of the instructions.   The patient was advised to call back or seek an in-person evaluation if the symptoms worsen or if the condition fails to improve as anticipated.  I provided 60 minutes of non-face-to-face time during this encounter.   Alden Hipp, LCSW   THERAPIST PROGRESS NOTE  Session Time: 0900  Participation Level: Active  Behavioral Response: CasualAlertDepressed  Type of Therapy: Individual Therapy  Treatment Goals addressed: Anxiety  Interventions: Supportive  Summary: [name] is a 38 y.o. female who presents with continued symptoms related to her diagnosis. [name] reports doing better since our last session, but noted she is still struggling emotionally. [name] reported feeling she has gotten herself into a deep hole financially, and not knowing how to get out of it. At dinner one night, her in-laws gifted she and her husband 1000 dollars, which [name] was excited about as it would assist in reducing her debt. However, her husband had other plans for the money and became upset when she voiced her feelings. LCSW encouraged [name] to recognize it is difficult for her and her husband to live as though they are married when they do not live together full time. We discussed ways they could get on the same page about various things within their marriage until they are  able to move in together full time. [name] expressed understanding and agreement, and reported she feels they might need to do therapy. LCSW suggested a marriage counseling app that is free for the first month, Lasting, and encouraged [name] to bring that up to her husband. [name] went on to discuss problems within her relationship with her ex-husband and how he chooses to parent their daughter. LCSW held space for [name] to discuss these issues, and how each situation made her feel. LCSW validated [name]'s feelings and encouraged her to recognize her requests and feelings are reasonable, and she is not expecting too much. [name] expressed agreement with this idea, "it's just good to hear because sometimes I feel like it is."   Suicidal/Homicidal: No   Therapist Response: [name] continues to work towards her tx goals but has not yet reached them. We will continue to work on improving emotional regulation and distress tolerance skills moving forward to manage anxiety and depression symptoms.   Plan: Return again in 2 weeks.  Diagnosis: Axis I: Generalized Anxiety Disorder    Axis II: No diagnosis    Alden Hipp, LCSW 01/12/2019

## 2019-01-18 ENCOUNTER — Ambulatory Visit (INDEPENDENT_AMBULATORY_CARE_PROVIDER_SITE_OTHER): Payer: PRIVATE HEALTH INSURANCE | Admitting: Psychiatry

## 2019-01-18 ENCOUNTER — Other Ambulatory Visit: Payer: Self-pay

## 2019-01-18 ENCOUNTER — Encounter: Payer: Self-pay | Admitting: Psychiatry

## 2019-01-18 DIAGNOSIS — F411 Generalized anxiety disorder: Secondary | ICD-10-CM | POA: Diagnosis not present

## 2019-01-18 DIAGNOSIS — F0632 Mood disorder due to known physiological condition with major depressive-like episode: Secondary | ICD-10-CM

## 2019-01-18 DIAGNOSIS — G4700 Insomnia, unspecified: Secondary | ICD-10-CM

## 2019-01-18 NOTE — Progress Notes (Signed)
Virtual Visit via Video Note  I connected with [name] on 01/18/19 at 11:00 AM EST by a video enabled telemedicine application and verified that I am speaking with the correct person using two identifiers.   I discussed the limitations of evaluation and management by telemedicine and the availability of in person appointments. The patient expressed understanding and agreed to proceed.     I discussed the assessment and treatment plan with the patient. The patient was provided an opportunity to ask questions and all were answered. The patient agreed with the plan and demonstrated an understanding of the instructions.   The patient was advised to call back or seek an in-person evaluation if the symptoms worsen or if the condition fails to improve as anticipated.   [place] MD OP Progress Note  01/18/2019 12:06 PM [name]  MRN:  [id number]  Chief Complaint:  Chief Complaint    Follow-up     HPI: [name] is a 38 year old Caucasian female who has a history of depression, GAD, insomnia, optic neuritis, multiple sclerosis, migraine headaches, was evaluated by telemedicine today.  Patient today reports she is currently struggling with anxiety .  Because of her situational process.  She reports her son is having trouble at school.  That does make her anxious.  She however reports he currently lives with his dad and he is making some progress and that is a relief for her.  She reports otherwise she is tolerating the Wellbutrin higher dosage well.  She denies any significant depressive symptoms.  She reports sleep is good.  Patient continues to work with her therapist Ms. Alden Hipp.  She reports therapy sessions is going well.  Patient denies any suicidality, homicidality or perceptual disturbances.  Patient denies any other concerns today. Visit Diagnosis:    ICD-10-CM   1. 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 GAD (generalized anxiety disorder)  F41.1   3. Insomnia, unspecified type  G47.00     Past Psychiatric History: I have reviewed past psychiatric history from my progress note on 05/21/2018.  Past trials of Zoloft, Adderall, trazodone.  Past Medical History:  Past Medical History:  Diagnosis Date  . Depression   . MS (multiple sclerosis) ([place])   . Multiple sclerosis ([place])    History reviewed. No pertinent surgical history.  Family Psychiatric History: Reviewed family psychiatric history from my progress note on 05/21/2018.  Family History:  Family History  Problem Relation Age of Onset  . Multiple myeloma Mother   . Multiple sclerosis Mother   . Obesity Sister   . Anxiety disorder Daughter   . Multiple sclerosis Maternal Aunt     Social History: Reviewed social history from my progress note on 05/21/2018. Social History   Socioeconomic History  . Marital status: Married    Spouse name: steven ambrose  . Number of children: 3  . Years of education: Not on file  . Highest education level: Bachelor's degree (e.g., BA, AB, BS)  Occupational History  . Not on file  Tobacco Use  . Smoking status: Never Smoker  . Smokeless tobacco: Never Used  Substance and Sexual Activity  . Alcohol use: Not Currently    Alcohol/week: 0.0 standard drinks    Comment: occasional/fim  . Drug use: No  . Sexual activity: Yes    Birth control/protection: I.U.D.  Other Topics Concern  . Not on file  Social History Narrative  .  Not on file   Social Determinants of Health   Financial Resource Strain: Low Risk   . Difficulty of Paying Living Expenses: Not hard at all  Food Insecurity: No Food Insecurity  . Worried About Charity fundraiser in the Last Year: Never true  . Ran Out of Food in the Last Year: Never true  Transportation Needs: No Transportation Needs  . Lack of Transportation (Medical): No  . Lack of Transportation (Non-Medical): No  Physical Activity:  Inactive  . Days of Exercise per Week: 0 days  . Minutes of Exercise per Session: 0 min  Stress: No Stress Concern Present  . Feeling of Stress : Only a little  Social Connections: Unknown  . Frequency of Communication with Friends and Family: Not on file  . Frequency of Social Gatherings with Friends and Family: Not on file  . Attends Religious Services: More than 4 times per year  . Active Member of Clubs or Organizations: No  . Attends Archivist Meetings: Never  . Marital Status: Married    Allergies: No Known Allergies  Metabolic Disorder Labs: No results found for: HGBA1C, MPG No results found for: PROLACTIN Lab Results  Component Value Date   CHOL 152 12/04/2018   TRIG 94 12/04/2018   HDL 52 12/04/2018   CHOLHDL 2.6 12/02/2017   LDLCALC 83 12/04/2018   LDLCALC 63 12/02/2017   Lab Results  Component Value Date   TSH 1.120 12/04/2018   TSH 1.240 09/08/2017    Therapeutic Level Labs: No results found for: LITHIUM No results found for: VALPROATE No components found for:  CBMZ  Current Medications: Current Outpatient Medications  Medication Sig Dispense Refill  . amphetamine-dextroamphetamine (ADDERALL XR) 30 MG 24 hr capsule Take 1 capsule (30 mg total) by mouth daily. 30 capsule 0  . amphetamine-dextroamphetamine (ADDERALL) 10 MG tablet Take one or two po in the aftrernoon (Patient not taking: Reported on 12/22/2018) 60 tablet 0  . baclofen (LIORESAL) 10 MG tablet TAKE 1 TABLET BY MOUTH THREE TIMES A DAY AS NEEDED FOR MUSCLE SPASMS 270 tablet 3  . buPROPion (WELLBUTRIN XL) 150 MG 24 hr tablet Take 1 tablet (150 mg total) by mouth daily. 90 tablet 0  . Cholecalciferol (VITAMIN D-1000 MAX ST) 1000 units tablet Take 5,000 Units by mouth daily.     Marland Kitchen dalfampridine 10 MG TB12 One po q12 hours 60 tablet 11  . fluticasone (FLONASE) 50 MCG/ACT nasal spray SPRAY 2 SPRAYS INTO EACH NOSTRIL EVERY DAY    . ocrelizumab 600 mg in sodium chloride 0.9 % 500 mL Inject  600 mg into the vein every 6 (six) months.    . sertraline (ZOLOFT) 100 MG tablet TAKE 2 TABLETS BY MOUTH EVERY DAY 180 tablet 2  . topiramate (TOPAMAX) 100 MG tablet TAKE 1 TABLET BY MOUTH EVERYDAY AT BEDTIME 90 tablet 3  . traZODone (DESYREL) 50 MG tablet TAKE 1.5 TABLETS (75 MG TOTAL) BY MOUTH AT BEDTIME. 135 tablet 0  . Vitamin D, Ergocalciferol, (DRISDOL) 1.25 MG (50000 UT) CAPS capsule Take 1 capsule (50,000 Units total) by mouth every 7 (seven) days. Take 1 capsule weekly for 26 weeks and then go to 5000U OTC daily after 26 capsule 0   No current facility-administered medications for this visit.     Musculoskeletal: Strength & Muscle Tone: UTA Gait & Station: normal Patient leans: N/A  Psychiatric Specialty Exam: Review of Systems  HENT: Positive for sinus pain.   Neurological: Positive for headaches.  Psychiatric/Behavioral:  The patient is nervous/anxious.   All other systems reviewed and are negative.   There were no vitals taken for this visit.There is no height or weight on file to calculate BMI.  General Appearance: Casual  Eye Contact:  Fair  Speech:  Normal Rate  Volume:  Normal  Mood:  Anxious  Affect:  Congruent  Thought Process:  Goal Directed and Descriptions of Associations: Intact  Orientation:  Full (Time, Place, and Person)  Thought Content: Logical   Suicidal Thoughts:  No  Homicidal Thoughts:  No  Memory:  Immediate;   Fair Recent;   Fair Remote;   Fair  Judgement:  Fair  Insight:  Fair  Psychomotor Activity:  Normal  Concentration:  Concentration: Fair and Attention Span: Fair  Recall:  [employer] of Knowledge: Fair  Language: Fair  Akathisia:  No  Handed:  Right  AIMS (if indicated): Denies tremors, rigidity  Assets:  Communication Skills Desire for Improvement Housing Social Support  ADL's:  Intact  Cognition: WNL  Sleep:  Fair   Screenings: GAD-7     Office Visit from 12/04/2018 in [facility] Visit from  06/03/2018 in [facility] Visit from 04/03/2018 in [place] Visit from 12/02/2017 in [place]  Total GAD-7 Score  11  5  16  2     PHQ2-9     Office Visit from 12/04/2018 in [place] Visit from 06/03/2018 in [facility] Visit from 04/03/2018 in [facility] Visit from 12/02/2017 in [facility] Visit from 11/18/2017 in [facility]  PHQ-2 Total Score  3  2  5  2   0  PHQ-9 Total Score  15  8  18  9  8        Assessment and Plan: Jamekia is a 38 year old Caucasian female, married, employed, lives in [place], has a history of depression, anxiety, MS, fatigue, insomnia was evaluated by telemedicine today.  Patient with biological predisposition given her family history as well as multiple sclerosis diagnosis.  She also has psychosocial stressors of chronic health problems, children's mental health and academic problems.  She will benefit from continued medication management as well as psychotherapy sessions.  Plan as noted below.  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 daily Zoloft 200 mg p.o. daily  Insomnia-stable Trazodone 75 mg p.o. nightly  GAD-improving Zoloft as prescribed Continue CBT with Ms. Cecilie Lowers.  Follow-up in clinic in 6 weeks or sooner if needed.  January 29 at 8:30 AM  I have spent atleast 15 minutes non  face to face with patient today. More than 50 % of the time was spent for psychoeducation and supportive psychotherapy and care coordination. This note was generated in part or whole with voice recognition software. Voice recognition is usually quite accurate but there are transcription errors that can and very often do occur. I apologize for any typographical errors that were not detected and corrected.       Ursula Alert, MD 01/18/2019, 12:06 PM

## 2019-01-25 ENCOUNTER — Other Ambulatory Visit: Payer: Self-pay | Admitting: Neurology

## 2019-01-25 MED ORDER — AMPHETAMINE-DEXTROAMPHET ER 30 MG PO CP24: 30.0000 mg | ORAL_CAPSULE | Freq: Every day | ORAL | 0 refills | Status: DC

## 2019-01-26 ENCOUNTER — Other Ambulatory Visit: Payer: Self-pay

## 2019-01-26 ENCOUNTER — Encounter: Payer: Self-pay | Admitting: Licensed Clinical Social Worker

## 2019-01-26 ENCOUNTER — Ambulatory Visit (INDEPENDENT_AMBULATORY_CARE_PROVIDER_SITE_OTHER): Payer: PRIVATE HEALTH INSURANCE | Admitting: Licensed Clinical Social Worker

## 2019-01-26 DIAGNOSIS — F0632 Mood disorder due to known physiological condition with major depressive-like episode: Secondary | ICD-10-CM

## 2019-01-26 DIAGNOSIS — F411 Generalized anxiety disorder: Secondary | ICD-10-CM

## 2019-01-26 NOTE — Progress Notes (Signed)
Virtual Visit via Video Note  I connected with [name] on 01/26/19 at  9:00 AM EST by a video enabled telemedicine application and verified that I am speaking with the correct person using two identifiers.   I discussed the limitations of evaluation and management by telemedicine and the availability of in person appointments. The patient expressed understanding and agreed to proceed.  I discussed the assessment and treatment plan with the patient. The patient was provided an opportunity to ask questions and all were answered. The patient agreed with the plan and demonstrated an understanding of the instructions.   The patient was advised to call back or seek an in-person evaluation if the symptoms worsen or if the condition fails to improve as anticipated.  I provided 60 minutes of non-face-to-face time during this encounter.   Alden Hipp, LCSW    THERAPIST PROGRESS NOTE  Session Time: 0900  Participation Level: Active  Behavioral Response: NeatAlertAnxious  Type of Therapy: Individual Therapy  Treatment Goals addressed: Anxiety  Interventions: Supportive  Summary: [name] is a 38 y.o. female who presents with continued symptoms related to her diagnosis. Arian reports doing well since our last session. She reports her son's grades have improved since he moved in with his father, and that has her wondering about her daughter. She reports her daughter is not doing well at all in school and her therapist has instructed [name] that her daughter needs more structure. Tersea reports struggling with this as she is working, trying to stay on top of their school work, and trying to be a care giver all at the same time. LCSW validated [name]'s feelings and encouraged her to recognize she cannot do everything for everyone. We discussed how her daughter is old enough that [name] should not have to be on her at all times to get her to do the things she says she will do. [name]  expressed understanding and agreement. LCSW held space for [name] to discuss other frustrations as they related to her daughter and other children, and feeling helpless in the situation. LCSW encouraged [name] to recognize she is doing better than she feels she is, and to utilize coping skills we've discussed previously to manage symptoms in the moment.  We also reviewed how to utilize assertive communication moving forward. [name] expressed understanding and agreement with this information as well.    Suicidal/Homicidal: No  Therapist Response: [name] continues to work towards her tx goals but has not yet reached them. We will continue to work on improving communication and distress tolerance moving forward.   Plan: Return again in 2 weeks.  Diagnosis: Axis I: Generalized Anxiety Disorder    Axis II: No diagnosis    Alden Hipp, LCSW 01/26/2019

## 2019-02-10 ENCOUNTER — Ambulatory Visit (INDEPENDENT_AMBULATORY_CARE_PROVIDER_SITE_OTHER): Payer: PRIVATE HEALTH INSURANCE | Admitting: Licensed Clinical Social Worker

## 2019-02-10 ENCOUNTER — Other Ambulatory Visit: Payer: Self-pay

## 2019-02-10 ENCOUNTER — Encounter: Payer: Self-pay | Admitting: Licensed Clinical Social Worker

## 2019-02-10 DIAGNOSIS — F0632 Mood disorder due to known physiological condition with major depressive-like episode: Secondary | ICD-10-CM

## 2019-02-10 DIAGNOSIS — F411 Generalized anxiety disorder: Secondary | ICD-10-CM | POA: Diagnosis not present

## 2019-02-10 NOTE — Progress Notes (Signed)
Virtual Visit via Video Note  I connected with [name] on 02/10/19 at  9:00 AM EST by a video enabled telemedicine application and verified that I am speaking with the correct person using two identifiers.   I discussed the limitations of evaluation and management by telemedicine and the availability of in person appointments. The patient expressed understanding and agreed to proceed.  I discussed the assessment and treatment plan with the patient. The patient was provided an opportunity to ask questions and all were answered. The patient agreed with the plan and demonstrated an understanding of the instructions.   The patient was advised to call back or seek an in-person evaluation if the symptoms worsen or if the condition fails to improve as anticipated.  I provided 60 minutes of non-face-to-face time during this encounter.   Heidi Dach, LCSW    THERAPIST PROGRESS NOTE  Session Time: 0900  Participation Level: Active  Behavioral Response: NeatAlertDepressed  Type of Therapy: Individual Therapy  Treatment Goals addressed: Coping  Interventions: Supportive  Summary: [name] is a 39 y.o. female who presents with continued symptoms related to her diagnosis. Orabelle reports doing "okay" since our last session. She reports there have been ongoing problems within her marriage as they relate to money. She reports attempting to talk to her husband about how she is feeling, but felt he did not receive her feelings well. LCSW held space for [facility] to discuss the issues within her relationship and her attempts to resolve them. She reports she has tried talking to her husband about not feeling they are a team, not feeling he helps with responsibilities around the house, etc. She reported her husband had a lot of excuses for these things, and was unsure "if he wanted to help out." LCSW validated Alithea's feelings and encouraged her to continue verbalizing her feelings to her  husband in an assertive and appropriate way. Tyjai went on to discuss her anxiety around money and her credit cards going to collections. We discussed ways to challenge negative thoughts in order to manage her anxiety around money and upcoming payments. Further, we discussed ways to budget and manage money moving forward.   Suicidal/Homicidal: No  Therapist Response: Almyra continues to work towards her tx goals but has not yet reached them. We will continue to work on improving emotional regulation and distress tolerance skills moving forward.   Plan: Return again in 2 weeks.  Diagnosis: Axis I: MDD    Axis II: No diagnosis    Heidi Dach, LCSW 02/10/2019

## 2019-02-22 ENCOUNTER — Other Ambulatory Visit: Payer: Self-pay

## 2019-02-22 ENCOUNTER — Encounter: Payer: Self-pay | Admitting: Neurology

## 2019-02-22 ENCOUNTER — Ambulatory Visit: Payer: PRIVATE HEALTH INSURANCE | Admitting: Neurology

## 2019-02-22 VITALS — BP 116/76 | HR 100 | Temp 97.7°F | Ht 63.0 in | Wt 181.5 lb

## 2019-02-22 DIAGNOSIS — G35 Multiple sclerosis: Secondary | ICD-10-CM

## 2019-02-22 DIAGNOSIS — G43009 Migraine without aura, not intractable, without status migrainosus: Secondary | ICD-10-CM | POA: Diagnosis not present

## 2019-02-22 DIAGNOSIS — R269 Unspecified abnormalities of gait and mobility: Secondary | ICD-10-CM | POA: Diagnosis not present

## 2019-02-22 DIAGNOSIS — F988 Other specified behavioral and emotional disorders with onset usually occurring in childhood and adolescence: Secondary | ICD-10-CM | POA: Diagnosis not present

## 2019-02-22 MED ORDER — AMPHETAMINE-DEXTROAMPHET ER 30 MG PO CP24: 30.0000 mg | ORAL_CAPSULE | Freq: Every day | ORAL | 0 refills | Status: DC

## 2019-02-22 MED ORDER — BACLOFEN 10 MG PO TABS: ORAL_TABLET | ORAL | 3 refills | Status: DC

## 2019-02-22 MED ORDER — DALFAMPRIDINE ER 10 MG PO TB12: ORAL_TABLET | ORAL | 11 refills | Status: DC

## 2019-02-22 NOTE — Progress Notes (Addendum)
[facility]  PATIENT: [name] DOB: 12/27/1980  REFERRING DOCTOR OR PCP:  Dr. Jennings Books  [facility]) SOURCE: Patient, a couple MRI reports and images on PACS. Lab reports, notes from Dr. Brigitte Pulse  _________________________________   HISTORICAL  CHIEF COMPLAINT:  Chief Complaint  Patient presents with  . Follow-up    RM 12, alone. Last seen 08/19/2018  . Multiple Sclerosis    On Ocrevus. Last infusion 01/11/2019. Next infusion: 07/2019    HISTORY OF PRESENT ILLNESS:  [name] is a 39 y.o. woman with relapsing remitting multiple sclerosis.     Update 02/22/2019: She denies any definiteexacerbations on Ocrevus, however, she is having more difficulty with her left hand.  This has worsened over the past few months.  She tolerates Ocrevus well.   She has been on Ocrevus x 2 years.    She has started Ampyra and notes some benefit with her gait though her leg pain worsened.  She has a right foot drop. She also takes baclofen.   She had one fall in a parking lot.   Her lower back spasms some when she sits a certain way.   She needs to shift a lot.   Her left hand has become slower over there past year.  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    She has had some word finding issues at times.  Mood is doing well.   She is on Wellbutrin and plans on reducing the dose.   She has stress with Covid and kinds being home with school.   She has 3 of her own kids 33 to 37.  Her husband also has 2 kids in the house.    Update 08/19/2018: She feels she is doing okay on Ocrevus but she is noting more fatigue, left arm /hand weakness and right leg weakness/stiffness since the last visit.    She plays guitar and this has been more difficult.    While playing piano, the left hand has been slower but guitar difficulty is new.    She is playing guitar daily as exercise  for her hands.    She takes baclofen 10 mg in am and 20 mg at night   She tolerates Ocrevus.   Labwork in January 2020 was fine.     She has insomnia and was placed on trazodone by psychiatry.    She feels it has helped.  She is noting mild cognitive issues, especially verbal fluency.    Verbal processing is also a problem, especially if two people are talking at once.   She feels mood is doing well.   Increasing zoloft and Wellbutrin have helped.     Adderall has helped the decreased focus and attention relate to her MS.  She takes 30 mg XR in the am and 10 mg later in the day.     She has migraines and is better on Topamax 100 mg.  Of note, the Topamax has been longer than her verbal fluency issues.       Update 02/18/2018: She is on Ocrevus.   She tolerates it well and has not had any exacerbations.  MRI 09/2017 showed no new MS lesions.  She did have shingles and influenza B illness a couple months ago.    Now is having sinus drainage/pressure.  Gait is about the same.   Balance is off  but no worse than last visit.   She has right > left leg weakness and spasticity.   She takes baclofen 10 in am and 20 mg qHS, occ. taking one extra one..    Vision seems the same but she had a lot of difficulty seeing after hr eyes were dilated (eye check was fine).   Bladder function is ok.  She is on oxybutynin but has a dry mouth.   She is feeling more fatigued lately.   She takes Adderall XR 30 mg in the am and an additional 10 mg IR in the afternoon.   This helps with fatigue and attention.   Lots of anxiety this month with Wedding scheduled this weekend.   Depression ok.  She also notes a constant vibration sensation all over.  Also, she notes more word finding difficulty.    She feels she generally does worse in the heat.   She goes to a lot of nursing home/assisted living and they are often hot/humid.     Migraines are doing ok on Topamax.   Update 09/08/2017: Over the last couple weeks, she has noted more  neurologic symptoms including numbness and weakness in the legs, right greater than left.    Current symptoms started about 2 weeks ago.  She denies any viral or bacterial infections.     She is also noting pain in the right leg.    Initially, she had left facial numbness but that resolved aftr a few days.      Of note, she was doing painting about 3 weeks ago, a couple days before current symptoms started, and her leg was giving out and had some pain.     She has also noted more fatigue.  Adderall 30 mg in am and 10 mg at one pm has helped but she is noting more issues in the afternoons,   She is noting more verbal fluency issues.      Vision is fine.   She notes more bladder issues.   Oxybutynin causes dry mouth.  She had her last Ocrevus injection about 3 months ago and she is tolerating it well.    Her last MRI was 09/2016 and showed no new lesions.      Update 08/20/2017: She feels she has been stable on Ocrevus without any recent exacerbation.    At the last visit she had had a cough without fever and CXR was fine.   Her gait is doing well and she rarely uses the walker (just if tired and walking outside).     She has not been able to afford Ampyra.      Her right leg is weaker and more spastic than her left leg.   She notes it more when standing and singing as she feels itr also affects her balance.   She takes baclofen 10 in am and 20 mg in evening.   Oxybutynin helps her bladder function.    She has some fatigue that is helped by Adderall.  She tolerates it well.  She is sleeping well most nights.  Mood is doing okay.  Cognition is okay with some attention deficit helped by the Adderall.  Migraines are doing fairly well.   She did better on topiramate but ran out a few months ago.    Update 02/21/2017: She is on Ocrevus and tolerates it well.  She has not had any exacerbations and feels a little bit better.  She feels gait is better on Ampyra.  She now Hardly ever uses a walker though she  still has to touch the walls for balance. Speed is greatly improved. The right leg is weaker and more spastic than the left leg. He is having less leg pain. Vision has been stable.   She has urinary urgency and frequency that is helped by the oxybutynin. However,  she notes more dry mouth.   We discussed oxybutynin is most likely to cause this if she can change it to an as-needed basis.   Headaches are better on TPM 50 mg.  she tolerates that well. She was on 100 but was able to reduce the dose. She continues to note fatigue and attention deficit. Adderall has helped this. Mood has done better recently. She reports sleeping well most nights but less so since night time baclofen was held.  . She takes trazodone.  From 10/16/2016:  MS:   She had an exacerbation in 2017 and early 2018 switch to ocrelizumab with her first doses in May 2018 (was on Gilenya).   She had another small exacerbation in July (right leg stiff/weak) and she received 3 days of IV site.  She went from using a cane to a walker for balance.  She tolerates the ocrelizumab well.     Gait/strength/sensatoin:  She is using a walker for long distances but is able to walk shorter distances without it.   Her right leg is weaker than her left and gets stiff.   It is painful when stiff.  Her foot drags on the right and catches the floor causing a stumble.   Gait worsenend with the exacerbation in 2017.  She was walking without a cane last year.    Baclofen has helped the spasticity slightly.   There is some allodynia in the legs at times and also the right arm..  We discussed Ampyra for her walking  Vision: She has numbness around her left eye and she feels there is reduced left vision with dots and squiggles in the vision.  The left eye also hurts, worse with movements.. She denies diplopia..  She denies vertigo.  HA:   Migraine headaches have done better on Topamax.  It does cause a dry mouth and change in taste. She now is just having a couple  migraines a month.   When present, they are located in the right forehead and sometimes also the right occiput.   When present, pain is throbbing.    When intense, she also has photophobia and phonophobia.   Moving worsens the pain. She takes NSAIDs with mild benefit..    Bladder:   She is doing better, oxybutynin has helped.   Fatigue/sleep: She has physical and cognitive fatigue. This starts in the mornings but is worse later in the day and if she gets hot. Sometimes she also feels sleepy. Adderall is helping some.   The higher dose of Adderall is only helping a bit.   She is on Adderall XR 30 mg in the am and 10 mg IR at 1 pm.  She sometimes has sleep onset and sleep maintenance insomnia.   Mood/cognition: She notes some depression and frustration.  She sees [employer].   She also has noted difficulty with cognitive skills over the past couple of years.     Her mom has MS and her MS stabilized after a bone marrow transplant for cancer.  MS history:   In late June or early July 2011, she had the onset of numbness that went up  to her belly button. She presented to the emergency room. An MRI of the brain showed 1 periventricular focus with maybe a second subtle periventricular focus. An MRI of the cervical spine showed an enhancing lesion adjacent to C3-C4. The diagnosis of possible MS was made but the evidence was not enough to begin a disease modifying therapy. In 2015, she had the onset of similar symptoms and also had decreased ability to use the left hand. An MRI of the brain at that time showed several new lesions not present in 2011, including a focus in the right middle cerebellar peduncle additionally, she had a lumbar puncture consistent with MS. Because of her symptoms, MRI changes and lumbar puncture results, she was diagnosed with clinical definite relapsing remitting MS and was started on Tecfidera. She has continued on Tecfidera. Over the past few months she has noted more difficulty with  focus and concentration.     She also has had some change in vision more recently. Because of these newer symptoms, and MRI was repeated 04/27/2015. The MRI of the cervical spine showed no new lesions. though the MRI of the brain showed 2  lesions not present in 2015, one in the left juxtacortical white matter and one in the left deep white matter.  She had a small exacerbation in August 2017 and received IV SOlu-Medrol.   MRI cervical spine 2017 an MRI of the brain show two spinal plaques, a definite rightt middle cerebellar peduncle plaque and a possible right pontine plaque and several foci in the hemispheres  MRI 01/2016 showed no new lesions.  She is JCV Ab positive.      REVIEW OF SYSTEMS: Constitutional: No fevers, chills, sweats, or change in appetite.  She has fatigue and insomnia. Eyes: No visual changes, double vision, eye pain Ear, nose and throat: No hearing loss, ear pain, nasal congestion, sore throat Cardiovascular: No chest pain, palpitations Respiratory: No shortness of breath at rest or with exertion.   No wheezes GastrointestinaI: No nausea, vomiting, diarrhea, abdominal pain, fecal incontinence Genitourinary:   She notes frequency, some hesitancy at times and nocturia. Musculoskeletal: No neck pain, back pain Integumentary: No rash, pruritus, skin lesions Neurological: as above Psychiatric: as above Endocrine: No palpitations, diaphoresis, change in appetite, change in weigh or increased thirst Hematologic/Lymphatic: No anemia, purpura, petechiae. Allergic/Immunologic: No itchy/runny eyes, nasal congestion, recent allergic reactions, rashes  ALLERGIES: No Known Allergies  HOME MEDICATIONS:  Current Outpatient Medications:  .  amphetamine-dextroamphetamine (ADDERALL XR) 30 MG 24 hr capsule, Take 1 capsule (30 mg total) by mouth daily., Disp: 30 capsule, Rfl: 0 .  amphetamine-dextroamphetamine (ADDERALL) 10 MG tablet, Take one or two po in the aftrernoon, Disp: 60  tablet, Rfl: 0 .  baclofen (LIORESAL) 10 MG tablet, TAKE 1 TABLET BY MOUTH FOUR TIMES A DAY AS NEEDED FOR MUSCLE SPASMS, Disp: 360 tablet, Rfl: 3 .  buPROPion (WELLBUTRIN XL) 150 MG 24 hr tablet, Take 1 tablet (150 mg total) by mouth daily., Disp: 90 tablet, Rfl: 0 .  Cholecalciferol (VITAMIN D-1000 MAX ST) 1000 units tablet, Take 5,000 Units by mouth daily. , Disp: , Rfl:  .  dalfampridine 10 MG TB12, One po q12 hours, Disp: 60 tablet, Rfl: 11 .  fluticasone (FLONASE) 50 MCG/ACT nasal spray, SPRAY 2 SPRAYS INTO EACH NOSTRIL EVERY DAY, Disp: , Rfl:  .  ocrelizumab 600 mg in sodium chloride 0.9 % 500 mL, Inject 600 mg into the vein every 6 (six) months., Disp: , Rfl:  .  sertraline (ZOLOFT) 100 MG tablet, TAKE 2 TABLETS BY MOUTH EVERY DAY, Disp: 180 tablet, Rfl: 2 .  topiramate (TOPAMAX) 100 MG tablet, TAKE 1 TABLET BY MOUTH EVERYDAY AT BEDTIME, Disp: 90 tablet, Rfl: 3 .  traZODone (DESYREL) 50 MG tablet, TAKE 1.5 TABLETS (75 MG TOTAL) BY MOUTH AT BEDTIME., Disp: 135 tablet, Rfl: 0 .  Vitamin D, Ergocalciferol, (DRISDOL) 1.25 MG (50000 UT) CAPS capsule, Take 1 capsule (50,000 Units total) by mouth every 7 (seven) days. Take 1 capsule weekly for 26 weeks and then go to 5000U OTC daily after, Disp: 26 capsule, Rfl: 0  PAST MEDICAL HISTORY: Past Medical History:  Diagnosis Date  . Depression   . MS (multiple sclerosis) ([place])   . Multiple sclerosis ([place])     PAST SURGICAL HISTORY: History reviewed. No pertinent surgical history.  FAMILY HISTORY: Family History  Problem Relation Age of Onset  . Multiple myeloma Mother   . Multiple sclerosis Mother   . Obesity Sister   . Anxiety disorder Daughter   . Multiple sclerosis Maternal Aunt     SOCIAL HISTORY:  Social History   Socioeconomic History  . Marital status: Married    Spouse name: steven ambrose  . Number of children: 3  . Years of education: Not on file  . Highest education level: Bachelor's degree (e.g., BA, AB, BS)    Occupational History  . Not on file  Tobacco Use  . Smoking status: Never Smoker  . Smokeless tobacco: Never Used  Substance and Sexual Activity  . Alcohol use: Not Currently    Alcohol/week: 0.0 standard drinks    Comment: occasional/fim  . Drug use: No  . Sexual activity: Yes    Birth control/protection: I.U.D.  Other Topics Concern  . Not on file  Social History Narrative  . Not on file   Social Determinants of Health   Financial Resource Strain: Low Risk   . Difficulty of Paying Living Expenses: Not hard at all  Food Insecurity: No Food Insecurity  . Worried About Charity fundraiser in the Last Year: Never true  . Ran Out of Food in the Last Year: Never true  Transportation Needs: No Transportation Needs  . Lack of Transportation (Medical): No  . Lack of Transportation (Non-Medical): No  Physical Activity: Inactive  . Days of Exercise per Week: 0 days  . Minutes of Exercise per Session: 0 min  Stress: No Stress Concern Present  . Feeling of Stress : Only a little  Social Connections: Unknown  . Frequency of Communication with Friends and Family: Not on file  . Frequency of Social Gatherings with Friends and Family: Not on file  . Attends Religious Services: More than 4 times per year  . Active Member of Clubs or Organizations: No  . Attends Archivist Meetings: Never  . Marital Status: Married  Human resources officer Violence: Not At Risk  . Fear of Current or Ex-Partner: No  . Emotionally Abused: No  . Physically Abused: No  . Sexually Abused: No     PHYSICAL EXAM  Vitals:   02/22/19 1057  BP: 116/76  Pulse: 100  Temp: 97.7 F (36.5 C)  Weight: 181 lb 8 oz (82.3 kg)  Height: 5' 3"  (1.6 m)    Body mass index is 32.15 kg/m.   General: The patient is well-developed and well-nourished and in no acute distress    Musculoskeletal:    The neck is nontender with a good range of motion.  Neurologic  Exam  Mental status: The patient is alert and  oriented x 3 at the time of the examination. The patient has apparent normal recent and remote memory, with an apparently normal attention span and concentration ability.   Speech is normal.  Cranial nerves: Extraocular movements are full.  Facial strength and sensation is normal.  Trapezius strength is normal.  The tongue is midline, and the patient has symmetric elevation of the soft palate. No obvious hearing deficits are noted.  Motor:  Muscle bulk is normal.   Tone is mildly increased in legs, bilaterally fairly equally.  Strength is  5 / 5 in all 4 extremities except 4+/5 in the feet.   Sensory: Sensory testing shows reduced vibration in the right leg but touch is symmetric.    Arms are symmetric.    Coordination: Cerebellar testing shows good finger-nose-finger but reduced heel-to-shin slightly worse on the right than left.     Gait and station: Station is normal.  She has a mild right foot drop.  The tandem gait is wide.  Romberg is negative..   Reflexes: Deep tendon reflexes are symmetric and increased in her legs. She has crossed adductor responses at the knees.       DIAGNOSTIC DATA (LABS, IMAGING, TESTING) - I reviewed patient records, labs, notes, testing and imaging myself where available.  Lab Results  Component Value Date   WBC 7.1 12/04/2018   HGB 13.5 12/04/2018   HCT 40.2 12/04/2018   MCV 86 12/04/2018   PLT 348 12/04/2018      Component Value Date/Time   NA 140 12/04/2018 1143   NA 141 07/28/2013 0410   K 3.7 12/04/2018 1143   K 3.7 07/28/2013 0410   CL 105 12/04/2018 1143   CL 103 07/28/2013 0410   CO2 22 12/04/2018 1143   CO2 29 07/28/2013 0410   GLUCOSE 58 (L) 12/04/2018 1143   GLUCOSE 98 09/08/2016 1855   GLUCOSE 121 (H) 07/28/2013 0410   BUN 10 12/04/2018 1143   BUN 16 07/28/2013 0410   CREATININE 0.77 12/04/2018 1143   CREATININE 0.66 07/28/2013 0410   CALCIUM 8.9 12/04/2018 1143   CALCIUM 8.6 07/28/2013 0410   PROT 6.3 12/04/2018 1143   PROT  6.7 07/25/2013 0410   ALBUMIN 4.2 12/04/2018 1143   ALBUMIN 3.3 (L) 07/25/2013 0410   AST 11 12/04/2018 1143   AST 20 07/25/2013 0410   ALT 9 12/04/2018 1143   ALT 18 07/25/2013 0410   ALKPHOS 106 12/04/2018 1143   ALKPHOS 49 07/25/2013 0410   BILITOT <0.2 12/04/2018 1143   BILITOT 0.4 07/25/2013 0410   GFRNONAA 97 12/04/2018 1143   GFRNONAA >60 07/28/2013 0410   GFRAA 112 12/04/2018 1143   GFRAA >60 07/28/2013 0410       ASSESSMENT AND PLAN  1. Relapsing remitting multiple sclerosis ([place])   2. Multiple sclerosis ([facility])   3. Gait disturbance   4. Migraine without aura and without status migrainosus, not intractable   5. Attention deficit disorder, unspecified hyperactivity presence      1.   She will continue Ocrevus.  Check MRI of the brain and cervical spine to determine if there is any subclinical progression.  If this is occurring, consider a different disease modifying therapy. 2.   Continue the Adderall 30 mg XR in the morning with 10 mg as needed later in the day for her reduced attention.    Continue Topamax for migraines 3.    Continue dalfampridine and baclofen  for gait and spasticity issues.  We also discussed getting an AFO brace.  She was provided a referral for this with orthotics. 4.   Return to clinic in 5-6 months or sooner if there are new or worsening neurologic symptoms.   Feliza Diven A. Felecia Shelling, MD, PhD 3/99/0852, 0:50 PM Certified in Neurology, Clinical Neurophysiology, Sleep Medicine, Pain Medicine and Neuroimaging  [facility] [address] [place], [place] [address] [phone] olll

## 2019-02-24 ENCOUNTER — Ambulatory Visit (INDEPENDENT_AMBULATORY_CARE_PROVIDER_SITE_OTHER): Payer: PRIVATE HEALTH INSURANCE | Admitting: Licensed Clinical Social Worker

## 2019-02-24 ENCOUNTER — Encounter: Payer: Self-pay | Admitting: Licensed Clinical Social Worker

## 2019-02-24 ENCOUNTER — Other Ambulatory Visit: Payer: Self-pay

## 2019-02-24 ENCOUNTER — Other Ambulatory Visit: Payer: Self-pay | Admitting: Nurse Practitioner

## 2019-02-24 DIAGNOSIS — F411 Generalized anxiety disorder: Secondary | ICD-10-CM | POA: Diagnosis not present

## 2019-02-24 DIAGNOSIS — F0632 Mood disorder due to known physiological condition with major depressive-like episode: Secondary | ICD-10-CM | POA: Diagnosis not present

## 2019-02-24 NOTE — Progress Notes (Signed)
Virtual Visit via Video Note  I connected with [name] on 02/24/19 at  9:00 AM EST by a video enabled telemedicine application and verified that I am speaking with the correct person using two identifiers.   I discussed the limitations of evaluation and management by telemedicine and the availability of in person appointments. The patient expressed understanding and agreed to proceed.   I discussed the assessment and treatment plan with the patient. The patient was provided an opportunity to ask questions and all were answered. The patient agreed with the plan and demonstrated an understanding of the instructions.   The patient was advised to call back or seek an in-person evaluation if the symptoms worsen or if the condition fails to improve as anticipated.  I provided 60 minutes of non-face-to-face time during this encounter.   Heidi Dach, LCSW    THERAPIST PROGRESS NOTE  Session Time: 0900  Participation Level: Active  Behavioral Response: CasualAlertAnxious  Type of Therapy: Individual Therapy  Treatment Goals addressed: Coping  Interventions: Supportive  Summary: [name] is a 39 y.o. female who presents with continued symptoms related to her diagnosis. [name] reports doing well since our last session. She reports her relationship with her husband has gotten back onto solid ground, which has allowed [name] to feel more at ease within her life. LCSW validated how having a safe place to land is so critical for our mental health, and communication is often the root of problems in relationships. [name] expressed understanding and agreement. 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 [name] reported getting the time wrong, and noted she felt horribly about missing the ceremony. LCSW validated [name]'s feelings and encouraged her to  recognize when she feels herself getting overwhelmed or overloaded, and take a step back to reassess what is causing her stress in her life. [name] expressed understanding and agreement and noted she feels it is due to saying yes to too many engagements. LCSW validated this idea and held space for [name] to discuss the situation and how she feels she can set more boundaries in her life in order to keep her daily tasks more manageable.  Suicidal/Homicidal: No  Therapist Response: [name] continues to work towards her tx goals but has not yet reached them. We will continue to work towards improving distress tolerance and emotional regulation skills moving forward.   Plan: Return again in 2 weeks.  Diagnosis: Axis I: Generalized Anxiety Disorder    Axis II: No diagnosis    Heidi Dach, LCSW 02/24/2019

## 2019-02-26 ENCOUNTER — Other Ambulatory Visit: Payer: Self-pay | Admitting: Neurology

## 2019-02-28 ENCOUNTER — Ambulatory Visit
Admission: RE | Admit: 2019-02-28 | Discharge: 2019-02-28 | Disposition: A | Discharge status: Home / Self Care | Payer: Self-pay | Source: Ambulatory Visit

## 2019-02-28 DIAGNOSIS — M545 Low back pain, unspecified: Secondary | ICD-10-CM

## 2019-02-28 MED ORDER — PREDNISONE 10 MG PO TABS: ORAL_TABLET | ORAL | 0 refills | Status: DC

## 2019-02-28 MED ORDER — CYCLOBENZAPRINE HCL 5 MG PO TABS: 5.0000 mg | ORAL_TABLET | Freq: Two times a day (BID) | ORAL | 0 refills | Status: DC | PRN

## 2019-02-28 MED ORDER — IBUPROFEN 800 MG PO TABS: 800.0000 mg | ORAL_TABLET | Freq: Three times a day (TID) | ORAL | 0 refills | Status: DC

## 2019-02-28 NOTE — ED Provider Notes (Signed)
Virtual Visit via Video Note:  [name]  initiated request for Telemedicine visit with [facility] team. I connected with [name]  on 02/28/2019 at 6:21 PM  for a synchronized telemedicine visit using a video enabled HIPPA compliant telemedicine application. I verified that I am speaking with [name]  using two identifiers. Jeremy Mclamb C Kristen Amborn, PA-C  was physically located in a [facility] Urgent care site and [name] was located at a different location.   The limitations of evaluation and management by telemedicine as well as the availability of in-person appointments were discussed. Patient was informed that she  may incur a bill ( including co-pay) for this virtual visit encounter. [name]  expressed understanding and gave verbal consent to proceed with virtual visit.     History of Present Illness:[name]  is a 39 y.o. female presents for evaluation of back pain and spasming.  Patient states that over the past months she has had lower back pain with intermittent spasming.  Has noted this has been worse in the past few weeks.  She notes that this will be triggered with certain reaching or certain positions.  Spasming will happen for approximately 5 to 10 seconds.  She denies any significant heavy lifting or any specific injury triggering pain.  She uses baclofen for her MS.  She denies any worsening numbness or tingling different from her typical MS symptoms.  Denies any issues in controlling urination or bowel movements.  Denies leg weakness.  Notes that she believes she has some mild spinal stenosis in her lower back.   No Known Allergies   Past Medical History:  Diagnosis Date  . Depression   . MS (multiple sclerosis) ([place])   . Multiple sclerosis ([facility])      Social History   Tobacco Use  . Smoking status: Never Smoker  . Smokeless tobacco: Never Used  Substance Use Topics   . Alcohol use: Not Currently    Alcohol/week: 0.0 standard drinks    Comment: occasional/fim  . Drug use: No        Observations/Objective: Physical Exam  Constitutional: She is oriented to person, place, and time and well-developed, well-nourished, and in no distress. No distress.  HENT:  Head: Normocephalic and atraumatic.  Eyes:  Wearing glasses  Pulmonary/Chest: Effort normal. No respiratory distress.  Speaking full sentences, no respiratory distress  Musculoskeletal:     Cervical back: Normal range of motion.  Neurological: She is alert and oriented to person, place, and time.  Nursing note and vitals reviewed.    Assessment and Plan:    ICD-10-CM   1. Acute bilateral low back pain without sciatica  M54.5     Low back pain without injury.  No red flags for cauda equina.  Does have some mild paresthesias, but no worse than normal MS symptoms.  Will treat with course of prednisone followed by NSAIDs.  Will try Flexeril as alternative to baclofen.  Continue to monitor,Discussed strict return precautions. Patient verbalized understanding and is agreeable with plan.    Follow Up Instructions:     I discussed the assessment and treatment plan with the patient. The patient was provided an opportunity to ask questions and all were answered. The patient agreed with the plan and demonstrated an understanding of the instructions.   The patient was advised to call back or seek an in-person evaluation if the symptoms worsen or if the condition fails to improve as  anticipated.      Lew Dawes, PA-C  02/28/2019 6:21 PM         Lew Dawes, PA-C 02/28/19 1822

## 2019-02-28 NOTE — Discharge Instructions (Signed)
Prednisone taper for 6 days; followed by ibuprofen/tylenol after completeing prednisone course You may use flexeril as needed to help with pain. This is a muscle relaxer and causes sedation- please use only at bedtime or when you will be home and not have to drive/work Follow up if not improving

## 2019-03-01 ENCOUNTER — Encounter: Payer: Self-pay | Admitting: Nurse Practitioner

## 2019-03-03 ENCOUNTER — Ambulatory Visit (INDEPENDENT_AMBULATORY_CARE_PROVIDER_SITE_OTHER): Payer: PRIVATE HEALTH INSURANCE | Admitting: Nurse Practitioner

## 2019-03-03 ENCOUNTER — Ambulatory Visit
Admission: RE | Admit: 2019-03-03 | Discharge: 2019-03-03 | Disposition: A | Discharge status: Home / Self Care | Payer: PRIVATE HEALTH INSURANCE | Source: Ambulatory Visit | Attending: Nurse Practitioner | Admitting: Nurse Practitioner

## 2019-03-03 ENCOUNTER — Encounter: Payer: Self-pay | Admitting: Nurse Practitioner

## 2019-03-03 ENCOUNTER — Other Ambulatory Visit: Payer: Self-pay

## 2019-03-03 VITALS — BP 113/75 | HR 98 | Temp 98.3°F | Ht 63.0 in | Wt 184.0 lb

## 2019-03-03 DIAGNOSIS — M545 Low back pain, unspecified: Secondary | ICD-10-CM

## 2019-03-03 NOTE — Assessment & Plan Note (Signed)
Present for a few months with acute flares.  Will continue Prednisone taper until complete, no further Flexeril.  To use her Baclofen as ordered by neuro.  Recommend Tylenol as needed + use of Icy/Hot Lidocaine patches and Voltaren gel.  Perform gentle stretches at home.  Place referral for PT which would benefit back pain and MS.  Will obtain new imaging lumbar spine.  Return for worsening or ongoing pain.

## 2019-03-03 NOTE — Progress Notes (Signed)
Contacted via MyChart

## 2019-03-03 NOTE — Progress Notes (Signed)
BP 113/75   Pulse 98   Temp 98.3 F (36.8 C) (Oral)   Ht 5\' 3"  (1.6 m)   Wt 184 lb (83.5 kg)   SpO2 99%   BMI 32.59 kg/m    Subjective:    Patient ID: [name], female    DOB: 11-Jan-1981, 39 y.o.   MRN: [id number]  HPI: [name] is a 39 y.o. female  Chief Complaint  Patient presents with  . Back Pain    Lower back spasms x couple months. went to UC last Sunday   BACK PAIN Went to urgent care on 02/28/2019 and was provided Flexeril and Prednisone taper + Ibuprofen.  She reports the Flexeril made her very tired and her body felt like it was fighting itself all day.  Back pain started over the past few months, sporadic, could not connect with one motion.  She will be standing and goes to move, back will freeze up or shifting will cause spasms.  Has been taking 4 Baclofen -- one in morning and 3 at night --- has underlying MS and is followed by neurology.  Of note in 2015 MRI of lumbar spine did note: "IMPRESSION:  1. At L4-5 there is a mild broad-based disc bulge with a slightly  more focal central component. Mild bilateral facet arthropathy and  lateral recess stenosis.  2. At L5-S1 there is grade 1 anterolisthesis of L5 on S1. Severe  bilateral foraminal stenosis. " Duration: months Mechanism of injury: unknown Location: low back Onset: gradual Severity: 9/10 Quality: spasm, aching and throbbing Frequency: intermittent Radiation: none Aggravating factors: shifiting position at times and movement Alleviating factors: Prednisone -- which has offered some benefit, heat/ice, stretching Status: fluctuating Treatments attempted: Prednisone, ice and heat  Relief with NSAIDs?: No NSAIDs Taken Nighttime pain:  occasional, but not a lot Paresthesias / decreased sensation:  no Bowel / bladder incontinence:  no Fevers:  no Dysuria / urinary frequency:  no  Relevant past medical, surgical, family and social history reviewed and updated as  indicated. Interim medical history since our last visit reviewed. Allergies and medications reviewed and updated.  Review of Systems  Constitutional: Negative for activity change, appetite change, diaphoresis, fatigue and fever.  Respiratory: Negative for cough, chest tightness and shortness of breath.   Cardiovascular: Negative for chest pain, palpitations and leg swelling.  Gastrointestinal: Negative.   Genitourinary: Negative.   Musculoskeletal: Positive for back pain.  Neurological: Negative.   Psychiatric/Behavioral: Negative.     Per HPI unless specifically indicated above     Objective:    BP 113/75   Pulse 98   Temp 98.3 F (36.8 C) (Oral)   Ht 5\' 3"  (1.6 m)   Wt 184 lb (83.5 kg)   SpO2 99%   BMI 32.59 kg/m   Wt Readings from Last 3 Encounters:  03/03/19 184 lb (83.5 kg)  02/22/19 181 lb 8 oz (82.3 kg)  12/04/18 178 lb (80.7 kg)    Physical Exam Vitals and nursing note reviewed.  Constitutional:      General: She is awake. She is not in acute distress.    Appearance: She is well-developed. She is obese. She is not ill-appearing.  HENT:     Head: Normocephalic.     Right Ear: Hearing normal.     Left Ear: Hearing normal.  Eyes:     General: Lids are normal.        Right eye: No discharge.  Left eye: No discharge.     Conjunctiva/sclera: Conjunctivae normal.     Pupils: Pupils are equal, round, and reactive to light.  Neck:     Vascular: No carotid bruit.  Cardiovascular:     Rate and Rhythm: Normal rate and regular rhythm.     Heart sounds: Normal heart sounds. No murmur. No gallop.   Pulmonary:     Effort: Pulmonary effort is normal. No accessory muscle usage or respiratory distress.     Breath sounds: Normal breath sounds.  Abdominal:     General: Bowel sounds are normal.     Palpations: Abdomen is soft.  Musculoskeletal:     Cervical back: Normal range of motion and neck supple.     Lumbar back: Normal. No swelling, edema, lacerations,  spasms or tenderness. Normal range of motion.     Right lower leg: No edema.     Left lower leg: No edema.     Comments: Able to perform full ROM without difficulty.  Negative straight leg.  She does reports tenderness with rotation, lateral bend and extension on ROM.  No rashes present.  Skin:    General: Skin is warm and dry.  Neurological:     Mental Status: She is alert and oriented to person, place, and time.  Psychiatric:        Attention and Perception: Attention normal.        Mood and Affect: Mood normal.        Behavior: Behavior normal. Behavior is cooperative.        Thought Content: Thought content normal.        Judgment: Judgment normal.     Results for orders placed or performed in visit on 12/04/18  CBC with Differential/Platelet  Result Value Ref Range   WBC 7.1 3.4 - 10.8 x10E3/uL   RBC 4.68 3.77 - 5.28 x10E6/uL   Hemoglobin 13.5 11.1 - 15.9 g/dL   Hematocrit 02.2 33.6 - 46.6 %   MCV 86 79 - 97 fL   MCH 28.8 26.6 - 33.0 pg   MCHC 33.6 31.5 - 35.7 g/dL   RDW 12.2 44.9 - 75.3 %   Platelets 348 150 - 450 x10E3/uL   Neutrophils 75 Not Estab. %   Lymphs 14 Not Estab. %   Monocytes 7 Not Estab. %   Eos 3 Not Estab. %   Basos 1 Not Estab. %   Neutrophils Absolute 5.4 1.4 - 7.0 x10E3/uL   Lymphocytes Absolute 1.0 0.7 - 3.1 x10E3/uL   Monocytes Absolute 0.5 0.1 - 0.9 x10E3/uL   EOS (ABSOLUTE) 0.2 0.0 - 0.4 x10E3/uL   Basophils Absolute 0.0 0.0 - 0.2 x10E3/uL   Immature Granulocytes 0 Not Estab. %   Immature Grans (Abs) 0.0 0.0 - 0.1 x10E3/uL  Comprehensive metabolic panel  Result Value Ref Range   Glucose 58 (L) 65 - 99 mg/dL   BUN 10 6 - 24 mg/dL   Creatinine, Ser 0.05 0.57 - 1.00 mg/dL   GFR calc non Af Amer 97 >59 mL/min/1.73   GFR calc Af Amer 112 >59 mL/min/1.73   BUN/Creatinine Ratio 13 9 - 23   Sodium 140 134 - 144 mmol/L   Potassium 3.7 3.5 - 5.2 mmol/L   Chloride 105 96 - 106 mmol/L   CO2 22 20 - 29 mmol/L   Calcium 8.9 8.7 - 10.2 mg/dL   Total  Protein 6.3 6.0 - 8.5 g/dL   Albumin 4.2 3.8 - 4.8 g/dL   Globulin, Total  2.1 1.5 - 4.5 g/dL   Albumin/Globulin Ratio 2.0 1.2 - 2.2   Bilirubin Total <0.2 0.0 - 1.2 mg/dL   Alkaline Phosphatase 106 39 - 117 IU/L   AST 11 0 - 40 IU/L   ALT 9 0 - 32 IU/L  Lipid Panel w/o Chol/HDL Ratio  Result Value Ref Range   Cholesterol, Total 152 100 - 199 mg/dL   Triglycerides 94 0 - 149 mg/dL   HDL 52 >67 mg/dL   VLDL Cholesterol Cal 17 5 - 40 mg/dL   LDL Chol Calc (NIH) 83 0 - 99 mg/dL  TSH  Result Value Ref Range   TSH 1.120 0.450 - 4.500 uIU/mL  VITAMIN D 25 Hydroxy (Vit-D Deficiency, Fractures)  Result Value Ref Range   Vit D, 25-Hydroxy 46.0 30.0 - 100.0 ng/mL      Assessment & Plan:   Problem List Items Addressed This Visit      Other   Acute midline low back pain without sciatica - Primary    Present for a few months with acute flares.  Will continue Prednisone taper until complete, no further Flexeril.  To use her Baclofen as ordered by neuro.  Recommend Tylenol as needed + use of Icy/Hot Lidocaine patches and Voltaren gel.  Perform gentle stretches at home.  Place referral for PT which would benefit back pain and MS.  Will obtain new imaging lumbar spine.  Return for worsening or ongoing pain.      Relevant Orders   DG Lumbar Spine Complete       Follow up plan: Return if symptoms worsen or fail to improve.

## 2019-03-03 NOTE — Patient Instructions (Signed)
Acute Back Pain, Adult Acute back pain is sudden and usually short-lived. It is often caused by an injury to the muscles and tissues in the back. The injury may result from:  A muscle or ligament getting overstretched or torn (strained). Ligaments are tissues that connect bones to each other. Lifting something improperly can cause a back strain.  Wear and tear (degeneration) of the spinal disks. Spinal disks are circular tissue that provides cushioning between the bones of the spine (vertebrae).  Twisting motions, such as while playing sports or doing yard work.  A hit to the back.  Arthritis. You may have a physical exam, lab tests, and imaging tests to find the cause of your pain. Acute back pain usually goes away with rest and home care. Follow these instructions at home: Managing pain, stiffness, and swelling  Take over-the-counter and prescription medicines only as told by your health care provider.  Your health care provider may recommend applying ice during the first 24-48 hours after your pain starts. To do this: ? Put ice in a plastic bag. ? Place a towel between your skin and the bag. ? Leave the ice on for 20 minutes, 2-3 times a day.  If directed, apply heat to the affected area as often as told by your health care provider. Use the heat source that your health care provider recommends, such as a moist heat pack or a heating pad. ? Place a towel between your skin and the heat source. ? Leave the heat on for 20-30 minutes. ? Remove the heat if your skin turns bright red. This is especially important if you are unable to feel pain, heat, or cold. You have a greater risk of getting burned. Activity   Do not stay in bed. Staying in bed for more than 1-2 days can delay your recovery.  Sit up and stand up straight. Avoid leaning forward when you sit, or hunching over when you stand. ? If you work at a desk, sit close to it so you do not need to lean over. Keep your chin tucked  in. Keep your neck drawn back, and keep your elbows bent at a right angle. Your arms should look like the letter "L." ? Sit high and close to the steering wheel when you drive. Add lower back (lumbar) support to your car seat, if needed.  Take short walks on even surfaces as soon as you are able. Try to increase the length of time you walk each day.  Do not sit, drive, or stand in one place for more than 30 minutes at a time. Sitting or standing for long periods of time can put stress on your back.  Do not drive or use heavy machinery while taking prescription pain medicine.  Use proper lifting techniques. When you bend and lift, use positions that put less stress on your back: ? Bend your knees. ? Keep the load close to your body. ? Avoid twisting.  Exercise regularly as told by your health care provider. Exercising helps your back heal faster and helps prevent back injuries by keeping muscles strong and flexible.  Work with a physical therapist to make a safe exercise program, as recommended by your health care provider. Do any exercises as told by your physical therapist. Lifestyle  Maintain a healthy weight. Extra weight puts stress on your back and makes it difficult to have good posture.  Avoid activities or situations that make you feel anxious or stressed. Stress and anxiety increase muscle   tension and can make back pain worse. Learn ways to manage anxiety and stress, such as through exercise. General instructions  Sleep on a firm mattress in a comfortable position. Try lying on your side with your knees slightly bent. If you lie on your back, put a pillow under your knees.  Follow your treatment plan as told by your health care provider. This may include: ? Cognitive or behavioral therapy. ? Acupuncture or massage therapy. ? Meditation or yoga. Contact a health care provider if:  You have pain that is not relieved with rest or medicine.  You have increasing pain going down  into your legs or buttocks.  Your pain does not improve after 2 weeks.  You have pain at night.  You lose weight without trying.  You have a fever or chills. Get help right away if:  You develop new bowel or bladder control problems.  You have unusual weakness or numbness in your arms or legs.  You develop nausea or vomiting.  You develop abdominal pain.  You feel faint. Summary  Acute back pain is sudden and usually short-lived.  Use proper lifting techniques. When you bend and lift, use positions that put less stress on your back.  Take over-the-counter and prescription medicines and apply heat or ice as directed by your health care provider. This information is not intended to replace advice given to you by your health care provider. Make sure you discuss any questions you have with your health care provider. Document Revised: 05/12/2018 Document Reviewed: 09/04/2016 [employer] Patient Education  2020 [employer]  

## 2019-03-04 ENCOUNTER — Ambulatory Visit: Payer: PRIVATE HEALTH INSURANCE | Admitting: Nurse Practitioner

## 2019-03-04 ENCOUNTER — Telehealth: Payer: PRIVATE HEALTH INSURANCE | Admitting: Nurse Practitioner

## 2019-03-05 ENCOUNTER — Encounter: Payer: Self-pay | Admitting: Nurse Practitioner

## 2019-03-05 ENCOUNTER — Ambulatory Visit (INDEPENDENT_AMBULATORY_CARE_PROVIDER_SITE_OTHER): Payer: PRIVATE HEALTH INSURANCE | Admitting: Psychiatry

## 2019-03-05 ENCOUNTER — Other Ambulatory Visit: Payer: Self-pay

## 2019-03-05 ENCOUNTER — Encounter: Payer: Self-pay | Admitting: Psychiatry

## 2019-03-05 DIAGNOSIS — F0632 Mood disorder due to known physiological condition with major depressive-like episode: Secondary | ICD-10-CM | POA: Diagnosis not present

## 2019-03-05 DIAGNOSIS — F411 Generalized anxiety disorder: Secondary | ICD-10-CM

## 2019-03-05 DIAGNOSIS — G4700 Insomnia, unspecified: Secondary | ICD-10-CM

## 2019-03-05 DIAGNOSIS — Z79899 Other long term (current) drug therapy: Secondary | ICD-10-CM | POA: Diagnosis not present

## 2019-03-05 MED ORDER — PROPRANOLOL HCL 10 MG PO TABS: 10.0000 mg | ORAL_TABLET | Freq: Every day | ORAL | 1 refills | Status: DC | PRN

## 2019-03-05 MED ORDER — BUPROPION HCL ER (XL) 150 MG PO TB24: 150.0000 mg | ORAL_TABLET | Freq: Every day | ORAL | 0 refills | Status: DC

## 2019-03-05 MED ORDER — TRAZODONE HCL 50 MG PO TABS: 75.0000 mg | ORAL_TABLET | Freq: Every day | ORAL | 0 refills | Status: DC

## 2019-03-05 NOTE — Progress Notes (Signed)
Provider Location : [facility] Patient Location : Home   Virtual Visit via Video Note  I connected with [name] on 03/05/19 at  8:30 AM EST by a video enabled telemedicine application and verified that I am speaking with the correct person using two identifiers.   I discussed the limitations of evaluation and management by telemedicine and the availability of in person appointments. The patient expressed understanding and agreed to proceed.     I discussed the assessment and treatment plan with the patient. The patient was provided an opportunity to ask questions and all were answered. The patient agreed with the plan and demonstrated an understanding of the instructions.   The patient was advised to call back or seek an in-person evaluation if the symptoms worsen or if the condition fails to improve as anticipated.   [place] MD OP Progress Note  03/05/2019 8:56 AM [name]  MRN:  [id number]  Chief Complaint:  Chief Complaint    Follow-up     HPI: [name] is a 39 year old Caucasian female who has a history of depression, GAD, insomnia, optic neuritis, multiple sclerosis, migraine headaches was evaluated by telemedicine today.  Patient today reports she is currently struggling with back pain.  She reports she is on a prednisone tapering dosage.  She had imaging done of her lumbar spine which came back okay.  She reports she continues to follow-up with her providers.  She reports she is scheduled to start physical therapy sessions soon.  I have reviewed lumbar spine imaging from 03/03/2019-within normal limits.  Patient reports she has noticed some heart palpitation especially when she lays down to sleep at night.  This has been going on probably since the past 1 month or so.  She reports it happens every night.  She tries to breathe and do relaxation techniques and that seems to help to some extent.  She does not know if this is anxiety induced or not.  She  denies any significant depressive symptoms.  She denies any suicidality, homicidality or perceptual disturbances.  Patient denies any other concerns today.   Visit Diagnosis:    ICD-10-CM   1. 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 GAD (generalized anxiety disorder)  F41.1 propranolol (INDERAL) 10 MG tablet  3. Insomnia, unspecified type  G47.00 traZODone (DESYREL) 50 MG tablet  4. High risk medication use  Z79.899 EKG 12-Lead    Past Psychiatric History: I have reviewed past psychiatric history from my progress note on 05/21/2018.  Past trials of Zoloft, Adderall, trazodone.  Past Medical History:  Past Medical History:  Diagnosis Date  . Depression   . MS (multiple sclerosis) ([place])   . Multiple sclerosis ([place])    History reviewed. No pertinent surgical history.  Family Psychiatric History: Reviewed family psychiatric history from my progress note on 05/21/2018.  Family History:  Family History  Problem Relation Age of Onset  . Multiple myeloma Mother   . Multiple sclerosis Mother   . Obesity Sister   . Anxiety disorder Daughter   . Multiple sclerosis Maternal Aunt     Social History: Reviewed social history from my progress note on 05/21/2018. Social History   Socioeconomic History  . Marital status: Married    Spouse name: steven ambrose  . Number of children: 3  . Years of education: Not on file  . Highest education level: Bachelor's degree (e.g., BA, AB, BS)  Occupational History  . Not on file  Tobacco Use  . Smoking status: Never Smoker  . Smokeless tobacco: Never Used  Substance and Sexual Activity  . Alcohol use: Not Currently    Alcohol/week: 0.0 standard drinks    Comment: occasional/fim  . Drug use: No  . Sexual activity: Yes    Birth control/protection: I.U.D.  Other Topics Concern  . Not on file  Social History Narrative  . Not on file    Social Determinants of Health   Financial Resource Strain: Low Risk   . Difficulty of Paying Living Expenses: Not hard at all  Food Insecurity: No Food Insecurity  . Worried About Charity fundraiser in the Last Year: Never true  . Ran Out of Food in the Last Year: Never true  Transportation Needs: No Transportation Needs  . Lack of Transportation (Medical): No  . Lack of Transportation (Non-Medical): No  Physical Activity: Inactive  . Days of Exercise per Week: 0 days  . Minutes of Exercise per Session: 0 min  Stress: No Stress Concern Present  . Feeling of Stress : Only a little  Social Connections: Unknown  . Frequency of Communication with Friends and Family: Not on file  . Frequency of Social Gatherings with Friends and Family: Not on file  . Attends Religious Services: More than 4 times per year  . Active Member of Clubs or Organizations: No  . Attends Archivist Meetings: Never  . Marital Status: Married    Allergies:  Allergies  Allergen Reactions  . Flexeril [Cyclobenzaprine]     Drowsy     Metabolic Disorder Labs: No results found for: HGBA1C, MPG No results found for: PROLACTIN Lab Results  Component Value Date   CHOL 152 12/04/2018   TRIG 94 12/04/2018   HDL 52 12/04/2018   CHOLHDL 2.6 12/02/2017   LDLCALC 83 12/04/2018   LDLCALC 63 12/02/2017   Lab Results  Component Value Date   TSH 1.120 12/04/2018   TSH 1.240 09/08/2017    Therapeutic Level Labs: No results found for: LITHIUM No results found for: VALPROATE No components found for:  CBMZ  Current Medications: Current Outpatient Medications  Medication Sig Dispense Refill  . amphetamine-dextroamphetamine (ADDERALL XR) 30 MG 24 hr capsule Take 1 capsule (30 mg total) by mouth daily. 30 capsule 0  . amphetamine-dextroamphetamine (ADDERALL) 10 MG tablet Take one or two po in the aftrernoon (Patient not taking: Reported on 03/03/2019) 60 tablet 0  . baclofen (LIORESAL) 10 MG tablet  TAKE 1 TABLET BY MOUTH FOUR TIMES A DAY AS NEEDED FOR MUSCLE SPASMS 360 tablet 3  . buPROPion (WELLBUTRIN XL) 150 MG 24 hr tablet Take 1 tablet (150 mg total) by mouth daily. 90 tablet 0  . Cholecalciferol (VITAMIN D-1000 MAX ST) 1000 units tablet Take 5,000 Units by mouth daily.     Marland Kitchen dalfampridine 10 MG TB12 One po q12 hours (Patient not taking: Reported on 03/03/2019) 60 tablet 11  . fluticasone (FLONASE) 50 MCG/ACT nasal spray SPRAY 2 SPRAYS INTO EACH NOSTRIL EVERY DAY    . ibuprofen (ADVIL) 800 MG tablet Take 1 tablet (800 mg total) by mouth 3 (three) times daily. 21 tablet 0  . ocrelizumab 600 mg in sodium chloride 0.9 % 500 mL Inject 600 mg into the vein every 6 (six) months.    . predniSONE (DELTASONE) 10 MG tablet Begin with 6 tabs, decrease by 1 tab each day- 6,5,4,3,2,1 21 tablet 0  . propranolol (INDERAL) 10  MG tablet Take 1 tablet (10 mg total) by mouth daily as needed. For severe anxiety attack 30 tablet 1  . sertraline (ZOLOFT) 100 MG tablet TAKE 2 TABLETS BY MOUTH EVERY DAY 180 tablet 2  . topiramate (TOPAMAX) 100 MG tablet TAKE 1 TABLET BY MOUTH EVERYDAY AT BEDTIME 90 tablet 3  . traZODone (DESYREL) 50 MG tablet Take 1.5 tablets (75 mg total) by mouth at bedtime. 135 tablet 0   No current facility-administered medications for this visit.     Musculoskeletal: Strength & Muscle Tone: UTA Gait & Station: normal Patient leans: N/A  Psychiatric Specialty Exam: Review of Systems  Cardiovascular: Positive for palpitations.  Musculoskeletal: Positive for back pain.  Neurological: Positive for numbness (UE).  Psychiatric/Behavioral: The patient is nervous/anxious.   All other systems reviewed and are negative.   There were no vitals taken for this visit.There is no height or weight on file to calculate BMI.  General Appearance: Casual  Eye Contact:  Fair  Speech:  Clear and Coherent  Volume:  Normal  Mood:  Anxious  Affect:  Congruent  Thought Process:  Goal Directed and  Descriptions of Associations: Intact  Orientation:  Full (Time, Place, and Person)  Thought Content: Logical   Suicidal Thoughts:  No  Homicidal Thoughts:  No  Memory:  Immediate;   Fair Recent;   Fair Remote;   Fair  Judgement:  Fair  Insight:  Fair  Psychomotor Activity:  Normal  Concentration:  Concentration: Fair and Attention Span: Fair  Recall:  [employer] of Knowledge: Fair  Language: Fair  Akathisia:  No  Handed:  Right  AIMS (if indicated):UTA  Assets:  Communication Skills Desire for Improvement Housing Social Support  ADL's:  Intact  Cognition: WNL  Sleep:  Fair   Screenings: GAD-7     Office Visit from 12/04/2018 in [facility] Visit from 06/03/2018 in [facility] Visit from 04/03/2018 in [facility] Visit from 12/02/2017 in [facility]  Total GAD-7 Score  _0 PHQ2-9     Office Visit from 12/04/2018 in [facility] Visit from 06/03/2018 in [facility] Visit from 04/03/2018 in [facility] Visit from 12/02/2017 in [facility] Visit from 11/18/2017 in [facility]  PHQ-2 Total Score  _1 0  PHQ-9 Total Score  _2 Assessment and Plan: [name] is a 39 year old Caucasian female, married, unemployed, lives in [place], has a history of depression, anxiety, MS, fatigue, insomnia was evaluated by telemedicine today.  Patient is biologically predisposed given her family history and multiple sclerosis diagnosis.  She also has psychosocial stressors of chronic health issues, children's mental health problems and academic issues and the current pandemic.  Patient is currently struggling with multiple health issues as well as anxiety symptoms.  She will benefit from the following plan.  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  daily Zoloft 200 mg p.o. daily  Insomnia-stable Trazodone 75 mg p.o. nightly  GAD-unstable Start propranolol 10 mg p.o. daily as needed for anxiety attacks Zoloft as prescribed Continue CBT with Ms. Cecilie Lowers  Patient with heart palpitations, since the past 1 month per report, patient is on multiple medications which could contribute to it including her stimulant medication.  Will order  EKG.  Patient also advised to reach out to her primary care provider regarding her heart palpitation.  Follow-up in clinic in 4 weeks or sooner if needed.  February 25 at 9 AM  I have spent atleast 30 minutes non face to face with patient today. More than 50 % of the time was spent for preparing to see the patient ( e.g., review of test, records ), obtaining and to review and separately obtained history , ordering medications and test ,psychoeducation and supportive psychotherapy and care coordination,as well as documenting clinical information in electronic health record.   This note was generated in part or whole with voice recognition software. Voice recognition is usually quite accurate but there are transcription errors that can and very often do occur. I apologize for any typographical errors that were not detected and corrected.       Ursula Alert, MD 03/05/2019, 8:56 AM

## 2019-03-08 ENCOUNTER — Telehealth: Payer: Self-pay | Admitting: Neurology

## 2019-03-08 NOTE — Telephone Encounter (Signed)
MRI -  Imaging made [place] aware that patient's MRI was denied . Reason for Denial did not meet medical necessity. Thanks Annabelle Harman.   Kara Mead I will place Denial on your desk for review.

## 2019-03-10 ENCOUNTER — Encounter: Payer: Self-pay | Admitting: Licensed Clinical Social Worker

## 2019-03-10 ENCOUNTER — Ambulatory Visit (INDEPENDENT_AMBULATORY_CARE_PROVIDER_SITE_OTHER): Payer: PRIVATE HEALTH INSURANCE | Admitting: Licensed Clinical Social Worker

## 2019-03-10 ENCOUNTER — Other Ambulatory Visit: Payer: Self-pay

## 2019-03-10 DIAGNOSIS — F411 Generalized anxiety disorder: Secondary | ICD-10-CM | POA: Diagnosis not present

## 2019-03-10 DIAGNOSIS — F0632 Mood disorder due to known physiological condition with major depressive-like episode: Secondary | ICD-10-CM

## 2019-03-10 NOTE — Progress Notes (Signed)
Virtual Visit via Video Note  I connected with [name] on 03/10/19 at  9:00 AM EST by a video enabled telemedicine application and verified that I am speaking with the correct person using two identifiers.   I discussed the limitations of evaluation and management by telemedicine and the availability of in person appointments. The patient expressed understanding and agreed to proceed.  I discussed the assessment and treatment plan with the patient. The patient was provided an opportunity to ask questions and all were answered. The patient agreed with the plan and demonstrated an understanding of the instructions.   The patient was advised to call back or seek an in-person evaluation if the symptoms worsen or if the condition fails to improve as anticipated.  I provided 60 minutes of non-face-to-face time during this encounter.   Kristen Dach, LCSW    THERAPIST PROGRESS NOTE  Session Time: 0900  Participation Level: Active  Behavioral Response: NeatAlertAnxious  Type of Therapy: Individual Therapy  Treatment Goals addressed: Coping  Interventions: Supportive  Summary: [name] is a 39 y.o. female who presents with continued symptoms related to her diagnosis. [name] reports doing well since our last session, but noted ongoing anxiety/depression around various situations in her life. [name] reported, "I feel like I'm in groundhog day. Everything is the same every day. Same stuff every day." LCSW validated [name]'s feelings and encouraged her to elaborate on what specifically is feeling monotonous lately. [name] reported her children are still not listening and getting up for school, etc. She noted it brings up feelings from her last relationship when she felt she was not a good mother. LCSW validated her feelings but encouraged her to challenge those thoughts as they come up, and assisted her in walking through ways to challenge those thoughts.  [name] moved on to talking about her relationship, and how things there have continued to be stressful where money is concerned. [name] reported her husband put her on her bank account, but noted she wasn't allowed to spend the money in the account. She reported feeling very resentful and "I'm trying to think the best, but there's a part of me that thinks the only reason he's helped me before is because he saw I didn't waste the stimulus money that came in." LCSW validated [name]'s feelings and asked her to consider what larger issue there could be that may be reflected in this situation with the money. [name] was able to articulate she feels he thinks she is irresponsible with money and "he gets upset that my kids eat what they eat." LCSW pointed out that [name]'s husband and his children are still living with his mother, and he is not the primary caretaker for his children so he is not able to fully understand how she navigates her days. [name] expressed understanding and agreement. LCSW encouraged [name] to have an open conversation with her husband in order to figure out where they are not aligning.   Suicidal/Homicidal: No  Therapist Response: [name] continues to work towards her tx goals but has not yet reached them. We will continue to work on improving emotional regulation skills and distress tolerance moving fiorward. We will also continue to work on improving CBT skills moving forward.   Plan: Return again in 2 weeks.  Diagnosis: Axis I: Generalized Anxiety Disorder    Axis II: No diagnosis    Kristen Dach, LCSW 03/10/2019

## 2019-03-15 ENCOUNTER — Other Ambulatory Visit: Payer: Self-pay

## 2019-03-15 ENCOUNTER — Ambulatory Visit (INDEPENDENT_AMBULATORY_CARE_PROVIDER_SITE_OTHER): Payer: PRIVATE HEALTH INSURANCE | Admitting: Nurse Practitioner

## 2019-03-15 ENCOUNTER — Encounter: Payer: Self-pay | Admitting: Nurse Practitioner

## 2019-03-15 VITALS — BP 113/75 | HR 102 | Temp 98.3°F

## 2019-03-15 DIAGNOSIS — F418 Other specified anxiety disorders: Secondary | ICD-10-CM

## 2019-03-15 NOTE — Patient Instructions (Signed)

## 2019-03-15 NOTE — Progress Notes (Signed)
BP 113/75   Pulse (!) 102   Temp 98.3 F (36.8 C) (Oral)   SpO2 100%    Subjective:    Patient ID: [name], female    DOB: Oct 26, 1980, 39 y.o.   MRN: [id number]  HPI: [name] is a 39 y.o. female  Chief Complaint  Patient presents with  . Follow-up   DEPRESSION Followed by psychiatry at [facility], Dr. Elna Breslow.  Last seen 03/05/2019, at visit she reported some palpitations at night that are relieved by meditation and relaxation.  Dr. Elna Breslow started Propranolol 10 MG as needed, she has not taken this yet.  Denies any N&V, diaphoresis, CP, SOB with palpitations.  Does endorse increased anxiety.  Dr. Elna Breslow recommended visit with PCP for EKG.  Patient also is followed by therapist and last saw 03/10/2019.  Continues on Sertraline, Wellbutrin, Adderall, and Trazodone. Mood status: stable Satisfied with current treatment?: yes Symptom severity: moderate  Duration of current treatment : chronic Side effects: no Medication compliance: good compliance Psychotherapy/counseling: yes current Depressed mood: no Anxious mood: yes Anhedonia: no Significant weight loss or gain: no Insomnia: yes hard to fall asleep Fatigue: no Feelings of worthlessness or guilt: no Impaired concentration/indecisiveness: yes Suicidal ideations: no Hopelessness: no Crying spells: no Depression screen [facility] 2/9 03/15/2019 12/04/2018 06/03/2018 04/03/2018 12/02/2017  Decreased Interest 1 1 2 3 1   Down, Depressed, Hopeless 1 2 0 2 1  PHQ - 2 Score 2 3 2 5 2   Altered sleeping 2 1 0 1 1  Tired, decreased energy 3 3 3 3 3   Change in appetite 3 3 0 3 2  Feeling bad or failure about yourself  2 3 2 3  0  Trouble concentrating 1 2 1 3 1   Moving slowly or fidgety/restless 0 0 0 0 0  Suicidal thoughts 0 0 0 0 0  PHQ-9 Score 13 15 8 18 9   Difficult doing work/chores Somewhat difficult Somewhat difficult Not difficult at all Somewhat difficult Very difficult   GAD 7 : Generalized Anxiety Score  12/04/2018 06/03/2018 04/03/2018 12/02/2017  Nervous, Anxious, on Edge 1 0 1 0  Control/stop worrying 3 0 3 0  Worry too much - different things 1 2 3  0  Trouble relaxing 2 1 3 1   Restless 2 0 2 0  Easily annoyed or irritable 2 1 2 1   Afraid - awful might happen 0 1 2 0  Total GAD 7 Score 11 5 16 2   Anxiety Difficulty Somewhat difficult - Somewhat difficult Not difficult at all    Relevant past medical, surgical, family and social history reviewed and updated as indicated. Interim medical history since our last visit reviewed. Allergies and medications reviewed and updated.  Review of Systems  Constitutional: Negative for activity change, appetite change, diaphoresis, fatigue and fever.  Respiratory: Negative for cough, chest tightness and shortness of breath.   Cardiovascular: Negative for chest pain, palpitations and leg swelling.  Gastrointestinal: Negative.   Neurological: Negative.   Psychiatric/Behavioral: Negative.     Per HPI unless specifically indicated above     Objective:    BP 113/75   Pulse (!) 102   Temp 98.3 F (36.8 C) (Oral)   SpO2 100%   Wt Readings from Last 3 Encounters:  03/03/19 184 lb (83.5 kg)  02/22/19 181 lb 8 oz (82.3 kg)  12/04/18 178 lb (80.7 kg)    Physical Exam Vitals and nursing note reviewed.  Constitutional:      General: She is awake.  She is not in acute distress.    Appearance: She is well-developed and overweight. She is not ill-appearing.  HENT:     Head: Normocephalic.     Right Ear: Hearing normal.     Left Ear: Hearing normal.     Nose: Nose normal.  Eyes:     General: Lids are normal.        Right eye: No discharge.        Left eye: No discharge.     Conjunctiva/sclera: Conjunctivae normal.     Pupils: Pupils are equal, round, and reactive to light.  Cardiovascular:     Rate and Rhythm: Normal rate and regular rhythm.     Heart sounds: Normal heart sounds. No murmur. No gallop.   Pulmonary:     Effort: Pulmonary  effort is normal. No accessory muscle usage or respiratory distress.     Breath sounds: Normal breath sounds.  Abdominal:     General: Bowel sounds are normal.     Palpations: Abdomen is soft.  Musculoskeletal:     Cervical back: Normal range of motion and neck supple.     Right lower leg: No edema.     Left lower leg: No edema.  Skin:    General: Skin is warm and dry.  Neurological:     Mental Status: She is alert and oriented to person, place, and time.  Psychiatric:        Attention and Perception: Attention normal.        Mood and Affect: Mood normal.        Behavior: Behavior normal. Behavior is cooperative.        Thought Content: Thought content normal.        Judgment: Judgment normal.    EKG in office with NSR and normal axis, rate 96.    Results for orders placed or performed in visit on 12/04/18  CBC with Differential/Platelet  Result Value Ref Range   WBC 7.1 3.4 - 10.8 x10E3/uL   RBC 4.68 3.77 - 5.28 x10E6/uL   Hemoglobin 13.5 11.1 - 15.9 g/dL   Hematocrit 91.6 60.6 - 46.6 %   MCV 86 79 - 97 fL   MCH 28.8 26.6 - 33.0 pg   MCHC 33.6 31.5 - 35.7 g/dL   RDW 00.4 59.9 - 77.4 %   Platelets 348 150 - 450 x10E3/uL   Neutrophils 75 Not Estab. %   Lymphs 14 Not Estab. %   Monocytes 7 Not Estab. %   Eos 3 Not Estab. %   Basos 1 Not Estab. %   Neutrophils Absolute 5.4 1.4 - 7.0 x10E3/uL   Lymphocytes Absolute 1.0 0.7 - 3.1 x10E3/uL   Monocytes Absolute 0.5 0.1 - 0.9 x10E3/uL   EOS (ABSOLUTE) 0.2 0.0 - 0.4 x10E3/uL   Basophils Absolute 0.0 0.0 - 0.2 x10E3/uL   Immature Granulocytes 0 Not Estab. %   Immature Grans (Abs) 0.0 0.0 - 0.1 x10E3/uL  Comprehensive metabolic panel  Result Value Ref Range   Glucose 58 (L) 65 - 99 mg/dL   BUN 10 6 - 24 mg/dL   Creatinine, Ser 1.42 0.57 - 1.00 mg/dL   GFR calc non Af Amer 97 >59 mL/min/1.73   GFR calc Af Amer 112 >59 mL/min/1.73   BUN/Creatinine Ratio 13 9 - 23   Sodium 140 134 - 144 mmol/L   Potassium 3.7 3.5 - 5.2  mmol/L   Chloride 105 96 - 106 mmol/L   CO2 22 20 - 29 mmol/L  Calcium 8.9 8.7 - 10.2 mg/dL   Total Protein 6.3 6.0 - 8.5 g/dL   Albumin 4.2 3.8 - 4.8 g/dL   Globulin, Total 2.1 1.5 - 4.5 g/dL   Albumin/Globulin Ratio 2.0 1.2 - 2.2   Bilirubin Total <0.2 0.0 - 1.2 mg/dL   Alkaline Phosphatase 106 39 - 117 IU/L   AST 11 0 - 40 IU/L   ALT 9 0 - 32 IU/L  Lipid Panel w/o Chol/HDL Ratio  Result Value Ref Range   Cholesterol, Total 152 100 - 199 mg/dL   Triglycerides 94 0 - 149 mg/dL   HDL 52 >39 mg/dL   VLDL Cholesterol Cal 17 5 - 40 mg/dL   LDL Chol Calc (NIH) 83 0 - 99 mg/dL  TSH  Result Value Ref Range   TSH 1.120 0.450 - 4.500 uIU/mL  VITAMIN D 25 Hydroxy (Vit-D Deficiency, Fractures)  Result Value Ref Range   Vit D, 25-Hydroxy 46.0 30.0 - 100.0 ng/mL      Assessment & Plan:   Problem List Items Addressed This Visit      Other   Depression with anxiety - Primary    Chronic, ongoing.  Followed by psychiatry.  EKG in office today with NSR.  Suspect palpitations related to anxiety in evening and recommend she continued mediatation and try Propranolol as ordered by psychiatry.  Continue current medication regimen as ordered by psychiatry and adjust as needed.  Recent notes reviewed.  Return as scheduled.      Relevant Orders   EKG 12-Lead (Completed)       Follow up plan: Return for as scheduled.

## 2019-03-15 NOTE — Assessment & Plan Note (Signed)
Chronic, ongoing.  Followed by psychiatry.  EKG in office today with NSR.  Suspect palpitations related to anxiety in evening and recommend she continued mediatation and try Propranolol as ordered by psychiatry.  Continue current medication regimen as ordered by psychiatry and adjust as needed.  Recent notes reviewed.  Return as scheduled.

## 2019-03-18 ENCOUNTER — Telehealth: Payer: Self-pay | Admitting: *Deleted

## 2019-03-18 NOTE — Telephone Encounter (Signed)
Faxed updated prescriber service form to [employer] at [phone]. Received fax confirmation. 

## 2019-03-20 ENCOUNTER — Encounter: Payer: Self-pay | Admitting: Neurology

## 2019-03-22 NOTE — Telephone Encounter (Signed)
Faxed printed/signed appeal letter from MD to [employer] at [phone]. Received fax confirmation. Waiting on determination.

## 2019-03-24 ENCOUNTER — Ambulatory Visit (INDEPENDENT_AMBULATORY_CARE_PROVIDER_SITE_OTHER): Payer: PRIVATE HEALTH INSURANCE | Admitting: Licensed Clinical Social Worker

## 2019-03-24 ENCOUNTER — Other Ambulatory Visit: Payer: Self-pay

## 2019-03-24 ENCOUNTER — Encounter: Payer: Self-pay | Admitting: Licensed Clinical Social Worker

## 2019-03-24 DIAGNOSIS — F411 Generalized anxiety disorder: Secondary | ICD-10-CM

## 2019-03-24 DIAGNOSIS — F0632 Mood disorder due to known physiological condition with major depressive-like episode: Secondary | ICD-10-CM

## 2019-03-24 NOTE — Progress Notes (Signed)
Virtual Visit via Video Note  I connected with Julina C Cartner-Ambrose on 03/24/19 at  9:00 AM EST by a video enabled telemedicine application and verified that I am speaking with the correct person using two identifiers.   I discussed the limitations of evaluation and management by telemedicine and the availability of in person appointments. The patient expressed understanding and agreed to proceed.   I discussed the assessment and treatment plan with the patient. The patient was provided an opportunity to ask questions and all were answered. The patient agreed with the plan and demonstrated an understanding of the instructions.   The patient was advised to call back or seek an in-person evaluation if the symptoms worsen or if the condition fails to improve as anticipated.  I provided 57 minutes of non-face-to-face time during this encounter.   Heidi Dach, LCSW    THERAPIST PROGRESS NOTE  Session Time: 0900  Participation Level: Active  Behavioral Response: NeatAlertAnxious  Type of Therapy: Individual Therapy  Treatment Goals addressed: Coping  Interventions: Supportive  Summary: ALLEA KASSNER is a 39 y.o. female who presents with continued symptoms related to her diagnosis. Yarielys reports doing well since our last session. 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 She reported this has been causing her a lot of anxiety as she has been being paid but feels she is not doing anything to earn the money. She asked the pastor what her job role was at this time, and told him if she was not being useful then she would like to resign. Ultimately, that is what Shaleka ended up doing. LCSW validated Hilary's feelings and held space for her to discuss her feelings around the situation. LCSW encouraged Janayah to brainstorm what she is able to do with her new free time. We also discussed the family dynamics that have  contributed to her stress/anxiety around other situations in her life. LCSW encouraged Zoe to continue taking with her husband so they can begin integrating their family to address these issues that have come up. Magaly expressed understanding and agreement with all information presented.   Suicidal/Homicidal: No  Therapist Response: Naima continues to work towards her tx goals but has not yet reached them. We will continue to work on improving emotional regulation skills and communication moving forward.  Plan: Return again in 2 weeks.  Diagnosis: Axis I: Generalized Anxiety Disorder    Axis II: No diagnosis    Heidi Dach, LCSW 03/24/2019

## 2019-03-31 ENCOUNTER — Other Ambulatory Visit: Payer: Self-pay | Admitting: Psychiatry

## 2019-03-31 DIAGNOSIS — F411 Generalized anxiety disorder: Secondary | ICD-10-CM

## 2019-04-01 ENCOUNTER — Ambulatory Visit (INDEPENDENT_AMBULATORY_CARE_PROVIDER_SITE_OTHER): Payer: PRIVATE HEALTH INSURANCE | Admitting: Psychiatry

## 2019-04-01 ENCOUNTER — Other Ambulatory Visit: Payer: Self-pay

## 2019-04-01 ENCOUNTER — Encounter: Payer: Self-pay | Admitting: Psychiatry

## 2019-04-01 DIAGNOSIS — G4700 Insomnia, unspecified: Secondary | ICD-10-CM

## 2019-04-01 DIAGNOSIS — F411 Generalized anxiety disorder: Secondary | ICD-10-CM

## 2019-04-01 DIAGNOSIS — F0632 Mood disorder due to known physiological condition with major depressive-like episode: Secondary | ICD-10-CM

## 2019-04-01 NOTE — Progress Notes (Signed)
Provider Location : [facility] Patient Location : Home  Virtual Visit via Video Note  I connected with [name] on 04/01/19 at  9:00 AM EST by a video enabled telemedicine application and verified that I am speaking with the correct person using two identifiers.   I discussed the limitations of evaluation and management by telemedicine and the availability of in person appointments. The patient expressed understanding and agreed to proceed.     I discussed the assessment and treatment plan with the patient. The patient was provided an opportunity to ask questions and all were answered. The patient agreed with the plan and demonstrated an understanding of the instructions.   The patient was advised to call back or seek an in-person evaluation if the symptoms worsen or if the condition fails to improve as anticipated.   [facility] MD/PA/NP OP Progress Note  04/01/2019 12:14 PM [name]  MRN:  [id number]  Chief Complaint:  Chief Complaint    Follow-up     HPI: [name] is a 39 year old Caucasian female who has a history of depression, GAD, insomnia, optic neuritis, multiple sclerosis, migraine headaches was evaluated by telemedicine today.  Patient today reports she is currently struggling with back pain and is in physical therapy.  She reports that does make her tired.  She however reports her anxiety symptoms as improving.  She does have social anxiety when she is about to perform in social/public places.  She however has not made use of the propranolol yet.  She is motivated to do so.  She continues to follow-up with her therapist and reports therapy sessions as beneficial.  She reports her heart palpitations at night have improved.  She tries to distract herself by not thinking too much about her problems and tries to relax.  That has been beneficial.  She had an EKG done at her primary care office recently and it came back within normal limits.  I have reviewed  notes per NP Jolene Canady dated 03/15/2019 as well as EKG dated 03/15/2019-reviewed-' EKG within normal limits, patient to continue as needed for psychiatric, heart palpitations likely due to anxiety.'  Visit Diagnosis:    ICD-10-CM   1. 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 GAD (generalized anxiety disorder)  F41.1   3. Insomnia, unspecified type  G47.00     Past Psychiatric History: I have reviewed past psychiatric history from my progress note on 05/21/2018.  Past trials of Zoloft, Adderall, trazodone.  Past Medical History:  Past Medical History:  Diagnosis Date  . Depression   . MS (multiple sclerosis) ([place])   . Multiple sclerosis ([place])    History reviewed. No pertinent surgical history.  Family Psychiatric History: I have reviewed family psychiatric history from my progress note on 05/21/2018.  Family History:  Family History  Problem Relation Age of Onset  . Multiple myeloma Mother   . Multiple sclerosis Mother   . Obesity Sister   . Anxiety disorder Daughter   . Multiple sclerosis Maternal Aunt     Social History: Reviewed social history from my progress note on 05/21/2018. Social History   Socioeconomic History  . Marital status: Married    Spouse name: steven ambrose  . Number of children: 3  . Years of education: Not on file  . Highest education level: Bachelor's degree (e.g., BA, AB, BS)  Occupational History  . Not on file  Tobacco Use  .  Smoking status: Never Smoker  . Smokeless tobacco: Never Used  Substance and Sexual Activity  . Alcohol use: Not Currently    Alcohol/week: 0.0 standard drinks    Comment: occasional/fim  . Drug use: No  . Sexual activity: Yes    Birth control/protection: I.U.D.  Other Topics Concern  . Not on file  Social History Narrative  . Not on file   Social Determinants of Health   Financial Resource Strain: Low Risk   . Difficulty of  Paying Living Expenses: Not hard at all  Food Insecurity: No Food Insecurity  . Worried About Charity fundraiser in the Last Year: Never true  . Ran Out of Food in the Last Year: Never true  Transportation Needs: No Transportation Needs  . Lack of Transportation (Medical): No  . Lack of Transportation (Non-Medical): No  Physical Activity: Inactive  . Days of Exercise per Week: 0 days  . Minutes of Exercise per Session: 0 min  Stress: No Stress Concern Present  . Feeling of Stress : Only a little  Social Connections: Unknown  . Frequency of Communication with Friends and Family: Not on file  . Frequency of Social Gatherings with Friends and Family: Not on file  . Attends Religious Services: More than 4 times per year  . Active Member of Clubs or Organizations: No  . Attends Archivist Meetings: Never  . Marital Status: Married    Allergies:  Allergies  Allergen Reactions  . Flexeril [Cyclobenzaprine]     Drowsy     Metabolic Disorder Labs: No results found for: HGBA1C, MPG No results found for: PROLACTIN Lab Results  Component Value Date   CHOL 152 12/04/2018   TRIG 94 12/04/2018   HDL 52 12/04/2018   CHOLHDL 2.6 12/02/2017   LDLCALC 83 12/04/2018   LDLCALC 63 12/02/2017   Lab Results  Component Value Date   TSH 1.120 12/04/2018   TSH 1.240 09/08/2017    Therapeutic Level Labs: No results found for: LITHIUM No results found for: VALPROATE No components found for:  CBMZ  Current Medications: Current Outpatient Medications  Medication Sig Dispense Refill  . amphetamine-dextroamphetamine (ADDERALL XR) 30 MG 24 hr capsule Take 1 capsule (30 mg total) by mouth daily. 30 capsule 0  . amphetamine-dextroamphetamine (ADDERALL) 10 MG tablet Take one or two po in the aftrernoon (Patient not taking: Reported on 03/03/2019) 60 tablet 0  . baclofen (LIORESAL) 10 MG tablet TAKE 1 TABLET BY MOUTH FOUR TIMES A DAY AS NEEDED FOR MUSCLE SPASMS 360 tablet 3  . buPROPion  (WELLBUTRIN XL) 150 MG 24 hr tablet Take 1 tablet (150 mg total) by mouth daily. 90 tablet 0  . Cholecalciferol (VITAMIN D-1000 MAX ST) 1000 units tablet Take 5,000 Units by mouth daily.     Marland Kitchen dalfampridine 10 MG TB12 One po q12 hours (Patient not taking: Reported on 03/03/2019) 60 tablet 11  . fluticasone (FLONASE) 50 MCG/ACT nasal spray SPRAY 2 SPRAYS INTO EACH NOSTRIL EVERY DAY    . ibuprofen (ADVIL) 800 MG tablet Take 1 tablet (800 mg total) by mouth 3 (three) times daily. 21 tablet 0  . ocrelizumab 600 mg in sodium chloride 0.9 % 500 mL Inject 600 mg into the vein every 6 (six) months.    . propranolol (INDERAL) 10 MG tablet TAKE 1 TABLET (10 MG TOTAL) BY MOUTH DAILY AS NEEDED. FOR SEVERE ANXIETY ATTACK 30 tablet 1  . sertraline (ZOLOFT) 100 MG tablet TAKE 2 TABLETS BY MOUTH EVERY  DAY 180 tablet 2  . topiramate (TOPAMAX) 100 MG tablet TAKE 1 TABLET BY MOUTH EVERYDAY AT BEDTIME 90 tablet 3  . traZODone (DESYREL) 50 MG tablet Take 1.5 tablets (75 mg total) by mouth at bedtime. 135 tablet 0   No current facility-administered medications for this visit.     Musculoskeletal: Strength & Muscle Tone: UTA Gait & Station: normal Patient leans: N/A  Psychiatric Specialty Exam: Review of Systems  Psychiatric/Behavioral: The patient is nervous/anxious.   All other systems reviewed and are negative.   There were no vitals taken for this visit.There is no height or weight on file to calculate BMI.  General Appearance: Casual  Eye Contact:  Fair  Speech:  Clear and Coherent  Volume:  Normal  Mood:  Anxious improving  Affect:  Congruent  Thought Process:  Goal Directed and Descriptions of Associations: Intact  Orientation:  Full (Time, Place, and Person)  Thought Content: Logical   Suicidal Thoughts:  No  Homicidal Thoughts:  No  Memory:  Immediate;   Fair Recent;   Fair Remote;   Fair  Judgement:  Fair  Insight:  Fair  Psychomotor Activity:  Normal  Concentration:  Concentration: Fair  and Attention Span: Fair  Recall:  [employer] of Knowledge: Fair  Language: Fair  Akathisia:  No  Handed:  Right  AIMS (if indicated): UTA  Assets:  Communication Skills Desire for Improvement Housing Social Support  ADL's:  Intact  Cognition: WNL  Sleep:  Fair   Screenings: GAD-7     Office Visit from 12/04/2018 in [facility] Visit from 06/03/2018 in [facility] Visit from 04/03/2018 in [facility] Visit from 12/02/2017 in [facility]  Total GAD-7 Score  11  5  16  2     PHQ2-9     Office Visit from 03/15/2019 in [facility] Visit from 12/04/2018 in [facility] Visit from 06/03/2018 in [facility] Visit from 04/03/2018 in [facility] Visit from 12/02/2017 in [facility]  PHQ-2 Total Score  2  3  2  5  2   PHQ-9 Total Score  13  15  8  18  9        Assessment and Plan: Markel is a 39 year old Caucasian female, married, lives in [place], has a history of depression, anxiety, MS, fatigue, insomnia was evaluated by telemedicine today.  She is biologically predisposed given her family history and MS diagnosis.  She also has psychosocial stressors of chronic health issues the current pandemic.  Patient is currently struggling with anxiety symptoms on and off however reports improvement with medications and therapy.  Plan as noted below.  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 daily Zoloft 200 mg p.o. daily  Insomnia-stable Trazodone 75 mg p.o. nightly  GAD-improving Propranolol 10 mg p.o. daily as needed for anxiety attacks.  Patient has been noncompliant.  Encouraged her to take the medication. Zoloft as prescribed Continue CBT with Ms. Cecilie Lowers   I have reviewed notes per PCP Ms.Canady NP-dated 03/15/2019, EKG dated 03/15/2019 as summarized above.  Follow-up in  clinic in 6 weeks or sooner if needed.    I have spent atleast 20 minutes non face to face with patient today. More than 50 % of the time was spent for preparing to see the patient ( e.g., review of test, records ), obtaining and to review and separately obtained history ,  ordering medications and test ,psychoeducation and supportive psychotherapy and care coordination,as well as documenting clinical information in electronic health record. This note was generated in part or whole with voice recognition software. Voice recognition is usually quite accurate but there are transcription errors that can and very often do occur. I apologize for any typographical errors that were not detected and corrected.      Ursula Alert, MD 04/01/2019, 12:14 PM

## 2019-04-06 ENCOUNTER — Other Ambulatory Visit: Payer: Self-pay

## 2019-04-07 ENCOUNTER — Ambulatory Visit (INDEPENDENT_AMBULATORY_CARE_PROVIDER_SITE_OTHER): Payer: PRIVATE HEALTH INSURANCE | Admitting: Licensed Clinical Social Worker

## 2019-04-07 ENCOUNTER — Other Ambulatory Visit: Payer: Self-pay

## 2019-04-07 ENCOUNTER — Encounter: Payer: Self-pay | Admitting: Licensed Clinical Social Worker

## 2019-04-07 DIAGNOSIS — F411 Generalized anxiety disorder: Secondary | ICD-10-CM

## 2019-04-07 DIAGNOSIS — F0632 Mood disorder due to known physiological condition with major depressive-like episode: Secondary | ICD-10-CM

## 2019-04-07 MED ORDER — AMPHETAMINE-DEXTROAMPHET ER 30 MG PO CP24: 30.0000 mg | ORAL_CAPSULE | Freq: Every day | ORAL | 0 refills | Status: DC

## 2019-04-07 NOTE — Progress Notes (Signed)
Virtual Visit via Video Note  I connected with [name] on 04/07/19 at  9:00 AM EST by a video enabled telemedicine application and verified that I am speaking with the correct person using two identifiers.   I discussed the limitations of evaluation and management by telemedicine and the availability of in person appointments. The patient expressed understanding and agreed to proceed.  I discussed the assessment and treatment plan with the patient. The patient was provided an opportunity to ask questions and all were answered. The patient agreed with the plan and demonstrated an understanding of the instructions.   The patient was advised to call back or seek an in-person evaluation if the symptoms worsen or if the condition fails to improve as anticipated.  I provided 60 minutes of non-face-to-face time during this encounter.   Heidi Dach, LCSW    THERAPIST PROGRESS NOTE  Session Time:0900  Participation Level: Active  Behavioral Response: NeatAlertAnxious and Depressed  Type of Therapy: Individual Therapy  Treatment Goals addressed: Coping  Interventions: Strength-based and Supportive  Summary: [name] is a 39 y.o. female who presents with continued symptoms related to her diagnosis. Akshara reports doing well since our last session, but noted ongoing anxiety and depression around money and her relationship. She reports feeling every dime she makes goes back to paying bills, and she is left with no money to dig herself out of a financial hole. She reports feeling as though she is asking for a handout from her husband each time she has to ask him to assist her in managing debt. LCSW validated [name]'s feelings, and encouraged her to recognize how this is likely a reflection of a dynamic within their relationship, and that she feels less than, while he is the one in control/power. [name] agreed with this statement and agreed she feels isolated in  her problems, and that he is not involved in them. LCSW encouraged [name] to have a conversation with her husband where they discuss how they would like to move forward in their marriage--will it be his and her problems? Or our problems? Our successes? Or his/her successes? [name] expressed understanding and agreement. We discussed what exactly she needs from the interaction, and what outcomes she would be okay with and which she would not. LCSW encouraged [name] to prompt this conversation in a time of low stress, in order to  Promote more success in the conversation, as well as leading with empathy.    Suicidal/Homicidal: No  Therapist Response: [name] continues to work towards her treatment goals but has not yet reached them. We will continue to work on improving emotional regulation skills and distress tolerance moving forward.   Plan: Return again in 2 weeks.  Diagnosis: Axis I: Generalized Anxiety Disorder    Axis II: No diagnosis    Heidi Dach, LCSW 04/07/2019

## 2019-04-13 NOTE — Telephone Encounter (Signed)
[employer] [place] [employer]: [id number] (exp. 04/06/19 to 06/06/19) order sent to [facility]. They will reach out to the patient to schedule.

## 2019-04-13 NOTE — Telephone Encounter (Signed)
I called to check on status of appeal. Spoke with Delton See. Appeal approved for both MRI brain and cervical. He faxed appeal letter approval to [phone]. Auth#[id number]. CPT codes: 97588 and [phone]. Approved 04/06/19-06/06/19. Approval date: 04/05/19. I gave to Irving Burton E to let pt know so she can get scheduled.

## 2019-04-20 ENCOUNTER — Other Ambulatory Visit: Payer: Self-pay

## 2019-04-21 MED ORDER — AMPHETAMINE-DEXTROAMPHETAMINE 10 MG PO TABS: ORAL_TABLET | ORAL | 0 refills | Status: DC

## 2019-04-21 NOTE — Telephone Encounter (Signed)
Pt last seen 02/22/19 and next f/u 02/25/19. I cannot get her to pull up on drug registry. Called [facility]. They have her listed under [name]. Pt last refilled adderall XR 30mg  04/07/19 #30. She has not refilled adderall 10mg  since 2019.

## 2019-04-23 ENCOUNTER — Encounter: Payer: Self-pay | Admitting: Licensed Clinical Social Worker

## 2019-04-23 ENCOUNTER — Other Ambulatory Visit: Payer: Self-pay

## 2019-04-23 ENCOUNTER — Ambulatory Visit (INDEPENDENT_AMBULATORY_CARE_PROVIDER_SITE_OTHER): Payer: PRIVATE HEALTH INSURANCE | Admitting: Licensed Clinical Social Worker

## 2019-04-23 DIAGNOSIS — F411 Generalized anxiety disorder: Secondary | ICD-10-CM

## 2019-04-23 DIAGNOSIS — F0632 Mood disorder due to known physiological condition with major depressive-like episode: Secondary | ICD-10-CM | POA: Diagnosis not present

## 2019-04-23 NOTE — Progress Notes (Signed)
Virtual Visit via Video Note  I connected with [name] on 04/23/19 at  8:00 AM EDT by a video enabled telemedicine application and verified that I am speaking with the correct person using two identifiers.   I discussed the limitations of evaluation and management by telemedicine and the availability of in person appointments. The patient expressed understanding and agreed to proceed.  I discussed the assessment and treatment plan with the patient. The patient was provided an opportunity to ask questions and all were answered. The patient agreed with the plan and demonstrated an understanding of the instructions.   The patient was advised to call back or seek an in-person evaluation if the symptoms worsen or if the condition fails to improve as anticipated.  I provided 60 minutes of non-face-to-face time during this encounter.   Heidi Dach, LCSW    THERAPIST PROGRESS NOTE  Session Time: 0800  Participation Level: Active  Behavioral Response: CasualAlertAnxious  Type of Therapy: Individual Therapy  Treatment Goals addressed: Coping  Interventions: Supportive  Summary: [name] is a 39 y.o. female who presents with continued symptoms related to her diagnosis. [name] reports doing well since our last session, and noted she was able to pay off some of her debt with the most recent stimulus check. She reports she also took her daughter shopping for her first bra, and other things she needed. She reports feeling nervous about telling her husband how much money she spent from the stimulus, and worried he was going to "flip out." LCSW validated [name]'s feelings and encouraged her to recognize this is not actually about money. We discussed how this is ultimately about feeling they are living separately, and are not supporting each other. We discussed how, often, within their marriage [name] does not feel supported either financially or emotionally by her  husband, and that boils down to them living separately. LCSW held space for [name] to discuss her feelings and thoughts around the situation, and what she thought would help. LCSW offered insight where appropriate, and encouraged [name] to think critically about the situation and how to best approach it.   Suicidal/Homicidal: No  Therapist Response: [name] continues to work towards her tx goals but has not yet reached them. We will continue to work on improving emotional regulation skills and distress tolerance moving forward.   Plan: Return again in 2 weeks.  Diagnosis: Axis I: Generalized Anxiety Disorder    Axis II: No diagnosis    Heidi Dach, LCSW 04/23/2019

## 2019-04-29 ENCOUNTER — Other Ambulatory Visit: Payer: Self-pay | Admitting: Psychiatry

## 2019-04-29 DIAGNOSIS — F411 Generalized anxiety disorder: Secondary | ICD-10-CM

## 2019-05-08 ENCOUNTER — Other Ambulatory Visit: Payer: Self-pay

## 2019-05-10 MED ORDER — AMPHETAMINE-DEXTROAMPHET ER 30 MG PO CP24: 30.0000 mg | ORAL_CAPSULE | Freq: Every day | ORAL | 0 refills | Status: DC

## 2019-05-13 ENCOUNTER — Encounter: Payer: Self-pay | Admitting: Licensed Clinical Social Worker

## 2019-05-13 ENCOUNTER — Encounter: Payer: Self-pay | Admitting: Psychiatry

## 2019-05-13 ENCOUNTER — Ambulatory Visit (INDEPENDENT_AMBULATORY_CARE_PROVIDER_SITE_OTHER): Payer: PRIVATE HEALTH INSURANCE | Admitting: Psychiatry

## 2019-05-13 ENCOUNTER — Ambulatory Visit (INDEPENDENT_AMBULATORY_CARE_PROVIDER_SITE_OTHER): Payer: PRIVATE HEALTH INSURANCE | Admitting: Licensed Clinical Social Worker

## 2019-05-13 ENCOUNTER — Other Ambulatory Visit: Payer: Self-pay

## 2019-05-13 DIAGNOSIS — F411 Generalized anxiety disorder: Secondary | ICD-10-CM | POA: Diagnosis not present

## 2019-05-13 DIAGNOSIS — G4700 Insomnia, unspecified: Secondary | ICD-10-CM | POA: Diagnosis not present

## 2019-05-13 DIAGNOSIS — F0632 Mood disorder due to known physiological condition with major depressive-like episode: Secondary | ICD-10-CM

## 2019-05-13 MED ORDER — BUPROPION HCL ER (XL) 150 MG PO TB24: 150.0000 mg | ORAL_TABLET | Freq: Every day | ORAL | 0 refills | Status: DC

## 2019-05-13 MED ORDER — TRAZODONE HCL 50 MG PO TABS: 75.0000 mg | ORAL_TABLET | Freq: Every day | ORAL | 0 refills | Status: DC

## 2019-05-13 NOTE — Patient Instructions (Signed)
Follow-up in clinic on May 20 at 10:30 AM

## 2019-05-13 NOTE — Progress Notes (Signed)
Provider Location : [facility] Patient Location : Home  Virtual Visit via Video Note  I connected with [name] on 05/13/19 at 10:20 AM EDT by a video enabled telemedicine application and verified that I am speaking with the correct person using two identifiers.   I discussed the limitations of evaluation and management by telemedicine and the availability of in person appointments. The patient expressed understanding and agreed to proceed.    I discussed the assessment and treatment plan with the patient. The patient was provided an opportunity to ask questions and all were answered. The patient agreed with the plan and demonstrated an understanding of the instructions.   The patient was advised to call back or seek an in-person evaluation if the symptoms worsen or if the condition fails to improve as anticipated.  [place] MD OP Progress Note  05/13/2019 10:55 AM [name]  MRN:  [id number]  Chief Complaint:  Chief Complaint    Follow-up     HPI: [name] is a 39 year old Caucasian female who has a history of depression, GAD, insomnia, optic neuritis, multiple sclerosis, migraine headaches was evaluated by telemedicine today.  Patient today reports she is currently doing well on the current medication regimen.  She reports she is compliant on medications and denies side effects.  She reports sleep overall is okay.  There has been couple of nights here and there when she had some sleep restlessness however she reports that does not happen all the time.  Patient reports she has joined a DBT music therapist class.  She reports it is a year long course and she is enjoying it.  She reports she is trying to use some of those techniques in her own life which definitely helps her.  She reports she had an appointment with her therapist recently.  Her therapist also is happy with her progress.  Patient denies any suicidality, homicidality or perceptual  disturbances.  Patient denies any other concerns today. Visit Diagnosis:    ICD-10-CM   1. 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 GAD (generalized anxiety disorder)  F41.1   3. Insomnia, unspecified type  G47.00 traZODone (DESYREL) 50 MG tablet    Past Psychiatric History: I have reviewed past psychiatric history from my progress note on 05/21/2018.  Past trials of Zoloft, Adderall, trazodone  Past Medical History:  Past Medical History:  Diagnosis Date  . Depression   . MS (multiple sclerosis) ([place])   . Multiple sclerosis ([place])    History reviewed. No pertinent surgical history.  Family Psychiatric History: I have reviewed family psychiatric history from my progress note on 05/21/2018.  Family History:  Family History  Problem Relation Age of Onset  . Multiple myeloma Mother   . Multiple sclerosis Mother   . Obesity Sister   . Anxiety disorder Daughter   . Multiple sclerosis Maternal Aunt     Social History: I have reviewed social history from my progress note on 05/21/2018. Social History   Socioeconomic History  . Marital status: Married    Spouse name: steven ambrose  . Number of children: 3  . Years of education: Not on file  . Highest education level: Bachelor's degree (e.g., BA, AB, BS)  Occupational History  . Not on file  Tobacco Use  . Smoking status: Never Smoker  . Smokeless tobacco: Never Used  Substance and Sexual Activity  .  Alcohol use: Not Currently    Alcohol/week: 0.0 standard drinks    Comment: occasional/fim  . Drug use: No  . Sexual activity: Yes    Birth control/protection: I.U.D.  Other Topics Concern  . Not on file  Social History Narrative  . Not on file   Social Determinants of Health   Financial Resource Strain: Low Risk   . Difficulty of Paying Living Expenses: Not hard at all  Food Insecurity: No Food  Insecurity  . Worried About Charity fundraiser in the Last Year: Never true  . Ran Out of Food in the Last Year: Never true  Transportation Needs: No Transportation Needs  . Lack of Transportation (Medical): No  . Lack of Transportation (Non-Medical): No  Physical Activity: Inactive  . Days of Exercise per Week: 0 days  . Minutes of Exercise per Session: 0 min  Stress: No Stress Concern Present  . Feeling of Stress : Only a little  Social Connections: Unknown  . Frequency of Communication with Friends and Family: Not on file  . Frequency of Social Gatherings with Friends and Family: Not on file  . Attends Religious Services: More than 4 times per year  . Active Member of Clubs or Organizations: No  . Attends Archivist Meetings: Never  . Marital Status: Married    Allergies:  Allergies  Allergen Reactions  . Flexeril [Cyclobenzaprine]     Drowsy     Metabolic Disorder Labs: No results found for: HGBA1C, MPG No results found for: PROLACTIN Lab Results  Component Value Date   CHOL 152 12/04/2018   TRIG 94 12/04/2018   HDL 52 12/04/2018   CHOLHDL 2.6 12/02/2017   LDLCALC 83 12/04/2018   LDLCALC 63 12/02/2017   Lab Results  Component Value Date   TSH 1.120 12/04/2018   TSH 1.240 09/08/2017    Therapeutic Level Labs: No results found for: LITHIUM No results found for: VALPROATE No components found for:  CBMZ  Current Medications: Current Outpatient Medications  Medication Sig Dispense Refill  . amphetamine-dextroamphetamine (ADDERALL XR) 30 MG 24 hr capsule Take 1 capsule (30 mg total) by mouth daily. 30 capsule 0  . amphetamine-dextroamphetamine (ADDERALL) 10 MG tablet Take one or two po in the aftrernoon 60 tablet 0  . baclofen (LIORESAL) 10 MG tablet TAKE 1 TABLET BY MOUTH FOUR TIMES A DAY AS NEEDED FOR MUSCLE SPASMS 360 tablet 3  . buPROPion (WELLBUTRIN XL) 150 MG 24 hr tablet Take 1 tablet (150 mg total) by mouth daily. 90 tablet 0  .  Cholecalciferol (VITAMIN D-1000 MAX ST) 1000 units tablet Take 5,000 Units by mouth daily.     Marland Kitchen dalfampridine 10 MG TB12 One po q12 hours (Patient not taking: Reported on 03/03/2019) 60 tablet 11  . fluticasone (FLONASE) 50 MCG/ACT nasal spray SPRAY 2 SPRAYS INTO EACH NOSTRIL EVERY DAY    . ibuprofen (ADVIL) 800 MG tablet Take 1 tablet (800 mg total) by mouth 3 (three) times daily. 21 tablet 0  . ocrelizumab 600 mg in sodium chloride 0.9 % 500 mL Inject 600 mg into the vein every 6 (six) months.    . propranolol (INDERAL) 10 MG tablet TAKE 1 TABLET (10 MG TOTAL) BY MOUTH DAILY AS NEEDED. FOR SEVERE ANXIETY ATTACK 30 tablet 1  . sertraline (ZOLOFT) 100 MG tablet TAKE 2 TABLETS BY MOUTH EVERY DAY 180 tablet 2  . topiramate (TOPAMAX) 100 MG tablet TAKE 1 TABLET BY MOUTH EVERYDAY AT BEDTIME 90 tablet 3  .  traZODone (DESYREL) 50 MG tablet Take 1.5 tablets (75 mg total) by mouth at bedtime. 135 tablet 0   No current facility-administered medications for this visit.     Musculoskeletal: Strength & Muscle Tone: UTA Gait & Station: normal Patient leans: N/A  Psychiatric Specialty Exam: Review of Systems  Psychiatric/Behavioral: Negative for agitation, behavioral problems, confusion, decreased concentration, dysphoric mood, hallucinations, self-injury, sleep disturbance and suicidal ideas. The patient is not nervous/anxious and is not hyperactive.   All other systems reviewed and are negative.   There were no vitals taken for this visit.There is no height or weight on file to calculate BMI.  General Appearance: Casual  Eye Contact:  Fair  Speech:  Clear and Coherent  Volume:  Normal  Mood:  Euthymic  Affect:  Appropriate  Thought Process:  Goal Directed and Descriptions of Associations: Intact  Orientation:  Full (Time, Place, and Person)  Thought Content: Logical   Suicidal Thoughts:  No  Homicidal Thoughts:  No  Memory:  Immediate;   Fair Recent;   Fair Remote;   Fair  Judgement:  Fair   Insight:  Good  Psychomotor Activity:  Normal  Concentration:  Concentration: Fair and Attention Span: Fair  Recall:  [employer] of Knowledge: Fair  Language: Fair  Akathisia:  No  Handed:  Right  AIMS (if indicated): UTA  Assets:  Communication Skills Desire for Improvement Housing Social Support  ADL's:  Intact  Cognition: WNL  Sleep:  Fair   Screenings: GAD-7     Office Visit from 12/04/2018 in [facility] Visit from 06/03/2018 in [facility] Visit from 04/03/2018 in [facility] Visit from 12/02/2017 in [facility]  Total GAD-7 Score  11  5  16  2     PHQ2-9     Office Visit from 03/15/2019 in [facility] Visit from 12/04/2018 in [facility] Visit from 06/03/2018 in [facility] Visit from 04/03/2018 in [facility] Visit from 12/02/2017 in [facility]  PHQ-2 Total Score  2  3  2  5  2   PHQ-9 Total Score  13  15  8  18  9        Assessment and Plan: Ledora is a 39 year old Caucasian female, married, lives in [place], has a history of depression, anxiety, MS, fatigue, insomnia was evaluated by telemedicine today.  She is biologically predisposed given her family history and MS diagnosis.  She also has psychosocial stressors of chronic health issues the current pandemic.  Patient is currently making progress.  Plan as noted below.  Plan Depressive disorder secondary to medical condition-MS-stable Wellbutrin XL 150 mg p.o. daily Zoloft 200 mg p.o. daily  Insomnia-stable Trazodone 75 mg p.o. nightly  GAD-stable Propranolol 10 mg p.o. daily as needed for anxiety attacks Zoloft as prescribed  Patient reports she is currently doing much better.  Her current therapist is leaving the practice and she was advised to establish care with another therapist as needed.  Follow-up in clinic in 6 to 8 weeks or sooner if  needed.  I have spent atleast 20 minutes non face to face with patient today. More than 50 % of the time was spent for preparing to see the patient ( e.g., review of test, records ), ordering medications and test ,psychoeducation and supportive psychotherapy and care coordination,as well as documenting clinical information in electronic health record. This note was generated in part or whole with  voice recognition software. Voice recognition is usually quite accurate but there are transcription errors that can and very often do occur. I apologize for any typographical errors that were not detected and corrected.       Ursula Alert, MD 05/13/2019, 10:55 AM

## 2019-05-13 NOTE — Progress Notes (Signed)
Virtual Visit via Video Note  I connected with [name] on 05/13/19 at  9:00 AM EDT by a video enabled telemedicine application and verified that I am speaking with the correct person using two identifiers.   I discussed the limitations of evaluation and management by telemedicine and the availability of in person appointments. The patient expressed understanding and agreed to proceed.    I discussed the assessment and treatment plan with the patient. The patient was provided an opportunity to ask questions and all were answered. The patient agreed with the plan and demonstrated an understanding of the instructions.   The patient was advised to call back or seek an in-person evaluation if the symptoms worsen or if the condition fails to improve as anticipated.  I provided 60 minutes of non-face-to-face time during this encounter.   Heidi Dach, LCSW    THERAPIST PROGRESS NOTE  Session Time: 0900  Participation Level: Active  Behavioral Response: CasualAlertAnxious  Type of Therapy: Individual Therapy  Treatment Goals addressed: Coping  Interventions: CBT  Summary: [name] is a 39 y.o. female who presents with continued symptoms related to her diagnosis. [name] reports doing well since our last session. She reports some things have been more difficult than others recently, "I got an email from the counselor at school, and both my kids are failing." LCSW validated [name]'s feelings and held space for her to discuss her feelings/thoguhts around the situation. LCSW encouraged [name] to recognize she cannot fix these issues for her teenage children, and they will likely have to manage the consequences of their actions moving forward. We discussed how to be supportive without enabling behaviors as well. [name] reported feeling reluctant to care more about their school work than they do. LCSW validated these feelings and encouraged [name] to talk to  her children and her husband and attempt to increase their motivation in some way, but also to recognize again that she cannot do this for them. [name] was able to hear this and expressed agreement.   Suicidal/Homicidal: No  Therapist Response: [name] continues to work towards her tx goals but has not yet reached them. We will continue to work on improving emotional regulation skills and distress tolerance moving forward.   Plan: Return again in 2 weeks.  Diagnosis: Axis I: Generalized Anxiety Disorder    Axis II: No diagnosis    Heidi Dach, LCSW 05/13/2019

## 2019-05-27 ENCOUNTER — Ambulatory Visit: Payer: PRIVATE HEALTH INSURANCE | Admitting: Licensed Clinical Social Worker

## 2019-06-04 ENCOUNTER — Other Ambulatory Visit: Payer: Self-pay

## 2019-06-04 ENCOUNTER — Ambulatory Visit (INDEPENDENT_AMBULATORY_CARE_PROVIDER_SITE_OTHER): Payer: PRIVATE HEALTH INSURANCE | Admitting: Nurse Practitioner

## 2019-06-04 ENCOUNTER — Encounter: Payer: Self-pay | Admitting: Nurse Practitioner

## 2019-06-04 VITALS — BP 94/65 | HR 86 | Temp 98.2°F | Ht 63.8 in | Wt 190.6 lb

## 2019-06-04 DIAGNOSIS — F418 Other specified anxiety disorders: Secondary | ICD-10-CM

## 2019-06-04 DIAGNOSIS — G4700 Insomnia, unspecified: Secondary | ICD-10-CM | POA: Diagnosis not present

## 2019-06-04 DIAGNOSIS — E669 Obesity, unspecified: Secondary | ICD-10-CM | POA: Insufficient documentation

## 2019-06-04 DIAGNOSIS — N644 Mastodynia: Secondary | ICD-10-CM

## 2019-06-04 DIAGNOSIS — G35 Multiple sclerosis: Secondary | ICD-10-CM

## 2019-06-04 DIAGNOSIS — E6609 Other obesity due to excess calories: Secondary | ICD-10-CM

## 2019-06-04 DIAGNOSIS — G43009 Migraine without aura, not intractable, without status migrainosus: Secondary | ICD-10-CM | POA: Diagnosis not present

## 2019-06-04 DIAGNOSIS — Z6832 Body mass index (BMI) 32.0-32.9, adult: Secondary | ICD-10-CM

## 2019-06-04 NOTE — Progress Notes (Signed)
BP 94/65   Pulse 86   Temp 98.2 F (36.8 C) (Oral)   Ht 5' 3.8" (1.621 m)   Wt 190 lb 9.6 oz (86.5 kg)   SpO2 98%   BMI 32.92 kg/m    Subjective:    Patient ID: [name], female    DOB: [birth year], 39 y.o.   MRN: [id number]  HPI: [name] is a 39 y.o. female  Chief Complaint  Patient presents with  . Depression  . Migraine   DEPRESSION Followed by psychiatry at [facility], Dr. Shea Evans.  Last seen 05/13/19. Dr. Shea Evans started Propranolol 10 MG as needed at previous visit, she reports this does help.  Denies any N&V, diaphoresis, CP, SOB with palpitations.  Patient also is followed by therapist and last saw 05/13/19. Continues on Sertraline, Wellbutrin, Adderall, and Trazodone. Mood status: stable Satisfied with current treatment?: yes Symptom severity: mild  Duration of current treatment : chronic Side effects: no Medication compliance: good compliance Psychotherapy/counseling: yes current Depressed mood: yes Anxious mood: no Anhedonia: no Significant weight loss or gain: no Insomnia: no hard to fall asleep Fatigue: no Feelings of worthlessness or guilt: no Impaired concentration/indecisiveness: no Suicidal ideations: no Hopelessness: no Crying spells: no Depression screen [facility] 2/9 06/04/2019 03/15/2019 12/04/2018 06/03/2018 04/03/2018  Decreased Interest 1 1 1 2 3   Down, Depressed, Hopeless 1 1 2  0 2  PHQ - 2 Score 2 2 3 2 5   Altered sleeping 2 2 1  0 1  Tired, decreased energy 3 3 3 3 3   Change in appetite 3 3 3  0 3  Feeling bad or failure about yourself  0 2 3 2 3   Trouble concentrating 2 1 2 1 3   Moving slowly or fidgety/restless 0 0 0 0 0  Suicidal thoughts 0 0 0 0 0  PHQ-9 Score 12 13 15 8 18   Difficult doing work/chores Somewhat difficult Somewhat difficult Somewhat difficult Not difficult at all Somewhat difficult   MIGRAINES Continues on Topamax and is followed by neurology for this and her MS. Headache status at time of visit:  asymptomatic Treatments attempted: Treatments attempted: none, triptans and topamax   Aura: yes Nausea:  yes Vomiting: no Photophobia:  no Phonophobia:  no Effect on social functioning:  no Numbers of missed days of school/work each month: 0 Confusion:  no Gait disturbance/ataxia:  no Behavioral changes:  no Fevers:  no   BREAST PAIN Has been having pain to right breast behind nipple and at times left breast, although R>L.  If rubs up against nipple it hurts.  Has not had menstrual cycle in several years. Duration :weeks Location: bilateral Onset: gradual Severity: 3/10 Quality: dull and aching Frequency: intermittent Redness: no Swelling: no Trauma: no trauma Breastfeeding: no Associated with menstral cycle: no Nipple discharge: no Breast lump: no Status: fluctuating Treatments attempted: none Previous mammogram: yes  Relevant past medical, surgical, family and social history reviewed and updated as indicated. Interim medical history since our last visit reviewed. Allergies and medications reviewed and updated.  Review of Systems  Constitutional: Negative for activity change, appetite change, diaphoresis, fatigue and fever.  Respiratory: Negative for cough, chest tightness and shortness of breath.   Cardiovascular: Negative for chest pain, palpitations and leg swelling.  Gastrointestinal: Negative.   Neurological: Negative.   Psychiatric/Behavioral: Negative.     Per HPI unless specifically indicated above     Objective:    BP 94/65   Pulse 86   Temp 98.2 F (36.8 C) (Oral)  Ht 5' 3.8" (1.621 m)   Wt 190 lb 9.6 oz (86.5 kg)   SpO2 98%   BMI 32.92 kg/m   Wt Readings from Last 3 Encounters:  06/04/19 190 lb 9.6 oz (86.5 kg)  03/03/19 184 lb (83.5 kg)  02/22/19 181 lb 8 oz (82.3 kg)    Physical Exam Vitals and nursing note reviewed.  Constitutional:      General: She is awake. She is not in acute distress.    Appearance: She is well-developed. She is  obese. She is not ill-appearing.  HENT:     Head: Normocephalic.     Right Ear: Hearing normal.     Left Ear: Hearing normal.     Nose: Nose normal.  Eyes:     General: Lids are normal.        Right eye: No discharge.        Left eye: No discharge.     Conjunctiva/sclera: Conjunctivae normal.     Pupils: Pupils are equal, round, and reactive to light.  Cardiovascular:     Rate and Rhythm: Normal rate and regular rhythm.     Heart sounds: Normal heart sounds. No murmur. No gallop.   Pulmonary:     Effort: Pulmonary effort is normal. No accessory muscle usage or respiratory distress.     Breath sounds: Normal breath sounds.  Chest:     Breasts:        Right: Normal.        Left: Normal.     Comments: Tenderness reports on palpation of bilateral nipple area, no masses noted. Abdominal:     General: Bowel sounds are normal.     Palpations: Abdomen is soft.  Musculoskeletal:     Cervical back: Normal range of motion and neck supple.     Right lower leg: No edema.     Left lower leg: No edema.  Lymphadenopathy:     Upper Body:     Right upper body: No supraclavicular, axillary or pectoral adenopathy.     Left upper body: No supraclavicular, axillary or pectoral adenopathy.  Skin:    General: Skin is warm and dry.  Neurological:     Mental Status: She is alert and oriented to person, place, and time.  Psychiatric:        Attention and Perception: Attention normal.        Mood and Affect: Mood normal.        Behavior: Behavior normal. Behavior is cooperative.        Thought Content: Thought content normal.        Judgment: Judgment normal.    Results for orders placed or performed in visit on 12/04/18  CBC with Differential/Platelet  Result Value Ref Range   WBC 7.1 3.4 - 10.8 x10E3/uL   RBC 4.68 3.77 - 5.28 x10E6/uL   Hemoglobin 13.5 11.1 - 15.9 g/dL   Hematocrit 95.2 84.1 - 46.6 %   MCV 86 79 - 97 fL   MCH 28.8 26.6 - 33.0 pg   MCHC 33.6 31.5 - 35.7 g/dL   RDW 32.4  40.1 - 02.7 %   Platelets 348 150 - 450 x10E3/uL   Neutrophils 75 Not Estab. %   Lymphs 14 Not Estab. %   Monocytes 7 Not Estab. %   Eos 3 Not Estab. %   Basos 1 Not Estab. %   Neutrophils Absolute 5.4 1.4 - 7.0 x10E3/uL   Lymphocytes Absolute 1.0 0.7 - 3.1 x10E3/uL   Monocytes  Absolute 0.5 0.1 - 0.9 x10E3/uL   EOS (ABSOLUTE) 0.2 0.0 - 0.4 x10E3/uL   Basophils Absolute 0.0 0.0 - 0.2 x10E3/uL   Immature Granulocytes 0 Not Estab. %   Immature Grans (Abs) 0.0 0.0 - 0.1 x10E3/uL  Comprehensive metabolic panel  Result Value Ref Range   Glucose 58 (L) 65 - 99 mg/dL   BUN 10 6 - 24 mg/dL   Creatinine, Ser 7.35 0.57 - 1.00 mg/dL   GFR calc non Af Amer 97 >59 mL/min/1.73   GFR calc Af Amer 112 >59 mL/min/1.73   BUN/Creatinine Ratio 13 9 - 23   Sodium 140 134 - 144 mmol/L   Potassium 3.7 3.5 - 5.2 mmol/L   Chloride 105 96 - 106 mmol/L   CO2 22 20 - 29 mmol/L   Calcium 8.9 8.7 - 10.2 mg/dL   Total Protein 6.3 6.0 - 8.5 g/dL   Albumin 4.2 3.8 - 4.8 g/dL   Globulin, Total 2.1 1.5 - 4.5 g/dL   Albumin/Globulin Ratio 2.0 1.2 - 2.2   Bilirubin Total <0.2 0.0 - 1.2 mg/dL   Alkaline Phosphatase 106 39 - 117 IU/L   AST 11 0 - 40 IU/L   ALT 9 0 - 32 IU/L  Lipid Panel w/o Chol/HDL Ratio  Result Value Ref Range   Cholesterol, Total 152 100 - 199 mg/dL   Triglycerides 94 0 - 149 mg/dL   HDL 52 >32 mg/dL   VLDL Cholesterol Cal 17 5 - 40 mg/dL   LDL Chol Calc (NIH) 83 0 - 99 mg/dL  TSH  Result Value Ref Range   TSH 1.120 0.450 - 4.500 uIU/mL  VITAMIN D 25 Hydroxy (Vit-D Deficiency, Fractures)  Result Value Ref Range   Vit D, 25-Hydroxy 46.0 30.0 - 100.0 ng/mL      Assessment & Plan:   Problem List Items Addressed This Visit      Cardiovascular and Mediastinum   Headache, migraine    Chronic, ongoing and stable with Topamax.  Continue collaboration with neurology and recent notes reviewed.        Nervous and Auditory   Relapsing remitting multiple sclerosis (HCC) - Primary     Chronic, ongoing.  Continue to collaborate with neurology and current treatment.        Other   Depression with anxiety    Chronic, ongoing  Denies SI/HI.  Continue collaboration with psychiatry and psychology + current medication regimen as prescribed by them.  Return to office in 6 months or sooner if worsening symptoms.      Insomnia    Chronic, ongoing secondary to MS.  Continue Trazodone and collaboration with psychiatry and therapy.        Obesity    Recommended eating smaller high protein, low fat meals more frequently and exercising 30 mins a day 5 times a week with a goal of 10-15lb weight loss in the next 3 months. Patient voiced their understanding and motivation to adhere to these recommendations.        Other Visit Diagnoses    Breast pain       Acute, normal exam.  Has not had mammogram in some time.  Will order imaging.   Relevant Orders   MM Digital Diagnostic Bilat       Follow up plan: Return in about 6 months (around 12/04/2019) for Annual Physical.

## 2019-06-04 NOTE — Patient Instructions (Addendum)
[facility] at [facility]  Address: [address] [place], [place], [place] [address]  Phone: [phone]  Chest Wall Pain Chest wall pain is pain in or around the bones and muscles of your chest. Chest wall pain may be caused by:  An injury.  Coughing a lot.  Using your chest and arm muscles too much. Sometimes, the cause may not be known. This pain may take a few weeks or longer to get better. Follow these instructions at home: Managing pain, stiffness, and swelling If told, put ice on the painful area:  Put ice in a plastic bag.  Place a towel between your skin and the bag.  Leave the ice on for 20 minutes, 2-3 times a day.  Activity  Rest as told by your doctor.  Avoid doing things that cause pain. This includes lifting heavy items.  Ask your doctor what activities are safe for you. General instructions   Take over-the-counter and prescription medicines only as told by your doctor.  Do not use any products that contain nicotine or tobacco, such as cigarettes, e-cigarettes, and chewing tobacco. If you need help quitting, ask your doctor.  Keep all follow-up visits as told by your doctor. This is important. Contact a doctor if:  You have a fever.  Your chest pain gets worse.  You have new symptoms. Get help right away if:  You feel sick to your stomach (nauseous) or you throw up (vomit).  You feel sweaty or light-headed.  You have a cough with mucus from your lungs (sputum) or you cough up blood.  You are short of breath. These symptoms may be an emergency. Do not wait to see if the symptoms will go away. Get medical help right away. Call your local emergency services (911 in the [place] Do not drive yourself to the hospital. Summary  Chest wall pain is pain in or around the bones and muscles of your chest.  It may be treated with ice, rest, and medicines. Your condition may also get better if you avoid doing things that cause  pain.  Contact a doctor if you have a fever, chest pain that gets worse, or new symptoms.  Get help right away if you feel light-headed or you get short of breath. These symptoms may be an emergency. This information is not intended to replace advice given to you by your health care provider. Make sure you discuss any questions you have with your health care provider. Document Revised: 07/24/2017 Document Reviewed: 07/24/2017 Elsevier Patient Education  2020 [employer].

## 2019-06-04 NOTE — Assessment & Plan Note (Signed)
Chronic, ongoing secondary to MS.  Continue Trazodone and collaboration with psychiatry and therapy.

## 2019-06-04 NOTE — Assessment & Plan Note (Signed)
Chronic, ongoing.  Denies SI/HI.  Continue collaboration with psychiatry and psychology + current medication regimen as prescribed by them.  Return to office in 6 months or sooner if worsening symptoms. 

## 2019-06-04 NOTE — Assessment & Plan Note (Signed)
Recommended eating smaller high protein, low fat meals more frequently and exercising 30 mins a day 5 times a week with a goal of 10-15lb weight loss in the next 3 months. Patient voiced their understanding and motivation to adhere to these recommendations.  

## 2019-06-04 NOTE — Assessment & Plan Note (Signed)
Chronic, ongoing.  Continue to collaborate with neurology and current treatment. °

## 2019-06-04 NOTE — Assessment & Plan Note (Signed)
Chronic, ongoing and stable with Topamax.  Continue collaboration with neurology and recent notes reviewed.

## 2019-06-07 ENCOUNTER — Other Ambulatory Visit: Payer: Self-pay | Admitting: Neurology

## 2019-06-08 MED ORDER — AMPHETAMINE-DEXTROAMPHET ER 30 MG PO CP24: 30.0000 mg | ORAL_CAPSULE | Freq: Every day | ORAL | 0 refills | Status: DC

## 2019-06-08 NOTE — Telephone Encounter (Signed)
Pt is due for a refill on her adderall 30mg . Pt is up to date on her appts. [place] Controlled Substance Registry checked and is appropriate.

## 2019-06-24 ENCOUNTER — Telehealth (INDEPENDENT_AMBULATORY_CARE_PROVIDER_SITE_OTHER): Payer: PRIVATE HEALTH INSURANCE | Admitting: Psychiatry

## 2019-06-24 ENCOUNTER — Encounter: Payer: Self-pay | Admitting: Psychiatry

## 2019-06-24 ENCOUNTER — Other Ambulatory Visit: Payer: Self-pay

## 2019-06-24 DIAGNOSIS — F411 Generalized anxiety disorder: Secondary | ICD-10-CM | POA: Diagnosis not present

## 2019-06-24 DIAGNOSIS — F0632 Mood disorder due to known physiological condition with major depressive-like episode: Secondary | ICD-10-CM

## 2019-06-24 DIAGNOSIS — G4700 Insomnia, unspecified: Secondary | ICD-10-CM | POA: Diagnosis not present

## 2019-06-24 NOTE — Progress Notes (Signed)
Provider Location : [facility] Patient Location : Home  Virtual Visit via Video Note  I connected with [name] on 06/24/19 at 10:30 AM EDT by a video enabled telemedicine application and verified that I am speaking with the correct person using two identifiers.   I discussed the limitations of evaluation and management by telemedicine and the availability of in person appointments. The patient expressed understanding and agreed to proceed.    I discussed the assessment and treatment plan with the patient. The patient was provided an opportunity to ask questions and all were answered. The patient agreed with the plan and demonstrated an understanding of the instructions.   The patient was advised to call back or seek an in-person evaluation if the symptoms worsen or if the condition fails to improve as anticipated.   BH MD OP Progress Note  06/24/2019 12:20 PM [name]  MRN:  [id number]  Chief Complaint:  Chief Complaint    Follow-up     HPI: [name] is a 39-year-old Caucasian female who has a history of depression, GAD, insomnia, optic neuritis, multiple sclerosis, migraine headaches was evaluated by telemedicine today.  Patient today reports she continues to do well with regards to her mood symptoms.  She denies any significant depression.  She does have anxiety symptoms on and off however she has been coping with it.  She is currently in DBT training as a music therapist.  She reports she recently had an anxiety attack and she was able to use some of the techniques that she learned in her DBT class.  That helped.  She reports sleep as good.  Patient denies any suicidality, homicidality or perceptual disturbances.  She is compliant on medications as prescribed and denies side effects.  Patient denies any other concerns today.  Visit Diagnosis:    ICD-10-CM   1. 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 GAD (generalized anxiety disorder)  F41.1   3. Insomnia, unspecified type  G47.00     Past Psychiatric History: I have reviewed past psychiatric history from my progress note on 05/21/2018.  Past trials of Zoloft, Adderall, trazodone.  Past Medical History:  Past Medical History:  Diagnosis Date  . Depression   . MS (multiple sclerosis) (HCC)   . Multiple sclerosis (HCC)    History reviewed. No pertinent surgical history.  Family Psychiatric History: I have reviewed family psychiatric history from my progress note on 05/21/2018.  Family History:  Family History  Problem Relation Age of Onset  . Multiple myeloma Mother   . Multiple sclerosis Mother   . Obesity Sister   . Anxiety disorder Daughter   . Multiple sclerosis Maternal Aunt     Social History: I have reviewed social history from my progress note on 05/21/2018. Social History   Socioeconomic History  . Marital status: Married    Spouse name: steven ambrose  . Number of children: 3  . Years of education: Not on file  . Highest education level: Bachelor's degree (e.g., BA, AB, BS)  Occupational History  . Not on file  Tobacco Use  . Smoking status: Never Smoker  . Smokeless tobacco: Never Used  Substance and Sexual Activity  . Alcohol use: Not Currently    Alcohol/week: 0.0 standard drinks    Comment: occasional/fim  . Drug use: No  . Sexual activity: Yes    Birth control/protection: I.U.D.  Other Topics   Concern  . Not on file  Social History Narrative  . Not on file   Social Determinants of Health   Financial Resource Strain:   . Difficulty of Paying Living Expenses:   Food Insecurity:   . Worried About Running Out of Food in the Last Year:   . Ran Out of Food in the Last Year:   Transportation Needs:   . Lack of Transportation (Medical):   . Lack of Transportation (Non-Medical):   Physical Activity:   . Days of Exercise per Week:   . Minutes  of Exercise per Session:   Stress:   . Feeling of Stress :   Social Connections:   . Frequency of Communication with Friends and Family:   . Frequency of Social Gatherings with Friends and Family:   . Attends Religious Services:   . Active Member of Clubs or Organizations:   . Attends Club or Organization Meetings:   . Marital Status:     Allergies:  Allergies  Allergen Reactions  . Flexeril [Cyclobenzaprine]     Drowsy     Metabolic Disorder Labs: No results found for: HGBA1C, MPG No results found for: PROLACTIN Lab Results  Component Value Date   CHOL 152 12/04/2018   TRIG 94 12/04/2018   HDL 52 12/04/2018   CHOLHDL 2.6 12/02/2017   LDLCALC 83 12/04/2018   LDLCALC 63 12/02/2017   Lab Results  Component Value Date   TSH 1.120 12/04/2018   TSH 1.240 09/08/2017    Therapeutic Level Labs: No results found for: LITHIUM No results found for: VALPROATE No components found for:  CBMZ  Current Medications: Current Outpatient Medications  Medication Sig Dispense Refill  . amphetamine-dextroamphetamine (ADDERALL XR) 30 MG 24 hr capsule Take 1 capsule (30 mg total) by mouth daily. 30 capsule 0  . amphetamine-dextroamphetamine (ADDERALL) 10 MG tablet Take one or two po in the aftrernoon 60 tablet 0  . baclofen (LIORESAL) 10 MG tablet TAKE 1 TABLET BY MOUTH FOUR TIMES A DAY AS NEEDED FOR MUSCLE SPASMS 360 tablet 3  . buPROPion (WELLBUTRIN XL) 150 MG 24 hr tablet Take 1 tablet (150 mg total) by mouth daily. 90 tablet 0  . Cholecalciferol (VITAMIN D-1000 MAX ST) 1000 units tablet Take 5,000 Units by mouth daily.     . fluticasone (FLONASE) 50 MCG/ACT nasal spray SPRAY 2 SPRAYS INTO EACH NOSTRIL EVERY DAY    . ibuprofen (ADVIL) 800 MG tablet Take 1 tablet (800 mg total) by mouth 3 (three) times daily. 21 tablet 0  . ocrelizumab 600 mg in sodium chloride 0.9 % 500 mL Inject 600 mg into the vein every 6 (six) months.    . propranolol (INDERAL) 10 MG tablet TAKE 1 TABLET (10 MG  TOTAL) BY MOUTH DAILY AS NEEDED. FOR SEVERE ANXIETY ATTACK 30 tablet 1  . sertraline (ZOLOFT) 100 MG tablet TAKE 2 TABLETS BY MOUTH EVERY DAY 180 tablet 2  . topiramate (TOPAMAX) 100 MG tablet TAKE 1 TABLET BY MOUTH EVERYDAY AT BEDTIME 90 tablet 3  . traZODone (DESYREL) 50 MG tablet Take 1.5 tablets (75 mg total) by mouth at bedtime. 135 tablet 0   No current facility-administered medications for this visit.     Musculoskeletal: Strength & Muscle Tone: UTA Gait & Station: normal Patient leans: N/A  Psychiatric Specialty Exam: Review of Systems  Psychiatric/Behavioral: Negative for agitation, behavioral problems, confusion, decreased concentration, dysphoric mood, hallucinations, self-injury, sleep disturbance and suicidal ideas. The patient is not nervous/anxious and is not hyperactive.     All other systems reviewed and are negative.   There were no vitals taken for this visit.There is no height or weight on file to calculate BMI.  General Appearance: Casual  Eye Contact:  Fair  Speech:  Clear and Coherent  Volume:  Normal  Mood:  Euthymic  Affect:  Congruent  Thought Process:  Goal Directed and Descriptions of Associations: Intact  Orientation:  Full (Time, Place, and Person)  Thought Content: Logical   Suicidal Thoughts:  No  Homicidal Thoughts:  No  Memory:  Immediate;   Fair Recent;   Fair Remote;   Fair  Judgement:  Fair  Insight:  Fair  Psychomotor Activity:  Normal  Concentration:  Concentration: Fair and Attention Span: Fair  Recall:  [employer] of Knowledge: Fair  Language: Fair  Akathisia:  No  Handed:  Right  AIMS (if indicated): UTA  Assets:  Communication Skills Desire for Improvement Housing Social Support  ADL's:  Intact  Cognition: WNL  Sleep:  Fair   Screenings: GAD-7     Office Visit from 06/04/2019 in [facility] Visit from 12/04/2018 in [place] Visit from 06/03/2018 in [facility]  Visit from 04/03/2018 in [facility] Visit from 12/02/2017 in [facility]  Total GAD-7 Score  _0 PHQ2-9     Office Visit from 06/04/2019 in [facility] Visit from 03/15/2019 in [facility] Visit from 12/04/2018 in [facility] Visit from 06/03/2018 in [facility] Visit from 04/03/2018 in [place]  PHQ-2 Total Score  _1 PHQ-9 Total Score  _2 Assessment and Plan: [name] is a 39 year old Caucasian female, married, lives in [place], has a history of depression, anxiety, MS, fatigue, insomnia was evaluated by telemedicine today.  Patient is biologically predisposed given her family history, MS diagnosis.  Patient with psychosocial stressors of being in school, pandemic and her health issues.  She is currently however making progress.  Plan as noted below.  Plan Depressive disorder secondary to medical condition-MS-stable Wellbutrin XL 150 mg p.o. daily Zoloft 200 mg p.o. daily  Insomnia-stable Trazodone 75 mg p.o. nightly  GAD-stable Propranolol 10 mg p.o. daily as needed for anxiety attacks Zoloft as prescribed  Follow-up in clinic in 2 to 3 months or sooner if needed.   I have spent atleast 20 minutes non face to face with patient today. More than 50 % of the time was spent for preparing to see the patient ( e.g., review of test, records ),  ordering medications and test ,psychoeducation and supportive psychotherapy and care coordination,as well as documenting clinical information in electronic health record. This note was generated in part or whole with voice recognition software. Voice recognition is usually quite accurate but there are transcription errors that can and very often do occur. I apologize for any typographical errors that were not detected and corrected.       Ursula Alert,  MD 06/24/2019, 12:20 PM

## 2019-07-13 ENCOUNTER — Other Ambulatory Visit: Payer: Self-pay

## 2019-07-13 MED ORDER — AMPHETAMINE-DEXTROAMPHET ER 30 MG PO CP24: 30.0000 mg | ORAL_CAPSULE | Freq: Every day | ORAL | 0 refills | Status: DC

## 2019-07-13 NOTE — Telephone Encounter (Signed)
Unable to pull pt up on drug registry. I called [facility] to see when she last refilled rx. Spoke with Kathlene November.  She last refilled adderall XR 30mg  06/08/2019. She is due for a refill. Will send to [facility] to e-scribe. Pt last seen 02/22/2019 and next f/u 08/25/2019

## 2019-07-23 ENCOUNTER — Ambulatory Visit (INDEPENDENT_AMBULATORY_CARE_PROVIDER_SITE_OTHER): Payer: PRIVATE HEALTH INSURANCE | Admitting: Nurse Practitioner

## 2019-07-23 ENCOUNTER — Encounter: Payer: Self-pay | Admitting: Nurse Practitioner

## 2019-07-23 ENCOUNTER — Other Ambulatory Visit: Payer: Self-pay

## 2019-07-23 DIAGNOSIS — R21 Rash and other nonspecific skin eruption: Secondary | ICD-10-CM | POA: Diagnosis not present

## 2019-07-23 MED ORDER — VALACYCLOVIR HCL 1 G PO TABS: 1000.0000 mg | ORAL_TABLET | Freq: Three times a day (TID) | ORAL | 0 refills | Status: AC

## 2019-07-23 MED ORDER — TRIAMCINOLONE ACETONIDE 0.1 % EX CREA: 1.0000 "application " | TOPICAL_CREAM | Freq: Two times a day (BID) | CUTANEOUS | 0 refills | Status: DC

## 2019-07-23 NOTE — Assessment & Plan Note (Signed)
Acute x one week.  ?shingles, difficult exam due to large colored tattoo at site.  Will send in script for Valtrex 1000 MG TID, due to her history and current MS treatment at risk for recurrence shingles.  Steroid cream sent for comfort as needed.  Recommend she use Tylenol as needed at home.  Monitor area and notify provider if worsening or ongoing.  Return to office if worsening or ongoing rash.

## 2019-07-23 NOTE — Patient Instructions (Signed)
Rash, Adult  A rash is a change in the color of your skin. A rash can also change the way your skin feels. There are many different conditions and factors that can cause a rash. Follow these instructions at home: The goal of treatment is to stop the itching and keep the rash from spreading. Watch for any changes in your symptoms. Let your doctor know about them. Follow these instructions to help with your condition: Medicine Take or apply over-the-counter and prescription medicines only as told by your doctor. These may include medicines:  To treat red or swollen skin (corticosteroid creams).  To treat itching.  To treat an allergy (oral antihistamines).  To treat very bad symptoms (oral corticosteroids).  Skin care  Put cool cloths (compresses) on the affected areas.  Do not scratch or rub your skin.  Avoid covering the rash. Make sure that the rash is exposed to air as much as possible. Managing itching and discomfort  Avoid hot showers or baths. These can make itching worse. A cold shower may help.  Try taking a bath with: ? Epsom salts. You can get these at your local pharmacy or grocery store. Follow the instructions on the package. ? Baking soda. Pour a small amount into the bath as told by your doctor. ? Colloidal oatmeal. You can get this at your local pharmacy or grocery store. Follow the instructions on the package.  Try putting baking soda paste onto your skin. Stir water into baking soda until it gets like a paste.  Try putting on a lotion that relieves itchiness (calamine lotion).  Keep cool and out of the sun. Sweating and being hot can make itching worse. General instructions   Rest as needed.  Drink enough fluid to keep your pee (urine) pale yellow.  Wear loose-fitting clothing.  Avoid scented soaps, detergents, and perfumes. Use gentle soaps, detergents, perfumes, and other cosmetic products.  Avoid anything that causes your rash. Keep a journal to  help track what causes your rash. Write down: ? What you eat. ? What cosmetic products you use. ? What you drink. ? What you wear. This includes jewelry.  Keep all follow-up visits as told by your doctor. This is important. Contact a doctor if:  You sweat at night.  You lose weight.  You pee (urinate) more than normal.  You pee less than normal, or you notice that your pee is a darker color than normal.  You feel weak.  You throw up (vomit).  Your skin or the whites of your eyes look yellow (jaundice).  Your skin: ? Tingles. ? Is numb.  Your rash: ? Does not go away after a few days. ? Gets worse.  You are: ? More thirsty than normal. ? More tired than normal.  You have: ? New symptoms. ? Pain in your belly (abdomen). ? A fever. ? Watery poop (diarrhea). Get help right away if:  You have a fever and your symptoms suddenly get worse.  You start to feel mixed up (confused).  You have a very bad headache or a stiff neck.  You have very bad joint pains or stiffness.  You have jerky movements that you cannot control (seizure).  Your rash covers all or most of your body. The rash may or may not be painful.  You have blisters that: ? Are on top of the rash. ? Grow larger. ? Grow together. ? Are painful. ? Are inside your nose or mouth.  You have a rash   that: ? Looks like purple pinprick-sized spots all over your body. ? Has a "bull's eye" or looks like a target. ? Is red and painful, causes your skin to peel, and is not from being in the sun too long. Summary  A rash is a change in the color of your skin. A rash can also change the way your skin feels.  The goal of treatment is to stop the itching and keep the rash from spreading.  Take or apply over-the-counter and prescription medicines only as told by your doctor.  Contact a doctor if you have new symptoms or symptoms that get worse.  Keep all follow-up visits as told by your doctor. This is  important. This information is not intended to replace advice given to you by your health care provider. Make sure you discuss any questions you have with your health care provider. Document Revised: 05/15/2018 Document Reviewed: 08/25/2017 Elsevier Patient Education  2020 Elsevier Inc.  

## 2019-07-23 NOTE — Progress Notes (Signed)
BP 117/86   Pulse 84   Temp 98 F (36.7 C) (Oral)   Wt 183 lb (83 kg)   SpO2 99%   BMI 31.61 kg/m    Subjective:    Patient ID: [name], female    DOB: [birth year], 39 y.o.   MRN: [id number]  HPI: [name] is a 39 y.o. female  Chief Complaint  Patient presents with  . Rash    x over a week now. painful and itchy. pt states had had Hx of shingles   RASH Has been present for over a week to left side with pain and itching.  Has history of shingles two years ago.  She does receive treatments for MS.  She does endorse feeling really fatigued like she did when she had shingles. Duration:  days  Location: trunk  Itching: yes Burning: yes Redness: no Oozing: no Scaling: no Blisters: unsure due to tattoo -- she had not noticed Painful: yes Fevers: no Change in detergents/soaps/personal care products: no Recent illness: no Recent travel:no History of same: yes Context: fluctuating Alleviating factors: nothing Treatments attempted:coconut oil Shortness of breath: no  Throat/tongue swelling: no Myalgias/arthralgias: no  Relevant past medical, surgical, family and social history reviewed and updated as indicated. Interim medical history since our last visit reviewed. Allergies and medications reviewed and updated.  Review of Systems  Constitutional: Negative for activity change, appetite change, diaphoresis, fatigue and fever.  Respiratory: Negative for cough, chest tightness and shortness of breath.   Cardiovascular: Negative for chest pain, palpitations and leg swelling.  Gastrointestinal: Negative.   Skin: Positive for rash.  Neurological: Negative.   Psychiatric/Behavioral: Negative.     Per HPI unless specifically indicated above     Objective:    BP 117/86   Pulse 84   Temp 98 F (36.7 C) (Oral)   Wt 183 lb (83 kg)   SpO2 99%   BMI 31.61 kg/m   Wt Readings from Last 3 Encounters:  07/23/19 183 lb (83 kg)  06/04/19 190  lb 9.6 oz (86.5 kg)  03/03/19 184 lb (83.5 kg)    Physical Exam Vitals and nursing note reviewed.  Constitutional:      General: She is awake. She is not in acute distress.    Appearance: She is well-developed and well-groomed. She is obese. She is not ill-appearing.  HENT:     Head: Normocephalic.     Right Ear: Hearing normal.     Left Ear: Hearing normal.  Eyes:     General: Lids are normal.        Right eye: No discharge.        Left eye: No discharge.     Conjunctiva/sclera: Conjunctivae normal.     Pupils: Pupils are equal, round, and reactive to light.  Cardiovascular:     Rate and Rhythm: Normal rate and regular rhythm.     Heart sounds: Normal heart sounds. No murmur heard.  No gallop.   Pulmonary:     Effort: Pulmonary effort is normal. No accessory muscle usage or respiratory distress.     Breath sounds: Normal breath sounds.  Abdominal:     General: Bowel sounds are normal.     Palpations: Abdomen is soft.  Musculoskeletal:     Cervical back: Normal range of motion and neck supple.     Right lower leg: No edema.     Left lower leg: No edema.  Skin:    General: Skin is warm and  dry.     Findings: Rash present.       Neurological:     Mental Status: She is alert and oriented to person, place, and time.  Psychiatric:        Attention and Perception: Attention normal.        Mood and Affect: Mood normal.        Speech: Speech normal.        Behavior: Behavior normal. Behavior is cooperative.        Thought Content: Thought content normal.     Results for orders placed or performed in visit on 12/04/18  CBC with Differential/Platelet  Result Value Ref Range   WBC 7.1 3.4 - 10.8 x10E3/uL   RBC 4.68 3.77 - 5.28 x10E6/uL   Hemoglobin 13.5 11.1 - 15.9 g/dL   Hematocrit 42.3 53.6 - 46.6 %   MCV 86 79 - 97 fL   MCH 28.8 26.6 - 33.0 pg   MCHC 33.6 31 - 35 g/dL   RDW 14.4 31.5 - 40.0 %   Platelets 348 150 - 450 x10E3/uL   Neutrophils 75 Not Estab. %    Lymphs 14 Not Estab. %   Monocytes 7 Not Estab. %   Eos 3 Not Estab. %   Basos 1 Not Estab. %   Neutrophils Absolute 5.4 1 - 7 x10E3/uL   Lymphocytes Absolute 1.0 0 - 3 x10E3/uL   Monocytes Absolute 0.5 0 - 0 x10E3/uL   EOS (ABSOLUTE) 0.2 0.0 - 0.4 x10E3/uL   Basophils Absolute 0.0 0 - 0 x10E3/uL   Immature Granulocytes 0 Not Estab. %   Immature Grans (Abs) 0.0 0.0 - 0.1 x10E3/uL  Comprehensive metabolic panel  Result Value Ref Range   Glucose 58 (L) 65 - 99 mg/dL   BUN 10 6 - 24 mg/dL   Creatinine, Ser 8.67 0.57 - 1.00 mg/dL   GFR calc non Af Amer 97 >59 mL/min/1.73   GFR calc Af Amer 112 >59 mL/min/1.73   BUN/Creatinine Ratio 13 9 - 23   Sodium 140 134 - 144 mmol/L   Potassium 3.7 3.5 - 5.2 mmol/L   Chloride 105 96 - 106 mmol/L   CO2 22 20 - 29 mmol/L   Calcium 8.9 8.7 - 10.2 mg/dL   Total Protein 6.3 6.0 - 8.5 g/dL   Albumin 4.2 3.8 - 4.8 g/dL   Globulin, Total 2.1 1.5 - 4.5 g/dL   Albumin/Globulin Ratio 2.0 1.2 - 2.2   Bilirubin Total <0.2 0.0 - 1.2 mg/dL   Alkaline Phosphatase 106 39 - 117 IU/L   AST 11 0 - 40 IU/L   ALT 9 0 - 32 IU/L  Lipid Panel w/o Chol/HDL Ratio  Result Value Ref Range   Cholesterol, Total 152 100 - 199 mg/dL   Triglycerides 94 0 - 149 mg/dL   HDL 52 >61 mg/dL   VLDL Cholesterol Cal 17 5 - 40 mg/dL   LDL Chol Calc (NIH) 83 0 - 99 mg/dL  TSH  Result Value Ref Range   TSH 1.120 0.450 - 4.500 uIU/mL  VITAMIN D 25 Hydroxy (Vit-D Deficiency, Fractures)  Result Value Ref Range   Vit D, 25-Hydroxy 46.0 30.0 - 100.0 ng/mL      Assessment & Plan:   Problem List Items Addressed This Visit      Musculoskeletal and Integument   Rash    Acute x one week.  ?shingles, difficult exam due to large colored tattoo at site.  Will send in script for  Valtrex 1000 MG TID, due to her history and current MS treatment at risk for recurrence shingles.  Steroid cream sent for comfort as needed.  Recommend she use Tylenol as needed at home.  Monitor area and notify  provider if worsening or ongoing.  Return to office if worsening or ongoing rash.          Follow up plan: Return if symptoms worsen or fail to improve.

## 2019-07-27 ENCOUNTER — Telehealth: Payer: Self-pay | Admitting: *Deleted

## 2019-07-27 NOTE — Telephone Encounter (Signed)
Pt here today for Ocrevus infusion. Pt recently got tattoo that is itchy/irriated. She saw PCP who thinks she may have shingles again (this would be the third time). She was placed on vacyclovir 1000mg  po TID. Per Dr. , ok to infuse since she is already on vacyclovir and this is not her first Ocrevus infusion. I relayed this to Liane,RN/Taylor,RN with intrafusion.

## 2019-08-25 ENCOUNTER — Encounter: Payer: Self-pay | Admitting: Neurology

## 2019-08-25 ENCOUNTER — Ambulatory Visit (INDEPENDENT_AMBULATORY_CARE_PROVIDER_SITE_OTHER): Payer: PRIVATE HEALTH INSURANCE | Admitting: Neurology

## 2019-08-25 ENCOUNTER — Other Ambulatory Visit: Payer: Self-pay

## 2019-08-25 VITALS — BP 125/84 | HR 82 | Ht 63.8 in | Wt 189.5 lb

## 2019-08-25 DIAGNOSIS — G43009 Migraine without aura, not intractable, without status migrainosus: Secondary | ICD-10-CM | POA: Diagnosis not present

## 2019-08-25 DIAGNOSIS — G35 Multiple sclerosis: Secondary | ICD-10-CM

## 2019-08-25 DIAGNOSIS — R269 Unspecified abnormalities of gait and mobility: Secondary | ICD-10-CM

## 2019-08-25 DIAGNOSIS — F988 Other specified behavioral and emotional disorders with onset usually occurring in childhood and adolescence: Secondary | ICD-10-CM

## 2019-08-25 DIAGNOSIS — F418 Other specified anxiety disorders: Secondary | ICD-10-CM

## 2019-08-25 MED ORDER — TOPIRAMATE 100 MG PO TABS: ORAL_TABLET | ORAL | 3 refills | Status: DC

## 2019-08-25 MED ORDER — AMPHETAMINE-DEXTROAMPHET ER 30 MG PO CP24: 30.0000 mg | ORAL_CAPSULE | Freq: Every day | ORAL | 0 refills | Status: DC

## 2019-08-25 MED ORDER — DALFAMPRIDINE ER 10 MG PO TB12: ORAL_TABLET | ORAL | 3 refills | Status: DC

## 2019-08-25 MED ORDER — AMPHETAMINE-DEXTROAMPHETAMINE 10 MG PO TABS: ORAL_TABLET | ORAL | 0 refills | Status: DC

## 2019-08-25 NOTE — Progress Notes (Signed)
[facility]  PATIENT: [name] DOB: 1980-09-15  REFERRING DOCTOR OR PCP:  Dr. Jennings Books  [facility]) SOURCE: Patient, a couple MRI reports and images on PACS. Lab reports, notes from Dr. Brigitte Pulse  _________________________________   HISTORICAL  CHIEF COMPLAINT:  Chief Complaint  Patient presents with  . Follow-up    RM 13, alone. Last seen 02/22/2019. She has had shingles x3. Unable to get vaccine d/t age. She has covid-19 vaccine scheduled for 08/27/19.   . Multiple Sclerosis    On Ocrevus. Receives at [facility] Last: 07/27/19. Next: 02/23/20.   . Migraine    Takes topamax. Has 1-2 migraines per week. She is taking 62m po qhs instead of 1021mbecause it amkes her face numb.   . Fatigue    Takes adderall XR [id number] ran out a few weeks ago, has not been able to fill at the pharmacy. They said it is a insurance issue. I called pharmacy and they just need a refill sent in.    HISTORY OF PRESENT ILLNESS:  HilTennile Ballard a 39 31o. woman with relapsing remitting multiple sclerosis.     Update 08/25/2019: She had her last OCrevus in June.   She has tolerated it well.    She has been on Ocrevus x 2 years.    She notes some difficulty with gait and balance and feels she is about to fall frequently.   She had one fall when the right foot caught a stoop.    She stopped the Ampyra due to cost increase but it had helped.  She has a right foot drop. She also takes baclofen for spasticity with benefit.      Her lower back spasms some when she sits a certain way, steps the wrong way or more active.     She needs to shift a lot.   Her left hand has become slower over there past year.  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     Mood is doing well.   She is on Wellbutrin and plans on reducing the dose.   She notes mild cognitive issues with reduced focus/attention,  STM and processing.   She has had some word finding issues at times.  Adderall XR in the morning with additional IR later in the day has been helpful.  Migraine headaches have been well on topiramate.  MS history:   In late June or early July 2011, she had the onset of numbness that went up to her belly button. She presented to the emergency room. An MRI of the brain showed 1 periventricular focus with maybe a second subtle periventricular focus. An MRI of the cervical spine showed an enhancing lesion adjacent to C3-C4. The diagnosis of possible MS was made but the evidence was not enough to begin a disease modifying therapy. In 2015, she had the onset of similar symptoms and also had decreased ability to use the left hand. An MRI of the brain at that time showed several new lesions not present in 2011, including a focus in the right middle cerebellar peduncle additionally, she had a lumbar puncture consistent with MS. Because of her symptoms, MRI changes and lumbar puncture results, she was diagnosed with clinical definite relapsing remitting MS and was started on Tecfidera. She has continued on Tecfidera. Over the past few months she has noted more difficulty with  focus and concentration.     She also has had some change in vision more recently. Because of these newer symptoms, and MRI was repeated 04/27/2015. The MRI of the cervical spine showed no new lesions. though the MRI of the brain showed 2  lesions not present in 2015, one in the left juxtacortical white matter and one in the left deep white matter.  She had a small exacerbation in August 2017 and received IV SOlu-Medrol.   MRI cervical spine 2017 an MRI of the brain show two spinal plaques, a definite rightt middle cerebellar peduncle plaque and a possible right pontine plaque and several foci in the hemispheres  MRI 01/2016 showed no new lesions.  She is JCV Ab positive.  She had an exacerbation in 2017 and early 2018 switch to ocrelizumab with her  first doses in May 2018 (was on Gilenya).   She had another small exacerbation in July (right leg stiff/weak) and she received 3 days of IV site.  She went from using a cane to a walker for balance.  She tolerates the ocrelizumab well.     FH:  Her mom has MS and her MS stabilized after a bone marrow transplant for cancer.  MRI images MRI of the brain 09/24/2017 showed T2 hyperintense foci in the right middle cerebellar peduncle and hemispheres.  There were no changes compared to 09/08/2017.  MRI of the cervical spine 09/26/2017 showed foci within the spinal cord adjacent to C4 and C6-C7.  They were both present on an MRI from 04/04/2016.  There is mild spinal stenosis at C5-C6 due to disc protrusion.   REVIEW OF SYSTEMS: Constitutional: No fevers, chills, sweats, or change in appetite.  She has fatigue and insomnia. Eyes: No visual changes, double vision, eye pain Ear, nose and throat: No hearing loss, ear pain, nasal congestion, sore throat Cardiovascular: No chest pain, palpitations Respiratory: No shortness of breath at rest or with exertion.   No wheezes GastrointestinaI: No nausea, vomiting, diarrhea, abdominal pain, fecal incontinence Genitourinary:   She notes frequency, some hesitancy at times and nocturia. Musculoskeletal: No neck pain, back pain Integumentary: No rash, pruritus, skin lesions Neurological: as above Psychiatric: as above Endocrine: No palpitations, diaphoresis, change in appetite, change in weigh or increased thirst Hematologic/Lymphatic: No anemia, purpura, petechiae. Allergic/Immunologic: No itchy/runny eyes, nasal congestion, recent allergic reactions, rashes  ALLERGIES: Allergies  Allergen Reactions  . Flexeril [Cyclobenzaprine]     Drowsy     HOME MEDICATIONS:  Current Outpatient Medications:  .  baclofen (LIORESAL) 10 MG tablet, TAKE 1 TABLET BY MOUTH FOUR TIMES A DAY AS NEEDED FOR MUSCLE SPASMS, Disp: 360 tablet, Rfl: 3 .  buPROPion (WELLBUTRIN XL)  150 MG 24 hr tablet, Take 1 tablet (150 mg total) by mouth daily., Disp: 90 tablet, Rfl: 0 .  Cholecalciferol (VITAMIN D-1000 MAX ST) 1000 units tablet, Take 5,000 Units by mouth daily. , Disp: , Rfl:  .  fluticasone (FLONASE) 50 MCG/ACT nasal spray, SPRAY 2 SPRAYS INTO EACH NOSTRIL EVERY DAY, Disp: , Rfl:  .  ibuprofen (ADVIL) 800 MG tablet, Take 1 tablet (800 mg total) by mouth 3 (three) times daily., Disp: 21 tablet, Rfl: 0 .  ocrelizumab 600 mg in sodium chloride 0.9 % 500 mL, Inject 600 mg into the vein every 6 (six) months., Disp: , Rfl:  .  propranolol (INDERAL) 10 MG tablet, TAKE 1 TABLET (10 MG TOTAL) BY MOUTH DAILY AS NEEDED. FOR SEVERE ANXIETY ATTACK, Disp: 30 tablet, Rfl: 1 .  sertraline (ZOLOFT) 100 MG tablet, TAKE 2 TABLETS BY MOUTH EVERY DAY, Disp: 180 tablet, Rfl: 2 .  topiramate (TOPAMAX) 100 MG tablet, TAKE 1 TABLET BY MOUTH EVERYDAY AT BEDTIME, Disp: 90 tablet, Rfl: 3 .  traZODone (DESYREL) 50 MG tablet, Take 1.5 tablets (75 mg total) by mouth at bedtime., Disp: 135 tablet, Rfl: 0 .  triamcinolone cream (KENALOG) 0.1 %, Apply 1 application topically 2 (two) times daily., Disp: 30 g, Rfl: 0 .  amphetamine-dextroamphetamine (ADDERALL XR) 30 MG 24 hr capsule, Take 1 capsule (30 mg total) by mouth daily. (Patient not taking: Reported on 08/25/2019), Disp: 30 capsule, Rfl: 0 .  amphetamine-dextroamphetamine (ADDERALL) 10 MG tablet, Take one or two po in the aftrernoon (Patient not taking: Reported on 08/25/2019), Disp: 60 tablet, Rfl: 0  PAST MEDICAL HISTORY: Past Medical History:  Diagnosis Date  . Depression   . MS (multiple sclerosis) ([place])   . Multiple sclerosis ([place])     PAST SURGICAL HISTORY: No past surgical history on file.  FAMILY HISTORY: Family History  Problem Relation Age of Onset  . Multiple myeloma Mother   . Multiple sclerosis Mother   . Obesity Sister   . Anxiety disorder Daughter   . Multiple sclerosis Maternal Aunt     SOCIAL HISTORY:  Social History    Socioeconomic History  . Marital status: Married    Spouse name: steven ambrose  . Number of children: 3  . Years of education: Not on file  . Highest education level: Bachelor's degree (e.g., BA, AB, BS)  Occupational History  . Not on file  Tobacco Use  . Smoking status: Never Smoker  . Smokeless tobacco: Never Used  Vaping Use  . Vaping Use: Never used  Substance and Sexual Activity  . Alcohol use: Not Currently    Alcohol/week: 0.0 standard drinks    Comment: occasional/fim  . Drug use: No  . Sexual activity: Yes    Birth control/protection: I.U.D.  Other Topics Concern  . Not on file  Social History Narrative  . Not on file   Social Determinants of Health   Financial Resource Strain:   . Difficulty of Paying Living Expenses:   Food Insecurity:   . Worried About Charity fundraiser in the Last Year:   . Arboriculturist in the Last Year:   Transportation Needs:   . Film/video editor (Medical):   Marland Kitchen Lack of Transportation (Non-Medical):   Physical Activity:   . Days of Exercise per Week:   . Minutes of Exercise per Session:   Stress:   . Feeling of Stress :   Social Connections:   . Frequency of Communication with Friends and Family:   . Frequency of Social Gatherings with Friends and Family:   . Attends Religious Services:   . Active Member of Clubs or Organizations:   . Attends Archivist Meetings:   Marland Kitchen Marital Status:   Intimate Partner Violence:   . Fear of Current or Ex-Partner:   . Emotionally Abused:   Marland Kitchen Physically Abused:   . Sexually Abused:      PHYSICAL EXAM  Vitals:   08/25/19 1103  BP: 125/84  Pulse: 82  Weight: 189 lb 8 oz (86 kg)  Height: 5' 3.8" (1.621 m)    Body mass index is 32.73 kg/m.   General: The patient is well-developed and well-nourished and in no acute distress    Musculoskeletal:    The neck is nontender with a good  range of motion.  Neurologic Exam  Mental status: The patient is alert and  oriented x 3 at the time of the examination. The patient has apparent normal recent and remote memory, with an apparently normal attention span and concentration ability.   Speech is normal.  Cranial nerves: Extraocular movements are full.  Facial strength and sensation is normal.  Trapezius strength is normal.  The tongue is midline, and the patient has symmetric elevation of the soft palate. No obvious hearing deficits are noted.  Motor:  Muscle bulk is normal.   Tone is mildly increased in legs, bilaterally fairly equally.  Strength is  5 / 5 in all 4 extremities except 4+/5 in the feet.   Sensory: Sensory testing shows reduced vibration in the right leg but touch is symmetric.    Arms are symmetric.    Coordination: Cerebellar testing shows good finger-nose-finger but reduced heel-to-shin slightly worse on the right than left.     Gait and station: Station is normal.  She has a mild right foot drop.  The tandem gait is wide.  Romberg is negative..   Reflexes: Deep tendon reflexes are symmetric and increased in her legs. She has crossed adductor responses at the knees.       DIAGNOSTIC DATA (LABS, IMAGING, TESTING) - I reviewed patient records, labs, notes, testing and imaging myself where available.  Lab Results  Component Value Date   WBC 7.1 12/04/2018   HGB 13.5 12/04/2018   HCT 40.2 12/04/2018   MCV 86 12/04/2018   PLT 348 12/04/2018      Component Value Date/Time   NA 140 12/04/2018 1143   NA 141 07/28/2013 0410   K 3.7 12/04/2018 1143   K 3.7 07/28/2013 0410   CL 105 12/04/2018 1143   CL 103 07/28/2013 0410   CO2 22 12/04/2018 1143   CO2 29 07/28/2013 0410   GLUCOSE 58 (L) 12/04/2018 1143   GLUCOSE 98 09/08/2016 1855   GLUCOSE 121 (H) 07/28/2013 0410   BUN 10 12/04/2018 1143   BUN 16 07/28/2013 0410   CREATININE 0.77 12/04/2018 1143   CREATININE 0.66 07/28/2013 0410   CALCIUM 8.9 12/04/2018 1143   CALCIUM 8.6 07/28/2013 0410   PROT 6.3 12/04/2018 1143   PROT  6.7 07/25/2013 0410   ALBUMIN 4.2 12/04/2018 1143   ALBUMIN 3.3 (L) 07/25/2013 0410   AST 11 12/04/2018 1143   AST 20 07/25/2013 0410   ALT 9 12/04/2018 1143   ALT 18 07/25/2013 0410   ALKPHOS 106 12/04/2018 1143   ALKPHOS 49 07/25/2013 0410   BILITOT <0.2 12/04/2018 1143   BILITOT 0.4 07/25/2013 0410   GFRNONAA 97 12/04/2018 1143   GFRNONAA >60 07/28/2013 0410   GFRAA 112 12/04/2018 1143   GFRAA >60 07/28/2013 0410       ASSESSMENT AND PLAN  No diagnosis found.   1.   She will continue Ocrevus.  2.   Continue the Adderall 30 mg XR in the morning with 10 mg as needed later in the day for her reduced attention.    Continue Topamax for migraines 3.    Continue baclofen for spasticity issues.  4.   Due to great increase in cost and poor shortness coverage, she is unable to get Ampyra or dalfampridine.  We discussed compounded product and we will see about getting her started on this if it is affordable. 5.   Return to clinic in 5-6 months or sooner if there are new or worsening neurologic  symptoms.  45-minute office visit with the majority of the time spent face-to-face for history and physical, discussion/counseling and decision-making.  Additional time with record review and documentation.    Avnoor Koury A. Felecia Shelling, MD, PhD 3/69/2230, 09:79 AM Certified in Neurology, Clinical Neurophysiology, Sleep Medicine, Pain Medicine and Neuroimaging  [facility] [address], [place], [place] [address] [phone] olll

## 2019-08-25 NOTE — Progress Notes (Signed)
Called [facility] pharmacy to see why pt cannot fill adderall 30mg  XR. They stated they need new prescription. Last refilled 07/13/19 #30, no refills on file.

## 2019-08-27 ENCOUNTER — Ambulatory Visit: Payer: PRIVATE HEALTH INSURANCE

## 2019-09-10 ENCOUNTER — Other Ambulatory Visit (HOSPITAL_COMMUNITY): Payer: Self-pay | Admitting: Nurse Practitioner

## 2019-09-10 DIAGNOSIS — U071 COVID-19: Secondary | ICD-10-CM

## 2019-09-10 NOTE — Progress Notes (Signed)
I connected by phone with [name] on 09/10/2019 at 3:37 PM to discuss the potential use of an new treatment for mild to moderate COVID-19 viral infection in non-hospitalized patients.  This patient is a 39 y.o. female that meets the FDA criteria for Emergency Use Authorization of casirivimab\imdevimab.  Has a (+) direct SARS-CoV-2 viral test result  Has mild or moderate COVID-19   Is ? 39 years of age and weighs ? 40 kg  Is NOT hospitalized due to COVID-19  Is NOT requiring oxygen therapy or requiring an increase in baseline oxygen flow rate due to COVID-19  Is within 10 days of symptom onset  Has at least one of the high risk factor(s) for progression to severe COVID-19 and/or hospitalization as defined in EUA.  Specific high risk criteria : Immunosuppressive Disease or Treatment, BMI   I have spoken and communicated the following to the patient or parent/caregiver:  1. FDA has authorized the emergency use of casirivimab\imdevimab for the treatment of mild to moderate COVID-19 in adults and pediatric patients with positive results of direct SARS-CoV-2 viral testing who are 7 years of age and older weighing at least 40 kg, and who are at high risk for progressing to severe COVID-19 and/or hospitalization.  2. The significant known and potential risks and benefits of casirivimab\imdevimab, and the extent to which such potential risks and benefits are unknown.  3. Information on available alternative treatments and the risks and benefits of those alternatives, including clinical trials.  4. Patients treated with casirivimab\imdevimab should continue to self-isolate and use infection control measures (e.g., wear mask, isolate, social distance, avoid sharing personal items, clean and disinfect "high touch" surfaces, and frequent handwashing) according to CDC guidelines.   5. The patient or parent/caregiver has the option to accept or refuse casirivimab\imdevimab .  After  reviewing this information with the patient, The patient agreed to proceed with receiving casirivimab\imdevimab infusion and will be provided a copy of the Fact sheet prior to receiving the infusion.Consuello Masse, DNP, AGNP-C [phone] (Infusion Center Hotline)

## 2019-09-11 ENCOUNTER — Ambulatory Visit (HOSPITAL_COMMUNITY): Payer: PRIVATE HEALTH INSURANCE

## 2019-09-12 MED ORDER — SODIUM CHLORIDE 0.9 % IV SOLN
1200.0000 mg | Freq: Once | INTRAVENOUS | Status: AC
  Administered 2019-09-13: 1200 mg via INTRAVENOUS
  Filled 2019-09-12: qty 1200

## 2019-09-13 ENCOUNTER — Ambulatory Visit (HOSPITAL_COMMUNITY)
Admission: RE | Admit: 2019-09-13 | Discharge: 2019-09-13 | Disposition: A | Discharge status: Home / Self Care | Payer: PRIVATE HEALTH INSURANCE | Source: Ambulatory Visit | Attending: Pulmonary Disease | Admitting: Pulmonary Disease

## 2019-09-13 DIAGNOSIS — U071 COVID-19: Secondary | ICD-10-CM

## 2019-09-13 MED ORDER — METHYLPREDNISOLONE SODIUM SUCC 125 MG IJ SOLR: 125.0000 mg | Freq: Once | INTRAMUSCULAR | Status: DC | PRN

## 2019-09-13 MED ORDER — ALBUTEROL SULFATE HFA 108 (90 BASE) MCG/ACT IN AERS: 2.0000 | INHALATION_SPRAY | Freq: Once | RESPIRATORY_TRACT | Status: DC | PRN

## 2019-09-13 MED ORDER — DIPHENHYDRAMINE HCL 50 MG/ML IJ SOLN: 50.0000 mg | Freq: Once | INTRAMUSCULAR | Status: DC | PRN

## 2019-09-13 MED ORDER — EPINEPHRINE 0.3 MG/0.3ML IJ SOAJ: 0.3000 mg | Freq: Once | INTRAMUSCULAR | Status: DC | PRN

## 2019-09-13 MED ORDER — FAMOTIDINE IN NACL 20-0.9 MG/50ML-% IV SOLN: 20.0000 mg | Freq: Once | INTRAVENOUS | Status: DC | PRN

## 2019-09-13 MED ORDER — SODIUM CHLORIDE 0.9 % IV SOLN: INTRAVENOUS | Status: DC | PRN

## 2019-09-13 NOTE — Discharge Instructions (Signed)

## 2019-09-13 NOTE — Progress Notes (Signed)
  Diagnosis: COVID-19  Physician: Dr. Wright  Procedure: [facility] Infusion Clinic Med: casirivimab\imdevimab infusion - Provided patient with casirivimab\imdevimab fact sheet for patients, parents and caregivers prior to infusion.  Complications: No immediate complications noted.  Discharge: Discharged home   Aarron Wierzbicki M Delmus Warwick 09/13/2019  

## 2019-09-23 ENCOUNTER — Encounter: Payer: Self-pay | Admitting: Psychiatry

## 2019-09-23 ENCOUNTER — Telehealth (INDEPENDENT_AMBULATORY_CARE_PROVIDER_SITE_OTHER): Payer: PRIVATE HEALTH INSURANCE | Admitting: Psychiatry

## 2019-09-23 ENCOUNTER — Other Ambulatory Visit: Payer: Self-pay

## 2019-09-23 DIAGNOSIS — F411 Generalized anxiety disorder: Secondary | ICD-10-CM

## 2019-09-23 DIAGNOSIS — F5105 Insomnia due to other mental disorder: Secondary | ICD-10-CM | POA: Diagnosis not present

## 2019-09-23 DIAGNOSIS — F0632 Mood disorder due to known physiological condition with major depressive-like episode: Secondary | ICD-10-CM

## 2019-09-23 MED ORDER — TRAZODONE HCL 50 MG PO TABS: 75.0000 mg | ORAL_TABLET | Freq: Every day | ORAL | 0 refills | Status: DC

## 2019-09-23 MED ORDER — BUPROPION HCL ER (XL) 150 MG PO TB24: 150.0000 mg | ORAL_TABLET | Freq: Every day | ORAL | 0 refills | Status: DC

## 2019-09-23 NOTE — Progress Notes (Signed)
Provider Location : [facility] Patient Location : Home  Participants: Patient , Provider  Virtual Visit via Video Note  I connected with [name] on 09/23/19 at  8:30 AM EDT by a video enabled telemedicine application and verified that I am speaking with the correct person using two identifiers.   I discussed the limitations of evaluation and management by telemedicine and the availability of in person appointments. The patient expressed understanding and agreed to proceed.    I discussed the assessment and treatment plan with the patient. The patient was provided an opportunity to ask questions and all were answered. The patient agreed with the plan and demonstrated an understanding of the instructions.   The patient was advised to call back or seek an in-person evaluation if the symptoms worsen or if the condition fails to improve as anticipated.  [place] MD OP Progress Note  09/23/2019 1:23 PM [name]  MRN:  [id number]  Chief Complaint:  Chief Complaint    Follow-up     HPI: [name] is a 39 year old Caucasian female who has a history of depression, GAD, insomnia, optic pneumonitis, multiple sclerosis, migraine headaches was evaluated by telemedicine today.  Patient today reports she recently got infected with COVID-19.  She got antibody infusion which helped her to recover quicker.  She currently has mild congestion of her upper respiratory tract as well as fatigue.  She reports she already struggles with fatigue from her multiple sclerosis however now it feels worse.  She does have Adderall which she takes which helps to some extent with fatigue.  This is being prescribed per neurology.  She is supposed to take Adderall 10 to 20 mg daily in the afternoon and she has been taking only Adderall 10 mg since she is worried about her sleep being affected if she is on a higher dosage.  She reports mood wise she is doing okay.  She reports sleep  continues to be good.  Patient denies any suicidality, homicidality or perceptual disturbances.  She continues to take DBT training for her music therapy course and continues to be passionate about it.  Patient denies any other concerns today.  Visit Diagnosis:    ICD-10-CM   1. 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 GAD (generalized anxiety disorder)  F41.1   3. Insomnia due to mental disorder  F51.05 traZODone (DESYREL) 50 MG tablet    Past Psychiatric History: I have reviewed past psychiatric history from my progress note on 05/21/2018.  Past trials of Zoloft, Adderall, trazodone.  Past Medical History:  Past Medical History:  Diagnosis Date  . Depression   . MS (multiple sclerosis) ([place])   . Multiple sclerosis ([place])    History reviewed. No pertinent surgical history.  Family Psychiatric History: I have reviewed family psychiatric history from my progress note on 05/21/2018  Family History:  Family History  Problem Relation Age of Onset  . Multiple myeloma Mother   . Multiple sclerosis Mother   . Obesity Sister   . Anxiety disorder Daughter   . Multiple sclerosis Maternal Aunt     Social History: I have reviewed social history from my progress note on 05/21/2018 Social History   Socioeconomic History  . Marital status: Married    Spouse name: steven ambrose  . Number of children: 3  . Years of education: Not on file  . Highest  education level: Bachelor's degree (e.g., BA, AB, BS)  Occupational History  . Not on file  Tobacco Use  . Smoking status: Never Smoker  . Smokeless tobacco: Never Used  Vaping Use  . Vaping Use: Never used  Substance and Sexual Activity  . Alcohol use: Not Currently    Alcohol/week: 0.0 standard drinks    Comment: occasional/fim  . Drug use: No  . Sexual activity: Yes    Birth control/protection: I.U.D.  Other  Topics Concern  . Not on file  Social History Narrative  . Not on file   Social Determinants of Health   Financial Resource Strain:   . Difficulty of Paying Living Expenses: Not on file  Food Insecurity:   . Worried About Charity fundraiser in the Last Year: Not on file  . Ran Out of Food in the Last Year: Not on file  Transportation Needs:   . Lack of Transportation (Medical): Not on file  . Lack of Transportation (Non-Medical): Not on file  Physical Activity:   . Days of Exercise per Week: Not on file  . Minutes of Exercise per Session: Not on file  Stress:   . Feeling of Stress : Not on file  Social Connections:   . Frequency of Communication with Friends and Family: Not on file  . Frequency of Social Gatherings with Friends and Family: Not on file  . Attends Religious Services: Not on file  . Active Member of Clubs or Organizations: Not on file  . Attends Archivist Meetings: Not on file  . Marital Status: Not on file    Allergies:  Allergies  Allergen Reactions  . Flexeril [Cyclobenzaprine]     Drowsy     Metabolic Disorder Labs: No results found for: HGBA1C, MPG No results found for: PROLACTIN Lab Results  Component Value Date   CHOL 152 12/04/2018   TRIG 94 12/04/2018   HDL 52 12/04/2018   CHOLHDL 2.6 12/02/2017   LDLCALC 83 12/04/2018   LDLCALC 63 12/02/2017   Lab Results  Component Value Date   TSH 1.120 12/04/2018   TSH 1.240 09/08/2017    Therapeutic Level Labs: No results found for: LITHIUM No results found for: VALPROATE No components found for:  CBMZ  Current Medications: Current Outpatient Medications  Medication Sig Dispense Refill  . amphetamine-dextroamphetamine (ADDERALL XR) 30 MG 24 hr capsule Take 1 capsule (30 mg total) by mouth daily. 30 capsule 0  . amphetamine-dextroamphetamine (ADDERALL) 10 MG tablet Take one or two po in the aftrernoon 60 tablet 0  . baclofen (LIORESAL) 10 MG tablet TAKE 1 TABLET BY MOUTH FOUR  TIMES A DAY AS NEEDED FOR MUSCLE SPASMS 360 tablet 3  . buPROPion (WELLBUTRIN XL) 150 MG 24 hr tablet Take 1 tablet (150 mg total) by mouth daily. 90 tablet 0  . Cholecalciferol (VITAMIN D-1000 MAX ST) 1000 units tablet Take 5,000 Units by mouth daily.     Marland Kitchen dalfampridine 10 MG TB12 34m by mouth three times a day 270 tablet 3  . fluticasone (FLONASE) 50 MCG/ACT nasal spray SPRAY 2 SPRAYS INTO EACH NOSTRIL EVERY DAY    . ibuprofen (ADVIL) 800 MG tablet Take 1 tablet (800 mg total) by mouth 3 (three) times daily. 21 tablet 0  . ocrelizumab 600 mg in sodium chloride 0.9 % 500 mL Inject 600 mg into the vein every 6 (six) months.    . propranolol (INDERAL) 10 MG tablet TAKE 1 TABLET (10 MG TOTAL) BY MOUTH DAILY  AS NEEDED. FOR SEVERE ANXIETY ATTACK 30 tablet 1  . sertraline (ZOLOFT) 100 MG tablet TAKE 2 TABLETS BY MOUTH EVERY DAY 180 tablet 2  . topiramate (TOPAMAX) 100 MG tablet TAKE 1 TABLET BY MOUTH EVERYDAY AT BEDTIME 90 tablet 3  . traZODone (DESYREL) 50 MG tablet Take 1.5 tablets (75 mg total) by mouth at bedtime. 135 tablet 0  . triamcinolone cream (KENALOG) 0.1 % Apply 1 application topically 2 (two) times daily. 30 g 0   No current facility-administered medications for this visit.     Musculoskeletal: Strength & Muscle Tone: UTA Gait & Station: normal Patient leans: N/A  Psychiatric Specialty Exam: Review of Systems  Constitutional: Positive for fatigue.  HENT: Positive for congestion.   Psychiatric/Behavioral: Negative for agitation, behavioral problems, confusion, decreased concentration, dysphoric mood, hallucinations, self-injury, sleep disturbance and suicidal ideas. The patient is not nervous/anxious and is not hyperactive.   All other systems reviewed and are negative.   There were no vitals taken for this visit.There is no height or weight on file to calculate BMI.  General Appearance: Casual  Eye Contact:  Fair  Speech:  Clear and Coherent  Volume:  Normal  Mood:   Euthymic  Affect:  Congruent  Thought Process:  Goal Directed and Descriptions of Associations: Intact  Orientation:  Full (Time, Place, and Person)  Thought Content: Logical   Suicidal Thoughts:  No  Homicidal Thoughts:  No  Memory:  Immediate;   Fair Recent;   Fair Remote;   Fair  Judgement:  Fair  Insight:  Fair  Psychomotor Activity:  Normal  Concentration:  Concentration: Fair and Attention Span: Fair  Recall:  [employer] of Knowledge: Fair  Language: Fair  Akathisia:  No  Handed:  Right  AIMS (if indicated): UTA  Assets:  Communication Skills Desire for Improvement Housing Social Support  ADL's:  Intact  Cognition: WNL  Sleep:  Fair   Screenings: GAD-7     Office Visit from 06/04/2019 in [facility] Visit from 12/04/2018 in [facility] Visit from 06/03/2018 in [facility] Visit from 04/03/2018 in [facility] Visit from 12/02/2017 in [facility]  Total GAD-7 Score 6 11 5 16 2     PHQ2-9     Office Visit from 06/04/2019 in [facility] Visit from 03/15/2019 in [facility] Visit from 12/04/2018 in [facility] Visit from 06/03/2018 in [facility] Visit from 04/03/2018 in [facility]  PHQ-2 Total Score 2 2 3 2 5   PHQ-9 Total Score 12 13 15 8 18        Assessment and Plan: [name] is a 39 year old Caucasian female, married, lives in [place], has a history of depression, anxiety, MS, fatigue, insomnia was evaluated by telemedicine today.  Patient is biologically predisposed given her family history, MS diagnosis.  Patient with psychosocial stressors of recent COVID-19 infection, being in school, medical problems like MS.  Patient however is currently making progress.  Plan as noted below.  Plan Depression secondary to medical condition-MS-stable Wellbutrin XL 150 mg p.o.  daily Zoloft 200 mg p.o. daily  Insomnia-stable Trazodone 75 mg p.o. nightly. However discussed with patient to increase the trazodone to 100 mg p.o. nightly as needed if she feels the Adderall higher dosage affects her sleep.  GAD-stable Propranolol 10 mg p.o. daily as needed for anxiety attacks Zoloft as prescribed  Follow-up in clinic in 2 to 3  months or sooner if needed.  I have spent atleast 20 minutes non face to face with patient today. More than 50 % of the time was spent for ordering medications and test ,psychoeducation and supportive psychotherapy and care coordination,as well as documenting clinical information in electronic health record. This note was generated in part or whole with voice recognition software. Voice recognition is usually quite accurate but there are transcription errors that can and very often do occur. I apologize for any typographical errors that were not detected and corrected.        Ursula Alert, MD 09/23/2019, 1:23 PM

## 2019-09-30 ENCOUNTER — Other Ambulatory Visit: Payer: Self-pay

## 2019-09-30 ENCOUNTER — Telehealth: Payer: Self-pay | Admitting: *Deleted

## 2019-09-30 MED ORDER — AMPHETAMINE-DEXTROAMPHET ER 30 MG PO CP24: 30.0000 mg | ORAL_CAPSULE | Freq: Every day | ORAL | 0 refills | Status: DC

## 2019-09-30 NOTE — Telephone Encounter (Signed)
FYI - this patient still shows up under her previous name in the [facility] narcotic registry. [name].

## 2019-10-21 ENCOUNTER — Other Ambulatory Visit: Payer: Self-pay | Admitting: Family

## 2019-10-21 DIAGNOSIS — J069 Acute upper respiratory infection, unspecified: Secondary | ICD-10-CM

## 2019-10-29 ENCOUNTER — Other Ambulatory Visit: Payer: Self-pay

## 2019-11-01 MED ORDER — AMPHETAMINE-DEXTROAMPHET ER 30 MG PO CP24: 30.0000 mg | ORAL_CAPSULE | Freq: Every day | ORAL | 0 refills | Status: DC

## 2019-11-30 ENCOUNTER — Other Ambulatory Visit: Payer: Self-pay | Admitting: Nurse Practitioner

## 2019-12-02 ENCOUNTER — Other Ambulatory Visit: Payer: Self-pay | Admitting: Neurology

## 2019-12-03 MED ORDER — AMPHETAMINE-DEXTROAMPHET ER 30 MG PO CP24: 30.0000 mg | ORAL_CAPSULE | Freq: Every day | ORAL | 0 refills | Status: DC

## 2019-12-06 ENCOUNTER — Ambulatory Visit (INDEPENDENT_AMBULATORY_CARE_PROVIDER_SITE_OTHER): Payer: PRIVATE HEALTH INSURANCE | Admitting: Nurse Practitioner

## 2019-12-06 ENCOUNTER — Other Ambulatory Visit: Payer: Self-pay

## 2019-12-06 ENCOUNTER — Encounter: Payer: Self-pay | Admitting: Nurse Practitioner

## 2019-12-06 VITALS — BP 117/80 | HR 111 | Temp 98.1°F | Ht 63.39 in | Wt 194.4 lb

## 2019-12-06 DIAGNOSIS — G43009 Migraine without aura, not intractable, without status migrainosus: Secondary | ICD-10-CM

## 2019-12-06 DIAGNOSIS — Z6834 Body mass index (BMI) 34.0-34.9, adult: Secondary | ICD-10-CM

## 2019-12-06 DIAGNOSIS — Z13220 Encounter for screening for lipoid disorders: Secondary | ICD-10-CM | POA: Diagnosis not present

## 2019-12-06 DIAGNOSIS — F988 Other specified behavioral and emotional disorders with onset usually occurring in childhood and adolescence: Secondary | ICD-10-CM | POA: Diagnosis not present

## 2019-12-06 DIAGNOSIS — Z1329 Encounter for screening for other suspected endocrine disorder: Secondary | ICD-10-CM | POA: Diagnosis not present

## 2019-12-06 DIAGNOSIS — F418 Other specified anxiety disorders: Secondary | ICD-10-CM | POA: Diagnosis not present

## 2019-12-06 DIAGNOSIS — G35 Multiple sclerosis: Secondary | ICD-10-CM | POA: Diagnosis not present

## 2019-12-06 DIAGNOSIS — E559 Vitamin D deficiency, unspecified: Secondary | ICD-10-CM | POA: Insufficient documentation

## 2019-12-06 DIAGNOSIS — E6609 Other obesity due to excess calories: Secondary | ICD-10-CM

## 2019-12-06 DIAGNOSIS — Z803 Family history of malignant neoplasm of breast: Secondary | ICD-10-CM | POA: Insufficient documentation

## 2019-12-06 DIAGNOSIS — Z0000 Encounter for general adult medical examination without abnormal findings: Secondary | ICD-10-CM | POA: Diagnosis not present

## 2019-12-06 DIAGNOSIS — Z23 Encounter for immunization: Secondary | ICD-10-CM | POA: Diagnosis not present

## 2019-12-06 NOTE — Patient Instructions (Addendum)
[facility]  Address: [address], [place], [place] [address]  Phone: [phone]   Mammogram A mammogram is an X-ray of the breasts that is done to check for changes that are not normal. This test can screen for and find any changes that may suggest breast cancer. Mammograms are regularly done on women. A man may have a mammogram if he has a lump or swelling in his breast. This test can also help to find other changes and variations in the breast. Tell a doctor:  About any allergies you have.  If you have breast implants.  If you have had previous breast disease, biopsy, or surgery.  If you are breastfeeding.  If you are younger than age 25.  If you have a family history of breast cancer.  Whether you are pregnant or may be pregnant. What are the risks? Generally, this is a safe procedure. However, problems may occur, including:  Exposure to radiation. Radiation levels are very low with this test.  The results being misinterpreted.  The need for further tests.  The inability of the mammogram to detect certain cancers. What happens before the procedure?  Have this test done about 1-2 weeks after your period. This is usually when your breasts are the least tender.  If you are visiting a new doctor or clinic, send any past mammogram images to your new doctor's office.  Wash your breasts and under your arms the day of the test.  Do not use deodorants, perfumes, lotions, or powders on the day of the test.  Take off any jewelry from your neck.  Wear clothes that you can change into and out of easily. What happens during the procedure?   You will undress from the waist up. You will put on a gown.  You will stand in front of the X-ray machine.  Each breast will be placed between two plastic or glass plates. The plates will press down on your breast for a few seconds. Try to stay as relaxed as possible. This does not cause any  harm to your breasts. Any discomfort you feel will be very brief.  X-rays will be taken from different angles of each breast. The procedure may vary among doctors and hospitals. What happens after the procedure?  The mammogram will be read by a specialist (radiologist).  You may need to do certain parts of the test again. This depends on the quality of the images.  Ask when your test results will be ready. Make sure you get your test results.  You may go back to your normal activities. Summary  A mammogram is a low energy X-ray of the breasts that is done to check for abnormal changes. A man may have this test if he has a lump or swelling in his breast.  Before the procedure, tell your doctor about any breast problems that you have had in the past.  Have this test done about 1-2 weeks after your period.  For the test, each breast will be placed between two plastic or glass plates. The plates will press down on your breast for a few seconds.  The mammogram will be read by a specialist (radiologist). Ask when your test results will be ready. Make sure you get your test results. This information is not intended to replace advice given to you by your health care provider. Make sure you discuss any questions you have with your health care provider. Document Revised: 09/11/2017 Document   Reviewed: 09/11/2017 Elsevier Patient Education  2020 [employer]  

## 2019-12-06 NOTE — Assessment & Plan Note (Signed)
BMI 34.02.  Recommended eating smaller high protein, low fat meals more frequently and exercising 30 mins a day 5 times a week with a goal of 10-15lb weight loss in the next 3 months. Patient voiced their understanding and motivation to adhere to these recommendations.  

## 2019-12-06 NOTE — Assessment & Plan Note (Addendum)
Chronic, ongoing.  Denies SI/HI.  Continue collaboration with psychiatry and psychology + current medication regimen as prescribed by them.  Return to office in 6 months or sooner if worsening symptoms.

## 2019-12-06 NOTE — Assessment & Plan Note (Signed)
Chronic, ongoing.  Continue current supplement and check Vit D today. 

## 2019-12-06 NOTE — Assessment & Plan Note (Signed)
Chronic, ongoing.  Continue to collaborate with neurology and current treatment.

## 2019-12-06 NOTE — Assessment & Plan Note (Signed)
Chronic, ongoing and stable with Topamax.  Continue collaboration with neurology and recent notes reviewed. 

## 2019-12-06 NOTE — Assessment & Plan Note (Signed)
Chronic, ongoing and stable with Topamax.  Continue collaboration with neurology and recent notes reviewed.

## 2019-12-06 NOTE — Progress Notes (Signed)
BP 117/80    Pulse (!) 111    Temp 98.1 F (36.7 C) (Oral)    Ht 5' 3.39" (1.61 m)    Wt 194 lb 6.4 oz (88.2 kg)    SpO2 99%    BMI 34.02 kg/m    Subjective:    Patient ID: [name], female    DOB: 1980-11-07, 39 y.o.   MRN: [id number]  HPI: [name] is a 39 y.o. female presenting on 12/06/2019 for comprehensive medical examination. Current medical complaints include:none  She currently lives with: husband and children Menopausal Symptoms: no   MIGRAINES Continues on Topamax and is followed by neurology for this and her MS -- last saw 08/25/19. Headache status at time of visit: asymptomatic Treatments attempted: Treatments attempted: none, triptans and topamax   Aura: yes Nausea:  yes Vomiting: no Photophobia:  no Phonophobia:  no Effect on social functioning:  no Numbers of missed days of school/work each month: 0 Confusion:  no Gait disturbance/ataxia:  no Behavioral changes:  no Fevers:  no   DEPRESSION Followed by psychiatry at [facility], Dr. Shea Evans. Last seen 09/23/19. Patient also is followed by therapist. Continues on Sertraline, Wellbutrin, Adderall, and Trazodone + Propranolol as needed. Mood status: stable Satisfied with current treatment?: yes Symptom severity: mild  Duration of current treatment : chronic Side effects: no Medication compliance: good compliance Psychotherapy/counseling: yes current Depressed mood: yes Anxious mood: no Anhedonia: no Significant weight loss or gain: no Insomnia: no hard to fall asleep Fatigue: no Feelings of worthlessness or guilt: no Impaired concentration/indecisiveness: no Suicidal ideations: no Hopelessness: no Crying spells: no Depression screen [facility] 2/9 12/06/2019 06/04/2019 03/15/2019 12/04/2018 06/03/2018  Decreased Interest 1 1 1 1 2   Down, Depressed, Hopeless 1 1 1 2  0  PHQ - 2 Score 2 2 2 3 2   Altered sleeping 2 2 2 1  0  Tired, decreased energy 2 3 3 3 3   Change in appetite 2 3 3 3  0    Feeling bad or failure about yourself  0 0 2 3 2   Trouble concentrating 1 2 1 2 1   Moving slowly or fidgety/restless 0 0 0 0 0  Suicidal thoughts 0 0 0 0 0  PHQ-9 Score 9 12 13 15 8   Difficult doing work/chores Somewhat difficult Somewhat difficult Somewhat difficult Somewhat difficult Not difficult at all    The patient does not have a history of falls. I did not complete a risk assessment for falls. A plan of care for falls was not documented.   Past Medical History:  Past Medical History:  Diagnosis Date   Depression    MS (multiple sclerosis) ([place])    Multiple sclerosis ([place])     Surgical History:  History reviewed. No pertinent surgical history.  Medications:  Current Outpatient Medications on File Prior to Visit  Medication Sig   amphetamine-dextroamphetamine (ADDERALL XR) 30 MG 24 hr capsule Take 1 capsule (30 mg total) by mouth daily.   amphetamine-dextroamphetamine (ADDERALL) 10 MG tablet Take one or two po in the aftrernoon   baclofen (LIORESAL) 10 MG tablet TAKE 1 TABLET BY MOUTH FOUR TIMES A DAY AS NEEDED FOR MUSCLE SPASMS   buPROPion (WELLBUTRIN XL) 150 MG 24 hr tablet Take 1 tablet (150 mg total) by mouth daily.   Cholecalciferol (VITAMIN D-1000 MAX ST) 1000 units tablet Take 5,000 Units by mouth daily.    fluticasone (FLONASE) 50 MCG/ACT nasal spray SPRAY 2 SPRAYS INTO EACH NOSTRIL EVERY DAY  ibuprofen (ADVIL) 800 MG tablet Take 1 tablet (800 mg total) by mouth 3 (three) times daily.   ocrelizumab 600 mg in sodium chloride 0.9 % 500 mL Inject 600 mg into the vein every 6 (six) months.   propranolol (INDERAL) 10 MG tablet TAKE 1 TABLET (10 MG TOTAL) BY MOUTH DAILY AS NEEDED. FOR SEVERE ANXIETY ATTACK   sertraline (ZOLOFT) 100 MG tablet TAKE 2 TABLETS BY MOUTH EVERY DAY   topiramate (TOPAMAX) 100 MG tablet TAKE 1 TABLET BY MOUTH EVERYDAY AT BEDTIME   traZODone (DESYREL) 50 MG tablet Take 1.5 tablets (75 mg total) by mouth at bedtime.    triamcinolone cream (KENALOG) 0.1 % Apply 1 application topically 2 (two) times daily.   dalfampridine 10 MG TB12 52m by mouth three times a day (Patient not taking: Reported on 12/06/2019)   No current facility-administered medications on file prior to visit.    Allergies:  Allergies  Allergen Reactions   Flexeril [Cyclobenzaprine]     Drowsy     Social History:  Social History   Socioeconomic History   Marital status: Married    Spouse name: steven ambrose   Number of children: 3   Years of education: Not on file   Highest education level: Bachelor's degree (e.g., BA, AB, BS)  Occupational History   Not on file  Tobacco Use   Smoking status: Never Smoker   Smokeless tobacco: Never Used  Vaping Use   Vaping Use: Never used  Substance and Sexual Activity   Alcohol use: Not Currently    Alcohol/week: 0.0 standard drinks    Comment: occasional/fim   Drug use: No   Sexual activity: Yes    Birth control/protection: I.U.D.  Other Topics Concern   Not on file  Social History Narrative   Not on file   Social Determinants of Health   Financial Resource Strain:    Difficulty of Paying Living Expenses: Not on file  Food Insecurity:    Wor[place] About [place] the Last Year: Not on file   Ran Out of Food in the Last Year: Not on file  Transportation Needs:    Lack of Transportation (Medical): Not on file   Lack of Transportation (Non-Medical): Not on file  Physical Activity:    Days of Exercise per Week: Not on file   Minutes of Exercise per Session: Not on file  Stress:    Feeling of Stress : Not on file  Social Connections:    Frequency of Communication with Friends and Family: Not on file   Frequency of Social Gatherings with Friends and Family: Not on file   Attends Religious Services: Not on file   Active Member of Clubs or Organizations: Not on file   Attends CArchivistMeetings: Not on file   Marital Status:  Not on file  Intimate Partner Violence:    Fear of Current or Ex-Partner: Not on file   Emotionally Abused: Not on file   Physically Abused: Not on file   Sexually Abused: Not on file   Social History   Tobacco Use  Smoking Status Never Smoker  Smokeless Tobacco Never Used   Social History   Substance and Sexual Activity  Alcohol Use Not Currently   Alcohol/week: 0.0 standard drinks   Comment: occasional/fim    Family History:  Family History  Problem Relation Age of Onset   Multiple myeloma Mother    Multiple sclerosis Mother    Obesity Sister  Anxiety disorder Daughter    Multiple sclerosis Maternal Aunt     Past medical history, surgical history, medications, allergies, family history and social history reviewed with patient today and changes made to appropriate areas of the chart.   Review of Systems - negative All other ROS negative except what is listed above and in the HPI.      Objective:    BP 117/80    Pulse (!) 111    Temp 98.1 F (36.7 C) (Oral)    Ht 5' 3.39" (1.61 m)    Wt 194 lb 6.4 oz (88.2 kg)    SpO2 99%    BMI 34.02 kg/m   Wt Readings from Last 3 Encounters:  12/06/19 194 lb 6.4 oz (88.2 kg)  08/25/19 189 lb 8 oz (86 kg)  07/23/19 183 lb (83 kg)    Physical Exam Constitutional:      General: She is awake. She is not in acute distress.    Appearance: She is well-developed. She is not ill-appearing.  HENT:     Head: Normocephalic and atraumatic.     Right Ear: Hearing, tympanic membrane, ear canal and external ear normal. No drainage.     Left Ear: Hearing, tympanic membrane, ear canal and external ear normal. No drainage.     Nose: Nose normal.     Right Sinus: No maxillary sinus tenderness or frontal sinus tenderness.     Left Sinus: No maxillary sinus tenderness or frontal sinus tenderness.     Mouth/Throat:     Mouth: Mucous membranes are moist.     Pharynx: Oropharynx is clear. Uvula midline. No pharyngeal swelling,  oropharyngeal exudate or posterior oropharyngeal erythema.  Eyes:     General: Lids are normal.        Right eye: No discharge.        Left eye: No discharge.     Extraocular Movements: Extraocular movements intact.     Conjunctiva/sclera: Conjunctivae normal.     Pupils: Pupils are equal, round, and reactive to light.     Visual Fields: Right eye visual fields normal and left eye visual fields normal.  Neck:     Thyroid: No thyromegaly.     Vascular: No carotid bruit.     Trachea: Trachea normal.  Cardiovascular:     Rate and Rhythm: Normal rate and regular rhythm.     Heart sounds: Normal heart sounds. No murmur heard.  No gallop.   Pulmonary:     Effort: Pulmonary effort is normal. No accessory muscle usage or respiratory distress.     Breath sounds: Normal breath sounds.  Chest:     Breasts:        Right: Normal.        Left: Normal.  Abdominal:     General: Bowel sounds are normal.     Palpations: Abdomen is soft. There is no hepatomegaly or splenomegaly.     Tenderness: There is no abdominal tenderness.  Musculoskeletal:        General: Normal range of motion.     Cervical back: Normal range of motion and neck supple.     Right lower leg: No edema.     Left lower leg: No edema.  Lymphadenopathy:     Head:     Right side of head: No submental, submandibular, tonsillar, preauricular or posterior auricular adenopathy.     Left side of head: No submental, submandibular, tonsillar, preauricular or posterior auricular adenopathy.     Cervical: No  cervical adenopathy.     Upper Body:     Right upper body: No supraclavicular, axillary or pectoral adenopathy.     Left upper body: No supraclavicular, axillary or pectoral adenopathy.  Skin:    General: Skin is warm and dry.     Capillary Refill: Capillary refill takes less than 2 seconds.     Findings: No rash.  Neurological:     Mental Status: She is alert and oriented to person, place, and time.     Cranial Nerves: Cranial  nerves are intact.     Gait: Gait is intact.     Deep Tendon Reflexes: Reflexes are normal and symmetric.     Reflex Scores:      Brachioradialis reflexes are 2+ on the right side and 2+ on the left side.      Patellar reflexes are 2+ on the right side and 2+ on the left side. Psychiatric:        Attention and Perception: Attention normal.        Mood and Affect: Mood normal.        Speech: Speech normal.        Behavior: Behavior normal. Behavior is cooperative.        Thought Content: Thought content normal.        Judgment: Judgment normal.    Results for orders placed or performed in visit on 12/04/18  CBC with Differential/Platelet  Result Value Ref Range   WBC 7.1 3.4 - 10.8 x10E3/uL   RBC 4.68 3.77 - 5.28 x10E6/uL   Hemoglobin 13.5 11.1 - 15.9 g/dL   Hematocrit 40.2 34.0 - 46.6 %   MCV 86 79 - 97 fL   MCH 28.8 26.6 - 33.0 pg   MCHC 33.6 31 - 35 g/dL   RDW 13.2 11.7 - 15.4 %   Platelets 348 150 - 450 x10E3/uL   Neutrophils 75 Not Estab. %   Lymphs 14 Not Estab. %   Monocytes 7 Not Estab. %   Eos 3 Not Estab. %   Basos 1 Not Estab. %   Neutrophils Absolute 5.4 1.40 - 7.00 x10E3/uL   Lymphocytes Absolute 1.0 0 - 3 x10E3/uL   Monocytes Absolute 0.5 0 - 0 x10E3/uL   EOS (ABSOLUTE) 0.2 0.0 - 0.4 x10E3/uL   Basophils Absolute 0.0 0 - 0 x10E3/uL   Immature Granulocytes 0 Not Estab. %   Immature Grans (Abs) 0.0 0.0 - 0.1 x10E3/uL  Comprehensive metabolic panel  Result Value Ref Range   Glucose 58 (L) 65 - 99 mg/dL   BUN 10 6 - 24 mg/dL   Creatinine, Ser 0.77 0.57 - 1.00 mg/dL   GFR calc non Af Amer 97 >59 mL/min/1.73   GFR calc Af Amer 112 >59 mL/min/1.73   BUN/Creatinine Ratio 13 9 - 23   Sodium 140 134 - 144 mmol/L   Potassium 3.7 3.5 - 5.2 mmol/L   Chloride 105 96 - 106 mmol/L   CO2 22 20 - 29 mmol/L   Calcium 8.9 8.7 - 10.2 mg/dL   Total Protein 6.3 6.0 - 8.5 g/dL   Albumin 4.2 3.8 - 4.8 g/dL   Globulin, Total 2.1 1.5 - 4.5 g/dL   Albumin/Globulin Ratio 2.0  1.2 - 2.2   Bilirubin Total <0.2 0.0 - 1.2 mg/dL   Alkaline Phosphatase 106 39 - 117 IU/L   AST 11 0 - 40 IU/L   ALT 9 0 - 32 IU/L  Lipid Panel w/o Chol/HDL Ratio  Result Value  Ref Range   Cholesterol, Total 152 100 - 199 mg/dL   Triglycerides 94 0 - 149 mg/dL   HDL 52 >39 mg/dL   VLDL Cholesterol Cal 17 5 - 40 mg/dL   LDL Chol Calc (NIH) 83 0 - 99 mg/dL  TSH  Result Value Ref Range   TSH 1.120 0.450 - 4.500 uIU/mL  VITAMIN D 25 Hydroxy (Vit-D Deficiency, Fractures)  Result Value Ref Range   Vit D, 25-Hydroxy 46.0 30.0 - 100.0 ng/mL      Assessment & Plan:   Problem List Items Addressed This Visit      Cardiovascular and Mediastinum   Migraine without aura and without status migrainosus, not intractable    Chronic, ongoing and stable with Topamax.  Continue collaboration with neurology and recent notes reviewed.        Nervous and Auditory   Multiple sclerosis ([facility])    Chronic, ongoing.  Continue to collaborate with neurology and current treatment.        Other   Depression with anxiety    Chronic, ongoing.  Denies SI/HI.  Continue collaboration with psychiatry and psychology + current medication regimen as prescribed by them.  Return to office in 6 months or sooner if worsening symptoms.      Attention deficit disorder    Chronic, ongoing, followed by psychiatry, continue current medication regimen and collaboration with them.      Obesity    BMI 34.02. Recommended eating smaller high protein, low fat meals more frequently and exercising 30 mins a day 5 times a week with a goal of 10-15lb weight loss in the next 3 months. Patient voiced their understanding and motivation to adhere to these recommendations.       Vitamin D deficiency    Chronic, ongoing.  Continue current supplement and check Vit D today.      Relevant Orders   VITAMIN D 25 Hydroxy (Vit-D Deficiency, Fractures)   Family history of breast cancer    Maternal aunt and great grandmother, will  start mammogram screening due to history.  Order placed.      Relevant Orders   MM DIGITAL SCREENING BILATERAL    Other Visit Diagnoses    Encounter for annual physical exam    -  Primary   Annual labs today to include CBC, CMP, TSH, Lipid (not fasting)   Relevant Orders   CBC with Differential/Platelet   Comprehensive metabolic panel   Screening cholesterol level       Lipid panel today, non fasting.   Relevant Orders   Lipid Panel w/o Chol/HDL Ratio   Thyroid disorder screen       TSH check today   Relevant Orders   TSH   Need for influenza vaccination       Flu vaccine today   Relevant Orders   Flu Vaccine QUAD 6+ mos PF IM (Fluarix Quad PF) (Completed)       Follow up plan: Return in about 6 months (around 06/04/2020) for MS, MOOD, MIGRAINES.   LABORATORY TESTING:  - Pap smear: up to date  IMMUNIZATIONS:   - Tdap: Tetanus vaccination status reviewed: not to get due to MS. - Influenza: Administered today - Pneumovax: Not applicable - Prevnar: Not applicable - HPV: Not applicable - Zostavax vaccine: Not applicable  SCREENING: -Mammogram: Ordered today -- family history -- aunt and maternal great grandmother - Colonoscopy: Not applicable  - Bone Density: Not applicable  -Hearing Test: Not applicable  -Spirometry: Not applicable  PATIENT COUNSELING:   Advised to take 1 mg of folate supplement per day if capable of pregnancy.   Sexuality: Discussed sexually transmitted diseases, partner selection, use of condoms, avoidance of unintended pregnancy  and contraceptive alternatives.   Advised to avoid cigarette smoking.  I discussed with the patient that most people either abstain from alcohol or drink within safe limits (<=14/week and <=4 drinks/occasion for males, <=7/weeks and <= 3 drinks/occasion for females) and that the risk for alcohol disorders and other health effects rises proportionally with the number of drinks per week and how often a drinker exceeds  daily limits.  Discussed cessation/primary prevention of drug use and availability of treatment for abuse.   Diet: Encouraged to adjust caloric intake to maintain  or achieve ideal body weight, to reduce intake of dietary saturated fat and total fat, to limit sodium intake by avoiding high sodium foods and not adding table salt, and to maintain adequate dietary potassium and calcium preferably from fresh fruits, vegetables, and low-fat dairy products.    Stressed the importance of regular exercise  Injury prevention: Discussed safety belts, safety helmets, smoke detector, smoking near bedding or upholstery.   Dental health: Discussed importance of regular tooth brushing, flossing, and dental visits.    NEXT PREVENTATIVE PHYSICAL DUE IN 1 YEAR. Return in about 6 months (around 06/04/2020) for MS, MOOD, MIGRAINES.

## 2019-12-06 NOTE — Assessment & Plan Note (Signed)
Maternal aunt and great grandmother, will start mammogram screening due to history.  Order placed. 

## 2019-12-06 NOTE — Assessment & Plan Note (Signed)
Chronic, ongoing, followed by psychiatry, continue current medication regimen and collaboration with them.

## 2019-12-07 LAB — COMPREHENSIVE METABOLIC PANEL
ALT: 12 IU/L (ref 0–32)
AST: 15 IU/L (ref 0–40)
Albumin/Globulin Ratio: 2.1 (ref 1.2–2.2)
Albumin: 4.6 g/dL (ref 3.8–4.8)
Alkaline Phosphatase: 122 IU/L — ABNORMAL HIGH (ref 44–121)
BUN/Creatinine Ratio: 11 (ref 9–23)
BUN: 9 mg/dL (ref 6–24)
Bilirubin Total: <0.2 mg/dL (ref 0.0–1.2)
CO2: 24 mmol/L (ref 20–29)
Calcium: 8.9 mg/dL (ref 8.7–10.2)
Chloride: 103 mmol/L (ref 96–106)
Creatinine, Ser: 0.79 mg/dL (ref 0.57–1.00)
GFR calc Af Amer: 108 mL/min/{1.73_m2} (ref >59)
GFR calc non Af Amer: 93 mL/min/{1.73_m2} (ref >59)
Globulin, Total: 2.2 g/dL (ref 1.5–4.5)
Glucose: 75 mg/dL (ref 65–99)
Potassium: 3.8 mmol/L (ref 3.5–5.2)
Sodium: 141 mmol/L (ref 134–144)
Total Protein: 6.8 g/dL (ref 6.0–8.5)

## 2019-12-07 LAB — CBC WITH DIFFERENTIAL/PLATELET
Basophils Absolute: 0 10*3/uL (ref 0.0–0.2)
Basos: 1 %
EOS (ABSOLUTE): 0.1 10*3/uL (ref 0.0–0.4)
Eos: 1 %
Hematocrit: 42.9 % (ref 34.0–46.6)
Hemoglobin: 14.4 g/dL (ref 11.1–15.9)
Immature Grans (Abs): 0 10*3/uL (ref 0.0–0.1)
Immature Granulocytes: 0 %
Lymphocytes Absolute: 1.2 10*3/uL (ref 0.7–3.1)
Lymphs: 14 %
MCH: 28.3 pg (ref 26.6–33.0)
MCHC: 33.6 g/dL (ref 31.5–35.7)
MCV: 84 fL (ref 79–97)
Monocytes Absolute: 0.6 10*3/uL (ref 0.1–0.9)
Monocytes: 7 %
Neutrophils Absolute: 6.8 10*3/uL (ref 1.4–7.0)
Neutrophils: 77 %
Platelets: 414 10*3/uL (ref 150–450)
RBC: 5.09 x10E6/uL (ref 3.77–5.28)
RDW: 13.2 % (ref 11.7–15.4)
WBC: 8.8 10*3/uL (ref 3.4–10.8)

## 2019-12-07 LAB — LIPID PANEL W/O CHOL/HDL RATIO
Cholesterol, Total: 206 mg/dL — ABNORMAL HIGH (ref 100–199)
HDL: 55 mg/dL (ref >39)
LDL Chol Calc (NIH): 118 mg/dL — ABNORMAL HIGH (ref 0–99)
Triglycerides: 187 mg/dL — ABNORMAL HIGH (ref 0–149)
VLDL Cholesterol Cal: 33 mg/dL (ref 5–40)

## 2019-12-07 LAB — VITAMIN D 25 HYDROXY (VIT D DEFICIENCY, FRACTURES): Vit D, 25-Hydroxy: 43.2 ng/mL (ref 30.0–100.0)

## 2019-12-07 LAB — TSH: TSH: 1.09 u[IU]/mL (ref 0.450–4.500)

## 2019-12-07 NOTE — Progress Notes (Signed)
Contacted via MyChart The 10-year ASCVD risk score Kristen George DC Jr., et al., 2013) is: 0.5%   Values used to calculate the score:     Age: 39 years     Sex: Female     Is Non-Hispanic African American: No     Diabetic: No     Tobacco smoker: No     Systolic Blood Pressure: 117 mmHg     Is BP treated: No     HDL Cholesterol: 55 mg/dL     Total Cholesterol: 206 mg/dL  Good morning [name], your labs have returned and overall look good with exception of cholesterol.  Your cholesterol is still high, but continued recommendations to make lifestyle changes. Your LDL is above normal. The LDL is the bad cholesterol. Over time and in combination with inflammation and other factors, this contributes to plaque which in turn may lead to stroke and/or heart attack down the road. Sometimes high LDL is primarily genetic, and people might be eating all the right foods but still have high numbers. Other times, there is room for improvement in one's diet and eating healthier can bring this number down and potentially reduce one's risk of heart attack and/or stroke.   To reduce your LDL, Remember - more fruits and vegetables, more fish, and limit red meat and dairy products. More soy, nuts, beans, barley, lentils, oats and plant sterol ester enriched margarine instead of butter. I also encourage eliminating sugar and processed food. Remember, shop on the outside of the grocery store and visit your [employer]. If you would like to talk with me about dietary changes plus or minus medications for your cholesterol, please let me know. We should recheck your cholesterol in 6 months.  You are fantastic!!!  I am here any time. Keep being awesome!!  Thank you for allowing me to participate in your care. Kindest regards, [name]

## 2019-12-08 ENCOUNTER — Other Ambulatory Visit: Payer: Self-pay

## 2019-12-08 ENCOUNTER — Telehealth (INDEPENDENT_AMBULATORY_CARE_PROVIDER_SITE_OTHER): Payer: PRIVATE HEALTH INSURANCE | Admitting: Psychiatry

## 2019-12-08 ENCOUNTER — Encounter: Payer: Self-pay | Admitting: Psychiatry

## 2019-12-08 DIAGNOSIS — F411 Generalized anxiety disorder: Secondary | ICD-10-CM | POA: Diagnosis not present

## 2019-12-08 DIAGNOSIS — F0632 Mood disorder due to known physiological condition with major depressive-like episode: Secondary | ICD-10-CM | POA: Diagnosis not present

## 2019-12-08 DIAGNOSIS — F5105 Insomnia due to other mental disorder: Secondary | ICD-10-CM

## 2019-12-08 DIAGNOSIS — G35 Multiple sclerosis: Secondary | ICD-10-CM

## 2019-12-08 MED ORDER — TRAZODONE HCL 100 MG PO TABS: 100.0000 mg | ORAL_TABLET | Freq: Every day | ORAL | 1 refills | Status: DC

## 2019-12-08 MED ORDER — BUPROPION HCL ER (XL) 150 MG PO TB24: 150.0000 mg | ORAL_TABLET | Freq: Every day | ORAL | 1 refills | Status: DC

## 2019-12-08 NOTE — Progress Notes (Signed)
Virtual Visit via Video Note  I connected with [place] on 12/08/19 at  8:30 AM EDT by a video enabled telemedicine application and verified that I am speaking with the correct person using two identifiers.  Location Provider Location : [facility] Patient Location : Car  Participants: Patient , Provider    I discussed the limitations of evaluation and management by telemedicine and the availability of in person appointments. The patient expressed understanding and agreed to proceed.    I discussed the assessment and treatment plan with the patient. The patient was provided an opportunity to ask questions and all were answered. The patient agreed with the plan and demonstrated an understanding of the instructions.   The patient was advised to call back or seek an in-person evaluation if the symptoms worsen or if the condition fails to improve as anticipated.   [facility] MD OP Progress Note  12/08/2019 8:54 AM [name]  MRN:  [id number]  Chief Complaint:  Chief Complaint    Follow-up     HPI: [name] is a 39 year old Caucasian female who has a history of depression, insomnia, GAD, multiple sclerosis, migraine headaches, was evaluated by telemedicine today.  Patient today reports she is currently doing well with regards to her mood.  She is compliant on medications as prescribed.  She does report she has sleep restlessness.  She reports she is getting close to needing an infusion since it is been almost 6 months since her last ocrelizumab infusion.  She reports that she has does have intense leg pain which does make her sleep restless at night.  She takes 40 mg of baclofen daily which helps to some extent.  She takes a higher dosage of trazodone which also helps.  She wants to stay on this dosage.  She is planning to talk to her neurologist to give her something for her leg pain.  She reports she is currently planning to move into her new home.   It is getting rennovated.  She reports her DBT training is going well.  Patient denies any suicidality, homicidality or perceptual disturbances.  Patient denies any other concerns today.  Visit Diagnosis:    ICD-10-CM   1. 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 GAD (generalized anxiety disorder)  F41.1   3. Insomnia due to mental disorder  F51.05 traZODone (DESYREL) 100 MG tablet    Past Psychiatric History: I have reviewed past psychiatric history from my progress note from 05/21/2018.  Past trials of Zoloft, Adderall, trazodone  Past Medical History:  Past Medical History:  Diagnosis Date  . Depression   . MS (multiple sclerosis) ([place])   . Multiple sclerosis ([place])    History reviewed. No pertinent surgical history.  Family Psychiatric History: I have reviewed family psychiatric history from my progress note from 05/21/2018  Family History:  Family History  Problem Relation Age of Onset  . Multiple myeloma Mother   . Multiple sclerosis Mother   . Obesity Sister   . Anxiety disorder Daughter   . Multiple sclerosis Maternal Aunt     Social History: Reviewed social history from my progress note on 05/21/2018 Social History   Socioeconomic History  . Marital status: Married    Spouse name: steven ambrose  . Number of children: 3  . Years of education: Not on file  . Highest education level: Bachelor's degree (  e.g., BA, AB, BS)  Occupational History  . Not on file  Tobacco Use  . Smoking status: Never Smoker  . Smokeless tobacco: Never Used  Vaping Use  . Vaping Use: Never used  Substance and Sexual Activity  . Alcohol use: Not Currently    Alcohol/week: 0.0 standard drinks    Comment: occasional/fim  . Drug use: No  . Sexual activity: Yes    Birth control/protection: I.U.D.  Other Topics Concern  . Not on file  Social History Narrative  .  Not on file   Social Determinants of Health   Financial Resource Strain:   . Difficulty of Paying Living Expenses: Not on file  Food Insecurity:   . Worried About Charity fundraiser in the Last Year: Not on file  . Ran Out of Food in the Last Year: Not on file  Transportation Needs:   . Lack of Transportation (Medical): Not on file  . Lack of Transportation (Non-Medical): Not on file  Physical Activity:   . Days of Exercise per Week: Not on file  . Minutes of Exercise per Session: Not on file  Stress:   . Feeling of Stress : Not on file  Social Connections:   . Frequency of Communication with Friends and Family: Not on file  . Frequency of Social Gatherings with Friends and Family: Not on file  . Attends Religious Services: Not on file  . Active Member of Clubs or Organizations: Not on file  . Attends Archivist Meetings: Not on file  . Marital Status: Not on file    Allergies:  Allergies  Allergen Reactions  . Flexeril [Cyclobenzaprine]     Drowsy     Metabolic Disorder Labs: No results found for: HGBA1C, MPG No results found for: PROLACTIN Lab Results  Component Value Date   CHOL 206 (H) 12/06/2019   TRIG 187 (H) 12/06/2019   HDL 55 12/06/2019   CHOLHDL 2.6 12/02/2017   LDLCALC 118 (H) 12/06/2019   LDLCALC 83 12/04/2018   Lab Results  Component Value Date   TSH 1.090 12/06/2019   TSH 1.120 12/04/2018    Therapeutic Level Labs: No results found for: LITHIUM No results found for: VALPROATE No components found for:  CBMZ  Current Medications: Current Outpatient Medications  Medication Sig Dispense Refill  . amphetamine-dextroamphetamine (ADDERALL XR) 30 MG 24 hr capsule Take 1 capsule (30 mg total) by mouth daily. 30 capsule 0  . amphetamine-dextroamphetamine (ADDERALL) 10 MG tablet Take one or two po in the aftrernoon 60 tablet 0  . baclofen (LIORESAL) 10 MG tablet TAKE 1 TABLET BY MOUTH FOUR TIMES A DAY AS NEEDED FOR MUSCLE SPASMS 360 tablet 3   . buPROPion (WELLBUTRIN XL) 150 MG 24 hr tablet Take 1 tablet (150 mg total) by mouth daily. 90 tablet 1  . Cholecalciferol (VITAMIN D-1000 MAX ST) 1000 units tablet Take 5,000 Units by mouth daily.     Marland Kitchen dalfampridine 10 MG TB12 47m by mouth three times a day (Patient not taking: Reported on 12/06/2019) 270 tablet 3  . fluticasone (FLONASE) 50 MCG/ACT nasal spray SPRAY 2 SPRAYS INTO EACH NOSTRIL EVERY DAY    . ibuprofen (ADVIL) 800 MG tablet Take 1 tablet (800 mg total) by mouth 3 (three) times daily. 21 tablet 0  . ocrelizumab 600 mg in sodium chloride 0.9 % 500 mL Inject 600 mg into the vein every 6 (six) months.    . propranolol (INDERAL) 10 MG tablet TAKE 1 TABLET (10  MG TOTAL) BY MOUTH DAILY AS NEEDED. FOR SEVERE ANXIETY ATTACK 30 tablet 1  . sertraline (ZOLOFT) 100 MG tablet TAKE 2 TABLETS BY MOUTH EVERY DAY 180 tablet 1  . topiramate (TOPAMAX) 100 MG tablet TAKE 1 TABLET BY MOUTH EVERYDAY AT BEDTIME 90 tablet 3  . traZODone (DESYREL) 100 MG tablet Take 1 tablet (100 mg total) by mouth at bedtime. 90 tablet 1  . triamcinolone cream (KENALOG) 0.1 % Apply 1 application topically 2 (two) times daily. 30 g 0   No current facility-administered medications for this visit.     Musculoskeletal: Strength & Muscle Tone: UTA Gait & Station: Seated Patient leans: N/A  Psychiatric Specialty Exam: Review of Systems  Musculoskeletal:       Leg pain  Psychiatric/Behavioral: Positive for sleep disturbance.  All other systems reviewed and are negative.   There were no vitals taken for this visit.There is no height or weight on file to calculate BMI.  General Appearance: Casual  Eye Contact:  Fair  Speech:  Clear and Coherent  Volume:  Normal  Mood:  Euthymic  Affect:  Congruent  Thought Process:  Goal Directed and Descriptions of Associations: Intact  Orientation:  Full (Time, Place, and Person)  Thought Content: Logical   Suicidal Thoughts:  No  Homicidal Thoughts:  No  Memory:   Immediate;   Fair Recent;   Fair Remote;   Fair  Judgement:  Fair  Insight:  Fair  Psychomotor Activity:  Normal  Concentration:  Concentration: Fair and Attention Span: Fair  Recall:  [employer] of Knowledge: Fair  Language: Fair  Akathisia:  No  Handed:  Right  AIMS (if indicated): UTA  Assets:  Communication Skills Desire for Improvement Housing Social Support  ADL's:  Intact  Cognition: WNL  Sleep:  restless   Screenings: GAD-7     Office Visit from 06/04/2019 in [facility] Visit from 12/04/2018 in [facility] Visit from 06/03/2018 in [facility] Visit from 04/03/2018 in [facility] Visit from 12/02/2017 in [facility]  Total GAD-7 Score _0 PHQ2-9     Office Visit from 12/06/2019 in [facility] Visit from 06/04/2019 in [facility] Visit from 03/15/2019 in [facility] Visit from 12/04/2018 in [facility] Visit from 06/03/2018 in [facility]  PHQ-2 Total Score _1 PHQ-9 Total Score _2 Assessment and Plan: [name] is a 39 year old Caucasian female, married, lives in [place], has a history of depression, anxiety, MS, fatigue, insomnia was evaluated by telemedicine today.  Patient is biologically predisposed given her family history, her mother's diagnosis.  Patient with psychosocial stressors of recent COVID-19 infection, being in school, medical problems.  Patient however is currently stable with regards to her mood however does have sleep problems due to her leg pain due to MS.  Discussed plan as noted below.  Plan Depression secondary to medical condition-stable Wellbutrin XL 150 mg daily Zoloft 200 mg daily  Insomnia-restless Trazodone 100 mg p.o. nightly She will also talk to neurology for treatment for leg  pain.  GAD-stable Propranolol 10 mg p.o. daily as needed for anxiety attacks Zoloft as prescribed  Follow-up in clinic in 3 months or sooner  I have spent atleast 20 minutes face to face by video with patient today. More than  50 % of the time was spent for preparing to see the patient ( e.g., review of test, records ),ordering medications and test ,psychoeducation and supportive psychotherapy and care coordination,as well as documenting clinical information in electronic health record. This note was generated in part or whole with voice recognition software. Voice recognition is usually quite accurate but there are transcription errors that can and very often do occur. I apologize for any typographical errors that were not detected and corrected.        Ursula Alert, MD 12/09/2019, 8:22 AM

## 2019-12-14 ENCOUNTER — Encounter: Payer: Self-pay | Admitting: Nurse Practitioner

## 2019-12-14 ENCOUNTER — Ambulatory Visit (INDEPENDENT_AMBULATORY_CARE_PROVIDER_SITE_OTHER): Payer: PRIVATE HEALTH INSURANCE | Admitting: Nurse Practitioner

## 2019-12-14 ENCOUNTER — Other Ambulatory Visit: Payer: Self-pay

## 2019-12-14 DIAGNOSIS — M25512 Pain in left shoulder: Secondary | ICD-10-CM

## 2019-12-14 NOTE — Assessment & Plan Note (Signed)
Acute, x 24 hours.  Suspect impingement syndrome based on exam findings and HPI.  Is having moderate relief with NSAID at home.  Recommend continue this regimen and add on Icy/Hot Lidocaine patches + alternating heat and ice.  Recommend resting extremity as much as possible.  She is going to contact Dr. Epimenio Foot and ask about steroid infusion which has benefited her before due to her underlying MS and increased symptoms at this time.  Is to return to clinic for worsening or ongoing pain -- could consider steroid taper, imaging, and PT if ongoing.

## 2019-12-14 NOTE — Progress Notes (Signed)
BP 124/70   Pulse 91   Temp 98.1 F (36.7 C) (Oral)   Ht 5\' 2"  (1.575 m)   Wt 194 lb (88 kg)   SpO2 100%   BMI 35.48 kg/m    Subjective:    Patient ID: , female    DOB: 02/21/1980, 39 y.o.   MRN: [id number]  HPI: [name] is a 39 y.o. female  Chief Complaint  Patient presents with  . Shoulder Pain    Started yesterday, cant sleep, got really bad last night.    SHOULDER PAIN Started hurting yesterday to left shoulder.  Has had left wrist pain on and off for months, plays guitar, but this has improved with less playing.  Could not sleep pain became worse at night.  Is her dominant side.  Duration: days Involved shoulder: left Mechanism of injury: unknown Location: lateral Onset:sudden Severity: 8/10  Quality:  dull, aching and throbbing Frequency: intermittent Radiation: yes down lateral aspect of arm into forearm Aggravating factors: movement, writing, using hands to text Alleviating factors: NSAIDs and rest  Status: stable Treatments attempted: rest and ibuprofen  Relief with NSAIDs?:  moderate Weakness: no Numbness: no Decreased grip strength: no Redness: no Swelling: no Bruising: no Fevers: no  Relevant past medical, surgical, family and social history reviewed and updated as indicated. Interim medical history since our last visit reviewed. Allergies and medications reviewed and updated.  Review of Systems  Constitutional: Negative for activity change, appetite change, diaphoresis, fatigue and fever.  Respiratory: Negative for cough, chest tightness and shortness of breath.   Cardiovascular: Negative for chest pain, palpitations and leg swelling.  Gastrointestinal: Negative.   Musculoskeletal: Positive for arthralgias.  Neurological: Negative.   Psychiatric/Behavioral: Negative.     Per HPI unless specifically indicated above     Objective:    BP 124/70   Pulse 91   Temp 98.1 F (36.7 C) (Oral)   Ht 5\' 2"   (1.575 m)   Wt 194 lb (88 kg)   SpO2 100%   BMI 35.48 kg/m   Wt Readings from Last 3 Encounters:  12/14/19 194 lb (88 kg)  12/06/19 194 lb 6.4 oz (88.2 kg)  08/25/19 189 lb 8 oz (86 kg)    Physical Exam Vitals and nursing note reviewed.  Constitutional:      General: She is awake. She is not in acute distress.    Appearance: She is well-developed and well-groomed. She is obese. She is not ill-appearing.  HENT:     Head: Normocephalic.     Right Ear: Hearing normal.     Left Ear: Hearing normal.  Eyes:     General: Lids are normal.        Right eye: No discharge.        Left eye: No discharge.     Conjunctiva/sclera: Conjunctivae normal.     Pupils: Pupils are equal, round, and reactive to light.  Cardiovascular:     Rate and Rhythm: Normal rate and regular rhythm.     Heart sounds: Normal heart sounds. No murmur heard.  No gallop.   Pulmonary:     Effort: Pulmonary effort is normal. No accessory muscle usage or respiratory distress.     Breath sounds: Normal breath sounds.  Abdominal:     General: Bowel sounds are normal.     Palpations: Abdomen is soft.  Musculoskeletal:     Right shoulder: Normal.     Left shoulder: Tenderness present. No swelling, laceration, bony  tenderness or crepitus. Normal range of motion.     Right upper arm: Normal.     Left upper arm: Normal.     Cervical back: Normal range of motion and neck supple.     Right lower leg: No edema.     Left lower leg: No edema.     Comments: No rashes noted.  Normal ROM BUE.  Mild tenderness reported on palpation posterior shoulder.  Discomfort, mild, reported with flexion, extension, abduction of shoulder, although full ROM present.  Mild decrease strength noted left shoulder.  Positive Neer and Hawkins.  Skin:    General: Skin is warm and dry.  Neurological:     Mental Status: She is alert and oriented to person, place, and time.  Psychiatric:        Attention and Perception: Attention normal.        Mood  and Affect: Mood normal.        Speech: Speech normal.        Behavior: Behavior normal. Behavior is cooperative.        Thought Content: Thought content normal.     Results for orders placed or performed in visit on 12/06/19  CBC with Differential/Platelet  Result Value Ref Range   WBC 8.8 3.4 - 10.8 x10E3/uL   RBC 5.09 3.77 - 5.28 x10E6/uL   Hemoglobin 14.4 11.1 - 15.9 g/dL   Hematocrit 84.1 66.0 - 46.6 %   MCV 84 79 - 97 fL   MCH 28.3 26.6 - 33.0 pg   MCHC 33.6 31 - 35 g/dL   RDW 63.0 16.0 - 10.9 %   Platelets 414 150 - 450 x10E3/uL   Neutrophils 77 Not Estab. %   Lymphs 14 Not Estab. %   Monocytes 7 Not Estab. %   Eos 1 Not Estab. %   Basos 1 Not Estab. %   Neutrophils Absolute 6.8 1.40 - 7.00 x10E3/uL   Lymphocytes Absolute 1.2 0 - 3 x10E3/uL   Monocytes Absolute 0.6 0 - 0 x10E3/uL   EOS (ABSOLUTE) 0.1 0.0 - 0.4 x10E3/uL   Basophils Absolute 0.0 0 - 0 x10E3/uL   Immature Granulocytes 0 Not Estab. %   Immature Grans (Abs) 0.0 0.0 - 0.1 x10E3/uL  Comprehensive metabolic panel  Result Value Ref Range   Glucose 75 65 - 99 mg/dL   BUN 9 6 - 24 mg/dL   Creatinine, Ser 3.23 0.57 - 1.00 mg/dL   GFR calc non Af Amer 93 >59 mL/min/1.73   GFR calc Af Amer 108 >59 mL/min/1.73   BUN/Creatinine Ratio 11 9 - 23   Sodium 141 134 - 144 mmol/L   Potassium 3.8 3.5 - 5.2 mmol/L   Chloride 103 96 - 106 mmol/L   CO2 24 20 - 29 mmol/L   Calcium 8.9 8.7 - 10.2 mg/dL   Total Protein 6.8 6.0 - 8.5 g/dL   Albumin 4.6 3.8 - 4.8 g/dL   Globulin, Total 2.2 1.5 - 4.5 g/dL   Albumin/Globulin Ratio 2.1 1.2 - 2.2   Bilirubin Total <0.2 0.0 - 1.2 mg/dL   Alkaline Phosphatase 122 (H) 44 - 121 IU/L   AST 15 0 - 40 IU/L   ALT 12 0 - 32 IU/L  TSH  Result Value Ref Range   TSH 1.090 0.450 - 4.500 uIU/mL  Lipid Panel w/o Chol/HDL Ratio  Result Value Ref Range   Cholesterol, Total 206 (H) 100 - 199 mg/dL   Triglycerides 557 (H) 0 - 149 mg/dL  HDL 55 >39 mg/dL   VLDL Cholesterol Cal 33 5 - 40  mg/dL   LDL Chol Calc (NIH) 712 (H) 0 - 99 mg/dL  VITAMIN D 25 Hydroxy (Vit-D Deficiency, Fractures)  Result Value Ref Range   Vit D, 25-Hydroxy 43.2 30.0 - 100.0 ng/mL      Assessment & Plan:   Problem List Items Addressed This Visit      Other   Left shoulder pain    Acute, x 24 hours.  Suspect impingement syndrome based on exam findings and HPI.  Is having moderate relief with NSAID at home.  Recommend continue this regimen and add on Icy/Hot Lidocaine patches + alternating heat and ice.  Recommend resting extremity as much as possible.  She is going to contact Dr. Epimenio Foot and ask about steroid infusion which has benefited her before due to her underlying MS and increased symptoms at this time.  Is to return to clinic for worsening or ongoing pain -- could consider steroid taper, imaging, and PT if ongoing.            Follow up plan: Return if symptoms worsen or fail to improve.

## 2019-12-14 NOTE — Patient Instructions (Signed)

## 2020-01-06 ENCOUNTER — Other Ambulatory Visit: Payer: Self-pay

## 2020-01-06 MED ORDER — AMPHETAMINE-DEXTROAMPHETAMINE 10 MG PO TABS: ORAL_TABLET | ORAL | 0 refills | Status: DC

## 2020-01-06 MED ORDER — AMPHETAMINE-DEXTROAMPHET ER 30 MG PO CP24: 30.0000 mg | ORAL_CAPSULE | Freq: Every day | ORAL | 0 refills | Status: DC

## 2020-01-06 NOTE — Telephone Encounter (Signed)
Called [facility] at [phone]. She is listed under last name "[name]" for them. She last refilled adderall 10mg  08/25/19 #60 30mg  XR 12/03/19 #30. Last seen 08/25/19 and next f/u 02/28/20.

## 2020-01-06 NOTE — Telephone Encounter (Signed)
Called [facility] at [phone]. She is listed under last name "Lee" for them. She last refilled adderall 10mg 08/25/19 #60 30mg XR 12/03/19 #30. Last seen 08/25/19 and next f/u 02/28/20.  

## 2020-01-12 ENCOUNTER — Other Ambulatory Visit: Payer: Self-pay

## 2020-01-12 MED ORDER — AMPHETAMINE-DEXTROAMPHET ER 30 MG PO CP24: 30.0000 mg | ORAL_CAPSULE | Freq: Every day | ORAL | 0 refills | Status: DC

## 2020-02-16 ENCOUNTER — Encounter: Payer: Self-pay | Admitting: Nurse Practitioner

## 2020-02-16 ENCOUNTER — Telehealth: Payer: Self-pay

## 2020-02-16 ENCOUNTER — Telehealth: Payer: PRIVATE HEALTH INSURANCE | Admitting: Psychiatry

## 2020-02-16 ENCOUNTER — Telehealth (INDEPENDENT_AMBULATORY_CARE_PROVIDER_SITE_OTHER): Payer: BC Managed Care – PPO | Admitting: Nurse Practitioner

## 2020-02-16 DIAGNOSIS — J3489 Other specified disorders of nose and nasal sinuses: Secondary | ICD-10-CM

## 2020-02-16 LAB — VERITOR FLU A/B WAIVED
Influenza A: NEGATIVE
Influenza B: NEGATIVE

## 2020-02-16 MED ORDER — HYDROCOD POLST-CPM POLST ER 10-8 MG/5ML PO SUER: 5.0000 mL | Freq: Two times a day (BID) | ORAL | 0 refills | Status: DC | PRN

## 2020-02-16 MED ORDER — ALBUTEROL SULFATE HFA 108 (90 BASE) MCG/ACT IN AERS
2.0000 | INHALATION_SPRAY | Freq: Four times a day (QID) | RESPIRATORY_TRACT | 0 refills | Status: DC | PRN
Start: 2020-02-16 — End: 2020-03-09

## 2020-02-16 MED ORDER — PREDNISONE 20 MG PO TABS
40.0000 mg | ORAL_TABLET | Freq: Every day | ORAL | 0 refills | Status: AC
Start: 2020-02-16 — End: 2020-02-21

## 2020-02-16 NOTE — Telephone Encounter (Signed)
Called pt to schedule virtual and swab per brittany no answer left vm

## 2020-02-16 NOTE — Patient Instructions (Signed)
3 Key Steps to Take While Waiting for Your COVID-19 Test Result To help stop the spread of COVID-19, take these 3 key steps NOW while waiting for your test results: 1. Stay home and monitor your health. Stay home and monitor your health to help protect your friends, family, and others from possibly getting COVID-19 from you. Stay home and away from others:  If possible, stay away from others, especially people who are at higher risk for getting very sick from COVID-19, such as older adults and people with other medical conditions.  If you have been in contact with someone with COVID-19, stay home and away from others for 14 days after your last contact with that person. Follow the recommendations of your local public health department if you need to quarantine.  If you have a fever, cough or other symptoms of COVID-19, stay home and away from others (except to get medical care). Monitor your health:  Watch for fever, cough, shortness of breath, or other symptoms of COVID-19. Remember, symptoms may appear 2-14 days after exposure to COVID-19 and can include: ? Fever or chills ? Cough ? Shortness of breath or difficulty breathing ? Tiredness ? Muscle or body aches ? Headache ? New loss of taste or smell ? Sore throat ? Congestion or runny nose ? Nausea or vomiting ? Diarrhea 2. Think about the people you have recently been around. If you are diagnosed with COVID-19, a public health worker may call you to check on your health, discuss who you have been around, and ask where you spent time while you may have been able to spread COVID-19 to others. While you wait for your COVID-19 test result, think about everyone you have been around recently. This will be important information to give health workers if your test is positive.  Complete the information on the back of this page to help you remember everyone you have been around.  3. Answer the phone call from the health department. If a public  health worker calls you, answer the call to help slow the spread of COVID-19 in your community.  Discussions with health department staff are confidential. This means that your personal and medical information will be kept private and only shared with those who may need to know, like your health care provider.  Your name will not be shared with those you came in contact with. The health department will only notify people you were in close contact with (within 6 feet for more than 15 minutes) that they might have been exposed to COVID-19. Think about the people you have recently been around If you test positive and are diagnosed with COVID-19, someone from the health department may call to check-in on your health, discuss who you have been around, and ask where you spent time while you may have been able to spread COVID-19 to others. This form can help you think about people you have recently been around so you will be ready if a public health worker calls you. Things to think about. Have you:  Gone to work or school?  Gotten together with others (eaten out at a restaurant, gone out for drinks, exercised with others or gone to a gym, had friends or family over to your house, volunteered, gone to a party, pool, or park)?  Gone to a store in person (e.g., grocery store, mall)?  Gone to in-person appointments (e.g., salon, barber, doctor's or dentist's office)?  Ridden in a car with others (e.g., rideshare) or   taken public transportation?  Been inside a church, synagogue, mosque or other places of worship? Who lives with you?  ______________________________________________________________________  ______________________________________________________________________  ______________________________________________________________________  ______________________________________________________________________ Who have you been around (less than 6 feet for a total of 15 minutes or more) in the  last 10 days? (You may have more people to list than the space provided. If so, write on the front of this sheet or a separate piece of paper.) Name ______________________________________________  Phone number ____________________________________  Date you last saw them _____________________________  Where you last saw them ________________________________________________ Name ______________________________________________  Phone number ____________________________________  Date you last saw them _____________________________  Where you last saw them ________________________________________________ Name ______________________________________________  Phone number ____________________________________  Date you last saw them _____________________________  Where you last saw them ________________________________________________ Name ______________________________________________  Phone number ____________________________________  Date you last saw them _____________________________  Where you last saw them ________________________________________________ Name ______________________________________________  Phone number ____________________________________  Date you last saw them _____________________________  Where you last saw them ________________________________________________ What have you done in the last 10 days with other people? Activity _____________________________________________  Location _________________________________________  Date ____________________________________________ Activity _____________________________________________  Location _________________________________________  Date ____________________________________________ Activity _____________________________________________  Location _________________________________________  Date ____________________________________________ Activity _____________________________________________  Location  _________________________________________  Date ____________________________________________ Activity _____________________________________________  Location _________________________________________  Date ____________________________________________ Centers for Disease Control and Prevention cdc.gov/coronavirus 08/13/2019 This information is not intended to replace advice given to you by your health care provider. Make sure you discuss any questions you have with your health care provider. Document Revised: 12/06/2019 Document Reviewed: 12/06/2019 Elsevier Patient Education  2021 Elsevier Inc.  

## 2020-02-16 NOTE — Addendum Note (Signed)
Addended by: Enedina Finner on: 02/16/2020 04:09 PM   Modules accepted: Orders

## 2020-02-16 NOTE — Progress Notes (Signed)
There were no vitals taken for this visit.   Subjective:    Patient ID: [name], female    DOB: January 10, 1981, 40 y.o.   MRN: [id number]  HPI: [name] is a 40 y.o. female  Chief Complaint  Patient presents with  . URI    Pt states she has been feeling fatigued, cough, chills and body aches since 02/13/20. States she started with a dry cough, and woke up the next morning feeling very bad.     . This visit was completed via MyChart due to the restrictions of the COVID-19 pandemic. All issues as above were discussed and addressed. Physical exam was done as above through visual confirmation on MyChart. If it was felt that the patient should be evaluated in the office, they were directed there. The patient verbally consented to this visit. . Location of the patient: home . Location of the provider: work . Those involved with this call:  . Provider: Aura Dials, DNP . CMA: Wilhemena Durie, CMA . Front Desk/Registration: Harriet Pho  . Time spent on call: 21 minutes with patient face to face via video conference. More than 50% of this time was spent in counseling and coordination of care. 15 minutes total spent in review of patient's record and preparation of their chart.  . I verified patient identity using two factors (patient name and date of birth). Patient consents verbally to being seen via telemedicine visit today.     UPPER RESPIRATORY TRACT INFECTION Reports feeling symptoms since 02/13/20 in the evening.  Having increased fatigue, cough, chills, and myalgias.  Started out symptoms with a dry cough and worsened from there on Monday.  No loss of taste or smell.  Attended funeral on Friday and sang at funeral.  Has history of Covid infection in August 2021, received MAB at that time.  She is not vaccinated.  Is receiving infusions for her MS at this time -- scheduled for next Wednesday.   Fever: chills and myalgias -- has not taken temp -- wakes up  sweaty Cough: yes dry cough initially and now some productive cough + sinus drainage Shortness of breath: no Wheezing: no Chest pain: no Chest tightness: yes Chest congestion: yes Nasal congestion: yes Runny nose: yes Post nasal drip: yes Sneezing: no Sore throat: a little bit with drainage Swollen glands: no Sinus pressure: yes Headache: yes Face pain: no Toothache: no Ear pain: none Ear pressure: none Eyes red/itching:no Eye drainage/crusting: no  Vomiting: no Rash: no Fatigue: yes Sick contacts: son was sick last weekend Strep contacts: no  Context: fluctuating Recurrent sinusitis: no Relief with OTC cold/cough medications: no  Treatments attempted: cold/sinus   Relevant past medical, surgical, family and social history reviewed and updated as indicated. Interim medical history since our last visit reviewed. Allergies and medications reviewed and updated.  Review of Systems  Constitutional: Positive for fatigue. Negative for activity change, appetite change, chills and fever.  HENT: Positive for congestion, postnasal drip, rhinorrhea, sinus pressure and sore throat. Negative for ear discharge, ear pain, facial swelling, sinus pain, sneezing and voice change.   Eyes: Negative for pain and visual disturbance.  Respiratory: Positive for cough and chest tightness. Negative for shortness of breath and wheezing.   Cardiovascular: Negative for chest pain, palpitations and leg swelling.  Gastrointestinal: Negative.   Endocrine: Negative.   Musculoskeletal: Positive for myalgias.  Neurological: Positive for headaches. Negative for dizziness and numbness.  Psychiatric/Behavioral: Negative.     Per HPI unless  specifically indicated above     Objective:    There were no vitals taken for this visit.  Wt Readings from Last 3 Encounters:  12/14/19 194 lb (88 kg)  12/06/19 194 lb 6.4 oz (88.2 kg)  08/25/19 189 lb 8 oz (86 kg)    Physical Exam Vitals and nursing note  reviewed.  Constitutional:      General: She is awake. She is not in acute distress.    Appearance: She is well-developed. She is ill-appearing. She is not toxic-appearing.  HENT:     Head: Normocephalic.     Right Ear: Hearing normal.     Left Ear: Hearing normal.  Eyes:     General: Lids are normal.        Right eye: No discharge.        Left eye: No discharge.     Conjunctiva/sclera: Conjunctivae normal.  Pulmonary:     Effort: Pulmonary effort is normal. No accessory muscle usage or respiratory distress.     Comments: Unable to auscultate due to virtual exam only.  No SOB with talking noted. Musculoskeletal:     Cervical back: Normal range of motion.  Neurological:     Mental Status: She is alert and oriented to person, place, and time.  Psychiatric:        Attention and Perception: Attention normal.        Mood and Affect: Mood normal.        Behavior: Behavior normal. Behavior is cooperative.        Thought Content: Thought content normal.        Judgment: Judgment normal.     Results for orders placed or performed in visit on 12/06/19  CBC with Differential/Platelet  Result Value Ref Range   WBC 8.8 3.4 - 10.8 x10E3/uL   RBC 5.09 3.77 - 5.28 x10E6/uL   Hemoglobin 14.4 11.1 - 15.9 g/dL   Hematocrit 92.1 19.4 - 46.6 %   MCV 84 79 - 97 fL   MCH 28.3 26.6 - 33.0 pg   MCHC 33.6 31.5 - 35.7 g/dL   RDW 17.4 08.1 - 44.8 %   Platelets 414 150 - 450 x10E3/uL   Neutrophils 77 Not Estab. %   Lymphs 14 Not Estab. %   Monocytes 7 Not Estab. %   Eos 1 Not Estab. %   Basos 1 Not Estab. %   Neutrophils Absolute 6.8 1.4 - 7.0 x10E3/uL   Lymphocytes Absolute 1.2 0.7 - 3.1 x10E3/uL   Monocytes Absolute 0.6 0.1 - 0.9 x10E3/uL   EOS (ABSOLUTE) 0.1 0.0 - 0.4 x10E3/uL   Basophils Absolute 0.0 0.0 - 0.2 x10E3/uL   Immature Granulocytes 0 Not Estab. %   Immature Grans (Abs) 0.0 0.0 - 0.1 x10E3/uL  Comprehensive metabolic panel  Result Value Ref Range   Glucose 75 65 - 99 mg/dL    BUN 9 6 - 24 mg/dL   Creatinine, Ser 1.85 0.57 - 1.00 mg/dL   GFR calc non Af Amer 93 >59 mL/min/1.73   GFR calc Af Amer 108 >59 mL/min/1.73   BUN/Creatinine Ratio 11 9 - 23   Sodium 141 134 - 144 mmol/L   Potassium 3.8 3.5 - 5.2 mmol/L   Chloride 103 96 - 106 mmol/L   CO2 24 20 - 29 mmol/L   Calcium 8.9 8.7 - 10.2 mg/dL   Total Protein 6.8 6.0 - 8.5 g/dL   Albumin 4.6 3.8 - 4.8 g/dL   Globulin, Total 2.2 1.5 - 4.5  g/dL   Albumin/Globulin Ratio 2.1 1.2 - 2.2   Bilirubin Total <0.2 0.0 - 1.2 mg/dL   Alkaline Phosphatase 122 (H) 44 - 121 IU/L   AST 15 0 - 40 IU/L   ALT 12 0 - 32 IU/L  TSH  Result Value Ref Range   TSH 1.090 0.450 - 4.500 uIU/mL  Lipid Panel w/o Chol/HDL Ratio  Result Value Ref Range   Cholesterol, Total 206 (H) 100 - 199 mg/dL   Triglycerides 119 (H) 0 - 149 mg/dL   HDL 55 >14 mg/dL   VLDL Cholesterol Cal 33 5 - 40 mg/dL   LDL Chol Calc (NIH) 782 (H) 0 - 99 mg/dL  VITAMIN D 25 Hydroxy (Vit-D Deficiency, Fractures)  Result Value Ref Range   Vit D, 25-Hydroxy 43.2 30.0 - 100.0 ng/mL      Assessment & Plan:   Problem List Items Addressed This Visit      Other   Sinus pressure - Primary    Acute with symptoms starting 02/13/20.  Will obtain Covid and flu testing today, is unvaccinated at this time.  She would like vaccines, but due to MAB infusion and her MS infusions has not been able to obtain due to scheduling.  History of Covid in August 2021. At this time will send in scripts for Prednisone burst, Albuterol, and Tussionex.  Recommend she self quarantine until results of testing returned and symptoms have improved, if positive will need to continue quarantine for total of 10 days.  Recommend lots of fluid and rest at home + continue OTC medications for symptom management.  If any worsening SOB or presentation of CP immediately got to ER.  If ongoing symptoms then return to office.      Relevant Orders   Veritor Flu A/B Waived   Novel Coronavirus, NAA ([facility])       I discussed the assessment and treatment plan with the patient. The patient was provided an opportunity to ask questions and all were answered. The patient agreed with the plan and demonstrated an understanding of the instructions.   The patient was advised to call back or seek an in-person evaluation if the symptoms worsen or if the condition fails to improve as anticipated.   I provided 21+ minutes of time during this encounter.  Follow up plan: Return if symptoms worsen or fail to improve.

## 2020-02-16 NOTE — Assessment & Plan Note (Signed)
Acute with symptoms starting 02/13/20.  Will obtain Covid and flu testing today, is unvaccinated at this time.  She would like vaccines, but due to MAB infusion and her MS infusions has not been able to obtain due to scheduling.  History of Covid in August 2021. At this time will send in scripts for Prednisone burst, Albuterol, and Tussionex.  Recommend she self quarantine until results of testing returned and symptoms have improved, if positive will need to continue quarantine for total of 10 days.  Recommend lots of fluid and rest at home + continue OTC medications for symptom management.  If any worsening SOB or presentation of CP immediately got to ER.  If ongoing symptoms then return to office.

## 2020-02-18 LAB — SARS-COV-2, NAA 2 DAY TAT

## 2020-02-18 LAB — NOVEL CORONAVIRUS, NAA: SARS-CoV-2, NAA: DETECTED — AB

## 2020-02-23 ENCOUNTER — Telehealth: Payer: Self-pay | Admitting: Neurology

## 2020-02-23 ENCOUNTER — Telehealth: Payer: Self-pay | Admitting: *Deleted

## 2020-02-23 ENCOUNTER — Other Ambulatory Visit: Payer: Self-pay

## 2020-02-23 MED ORDER — AMPHETAMINE-DEXTROAMPHET ER 30 MG PO CP24: 30.0000 mg | ORAL_CAPSULE | Freq: Every day | ORAL | 0 refills | Status: DC

## 2020-02-23 NOTE — Telephone Encounter (Signed)
She can keep her appt. She told me yesterday that her infusion was today and I told her she was ok to come for infusion.

## 2020-02-23 NOTE — Telephone Encounter (Signed)
[name] - Pt was asking on the reminder call if she should reschedule because she hasn't started the ocrevis due to new insurance or should she just come as a 6 month visit? I can call her back lmk if you need my assiytance. Thank you

## 2020-02-23 NOTE — Telephone Encounter (Signed)
Called Pt to advise to keep her appt for 1/24

## 2020-02-23 NOTE — Telephone Encounter (Signed)
Submitted PA on CMM. Key: [id number]. Waiting on determination from [employer].

## 2020-02-24 NOTE — Telephone Encounter (Signed)
Received fax from [employer] that PA is approved 02/23/20-02/23/23. PA# [facility] Plan [phone] Non-grandfathered [id number]

## 2020-02-28 ENCOUNTER — Other Ambulatory Visit: Payer: Self-pay

## 2020-02-28 ENCOUNTER — Telehealth: Payer: Self-pay | Admitting: *Deleted

## 2020-02-28 ENCOUNTER — Encounter: Payer: Self-pay | Admitting: Neurology

## 2020-02-28 ENCOUNTER — Ambulatory Visit: Payer: BC Managed Care – PPO | Admitting: Neurology

## 2020-02-28 VITALS — BP 128/89 | HR 125 | Ht 62.0 in | Wt 195.5 lb

## 2020-02-28 DIAGNOSIS — R269 Unspecified abnormalities of gait and mobility: Secondary | ICD-10-CM

## 2020-02-28 DIAGNOSIS — G35 Multiple sclerosis: Secondary | ICD-10-CM

## 2020-02-28 DIAGNOSIS — Z79899 Other long term (current) drug therapy: Secondary | ICD-10-CM

## 2020-02-28 DIAGNOSIS — R0683 Snoring: Secondary | ICD-10-CM | POA: Diagnosis not present

## 2020-02-28 DIAGNOSIS — G4719 Other hypersomnia: Secondary | ICD-10-CM | POA: Insufficient documentation

## 2020-02-28 DIAGNOSIS — F988 Other specified behavioral and emotional disorders with onset usually occurring in childhood and adolescence: Secondary | ICD-10-CM | POA: Diagnosis not present

## 2020-02-28 MED ORDER — ARMODAFINIL 200 MG PO TABS: ORAL_TABLET | ORAL | 5 refills | Status: DC

## 2020-02-28 NOTE — Telephone Encounter (Signed)
Submitted PA Armodafinil on CMM. Key: [id number]. PA Case ID: [id number] - Rx #: [id number]. Waiting on determination from [employer].

## 2020-02-28 NOTE — Telephone Encounter (Signed)
Received fax from [employer] that PA approved effective 02/28/20-02/27/21. PA# [facility] Plan [phone] Non-grandfathered [id number]

## 2020-02-28 NOTE — Progress Notes (Signed)
[facility]  PATIENT: [name] DOB: 10/12/1980  REFERRING DOCTOR OR PCP:  Dr. Jennings Books  [facility]) SOURCE: Patient, a couple MRI reports and images on PACS. Lab reports, notes from Dr. Brigitte Pulse  _________________________________   HISTORICAL  CHIEF COMPLAINT:  No chief complaint on file.   HISTORY OF PRESENT ILLNESS:  [name] is a 40 y.o. woman with relapsing remitting multiple sclerosis.     Update 02/28/2020: She had her last OCrevus in June.   Due to insurance issues, she has not had her infusion yet but will hopefully get approved soon.  She has tolerated it well.    She has been on Ocrevus x 2+ years.    She is walking about the same.   She has some leg stiffness.   She stumbles some.  She  stopped the Ampyra due to cost increase but it had helped.  She has a right foot drop. She also takes baclofen for spasticity with benefit.       She also has LBP and did som PT.    Leaning over a while increases her pain.    Her lower back spasms some when she sits a certain way, steps the wrong way or more active.     She needs to shift a lot.   Her left hand has become slower over there past year.  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      She has anterolisthesis and severe foraminal narrowing at  L5S1.   Mood is ok.   doing well.   She is on Wellbutrin.  She sees [employer].   She notes mild cognitive issues with reduced focus/attention, STM and processing.   She has noted more word finding diffiuculty.  She is trying to learn [place].  Adderall XR in the morning with additional IR later in the day has been helpful.   She sleeps poorly at night and her kids tell her she 'sounds like an Scientist, clinical (histocompatibility and immunogenetics)' at night.  Migraine headaches have been well on topiramate.   She had Covid last year and again last month. The second time was mild  while the first time wiped her out x weeks.    She is trying to lose weight and eat healthier.   Due to the back, exercise is difficulty.     EPWORTH SLEEPINESS SCALE  On a scale of 0 - 3 what is the chance of dozing:  Sitting and Reading:`   3 Watching TV:    3 Sitting inactive in a public place: 0 Passenger in car for one hour: 3 Lying down to rest in the afternoon: 3 Sitting and talking to someone: 0 Sitting quietly after lunch:  3 In a car, stopped in traffic:  1  Total (out of 24):   16/24  Moderate excessive daytime sleepiness.  MS history:   In late June or early July 2011, she had the onset of numbness that went up to her belly button. She presented to the emergency room. An MRI of the brain showed 1 periventricular focus with maybe a second subtle periventricular focus. An MRI of the cervical spine showed an enhancing lesion adjacent to C3-C4. The diagnosis of possible MS was made but the evidence was not enough to begin a disease modifying therapy. In 2015, she had the onset of similar symptoms and also had  decreased ability to use the left hand. An MRI of the brain at that time showed several new lesions not present in 2011, including a focus in the right middle cerebellar peduncle additionally, she had a lumbar puncture consistent with MS. Because of her symptoms, MRI changes and lumbar puncture results, she was diagnosed with clinical definite relapsing remitting MS and was started on Tecfidera. She has continued on Tecfidera. Over the past few months she has noted more difficulty with focus and concentration.     She also has had some change in vision more recently. Because of these newer symptoms, and MRI was repeated 04/27/2015. The MRI of the cervical spine showed no new lesions. though the MRI of the brain showed 2  lesions not present in 2015, one in the left juxtacortical white matter and one in the left deep white matter.  She had a small exacerbation in August 2017 and  received IV SOlu-Medrol.   MRI cervical spine 2017 an MRI of the brain show two spinal plaques, a definite rightt middle cerebellar peduncle plaque and a possible right pontine plaque and several foci in the hemispheres  MRI 01/2016 showed no new lesions.  She is JCV Ab positive.  She had an exacerbation in 2017 and early 2018 switch to ocrelizumab with her first doses in May 2018 (was on Gilenya).   She had another small exacerbation in July (right leg stiff/weak) and she received 3 days of IV site.  She went from using a cane to a walker for balance.  She tolerates the ocrelizumab well.     FH:  Her mom has MS and her MS stabilized after a bone marrow transplant for cancer.  MRI images MRI of the brain 09/24/2017 showed T2 hyperintense foci in the right middle cerebellar peduncle and hemispheres.  There were no changes compared to 09/08/2017.  MRI of the cervical spine 09/26/2017 showed foci within the spinal cord adjacent to C4 and C6-C7.  They were both present on an MRI from 04/04/2016.  There is mild spinal stenosis at C5-C6 due to disc protrusion.   REVIEW OF SYSTEMS: Constitutional: No fevers, chills, sweats, or change in appetite.  She has fatigue and insomnia. Eyes: No visual changes, double vision, eye pain Ear, nose and throat: No hearing loss, ear pain, nasal congestion, sore throat Cardiovascular: No chest pain, palpitations Respiratory: No shortness of breath at rest or with exertion.   No wheezes GastrointestinaI: No nausea, vomiting, diarrhea, abdominal pain, fecal incontinence Genitourinary:   She notes frequency, some hesitancy at times and nocturia. Musculoskeletal: No neck pain, back pain Integumentary: No rash, pruritus, skin lesions Neurological: as above Psychiatric: as above Endocrine: No palpitations, diaphoresis, change in appetite, change in weigh or increased thirst Hematologic/Lymphatic: No anemia, purpura, petechiae. Allergic/Immunologic: No itchy/runny eyes,  nasal congestion, recent allergic reactions, rashes  ALLERGIES: Allergies  Allergen Reactions  . Flexeril [Cyclobenzaprine]     Drowsy     HOME MEDICATIONS:  Current Outpatient Medications:  .  albuterol (VENTOLIN HFA) 108 (90 Base) MCG/ACT inhaler, Inhale 2 puffs into the lungs every 6 (six) hours as needed for wheezing or shortness of breath., Disp: 18 g, Rfl: 0 .  amphetamine-dextroamphetamine (ADDERALL XR) 30 MG 24 hr capsule, Take 1 capsule (30 mg total) by mouth daily., Disp: 30 capsule, Rfl: 0 .  amphetamine-dextroamphetamine (ADDERALL) 10 MG tablet, Take one or two po in the aftrernoon, Disp: 60 tablet, Rfl: 0 .  Armodafinil 200 MG TABS, One po qAM, Disp: 30  tablet, Rfl: 5 .  baclofen (LIORESAL) 10 MG tablet, TAKE 1 TABLET BY MOUTH FOUR TIMES A DAY AS NEEDED FOR MUSCLE SPASMS, Disp: 360 tablet, Rfl: 3 .  buPROPion (WELLBUTRIN XL) 150 MG 24 hr tablet, Take 1 tablet (150 mg total) by mouth daily., Disp: 90 tablet, Rfl: 1 .  chlorpheniramine-HYDROcodone (TUSSIONEX PENNKINETIC ER) 10-8 MG/5ML SUER, Take 5 mLs by mouth every 12 (twelve) hours as needed for cough., Disp: 60 mL, Rfl: 0 .  Cholecalciferol 25 MCG (1000 UT) tablet, Take 5,000 Units by mouth daily. , Disp: , Rfl:  .  fluticasone (FLONASE) 50 MCG/ACT nasal spray, SPRAY 2 SPRAYS INTO EACH NOSTRIL EVERY DAY, Disp: , Rfl:  .  ibuprofen (ADVIL) 800 MG tablet, Take 1 tablet (800 mg total) by mouth 3 (three) times daily., Disp: 21 tablet, Rfl: 0 .  ocrelizumab 600 mg in sodium chloride 0.9 % 500 mL, Inject 600 mg into the vein every 6 (six) months., Disp: , Rfl:  .  propranolol (INDERAL) 10 MG tablet, TAKE 1 TABLET (10 MG TOTAL) BY MOUTH DAILY AS NEEDED. FOR SEVERE ANXIETY ATTACK, Disp: 30 tablet, Rfl: 1 .  sertraline (ZOLOFT) 100 MG tablet, TAKE 2 TABLETS BY MOUTH EVERY DAY, Disp: 180 tablet, Rfl: 1 .  topiramate (TOPAMAX) 100 MG tablet, TAKE 1 TABLET BY MOUTH EVERYDAY AT BEDTIME, Disp: 90 tablet, Rfl: 3 .  traZODone (DESYREL) 100  MG tablet, Take 1 tablet (100 mg total) by mouth at bedtime., Disp: 90 tablet, Rfl: 1 .  triamcinolone cream (KENALOG) 0.1 %, Apply 1 application topically 2 (two) times daily., Disp: 30 g, Rfl: 0  PAST MEDICAL HISTORY: Past Medical History:  Diagnosis Date  . Depression   . MS (multiple sclerosis) ([place])   . Multiple sclerosis ([place])     PAST SURGICAL HISTORY: No past surgical history on file.  FAMILY HISTORY: Family History  Problem Relation Age of Onset  . Multiple myeloma Mother   . Multiple sclerosis Mother   . Obesity Sister   . Anxiety disorder Daughter   . Multiple sclerosis Maternal Aunt     SOCIAL HISTORY:  Social History   Socioeconomic History  . Marital status: Married    Spouse name: steven ambrose  . Number of children: 3  . Years of education: Not on file  . Highest education level: Bachelor's degree (e.g., BA, AB, BS)  Occupational History  . Not on file  Tobacco Use  . Smoking status: Never Smoker  . Smokeless tobacco: Never Used  Vaping Use  . Vaping Use: Never used  Substance and Sexual Activity  . Alcohol use: Not Currently    Alcohol/week: 0.0 standard drinks    Comment: occasional/fim  . Drug use: No  . Sexual activity: Yes    Birth control/protection: I.U.D.  Other Topics Concern  . Not on file  Social History Narrative  . Not on file   Social Determinants of Health   Financial Resource Strain: Not on file  Food Insecurity: Not on file  Transportation Needs: Not on file  Physical Activity: Not on file  Stress: Not on file  Social Connections: Not on file  Intimate Partner Violence: Not on file     PHYSICAL EXAM  Vitals:   02/28/20 1020  BP: 128/89  Pulse: (!) 125  Weight: 195 lb 8 oz (88.7 kg)  Height: 5' 2"  (1.575 m)    Body mass index is 35.76 kg/m.   General: The patient is well-developed and well-nourished and in no  acute distress    Musculoskeletal:    The neck is nontender with a good range of  motion.  Neurologic Exam  Mental status: The patient is alert and oriented x 3 at the time of the examination. The patient has apparent normal recent and remote memory, with an apparently normal attention span and concentration ability.   Speech is normal.  Cranial nerves: Extraocular movements are full.  Facial strength and sensation is normal.  Trapezius strength is normal.  The tongue is midline, and the patient has symmetric elevation of the soft palate. No obvious hearing deficits are noted.  Motor:  Muscle bulk is normal.   Tone is mildly increased in legs, bilaterally fairly equally.  Strength is  5 / 5 in all 4 extremities except 4+/5 in the feet.   Sensory: Sensory testing shows reduced vibration in the right leg and allodynia to touch in right leg.     Arms are symmetric.    Coordination: Cerebellar testing shows good finger-nose-finger but reduced heel-to-shin slightly worse on the right than left.     Gait and station: Station is normal.  She has a mild right foot drop.  The tandem gait is mildly wide.  This appears stable.  Romberg is negative..   Reflexes: Deep tendon reflexes are symmetric and increased in her legs. She has crossed adductor responses at the knees.       DIAGNOSTIC DATA (LABS, IMAGING, TESTING) - I reviewed patient records, labs, notes, testing and imaging myself where available.  Lab Results  Component Value Date   WBC 8.8 12/06/2019   HGB 14.4 12/06/2019   HCT 42.9 12/06/2019   MCV 84 12/06/2019   PLT 414 12/06/2019      Component Value Date/Time   NA 141 12/06/2019 0955   NA 141 07/28/2013 0410   K 3.8 12/06/2019 0955   K 3.7 07/28/2013 0410   CL 103 12/06/2019 0955   CL 103 07/28/2013 0410   CO2 24 12/06/2019 0955   CO2 29 07/28/2013 0410   GLUCOSE 75 12/06/2019 0955   GLUCOSE 98 09/08/2016 1855   GLUCOSE 121 (H) 07/28/2013 0410   BUN 9 12/06/2019 0955   BUN 16 07/28/2013 0410   CREATININE 0.79 12/06/2019 0955   CREATININE 0.66  07/28/2013 0410   CALCIUM 8.9 12/06/2019 0955   CALCIUM 8.6 07/28/2013 0410   PROT 6.8 12/06/2019 0955   PROT 6.7 07/25/2013 0410   ALBUMIN 4.6 12/06/2019 0955   ALBUMIN 3.3 (L) 07/25/2013 0410   AST 15 12/06/2019 0955   AST 20 07/25/2013 0410   ALT 12 12/06/2019 0955   ALT 18 07/25/2013 0410   ALKPHOS 122 (H) 12/06/2019 0955   ALKPHOS 49 07/25/2013 0410   BILITOT <0.2 12/06/2019 0955   BILITOT 0.4 07/25/2013 0410   GFRNONAA 93 12/06/2019 0955   GFRNONAA >60 07/28/2013 0410   GFRAA 108 12/06/2019 0955   GFRAA >60 07/28/2013 0410       ASSESSMENT AND PLAN  1. Multiple sclerosis ([place])   2. Attention deficit disorder, unspecified hyperactivity presence   3. Gait disturbance   4. Snoring   5. Excessive daytime sleepiness   6. High risk medication use      1.   She will continue Ocrevus.  Check labs 2.   Continue the Adderall 30 mg XR in the morning with 10 mg as needed later in the day for her reduced attention.   I will add armodafinil to see if the combination helps her  sleepiness and fatigue more.  Continue Topamax for migraines 3.    Continue baclofen for spasticity issues.  4.   She has snoring and excessive daytime sleepiness.  We will check a polysomnogram.  I would prefer an in lab study due to her coexisting neurologic disorder  5.   Return to clinic in 5-6 months or sooner if there are new or worsening neurologic symptoms.  45-minute office visit with the majority of the time spent face-to-face for history and physical, discussion/counseling and decision-making.  Additional time with record review and documentation.    Jibreel Fedewa A. Felecia Shelling, MD, PhD 3/52/4818, 59:09 AM Certified in Neurology, Clinical Neurophysiology, Sleep Medicine, Pain Medicine and Neuroimaging  [facility] [address] [place], [place] [address] [phone] olll

## 2020-02-29 ENCOUNTER — Other Ambulatory Visit: Payer: Self-pay | Admitting: Neurology

## 2020-02-29 LAB — CBC WITH DIFFERENTIAL/PLATELET
Basophils Absolute: 0 10*3/uL (ref 0.0–0.2)
Basos: 0 %
EOS (ABSOLUTE): 0.1 10*3/uL (ref 0.0–0.4)
Eos: 2 %
Hematocrit: 42.7 % (ref 34.0–46.6)
Hemoglobin: 14.4 g/dL (ref 11.1–15.9)
Immature Grans (Abs): 0 10*3/uL (ref 0.0–0.1)
Immature Granulocytes: 0 %
Lymphocytes Absolute: 1.5 10*3/uL (ref 0.7–3.1)
Lymphs: 18 %
MCH: 27.5 pg (ref 26.6–33.0)
MCHC: 33.7 g/dL (ref 31.5–35.7)
MCV: 82 fL (ref 79–97)
Monocytes Absolute: 0.7 10*3/uL (ref 0.1–0.9)
Monocytes: 9 %
Neutrophils Absolute: 5.8 10*3/uL (ref 1.4–7.0)
Neutrophils: 71 %
Platelets: 347 10*3/uL (ref 150–450)
RBC: 5.24 x10E6/uL (ref 3.77–5.28)
RDW: 13.7 % (ref 11.7–15.4)
WBC: 8.1 10*3/uL (ref 3.4–10.8)

## 2020-02-29 LAB — IGG, IGA, IGM
IgA/Immunoglobulin A, Serum: 93 mg/dL (ref 87–352)
IgG (Immunoglobin G), Serum: 798 mg/dL (ref 586–1602)
IgM (Immunoglobulin M), Srm: 103 mg/dL (ref 26–217)

## 2020-02-29 MED ORDER — METHYLPREDNISOLONE 4 MG PO TBPK: ORAL_TABLET | ORAL | 0 refills | Status: DC

## 2020-02-29 NOTE — Progress Notes (Signed)
More fatigue

## 2020-03-01 ENCOUNTER — Ambulatory Visit (INDEPENDENT_AMBULATORY_CARE_PROVIDER_SITE_OTHER): Payer: BC Managed Care – PPO | Admitting: Neurology

## 2020-03-01 ENCOUNTER — Other Ambulatory Visit: Payer: Self-pay | Admitting: Neurology

## 2020-03-01 ENCOUNTER — Other Ambulatory Visit: Payer: Self-pay

## 2020-03-01 DIAGNOSIS — G4710 Hypersomnia, unspecified: Secondary | ICD-10-CM

## 2020-03-01 DIAGNOSIS — G4719 Other hypersomnia: Secondary | ICD-10-CM

## 2020-03-01 DIAGNOSIS — R0683 Snoring: Secondary | ICD-10-CM | POA: Diagnosis not present

## 2020-03-06 NOTE — Progress Notes (Signed)
PATIENT'S NAME:  [name], [name] DOB:      May 02, 1980      MR#:    [id number]     DATE OF RECORDING: 03/01/2020 REFERRING M.D.:  Cristopher Peru, MD Study Performed:   Baseline Polysomnogram  HISTORY:  Ashland Osmer is a 40 y/o woman with relapsing remitting multiple sclerosis. She sleeps poorly at night and her kids tell her she 'sounds like an Naval architect' at night. She has snoring and excessive daytime sleepiness.      The patient endorsed the Epworth Sleepiness Scale at 16 points.    The patient's weight 195 pounds with a height of 62 (inches), resulting in a BMI of 35.7 kg/m2.   CURRENT MEDICATIONS: Albuterol, Adderall XR, Armodafinil, Baclofen, Wellbutrin, Tussionex, Cholecalciferol, Flonase, Advil, Ocrelizumab, Inderal, Zoloft, Topamax, DesyrelKenalog   PROCEDURE:  This is a multichannel digital polysomnogram utilizing the Somnostar 11.2 system.  Electrodes and sensors were applied and monitored per AASM Specifications.   EEG, EOG, Chin and Limb EMG, were sampled at 200 Hz.  ECG, Snore and Nasal Pressure, Thermal Airflow, Respiratory Effort, CPAP Flow and Pressure, Oximetry was sampled at 50 Hz. Digital video and audio were recorded.      BASELINE STUDY  Lights Out was at 21:55 and Lights On at 04:45.  Total recording time (TRT) was 410.5 minutes, with a total sleep time (TST) of 387.5 minutes.   The patient's sleep latency was 9.5 minutes.  REM latency was 153 minutes.  The sleep efficiency was 94.4 %.     SLEEP ARCHITECTURE: WASO (Wake after sleep onset) was 13 minutes.  There were 7.5 minutes in Stage N1, 309.5 minutes Stage N2, 19.5 minutes Stage N3 and 51 minutes in Stage REM.  The percentage of Stage N1 was 1.9%, Stage N2 was 79.9%, Stage N3 was 5.% and Stage R (REM sleep) was 13.2%.  The arousals were noted as: 32 were spontaneous, 0 were associated with PLMs, 1 were associated with respiratory events.  RESPIRATORY ANALYSIS:  There were a total of 3  respiratory events:  0 obstructive apneas, 0 central apneas and 1 mixed apneas with a total of 1 apneas and an apnea index (AI) of .2 /hour. There were 2 hypopneas with a hypopnea index of .3 /hour. The patient also had 0 respiratory event related arousals (RERAs).      The total APNEA/HYPOPNEA INDEX (AHI) was .5/hour and the total RESPIRATORY DISTURBANCE INDEX was  .5 /hour.  1 events occurred in REM sleep and 3 events in NREM. The REM AHI was  1.2 /hour, versus a non-REM AHI of .4. The patient spent 177 minutes of total sleep time in the supine position and 211 minutes in non-supine.. The supine AHI was 1.0 versus a non-supine AHI of 0.0.  OXYGEN SATURATION & C02:  The Wake baseline 02 saturation was 99%, with the lowest being 92%. Time spent below 89% saturation equaled 0 minutes.  PERIODIC LIMB MOVEMENTS:   The patient had a total of 0 Periodic Limb Movements.  The Periodic Limb Movement (PLM) index was 0 and the PLM Arousal index was 0/hour.  OTHER:   Audio and video analysis did not show any abnormal or unusual movements, behaviors, phonations or vocalizations.   The patient did not take bathroom breaks. Snoring was noted. EKG was in keeping with normal sinus rhythm (NSR).  Post-study, the patient indicated that sleep was better than usual but she was not refreshed.    IMPRESSION: 1. There was no significant  Obstructive Sleep Apnea (AHI = 0.5, normal) 2. Normal sleep efficiency (94%).  Reduced amount of time in stage N3 (slow wave) sleep of 4.9%.   RECOMMENDATIONS: 1. This was a normal PSG.  No specific recommendations 2. Follow-up with Dr. Epimenio Foot  I certify that I have reviewed the entire raw data recording prior to the issuance of this report in accordance with the Standards of Accreditation of the American Academy of Sleep Medicine (AASM)   Shearon Balo, PhD Diplomat, American Board of Psychiatry and Neurology,  Sleep Medicine       Demographics and Medical History            Name: Avary, Eichenberger Age: 64 BMI: 35.7 Interp Physician: Despina Arias, MD  DOB: 25-Jun-1980 Ht-IN: 62 CM: 157 Referred By: Cristopher Peru, MD  Pt. Tag:  Wt-LB: 195 KG: 88 Tested By: Domingo Cocking, RPSGT  Pt. #: [id number] Sex: Female Scored By: Domingo Cocking, RPSGT  Bed Tag: --- Race: ---    Sleep Summary    Sleep Time Statistics Minutes Hours    Time in Bed 410.5    6.8    Total Sleep Time 387.5    6.5    Total Sleep Time NREM 336.5    5.6    Total Sleep Time REM 51    0.9    Sleep Onset 9.5    0.2    Wake After Sleep Onset 13    0.2    Wake After Sleep Period 0.5    0.0    Latency Persistent Sleep 9.5    0.2    Sleep Efficiency 94.4 Percent    Lights out 21:55     Lights on 04:45    Sleep Disruption Events Count Index    Arousals 43 6.7    Awakenings 0 0    Arousals + Awakenings 43 6.7    REM Awakenings 2 0.3     Sleep Stage Statistics Wake N1 N2 N3 REM    Percent Stage to SPT 3.2  1.9  77.3  4.9  12.7  Percent   Sleep Period Time in Stage 13 7.5 309.5 19.5 51 Minutes   Latency to Stage  9.5 12 14  153 Minutes   Percent Stage to TST  1.9 79.9 5. 13.2 Percent   EKG Summary          EKG Statistics         Heart Rate, Wake 91 BPM  TST Epochs in HR Interval 0 < 29   Heart Rate, Steady Sleep Avg 87 BPM   0 30-59   PAC Events 0 Count   38 60-79   PVC Events 0 Count   729 80-99   Bradycardia 0 Count   7 100-119   Tachycardia 0 Count   1 120-139        0 140-159    NREM REM   0 > 160   Shortest R-R .6 .5       Longest R-R 0.8 0.8        Respiration Summary  Event Statistics Total  With Arousal  With Awakening    Count Index  Count Index  Count Index   Apneas, Total 1 .2  1 0.2   0 0.0    Hypopneas, Total 2 .3  0 0.0   0 0.0    Apnea + Hypopnea Index 3 0.5   1 0.2   0 0.0    Apneas, Supine 1 .3  Apneas, Non Supine 0 0     Hypopneas, Supine 2 .7     Hypopneas, Non Supine 0 0     % Sleep Apnea .1 Percent     % Sleep Hypopnea .2 Percent     Oximetry Statistics       SpO2, Mean Wake 99 Percent     SpO2, Minimum 92 Percent     SpO2, Max 98 Percent     SpO2, Mean 95 Percent            Desaturation Index, REM 1.2  Index     Desaturation Index, NREM 0.4  Index     Desaturation Index, Total 0.5  Index             SpO2 Intervals > 89% 80-89% 70-79% 60-69% 50-59% 40-49% 30-39% < 30%  387.5 Percent Sleep Time 100 0 0 0 0 0 0 0  Body Position Statistics   Back Side L Side R Side Prone    Total Sleep Time   177 210.5 78.5 132 0 Minutes   Percent Time to TST   45.7  54.3  20.3  34.1  0.0  Percent   Number of Events   3 0.0 0 0 0 Count   Number of Apneas   1 0 0 0 0 Count   Number of Hypopneas   2 0 0 0 0 Count   Apnea Index   0.3  0.0  0.0  0.0  0.0  Index   Hypopnea Index   0.7  0.0  0.0  0.0  0.0  Index   Apnea + Hypopnea Index   1.0  0.0  0.0  0.0  0.0  Index  Respiration Events    Non REM, Pre Rx Statistics Non Supine  Supine    Central Mixed Obstr  Central Mixed Obstr   Apneas 0 0 0  0 1 0 Count  Apneas, Minimum SpO2 0 0 0  0 95 0 Percent     Hypopneas 0 0 0  0 0 1 Count  Hypopneas, Minimum SpO2 0 0 0  0 0 95 Percent     Apnea + Hypopneas Index 0.0 0.0 0.0  0.0 0.4 0.4 Index    REM, Pre Rx Statistics Non Supine  Supine    Central Mixed Obstr  Central Mixed Obstr   Apneas 0 0 0  0 0 0 Count  Apneas, Minimum SpO2 0 0 0  0 0 0 Percent     Hypopneas 0 0 0  0 0 1 Count  Hypopneas, Minimum SpO2 0 0 0  0 0 95 Percent     Apnea + Hypopnea Index 0.0 0.0 0.0  0.0 0.0 2.3 Index  Leg Movement Summary    PLM Non REM (Incl. Wake) REM Total    No Arousal Arousal Wake No Arousal Arousal Wake No Arousal Arousal Wake Total   Isolated 10 7 0 4 3 0 14 10 0 24    PLMS 0 0 0 0 0 0 0 0 0 0    Total 10 7 0 4 3 0 14 10 0 24   PLM Statistics PLMS Total     Count Index Count Index    PLM 0 0 24 3.7     PLM with Arousal 0 0 10 1.5     PLM, with Wake 0 0 0 0.0     PLM, Arousal + Wake 0 0.0 10 1.5     PLM,  No Arousal 0  0.0  14 2.2     PLM, Non REM 0 0.0  17 3.0     PLM, REM 0 0.0  7 8.2     Technician Comments:  Patient arrived for a diagnostic polysomnogram. Procedure explained and all questions answered. Standard paste setup, without complications. Patient slept supine, left, and right during study. Mild snoring heard. Respiratory events noted, worse while supine. No significant PLMS noted. No restroom visit.

## 2020-03-09 ENCOUNTER — Other Ambulatory Visit: Payer: Self-pay | Admitting: Nurse Practitioner

## 2020-03-10 ENCOUNTER — Other Ambulatory Visit: Payer: Self-pay

## 2020-03-10 ENCOUNTER — Encounter: Payer: Self-pay | Admitting: Psychiatry

## 2020-03-10 ENCOUNTER — Telehealth (INDEPENDENT_AMBULATORY_CARE_PROVIDER_SITE_OTHER): Payer: BC Managed Care – PPO | Admitting: Psychiatry

## 2020-03-10 DIAGNOSIS — F5105 Insomnia due to other mental disorder: Secondary | ICD-10-CM

## 2020-03-10 DIAGNOSIS — F0632 Mood disorder due to known physiological condition with major depressive-like episode: Secondary | ICD-10-CM

## 2020-03-10 DIAGNOSIS — F411 Generalized anxiety disorder: Secondary | ICD-10-CM | POA: Diagnosis not present

## 2020-03-10 NOTE — Progress Notes (Signed)
Virtual Visit via Video Note  I connected with Portal on 03/10/20 at  9:20 AM EST by a video enabled telemedicine application and verified that I am speaking with the correct person using two identifiers.  Location Provider Location : [facility] Patient Location : Home  Participants: Patient , Provider    I discussed the limitations of evaluation and management by telemedicine and the availability of in person appointments. The patient expressed understanding and agreed to proceed.  I discussed the assessment and treatment plan with the patient. The patient was provided an opportunity to ask questions and all were answered. The patient agreed with the plan and demonstrated an understanding of the instructions.   The patient was advised to call back or seek an in-person evaluation if the symptoms worsen or if the condition fails to improve as anticipated.   [facility] MD OP Progress Note  03/10/2020 12:59 PM [name]  MRN:  [id number]  Chief Complaint:  Chief Complaint    Follow-up     HPI: [name] is a 40 year old Caucasian female who has a history of depression, insomnia, GAD, multiple sclerosis, migraine headaches was evaluated by telemedicine today.  Patient today reports she is currently recovering from COVID-19 infection.  She reports she got her COVID-19 for the second time in January.  She reports she had severe cough, fatigue.  The cough has resolved.  She continues to have postnasal drip as well as fatigue.  Patient reports she however is coping with her medical problems well.  Denies any significant sadness.  Denies any anxiety symptoms.  She reports she had a sleep study completed recently.  She reports her sleep as restless.  She reports her husband also is restless at night which itself can wake her up at night.  Patient reports she is awaiting for the sleep study report.  Patient is compliant on her medications.  Denies side  effects.  She reports she had her ocrelizumab infusion for her MS.  That has definitely helped with her fatigue to some extent however she understands she has to give it more time and it usually gets better with time.  Patient denies any suicidality, homicidality or perceptual disturbances.  She reports she continues to be in school and school is going well.  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  Visit Diagnosis:    ICD-10-CM   1. 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 GAD (generalized anxiety disorder)  F41.1   3. Insomnia due to mental disorder  F51.05     Past Psychiatric History: I have reviewed past psychiatric history from my progress note on 05/21/2018.  Past trials of Zoloft, Adderall, trazodone  Past Medical History:  Past Medical History:  Diagnosis Date  . Depression   . MS (multiple sclerosis) ([place])   . Multiple sclerosis ([place])    History reviewed. No pertinent surgical history.  Family Psychiatric History: I have reviewed family psychiatric history from my progress note on 05/21/2018  Family History:  Family History  Problem Relation Age of Onset  . Multiple myeloma Mother   . Multiple sclerosis Mother   . Obesity Sister   . Anxiety disorder Daughter   . Multiple sclerosis Maternal Aunt     Social History: I have reviewed social history from my progress note on 05/21/2018 Social History   Socioeconomic History  . Marital status: Married    Spouse name:  steven ambrose  . Number of children: 3  . Years of education: Not on file  . Highest education level: Bachelor's degree (e.g., BA, AB, BS)  Occupational History  . Not on file  Tobacco Use  . Smoking status: Never Smoker  . Smokeless tobacco: Never Used  Vaping Use  . Vaping Use: Never used  Substance and Sexual Activity  . Alcohol use: Not Currently    Alcohol/week: 0.0 standard drinks    Comment:  occasional/fim  . Drug use: No  . Sexual activity: Yes    Birth control/protection: I.U.D.  Other Topics Concern  . Not on file  Social History Narrative  . Not on file   Social Determinants of Health   Financial Resource Strain: Not on file  Food Insecurity: Not on file  Transportation Needs: Not on file  Physical Activity: Not on file  Stress: Not on file  Social Connections: Not on file    Allergies:  Allergies  Allergen Reactions  . Flexeril [Cyclobenzaprine]     Drowsy     Metabolic Disorder Labs: No results found for: HGBA1C, MPG No results found for: PROLACTIN Lab Results  Component Value Date   CHOL 206 (H) 12/06/2019   TRIG 187 (H) 12/06/2019   HDL 55 12/06/2019   CHOLHDL 2.6 12/02/2017   LDLCALC 118 (H) 12/06/2019   LDLCALC 83 12/04/2018   Lab Results  Component Value Date   TSH 1.090 12/06/2019   TSH 1.120 12/04/2018    Therapeutic Level Labs: No results found for: LITHIUM No results found for: VALPROATE No components found for:  CBMZ  Current Medications: Current Outpatient Medications  Medication Sig Dispense Refill  . albuterol (VENTOLIN HFA) 108 (90 Base) MCG/ACT inhaler TAKE 2 PUFFS BY MOUTH EVERY 6 HOURS AS NEEDED FOR WHEEZE OR SHORTNESS OF BREATH 18 each 0  . amphetamine-dextroamphetamine (ADDERALL XR) 30 MG 24 hr capsule Take 1 capsule (30 mg total) by mouth daily. 30 capsule 0  . amphetamine-dextroamphetamine (ADDERALL) 10 MG tablet Take one or two po in the aftrernoon 60 tablet 0  . Armodafinil 200 MG TABS One po qAM 30 tablet 5  . baclofen (LIORESAL) 10 MG tablet TAKE 1 TABLET BY MOUTH FOUR TIMES A DAY AS NEEDED FOR MUSCLE SPASMS 360 tablet 3  . buPROPion (WELLBUTRIN XL) 150 MG 24 hr tablet Take 1 tablet (150 mg total) by mouth daily. 90 tablet 1  . chlorpheniramine-HYDROcodone (TUSSIONEX PENNKINETIC ER) 10-8 MG/5ML SUER Take 5 mLs by mouth every 12 (twelve) hours as needed for cough. 60 mL 0  . Cholecalciferol 25 MCG (1000 UT) tablet  Take 5,000 Units by mouth daily.     . fluticasone (FLONASE) 50 MCG/ACT nasal spray SPRAY 2 SPRAYS INTO EACH NOSTRIL EVERY DAY    . ibuprofen (ADVIL) 800 MG tablet Take 1 tablet (800 mg total) by mouth 3 (three) times daily. 21 tablet 0  . methylPREDNISolone (MEDROL DOSEPAK) 4 MG TBPK tablet 6 pills po x 1 day, then 5 pills po x 1 day, then 4 pills po x 1 day, then 3 pills po x 1 day, then 2 pills po x 1 day, then 1 pill po x 1 day 21 tablet 0  . ocrelizumab 600 mg in sodium chloride 0.9 % 500 mL Inject 600 mg into the vein every 6 (six) months.    . propranolol (INDERAL) 10 MG tablet TAKE 1 TABLET (10 MG TOTAL) BY MOUTH DAILY AS NEEDED. FOR SEVERE ANXIETY ATTACK 30 tablet 1  .  sertraline (ZOLOFT) 100 MG tablet TAKE 2 TABLETS BY MOUTH EVERY DAY 180 tablet 1  . topiramate (TOPAMAX) 100 MG tablet TAKE 1 TABLET BY MOUTH EVERYDAY AT BEDTIME 90 tablet 3  . traZODone (DESYREL) 100 MG tablet Take 1 tablet (100 mg total) by mouth at bedtime. 90 tablet 1  . triamcinolone cream (KENALOG) 0.1 % Apply 1 application topically 2 (two) times daily. 30 g 0   No current facility-administered medications for this visit.     Musculoskeletal: Strength & Muscle Tone: UTA Gait & Station: UTA Patient leans: N/A  Psychiatric Specialty Exam: Review of Systems  Constitutional: Positive for fatigue.  Psychiatric/Behavioral: Positive for sleep disturbance. The patient is nervous/anxious.   All other systems reviewed and are negative.   There were no vitals taken for this visit.There is no height or weight on file to calculate BMI.  General Appearance: Casual  Eye Contact:  Fair  Speech:  Clear and Coherent  Volume:  Normal  Mood:  Anxious Coping well  Affect:  Appropriate  Thought Process:  Goal Directed and Descriptions of Associations: Intact  Orientation:  Full (Time, Place, and Person)  Thought Content: Logical   Suicidal Thoughts:  No  Homicidal Thoughts:  No  Memory:  Immediate;   Fair Recent;    Fair Remote;   Fair  Judgement:  Fair  Insight:  Fair  Psychomotor Activity:  Normal  Concentration:  Concentration: Fair and Attention Span: Fair  Recall:  [employer] of Knowledge: Fair  Language: Fair  Akathisia:  No  Handed:  Right  AIMS (if indicated): UTA  Assets:  Communication Skills Desire for Improvement Housing Social Support  ADL's:  Intact  Cognition: WNL  Sleep:  Restless   Screenings: GAD-7   Flowsheet Row Office Visit from 06/04/2019 in [place] [facility] Visit from 12/04/2018 in [facility] Visit from 06/03/2018 in [place] Visit from 04/03/2018 in [place] [facility] Visit from 12/02/2017 in [facility]  Total GAD-7 Score 6 11 5 16 2     PHQ2-9   [place] Visit from 12/06/2019 in [place] Visit from 06/04/2019 in [place] Visit from 03/15/2019 in [facility] Visit from 12/04/2018 in [place] Visit from 06/03/2018 in [place]  PHQ-2 Total Score 2 2 2 3 2   PHQ-9 Total Score 9 12 13 15 8        Assessment and Plan: JACQUELYNN FRIEND is a 41 year old Caucasian female, married, lives in [place], has a history of depression, anxiety, MS, fatigue, insomnia was evaluated by telemedicine today.  Patient is biologically predisposed given her family history, her own medical problems.  Patient with psychosocial stressors of recent COVID-19 infection being in school, medical problems.  Patient is currently recovering from COVID-19, continues to struggle with possible sleep problems, pending sleep study as well as fatigue.  Discussed plan as noted below.  Plan Depression secondary to medical condition-stable Wellbutrin XL 150 mg p.o. daily Zoloft 200 mg p.o. daily  Insomnia-restless Continue trazodone 100 mg p.o. nightly Patient had sleep study completed recently-pending. We  will make further medication changes based on sleep study results. Patient's sleep problems likely multifactorial including being on stimulant medication, the sleep environment as noted above, her own medical problems and so on.  GAD-stable Zoloft as prescribed Propranolol 10 mg p.o. daily as needed for anxiety attacks  Follow-up in clinic in 6 weeks or sooner if  needed.  I have spent atleast 20 minutes face to face by video with patient today. More than 50 % of the time was spent for preparing to see the patient ( e.g., review of test, records ), obtaining and to review and separately obtained history , ordering medications and test ,psychoeducation and supportive psychotherapy and care coordination,as well as documenting clinical information in electronic health record. This note was generated in part or whole with voice recognition software. Voice recognition is usually quite accurate but there are transcription errors that can and very often do occur. I apologize for any typographical errors that were not detected and corrected.     Kristen Alert, MD 03/10/2020, 12:59 PM

## 2020-03-25 ENCOUNTER — Other Ambulatory Visit: Payer: Self-pay

## 2020-03-27 MED ORDER — AMPHETAMINE-DEXTROAMPHET ER 30 MG PO CP24: 30.0000 mg | ORAL_CAPSULE | Freq: Every day | ORAL | 0 refills | Status: DC

## 2020-04-14 ENCOUNTER — Telehealth (INDEPENDENT_AMBULATORY_CARE_PROVIDER_SITE_OTHER): Payer: BC Managed Care – PPO | Admitting: Psychiatry

## 2020-04-14 ENCOUNTER — Other Ambulatory Visit: Payer: Self-pay

## 2020-04-14 DIAGNOSIS — F0632 Mood disorder due to known physiological condition with major depressive-like episode: Secondary | ICD-10-CM

## 2020-04-14 NOTE — Progress Notes (Signed)
Patient connected at the time of appointment and stated she is at the hospital, her son had an emergency surgery. She wants to reschedule.

## 2020-05-02 ENCOUNTER — Other Ambulatory Visit: Payer: Self-pay

## 2020-05-02 MED ORDER — AMPHETAMINE-DEXTROAMPHET ER 30 MG PO CP24: 30.0000 mg | ORAL_CAPSULE | Freq: Every day | ORAL | 0 refills | Status: DC

## 2020-05-19 ENCOUNTER — Other Ambulatory Visit: Payer: Self-pay | Admitting: Psychiatry

## 2020-05-19 DIAGNOSIS — F0632 Mood disorder due to known physiological condition with major depressive-like episode: Secondary | ICD-10-CM

## 2020-06-02 ENCOUNTER — Other Ambulatory Visit: Payer: Self-pay

## 2020-06-02 MED ORDER — AMPHETAMINE-DEXTROAMPHET ER 30 MG PO CP24: 30.0000 mg | ORAL_CAPSULE | Freq: Every day | ORAL | 0 refills | Status: DC

## 2020-06-07 ENCOUNTER — Encounter: Payer: Self-pay | Admitting: Nurse Practitioner

## 2020-06-07 ENCOUNTER — Ambulatory Visit: Payer: BC Managed Care – PPO | Admitting: Nurse Practitioner

## 2020-06-07 ENCOUNTER — Other Ambulatory Visit: Payer: Self-pay

## 2020-06-07 VITALS — BP 100/70 | HR 86 | Temp 98.6°F | Wt 182.4 lb

## 2020-06-07 DIAGNOSIS — F0632 Mood disorder due to known physiological condition with major depressive-like episode: Secondary | ICD-10-CM | POA: Diagnosis not present

## 2020-06-07 DIAGNOSIS — F411 Generalized anxiety disorder: Secondary | ICD-10-CM | POA: Diagnosis not present

## 2020-06-07 DIAGNOSIS — F5105 Insomnia due to other mental disorder: Secondary | ICD-10-CM

## 2020-06-07 DIAGNOSIS — Z6833 Body mass index (BMI) 33.0-33.9, adult: Secondary | ICD-10-CM

## 2020-06-07 DIAGNOSIS — G43009 Migraine without aura, not intractable, without status migrainosus: Secondary | ICD-10-CM

## 2020-06-07 DIAGNOSIS — E6609 Other obesity due to excess calories: Secondary | ICD-10-CM

## 2020-06-07 DIAGNOSIS — E7800 Pure hypercholesterolemia, unspecified: Secondary | ICD-10-CM | POA: Insufficient documentation

## 2020-06-07 DIAGNOSIS — F988 Other specified behavioral and emotional disorders with onset usually occurring in childhood and adolescence: Secondary | ICD-10-CM

## 2020-06-07 DIAGNOSIS — G35 Multiple sclerosis: Secondary | ICD-10-CM

## 2020-06-07 NOTE — Assessment & Plan Note (Signed)
Chronic, stable.  Denies SI/HI.  Will continue collaboration with psychiatry and current medication regimen as prescribed by them.

## 2020-06-07 NOTE — Assessment & Plan Note (Signed)
Chronic, ongoing, followed by psychiatry, continue current medication regimen and collaboration with them.

## 2020-06-07 NOTE — Assessment & Plan Note (Signed)
Chronic, ongoing secondary to MS.  Continue Trazodone and collaboration with psychiatry and therapy.

## 2020-06-07 NOTE — Assessment & Plan Note (Signed)
Chronic, ongoing and stable with Topamax.  Continue collaboration with neurology and recent notes reviewed.

## 2020-06-07 NOTE — Assessment & Plan Note (Addendum)
Chronic, ongoing -- with infusions.  Continue to collaborate with neurology and current treatment. 

## 2020-06-07 NOTE — Patient Instructions (Signed)

## 2020-06-07 NOTE — Progress Notes (Signed)
BP 100/70   Pulse 86   Temp 98.6 F (37 C) (Oral)   Wt 182 lb 6.4 oz (82.7 kg)   SpO2 98%   BMI 33.36 kg/m    Subjective:    Patient ID: [name], female    DOB: [birth year], 40 y.o.   MRN: [id number]  HPI: [name] is a 40 y.o. female  Chief Complaint  Patient presents with  . MS  . Mood  . Migraine  . Follow-up    Patient states everything is going "ok" and states she still has headaches and it is more than she should she feels and she thinks it may be weather related. Patient states she is very fatigue and her legs bother her as well. Patient states she feels it may also be stressed related.   MIGRAINES & MS Continues on Topamax and is followed by neurologyfor this and her MS -- last saw 02/28/20.  Was sent for sleep testing which was normal. Continues on on infusions for MS -- Ocrevus -- last on 03/08/20.  She feels changes in weather tend to bring migraines on more. Headache status at time of visit:asymptomatic Treatments attempted:Treatments attempted:none, triptans and topamax Aura:yes Nausea:yes Vomiting:no Photophobia:no Phonophobia:no Effect on social functioning:no Numbers of missed days of school/work each month:0 Confusion:no Gait disturbance/ataxia:no Behavioral changes:no Fevers:no  DEPRESSION Followed by psychiatry at [facility], Dr. Elna Breslow. Last seen3/11/22. Patient also is followed by therapist. Continues on Sertraline, Wellbutrin, Adderall, and Trazodone + Propranolol as needed. Mood status:stable Satisfied with current treatment?:yes Symptom severity:mild Duration of current treatment :chronic Side effects:no Medication compliance:good compliance Psychotherapy/counseling:yescurrent Depressed mood:yes Anxious mood:no Anhedonia:no Significant weight loss or gain:no Insomnia:nohard to fall asleep Fatigue:no Feelings of worthlessness or guilt:no Impaired  concentration/indecisiveness:no Suicidal ideations:no Hopelessness:no Crying spells:no Depression screen [facility] 2/9 06/07/2020 12/06/2019 06/04/2019 03/15/2019 12/04/2018  Decreased Interest 1 1 1 1 1   Down, Depressed, Hopeless 0 1 1 1 2   PHQ - 2 Score 1 2 2 2 3   Altered sleeping 0 2 2 2 1   Tired, decreased energy 3 2 3 3 3   Change in appetite 1 2 3 3 3   Feeling bad or failure about yourself  0 0 0 2 3  Trouble concentrating 1 1 2 1 2   Moving slowly or fidgety/restless 0 0 0 0 0  Suicidal thoughts 0 0 0 0 0  PHQ-9 Score 6 9 12 13 15   Difficult doing work/chores Not difficult at all Somewhat difficult Somewhat difficult Somewhat difficult Somewhat difficult  Some recent data might be hidden    Relevant past medical, surgical, family and social history reviewed and updated as indicated. Interim medical history since our last visit reviewed. Allergies and medications reviewed and updated.  Review of Systems  Constitutional: Negative for activity change, appetite change, diaphoresis, fatigue and fever.  Respiratory: Negative for cough, chest tightness and shortness of breath.   Cardiovascular: Negative for chest pain, palpitations and leg swelling.  Gastrointestinal: Negative.   Neurological: Negative.   Psychiatric/Behavioral: Negative.     Per HPI unless specifically indicated above     Objective:    BP 100/70   Pulse 86   Temp 98.6 F (37 C) (Oral)   Wt 182 lb 6.4 oz (82.7 kg)   SpO2 98%   BMI 33.36 kg/m   Wt Readings from Last 3 Encounters:  06/07/20 182 lb 6.4 oz (82.7 kg)  02/28/20 195 lb 8 oz (88.7 kg)  12/14/19 194 lb (88 kg)    Physical  Exam Vitals and nursing note reviewed.  Constitutional:      General: She is awake. She is not in acute distress.    Appearance: She is well-developed. She is obese. She is not ill-appearing.  HENT:     Head: Normocephalic.     Right Ear: Hearing normal.     Left Ear: Hearing normal.     Nose: Nose normal.  Eyes:      General: Lids are normal.        Right eye: No discharge.        Left eye: No discharge.     Conjunctiva/sclera: Conjunctivae normal.     Pupils: Pupils are equal, round, and reactive to light.  Cardiovascular:     Rate and Rhythm: Normal rate and regular rhythm.     Heart sounds: Normal heart sounds. No murmur heard. No gallop.   Pulmonary:     Effort: Pulmonary effort is normal. No accessory muscle usage or respiratory distress.     Breath sounds: Normal breath sounds.  Abdominal:     General: Bowel sounds are normal.     Palpations: Abdomen is soft.  Musculoskeletal:     Cervical back: Normal range of motion and neck supple.     Right lower leg: No edema.     Left lower leg: No edema.  Skin:    General: Skin is warm and dry.  Neurological:     Mental Status: She is alert and oriented to person, place, and time.  Psychiatric:        Attention and Perception: Attention normal.        Mood and Affect: Mood normal.        Behavior: Behavior normal. Behavior is cooperative.        Thought Content: Thought content normal.        Judgment: Judgment normal.    Results for orders placed or performed in visit on 02/28/20  IgG, IgA, IgM  Result Value Ref Range   IgG (Immunoglobin G), Serum 798 586 - 1,602 mg/dL   IgA/Immunoglobulin A, Serum 93 87 - 352 mg/dL   IgM (Immunoglobulin M), Srm 103 26 - 217 mg/dL  CBC with Differential/Platelet  Result Value Ref Range   WBC 8.1 3.4 - 10.8 x10E3/uL   RBC 5.24 3.77 - 5.28 x10E6/uL   Hemoglobin 14.4 11.1 - 15.9 g/dL   Hematocrit 14.9 70.2 - 46.6 %   MCV 82 79 - 97 fL   MCH 27.5 26.6 - 33.0 pg   MCHC 33.7 31.5 - 35.7 g/dL   RDW 63.7 85.8 - 85.0 %   Platelets 347 150 - 450 x10E3/uL   Neutrophils 71 Not Estab. %   Lymphs 18 Not Estab. %   Monocytes 9 Not Estab. %   Eos 2 Not Estab. %   Basos 0 Not Estab. %   Neutrophils Absolute 5.8 1.4 - 7.0 x10E3/uL   Lymphocytes Absolute 1.5 0.7 - 3.1 x10E3/uL   Monocytes Absolute 0.7 0.1 - 0.9  x10E3/uL   EOS (ABSOLUTE) 0.1 0.0 - 0.4 x10E3/uL   Basophils Absolute 0.0 0.0 - 0.2 x10E3/uL   Immature Granulocytes 0 Not Estab. %   Immature Grans (Abs) 0.0 0.0 - 0.1 x10E3/uL      Assessment & Plan:   Problem List Items Addressed This Visit      Cardiovascular and Mediastinum   Migraine without aura and without status migrainosus, not intractable    Chronic, ongoing and stable with Topamax.  Continue collaboration  with neurology and recent notes reviewed.        Nervous and Auditory   Multiple sclerosis (HCC) - Primary    Chronic, ongoing -- with infusions.  Continue to collaborate with neurology and current treatment.      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  Denies SI/HI.  Followed by psychiatry, recent note reviewed.  Continue current medication regimen as prescribed by them.          Other   Attention deficit disorder    Chronic, ongoing, followed by psychiatry, continue current medication regimen and collaboration with them.      Insomnia due to mental disorder    Chronic, ongoing secondary to MS.  Continue Trazodone and collaboration with psychiatry and therapy.        GAD (generalized anxiety disorder)    Chronic, stable.  Denies SI/HI.  Will continue collaboration with psychiatry and current medication regimen as prescribed by them.      Obesity    Praised for loss of 13 pounds.  Recommended eating smaller high protein, low fat meals more frequently and exercising 30 mins a day 5 times a week with a goal of 10-15lb weight loss in the next 3 months. Patient voiced their understanding and motivation to adhere to these recommendations.           Follow up plan: Return in about 26 weeks (around 12/06/2020) for Annual physical.

## 2020-06-07 NOTE — Assessment & Plan Note (Signed)
Chronic, stable.  Denies SI/HI.  Followed by psychiatry, recent note reviewed.  Continue current medication regimen as prescribed by them.

## 2020-06-07 NOTE — Assessment & Plan Note (Signed)
Praised for loss of 13 pounds.  Recommended eating smaller high protein, low fat meals more frequently and exercising 30 mins a day 5 times a week with a goal of 10-15lb weight loss in the next 3 months. Patient voiced their understanding and motivation to adhere to these recommendations.

## 2020-06-14 ENCOUNTER — Encounter: Payer: Self-pay | Admitting: Neurology

## 2020-06-14 ENCOUNTER — Ambulatory Visit: Payer: BC Managed Care – PPO | Admitting: Neurology

## 2020-06-14 VITALS — BP 102/70 | HR 98 | Ht 62.0 in | Wt 183.0 lb

## 2020-06-14 DIAGNOSIS — G35 Multiple sclerosis: Secondary | ICD-10-CM | POA: Diagnosis not present

## 2020-06-14 DIAGNOSIS — R269 Unspecified abnormalities of gait and mobility: Secondary | ICD-10-CM

## 2020-06-14 DIAGNOSIS — F988 Other specified behavioral and emotional disorders with onset usually occurring in childhood and adolescence: Secondary | ICD-10-CM

## 2020-06-14 DIAGNOSIS — F418 Other specified anxiety disorders: Secondary | ICD-10-CM

## 2020-06-14 DIAGNOSIS — Z79899 Other long term (current) drug therapy: Secondary | ICD-10-CM

## 2020-06-14 MED ORDER — CYCLOBENZAPRINE HCL 5 MG PO TABS
5.0000 mg | ORAL_TABLET | Freq: Three times a day (TID) | ORAL | 1 refills | Status: DC | PRN
Start: 2020-06-14 — End: 2020-12-06

## 2020-06-14 NOTE — Progress Notes (Signed)
[facility]  PATIENT: [name] DOB: 05-08-1980  REFERRING DOCTOR OR PCP:  Dr. Jennings Books  [facility]) SOURCE: Patient, a couple MRI reports and images on PACS. Lab reports, notes from Dr. Brigitte Pulse  _________________________________   HISTORICAL  CHIEF COMPLAINT:  Chief Complaint  Patient presents with  . Follow-up    RM 13. Last seen 02/28/2020. Having speech/processing issues, feels sluggish. On Ocrevus 649m q 6 months. Last infusion 03/08/20, next: 09/20/20. Psychiatrist told her not to start armodafinil. Did not want her on both armodafinil and adderall along with her wellbutrin.    HISTORY OF PRESENT ILLNESS:  [name] a 40y.o. woman with relapsing remitting multiple sclerosis.     Update 06/14/2020: She had her last OCrevus in February and next one will be 09/20/2020.   No exacerbations but she is noting more difficulty with verbal processing.   She is having some word finding errors and word substitution.    Last week, she was singing and had a section with 'gobbledygook' coming out.    She plays piano and guitar and has some errors when she reads music.  She works as a mCivil engineer, contracting   She will be starting graduate school in the fall (clinical mental health) and is worried about these new issues.  Due to her cognitive issues and snoring, I was concerned about possibility of OSA but PSG was normal.    She did have a relative low amount of stage N3  sleep (only 4.9%).   She is on baclofen 30 mg and trazodone 100 mg at night.     She is walking about the same with some reduced balance.   She has some some right leg weakness and spasticity.   She stumbles some.  She has a right foot drop. She also takes baclofen (10 mg in am and 30 mg at night)  for spasticity with benefit.       Mood is a little worse.    She is on Zoloft 200 mg and Wellbutrin 150 mg/d. .Marland Kitchen She sees [employer]   She notes mild cognitive issues with  reduced focus/attention, STM and processing.   She has noted more word finding diffiuculty.  She tries to stay cognitively active and instrumental in [place].  Adderall XR in the morning with additional IR many but not all afternoons.  Migraine headaches have been well on topiramate.   She had Covid last year and again last month. The second time was mild while the first time wiped her out x weeks.        EPWORTH SLEEPINESS SCALE  On a scale of 0 - 3 what is the chance of dozing:  Sitting and Reading:`   3 Watching TV:    3 Sitting inactive in a public place: 0 Passenger in car for one hour: 3 Lying down to rest in the afternoon: 3 Sitting and talking to someone: 0 Sitting quietly after lunch:  3 In a car, stopped in traffic:  1  Total (out of 24):   16/24  Moderate excessive daytime sleepiness.  MS history:   In late June or early July 2011, she had the onset of numbness that went up to her belly button. She presented to the emergency room. An MRI of the brain showed 1 periventricular focus with maybe a second subtle periventricular focus. An MRI of the cervical spine showed an enhancing lesion adjacent to C3-C4. The diagnosis of possible MS was made  but the evidence was not enough to begin a disease modifying therapy. In 2015, she had the onset of similar symptoms and also had decreased ability to use the left hand. An MRI of the brain at that time showed several new lesions not present in 2011, including a focus in the right middle cerebellar peduncle additionally, she had a lumbar puncture consistent with MS. Because of her symptoms, MRI changes and lumbar puncture results, she was diagnosed with clinical definite relapsing remitting MS and was started on Tecfidera. She has continued on Tecfidera. Over the past few months she has noted more difficulty with focus and concentration.     She also has had some change in vision more recently. Because of these newer symptoms, and MRI was repeated  04/27/2015. The MRI of the cervical spine showed no new lesions. though the MRI of the brain showed 2  lesions not present in 2015, one in the left juxtacortical white matter and one in the left deep white matter.  She had a small exacerbation in August 2017 and received IV SOlu-Medrol.   MRI cervical spine 2017 an MRI of the brain show two spinal plaques, a definite rightt middle cerebellar peduncle plaque and a possible right pontine plaque and several foci in the hemispheres  MRI 01/2016 showed no new lesions.  She is JCV Ab positive.  She had an exacerbation in 2017 and early 2018 switch to ocrelizumab with her first doses in May 2018 (was on Gilenya).   She had another small exacerbation in July (right leg stiff/weak) and she received 3 days of IV site.  She went from using a cane to a walker for balance.  She tolerates the ocrelizumab well.     FH:  Her mom has MS and her MS stabilized after a bone marrow transplant for cancer.  MRI images MRI of the brain 09/24/2017 showed T2 hyperintense foci in the right middle cerebellar peduncle and hemispheres.  There were no changes compared to 09/08/2017.  MRI of the cervical spine 09/26/2017 showed foci within the spinal cord adjacent to C4 and C6-C7.  They were both present on an MRI from 04/04/2016.  There is mild spinal stenosis at C5-C6 due to disc protrusion.   REVIEW OF SYSTEMS: Constitutional: No fevers, chills, sweats, or change in appetite.  She has fatigue and insomnia. Eyes: No visual changes, double vision, eye pain Ear, nose and throat: No hearing loss, ear pain, nasal congestion, sore throat Cardiovascular: No chest pain, palpitations Respiratory: No shortness of breath at rest or with exertion.   No wheezes GastrointestinaI: No nausea, vomiting, diarrhea, abdominal pain, fecal incontinence Genitourinary:   She notes frequency, some hesitancy at times and nocturia. Musculoskeletal: No neck pain, back pain Integumentary: No rash, pruritus,  skin lesions Neurological: as above Psychiatric: as above Endocrine: No palpitations, diaphoresis, change in appetite, change in weigh or increased thirst Hematologic/Lymphatic: No anemia, purpura, petechiae. Allergic/Immunologic: No itchy/runny eyes, nasal congestion, recent allergic reactions, rashes  ALLERGIES: Allergies  Allergen Reactions  . Flexeril [Cyclobenzaprine] Other (See Comments)    Drowsy when taking during the day     HOME MEDICATIONS:  Current Outpatient Medications:  .  albuterol (VENTOLIN HFA) 108 (90 Base) MCG/ACT inhaler, TAKE 2 PUFFS BY MOUTH EVERY 6 HOURS AS NEEDED FOR WHEEZE OR SHORTNESS OF BREATH, Disp: 18 each, Rfl: 0 .  amphetamine-dextroamphetamine (ADDERALL XR) 30 MG 24 hr capsule, Take 1 capsule (30 mg total) by mouth daily., Disp: 30 capsule, Rfl: 0 .  amphetamine-dextroamphetamine (ADDERALL) 10 MG tablet, Take one or two po in the aftrernoon, Disp: 60 tablet, Rfl: 0 .  baclofen (LIORESAL) 10 MG tablet, TAKE 1 TABLET BY MOUTH FOUR TIMES A DAY AS NEEDED FOR MUSCLE SPASMS, Disp: 360 tablet, Rfl: 3 .  buPROPion (WELLBUTRIN XL) 150 MG 24 hr tablet, TAKE 1 TABLET BY MOUTH EVERY DAY, Disp: 90 tablet, Rfl: 1 .  Cholecalciferol 25 MCG (1000 UT) tablet, Take 5,000 Units by mouth daily. , Disp: , Rfl:  .  cyclobenzaprine (FLEXERIL) 5 MG tablet, Take 1 tablet (5 mg total) by mouth every 8 (eight) hours as needed for muscle spasms., Disp: 30 tablet, Rfl: 1 .  ibuprofen (ADVIL) 800 MG tablet, Take 1 tablet (800 mg total) by mouth 3 (three) times daily., Disp: 21 tablet, Rfl: 0 .  ocrelizumab 600 mg in sodium chloride 0.9 % 500 mL, Inject 600 mg into the vein every 6 (six) months., Disp: , Rfl:  .  propranolol (INDERAL) 10 MG tablet, TAKE 1 TABLET (10 MG TOTAL) BY MOUTH DAILY AS NEEDED. FOR SEVERE ANXIETY ATTACK, Disp: 30 tablet, Rfl: 1 .  sertraline (ZOLOFT) 100 MG tablet, TAKE 2 TABLETS BY MOUTH EVERY DAY, Disp: 180 tablet, Rfl: 1 .  topiramate (TOPAMAX) 100 MG  tablet, TAKE 1 TABLET BY MOUTH EVERYDAY AT BEDTIME, Disp: 90 tablet, Rfl: 3 .  traZODone (DESYREL) 100 MG tablet, Take 1 tablet (100 mg total) by mouth at bedtime., Disp: 90 tablet, Rfl: 1 .  triamcinolone cream (KENALOG) 0.1 %, Apply 1 application topically 2 (two) times daily., Disp: 30 g, Rfl: 0 .  Armodafinil 200 MG TABS, One po qAM (Patient not taking: No sig reported), Disp: 30 tablet, Rfl: 5  PAST MEDICAL HISTORY: Past Medical History:  Diagnosis Date  . Depression   . Migraine   . MS (multiple sclerosis) ([place])   . Multiple sclerosis ([place])   . Spinal stenosis     PAST SURGICAL HISTORY: Past Surgical History:  Procedure Laterality Date  . NO PAST SURGERIES      FAMILY HISTORY: Family History  Problem Relation Age of Onset  . Multiple myeloma Mother   . Multiple sclerosis Mother   . Obesity Sister   . Anxiety disorder Daughter   . Multiple sclerosis Maternal Aunt     SOCIAL HISTORY:  Social History   Socioeconomic History  . Marital status: Married    Spouse name: steven ambrose  . Number of children: 3  . Years of education: Not on file  . Highest education level: Bachelor's degree (e.g., BA, AB, BS)  Occupational History  . Not on file  Tobacco Use  . Smoking status: Never Smoker  . Smokeless tobacco: Never Used  Vaping Use  . Vaping Use: Never used  Substance and Sexual Activity  . Alcohol use: Not Currently    Alcohol/week: 0.0 standard drinks    Comment: occasional/fim  . Drug use: No  . Sexual activity: Yes    Birth control/protection: I.U.D.  Other Topics Concern  . Not on file  Social History Narrative  . Not on file   Social Determinants of Health   Financial Resource Strain: Not on file  Food Insecurity: Not on file  Transportation Needs: Not on file  Physical Activity: Not on file  Stress: Not on file  Social Connections: Not on file  Intimate Partner Violence: Not on file     PHYSICAL EXAM  Vitals:   06/14/20 1421  BP:  102/70  Pulse: 98  SpO2: 98%  Weight: 183 lb (83 kg)  Height: 5' 2"  (1.575 m)    Body mass index is 33.47 kg/m.   General: The patient is well-developed and well-nourished and in no acute distress    Musculoskeletal:    The neck is nontender with a good range of motion.  Neurologic Exam  Mental status: The patient is alert and oriented x 3 at the time of the examination. The patient has apparent normal recent and remote memory, with an apparently normal attention span and concentration ability.   Speech is normal.  Cranial nerves: Extraocular movements are full.  Facial strength and sensation is normal.  Trapezius strength is normal.  The tongue is midline, and the patient has symmetric elevation of the soft palate. No obvious hearing deficits are noted.  Motor:  Muscle bulk is normal.   Tone is mildly increased in legs, bilaterally fairly equally.  Strength is  5 / 5 in all 4 extremities except 4+/5 in the feet.   Sensory: Sensory testing shows reduced vibration in the right leg and allodynia to touch in right leg.     Arms are symmetric.    Coordination: Cerebellar testing shows good finger-nose-finger but reduced heel-to-shin slightly worse on the right than left.     Gait and station: Station is normal.  She has a mild right foot drop.  The tandem gait is mildly wide.  This appears stable.  Romberg is negative..   Reflexes: Deep tendon reflexes are symmetric and increased in her legs. She has crossed adductor responses at the knees.       DIAGNOSTIC DATA (LABS, IMAGING, TESTING) - I reviewed patient records, labs, notes, testing and imaging myself where available.  Lab Results  Component Value Date   WBC 8.1 02/28/2020   HGB 14.4 02/28/2020   HCT 42.7 02/28/2020   MCV 82 02/28/2020   PLT 347 02/28/2020      Component Value Date/Time   NA 141 12/06/2019 0955   NA 141 07/28/2013 0410   K 3.8 12/06/2019 0955   K 3.7 07/28/2013 0410   CL 103 12/06/2019 0955   CL  103 07/28/2013 0410   CO2 24 12/06/2019 0955   CO2 29 07/28/2013 0410   GLUCOSE 75 12/06/2019 0955   GLUCOSE 98 09/08/2016 1855   GLUCOSE 121 (H) 07/28/2013 0410   BUN 9 12/06/2019 0955   BUN 16 07/28/2013 0410   CREATININE 0.79 12/06/2019 0955   CREATININE 0.66 07/28/2013 0410   CALCIUM 8.9 12/06/2019 0955   CALCIUM 8.6 07/28/2013 0410   PROT 6.8 12/06/2019 0955   PROT 6.7 07/25/2013 0410   ALBUMIN 4.6 12/06/2019 0955   ALBUMIN 3.3 (L) 07/25/2013 0410   AST 15 12/06/2019 0955   AST 20 07/25/2013 0410   ALT 12 12/06/2019 0955   ALT 18 07/25/2013 0410   ALKPHOS 122 (H) 12/06/2019 0955   ALKPHOS 49 07/25/2013 0410   BILITOT <0.2 12/06/2019 0955   BILITOT 0.4 07/25/2013 0410   GFRNONAA 93 12/06/2019 0955   GFRNONAA >60 07/28/2013 0410   GFRAA 108 12/06/2019 0955   GFRAA >60 07/28/2013 0410        ASSESSMENT AND PLAN  Multiple sclerosis (HCC) - Plan: MR BRAIN W WO CONTRAST  Attention deficit disorder, unspecified hyperactivity presence  Gait disturbance  High risk medication use  Depression with anxiety   1.   Continue Ocrevus.  Labs were fine at the last visit.  We will check an MRI of the brain  to determine if she is having any subclinical progression. 2.   PSG did not show any OSA.  She had a good sleep efficiency but there was very little stage N3 sleep.  I will have her switch from trazodone to low-dose of Flexeril at night to see if that helps her feel more refreshed with her sleep 3.   Stay active and exercise as tolerated. 4.   Continue Adderall XR 30 mg in the morning and 10 mg later in the day as needed 5.   Return in 6 months or sooner if there are new or worsening neurologic symptoms.  Bayle Calvo A. Felecia Shelling, MD, [facility] 04/27/4008, 2:72 PM Certified in Neurology, Clinical Neurophysiology, Sleep Medicine and Neuroimaging  [facility] [address] [place], [place] [address] [phone] olll

## 2020-06-15 ENCOUNTER — Telehealth: Payer: Self-pay | Admitting: Neurology

## 2020-06-15 NOTE — Telephone Encounter (Signed)
error 

## 2020-06-15 NOTE — Telephone Encounter (Addendum)
07/04/20 Called pt to see if she'd like to schedule mri, was unable to lvm (vm full)  LVM asking pt to cb to schedule MRI.

## 2020-06-22 ENCOUNTER — Other Ambulatory Visit: Payer: Self-pay | Admitting: Psychiatry

## 2020-06-22 DIAGNOSIS — F0632 Mood disorder due to known physiological condition with major depressive-like episode: Secondary | ICD-10-CM

## 2020-06-26 ENCOUNTER — Other Ambulatory Visit: Payer: Self-pay | Admitting: Nurse Practitioner

## 2020-07-04 ENCOUNTER — Other Ambulatory Visit: Payer: Self-pay | Admitting: Neurology

## 2020-07-04 MED ORDER — AMPHETAMINE-DEXTROAMPHET ER 30 MG PO CP24: 30.0000 mg | ORAL_CAPSULE | Freq: Every day | ORAL | 0 refills | Status: DC

## 2020-08-04 ENCOUNTER — Other Ambulatory Visit: Payer: Self-pay

## 2020-08-08 MED ORDER — AMPHETAMINE-DEXTROAMPHET ER 30 MG PO CP24: 30.0000 mg | ORAL_CAPSULE | Freq: Every day | ORAL | 0 refills | Status: DC

## 2020-08-09 ENCOUNTER — Other Ambulatory Visit: Payer: Self-pay | Admitting: Psychiatry

## 2020-08-09 DIAGNOSIS — F5105 Insomnia due to other mental disorder: Secondary | ICD-10-CM

## 2020-08-10 NOTE — Telephone Encounter (Signed)
Ordered Trazodone only for a month based on the request from the pharmacy. She has not been seen since Feb. Please contact her to have follow up with Dr. Elna Breslow.

## 2020-08-28 ENCOUNTER — Ambulatory Visit: Payer: BC Managed Care – PPO | Admitting: Neurology

## 2020-08-29 ENCOUNTER — Telehealth: Payer: Self-pay | Admitting: Neurology

## 2020-08-29 NOTE — Telephone Encounter (Signed)
45 mins MRI brain w/wo contrast Dr. Gershon Mussel Berkley Harvey: [phone] exp. 09/26/20 scheduled at [facility] 09/06/20

## 2020-09-06 ENCOUNTER — Other Ambulatory Visit: Payer: Self-pay

## 2020-09-06 ENCOUNTER — Ambulatory Visit: Payer: BC Managed Care – PPO

## 2020-09-06 DIAGNOSIS — G35 Multiple sclerosis: Secondary | ICD-10-CM | POA: Diagnosis not present

## 2020-09-06 MED ORDER — GADOBENATE DIMEGLUMINE 529 MG/ML IV SOLN
17.0000 mL | Freq: Once | INTRAVENOUS | Status: AC | PRN
  Administered 2020-09-06: 17 mL via INTRAVENOUS

## 2020-09-07 ENCOUNTER — Other Ambulatory Visit: Payer: Self-pay | Admitting: Neurology

## 2020-09-08 MED ORDER — AMPHETAMINE-DEXTROAMPHET ER 30 MG PO CP24: 30.0000 mg | ORAL_CAPSULE | Freq: Every day | ORAL | 0 refills | Status: DC

## 2020-09-20 ENCOUNTER — Other Ambulatory Visit: Payer: Self-pay | Admitting: Nurse Practitioner

## 2020-09-20 NOTE — Telephone Encounter (Signed)
Requested Prescriptions  Pending Prescriptions Disp Refills  . sertraline (ZOLOFT) 100 MG tablet [Pharmacy Med Name: SERTRALINE HCL 100 MG TABLET] 90 tablet 1    Sig: TAKE 2 TABLETS BY MOUTH EVERY DAY     Psychiatry:  Antidepressants - SSRI Passed - 09/20/2020  2:57 AM      Passed - Completed PHQ-2 or PHQ-9 in the last 360 days      Passed - Valid encounter within last 6 months    Recent Outpatient Visits          3 months ago Multiple sclerosis (HCC)   [facility] [place], Delevan T, NP   7 months ago Sinus pressure   [facility] [place], [place] T, NP   9 months ago Acute pain of left shoulder   [facility] [place], [name], NP   9 months ago Encounter for annual physical exam   [facility] [place], Corrie Dandy T, NP   1 year ago Rash   [facility] [place], [name], NP      Future Appointments            In 2 months [place], [name], NP [facility], [facility]

## 2020-10-11 ENCOUNTER — Other Ambulatory Visit: Payer: Self-pay | Admitting: Neurology

## 2020-10-12 ENCOUNTER — Other Ambulatory Visit: Payer: Self-pay | Admitting: Neurology

## 2020-10-12 MED ORDER — AMPHETAMINE-DEXTROAMPHET ER 30 MG PO CP24: 30.0000 mg | ORAL_CAPSULE | Freq: Every day | ORAL | 0 refills | Status: DC

## 2020-11-10 ENCOUNTER — Other Ambulatory Visit: Payer: Self-pay

## 2020-11-12 MED ORDER — AMPHETAMINE-DEXTROAMPHET ER 30 MG PO CP24: 30.0000 mg | ORAL_CAPSULE | Freq: Every day | ORAL | 0 refills | Status: DC

## 2020-11-21 ENCOUNTER — Other Ambulatory Visit: Payer: Self-pay

## 2020-11-21 MED ORDER — AMPHETAMINE-DEXTROAMPHETAMINE 10 MG PO TABS: ORAL_TABLET | ORAL | 0 refills | Status: DC

## 2020-11-21 MED ORDER — FLUTICASONE PROPIONATE 50 MCG/ACT NA SUSP: 2.0000 | Freq: Every day | NASAL | 2 refills | Status: DC

## 2020-11-21 NOTE — Telephone Encounter (Signed)
Patient is up to date on her appointments. Patient is due for a refill on adderall 10mg . [place] Controlled Substance Registry checked and is appropriate.

## 2020-11-22 ENCOUNTER — Telehealth: Payer: Self-pay | Admitting: *Deleted

## 2020-11-22 NOTE — Telephone Encounter (Signed)
Initiated PA adderall 10mg  on CMM. Key - Rx #: [id number]. In process of completing.

## 2020-11-23 NOTE — Telephone Encounter (Signed)
Submitted PA. Waiting on determination. °

## 2020-11-23 NOTE — Telephone Encounter (Signed)
PA approved 11/23/20-11/24/23. PA# [employer] [phone] non-grandfathered [id number]

## 2020-12-06 ENCOUNTER — Ambulatory Visit (INDEPENDENT_AMBULATORY_CARE_PROVIDER_SITE_OTHER): Payer: BC Managed Care – PPO | Admitting: Nurse Practitioner

## 2020-12-06 ENCOUNTER — Other Ambulatory Visit (HOSPITAL_COMMUNITY)
Admission: RE | Admit: 2020-12-06 | Discharge: 2020-12-06 | Disposition: A | Discharge status: Home / Self Care | Payer: BC Managed Care – PPO | Source: Ambulatory Visit | Attending: Nurse Practitioner | Admitting: Nurse Practitioner

## 2020-12-06 ENCOUNTER — Other Ambulatory Visit: Payer: Self-pay

## 2020-12-06 ENCOUNTER — Encounter: Payer: Self-pay | Admitting: Nurse Practitioner

## 2020-12-06 VITALS — BP 119/81 | HR 86 | Temp 98.4°F | Ht 63.0 in | Wt 203.8 lb

## 2020-12-06 DIAGNOSIS — E559 Vitamin D deficiency, unspecified: Secondary | ICD-10-CM

## 2020-12-06 DIAGNOSIS — Z6836 Body mass index (BMI) 36.0-36.9, adult: Secondary | ICD-10-CM

## 2020-12-06 DIAGNOSIS — Z1231 Encounter for screening mammogram for malignant neoplasm of breast: Secondary | ICD-10-CM

## 2020-12-06 DIAGNOSIS — F0632 Mood disorder due to known physiological condition with major depressive-like episode: Secondary | ICD-10-CM | POA: Diagnosis not present

## 2020-12-06 DIAGNOSIS — G35D Multiple sclerosis, unspecified: Secondary | ICD-10-CM

## 2020-12-06 DIAGNOSIS — G35 Multiple sclerosis: Secondary | ICD-10-CM | POA: Diagnosis not present

## 2020-12-06 DIAGNOSIS — Z0000 Encounter for general adult medical examination without abnormal findings: Secondary | ICD-10-CM | POA: Diagnosis not present

## 2020-12-06 DIAGNOSIS — Z01419 Encounter for gynecological examination (general) (routine) without abnormal findings: Secondary | ICD-10-CM | POA: Diagnosis not present

## 2020-12-06 DIAGNOSIS — G4719 Other hypersomnia: Secondary | ICD-10-CM

## 2020-12-06 DIAGNOSIS — Z1151 Encounter for screening for human papillomavirus (HPV): Secondary | ICD-10-CM | POA: Diagnosis not present

## 2020-12-06 DIAGNOSIS — Z803 Family history of malignant neoplasm of breast: Secondary | ICD-10-CM

## 2020-12-06 DIAGNOSIS — E6609 Other obesity due to excess calories: Secondary | ICD-10-CM

## 2020-12-06 DIAGNOSIS — F411 Generalized anxiety disorder: Secondary | ICD-10-CM | POA: Diagnosis not present

## 2020-12-06 DIAGNOSIS — F988 Other specified behavioral and emotional disorders with onset usually occurring in childhood and adolescence: Secondary | ICD-10-CM

## 2020-12-06 DIAGNOSIS — Z124 Encounter for screening for malignant neoplasm of cervix: Secondary | ICD-10-CM | POA: Diagnosis not present

## 2020-12-06 DIAGNOSIS — R7301 Impaired fasting glucose: Secondary | ICD-10-CM

## 2020-12-06 DIAGNOSIS — R051 Acute cough: Secondary | ICD-10-CM | POA: Diagnosis not present

## 2020-12-06 DIAGNOSIS — N898 Other specified noninflammatory disorders of vagina: Secondary | ICD-10-CM

## 2020-12-06 DIAGNOSIS — F5105 Insomnia due to other mental disorder: Secondary | ICD-10-CM

## 2020-12-06 DIAGNOSIS — E7800 Pure hypercholesterolemia, unspecified: Secondary | ICD-10-CM

## 2020-12-06 DIAGNOSIS — G43009 Migraine without aura, not intractable, without status migrainosus: Secondary | ICD-10-CM

## 2020-12-06 LAB — WET PREP FOR TRICH, YEAST, CLUE
Clue Cell Exam: NEGATIVE
Trichomonas Exam: NEGATIVE
Yeast Exam: NEGATIVE

## 2020-12-06 LAB — VERITOR FLU A/B WAIVED
Influenza A: NEGATIVE
Influenza B: NEGATIVE

## 2020-12-06 MED ORDER — BENZONATATE 100 MG PO CAPS: 100.0000 mg | ORAL_CAPSULE | Freq: Two times a day (BID) | ORAL | 0 refills | Status: DC | PRN

## 2020-12-06 MED ORDER — AMOXICILLIN-POT CLAVULANATE 875-125 MG PO TABS: 1.0000 | ORAL_TABLET | Freq: Two times a day (BID) | ORAL | 0 refills | Status: AC

## 2020-12-06 MED ORDER — PREDNISONE 20 MG PO TABS: 40.0000 mg | ORAL_TABLET | Freq: Every day | ORAL | 0 refills | Status: AC

## 2020-12-06 MED ORDER — SERTRALINE HCL 100 MG PO TABS: 200.0000 mg | ORAL_TABLET | Freq: Every day | ORAL | 4 refills | Status: DC

## 2020-12-06 NOTE — Assessment & Plan Note (Signed)
BMI 36.10 and is interested in weight loss.  Discussed medications and she will check with insurance coverage.  Recommended eating smaller high protein, low fat meals more frequently and exercising 30 mins a day 5 times a week with a goal of 10-15lb weight loss in the next 3 months. Patient voiced their understanding and motivation to adhere to these recommendations.

## 2020-12-06 NOTE — Progress Notes (Signed)
Contacted via MyChart   I have some good news so far.  Wet prep for vaginal infection is negative:)  Flu testing is negative:)

## 2020-12-06 NOTE — Assessment & Plan Note (Signed)
Chronic, stable.  Denies SI/HI.  Followed by psychiatry, recent note reviewed.  Continue current medication regimen as prescribed by them.

## 2020-12-06 NOTE — Assessment & Plan Note (Signed)
Noted on past labs with ASCVD 0.6%, continue to monitor and diet focus.  Lipid panel and CMP today.

## 2020-12-06 NOTE — Assessment & Plan Note (Signed)
Chronic, ongoing and stable with Topamax.  Continue collaboration with neurology and recent notes reviewed.

## 2020-12-06 NOTE — Assessment & Plan Note (Signed)
Ongoing for over a week with negative Covid at home.  Will obtain Covid and flu testing in office today -- flu negative.  Scripts for Augmentin, Prednisone, and Tessalon sent.  Recommend: - Increased rest - Increasing Fluids - Acetaminophen / ibuprofen as needed for fever/pain.  - Salt water gargling, chloraseptic spray and throat lozenges - Mucinex.  Return to office if worsening or ongoing cough after treatment.

## 2020-12-06 NOTE — Assessment & Plan Note (Signed)
Chronic, ongoing -- with infusions.  Continue to collaborate with neurology and current treatment. 

## 2020-12-06 NOTE — Assessment & Plan Note (Signed)
Chronic, ongoing.  Continue current supplement and check Vit D today. 

## 2020-12-06 NOTE — Assessment & Plan Note (Signed)
With MS, followed by neurology and psychiatry, continue current medication regimen as prescribed by them.

## 2020-12-06 NOTE — Progress Notes (Signed)
BP 119/81   Pulse 86   Temp 98.4 F (36.9 C) (Oral)   Ht _0  (1.6 m)   Wt 203 lb 12.8 oz (92.4 kg)   SpO2 97%   BMI 36.10 kg/m    Subjective:    Patient ID: [name], female    DOB: January 29, 1981, 40 y.o.   MRN: [id number]  HPI: [name] is a 40 y.o. female presenting on 12/06/2020 for comprehensive medical examination. Current medical complaints include:none  She currently lives with: husband and children Menopausal Symptoms: no   She is interested in weight loss, we dicussed various treatment options and she will check with insurance on medications.  The 10-year ASCVD risk score (Arnett DK, et al., 2019) is: 0.6%   Values used to calculate the score:     Age: 44 years     Sex: Female     Is Non-Hispanic African American: No     Diabetic: No     Tobacco smoker: No     Systolic Blood Pressure: 127 mmHg     Is BP treated: No     HDL Cholesterol: 55 mg/dL     Total Cholesterol: 206 mg/dL  UPPER RESPIRATORY TRACT INFECTION Started one week ago.  Started with cough, drainage, loss of voice == got better shortly and then returned.  When she coughs she can hear it breaking up.  Covid test at home negative. Fever: no Cough: yes Shortness of breath: yes, after coughing fit Wheezing: no Chest pain: no Chest tightness: no Chest congestion: yes Nasal congestion: yes Runny nose: yes Post nasal drip: yes Sneezing: no Sore throat:  mild Swollen glands: no Sinus pressure: no Headache: no Face pain: no Toothache: no Ear pain: none Ear pressure: none Eyes red/itching:no Eye drainage/crusting: no  Vomiting: no Rash: no Fatigue: yes Sick contacts: yes Strep contacts: no  Context: fluctuating Recurrent sinusitis: no Relief with OTC cold/cough medications: no  Treatments attempted: Allegra D and Flonase  MIGRAINES & MS Continues on Topamax and is followed by neurology for this and her MS -- last saw 06/14/20.  Does infusions -- last  09/20/20. Headache status at time of visit: asymptomatic Treatments attempted: none, triptans and topamax   Aura: yes Nausea:  yes Vomiting: no Photophobia:  no Phonophobia:  no Effect on social functioning:  no Numbers of missed days of school/work each month: 0 Confusion:  no Gait disturbance/ataxia:  no Behavioral changes:  no Fevers:  no    DEPRESSION Followed by psychiatry at [facility], Dr. Shea Evans.  Last seen 04/14/20.  Followed by therapist. Continues on Sertraline, Wellbutrin, Adderall, and Trazodone + Propranolol as needed. Mood status: stable Satisfied with current treatment?: yes Symptom severity: mild  Duration of current treatment : chronic Side effects: no Medication compliance: good compliance Psychotherapy/counseling: yes current Depressed mood: yes Anxious mood: no Anhedonia: no Significant weight loss or gain: no Insomnia: no hard to fall asleep Fatigue: no Feelings of worthlessness or guilt: no Impaired concentration/indecisiveness: no Suicidal ideations: no Hopelessness: no Crying spells: no Depression screen [facility] 2/9 12/06/2020 12/06/2020 06/07/2020 12/06/2019 06/04/2019  Decreased Interest _1 Down, Depressed, Hopeless 0 1 0 1 1  PHQ - 2 Score _2 Altered sleeping 2 2 0 2 2  Tired, decreased energy _3 Change in appetite _4 Feeling bad or failure about yourself  1 0 0 0 0  Trouble concentrating  0 _0 Moving slowly or fidgety/restless 2 0 0 0 0  Suicidal thoughts 0 0 0 0 0  PHQ-9 Score _1 Difficult doing work/chores Not difficult at all Somewhat difficult Not difficult at all Somewhat difficult Somewhat difficult  Some recent data might be hidden   GAD 7 : Generalized Anxiety Score 12/06/2020 12/06/2020 06/07/2020 06/04/2019  Nervous, Anxious, on Edge 1 1 0 2  Control/stop worrying 0 _2 Worry too much - different things _3 Trouble relaxing _4 Restless _5 0  Easily annoyed or irritable _6 Afraid - awful might happen 0 1 0 0  Total GAD 7 Score _7 Anxiety Difficulty Somewhat difficult Somewhat difficult - Somewhat difficult    The patient does not have a history of falls. I did not complete a risk assessment for falls. A plan of care for falls was not documented.   Past Medical History:  Past Medical History:  Diagnosis Date   Depression    Migraine    MS (multiple sclerosis) ([place])    Multiple sclerosis ([place])    Spinal stenosis     Surgical History:  Past Surgical History:  Procedure Laterality Date   NO PAST SURGERIES      Medications:  Current Outpatient Medications on File Prior to Visit  Medication Sig   albuterol (VENTOLIN HFA) 108 (90 Base) MCG/ACT inhaler TAKE 2 PUFFS BY MOUTH EVERY 6 HOURS AS NEEDED FOR WHEEZE OR SHORTNESS OF BREATH   baclofen (LIORESAL) 10 MG tablet TAKE 1 TABLET BY MOUTH FOUR TIMES A DAY AS NEEDED FOR MUSCLE SPASMS   buPROPion (WELLBUTRIN XL) 150 MG 24 hr tablet TAKE 1 TABLET BY MOUTH EVERY DAY   Cholecalciferol 25 MCG (1000 UT) tablet Take 5,000 Units by mouth daily.    fluticasone (FLONASE) 50 MCG/ACT nasal spray Place 2 sprays into both nostrils daily.   ibuprofen (ADVIL) 800 MG tablet Take 1 tablet (800 mg total) by mouth 3 (three) times daily.   ocrelizumab 600 mg in sodium chloride 0.9 % 500 mL Inject 600 mg into the vein every 6 (six) months.   propranolol (INDERAL) 10 MG tablet TAKE 1 TABLET (10 MG TOTAL) BY MOUTH DAILY AS NEEDED. FOR SEVERE ANXIETY ATTACK   topiramate (TOPAMAX) 100 MG tablet TAKE 1 TABLET BY MOUTH EVERYDAY AT BEDTIME   traZODone (DESYREL) 100 MG tablet Take 1 tablet (100 mg total) by mouth at bedtime as needed for sleep.   amphetamine-dextroamphetamine (ADDERALL XR) 30 MG 24 hr capsule Take 1 capsule (30 mg total) by mouth daily.   amphetamine-dextroamphetamine (ADDERALL) 10 MG tablet Take one or two po in the aftrernoon   No current facility-administered medications on file prior to visit.     Allergies:  Allergies  Allergen Reactions   Flexeril [Cyclobenzaprine] Other (See Comments)    Drowsy when taking during the day     Social History:  Social History   Socioeconomic History   Marital status: Married    Spouse name: steven ambrose   Number of children: 3   Years of education: Not on file   Highest education level: Bachelor's degree (e.g., BA, AB, BS)  Occupational History   Not on file  Tobacco Use   Smoking status: Never   Smokeless tobacco: Never  Vaping Use   Vaping Use: Never used  Substance and Sexual Activity  Alcohol use: Not Currently    Alcohol/week: 0.0 standard drinks    Comment: occasional/fim   Drug use: No   Sexual activity: Yes    Birth control/protection: I.U.D.  Other Topics Concern   Not on file  Social History Narrative   Not on file   Social Determinants of Health   Financial Resource Strain: Not on file  Food Insecurity: Not on file  Transportation Needs: Not on file  Physical Activity: Not on file  Stress: Not on file  Social Connections: Not on file  Intimate Partner Violence: Not on file   Social History   Tobacco Use  Smoking Status Never  Smokeless Tobacco Never   Social History   Substance and Sexual Activity  Alcohol Use Not Currently   Alcohol/week: 0.0 standard drinks   Comment: occasional/fim    Family History:  Family History  Problem Relation Age of Onset   Multiple myeloma Mother    Multiple sclerosis Mother    Obesity Sister    Anxiety disorder Daughter    Multiple sclerosis Maternal Aunt     Past medical history, surgical history, medications, allergies, family history and social history reviewed with patient today and changes made to appropriate areas of the chart.   Review of Systems - negative All other ROS negative except what is listed above and in the HPI.      Objective:    BP 119/81   Pulse 86   Temp 98.4 F (36.9 C) (Oral)   Ht _0  (1.6 m)   Wt 203 lb 12.8 oz (92.4  kg)   SpO2 97%   BMI 36.10 kg/m   Wt Readings from Last 3 Encounters:  12/06/20 203 lb 12.8 oz (92.4 kg)  06/14/20 183 lb (83 kg)  06/07/20 182 lb 6.4 oz (82.7 kg)    Physical Exam Vitals and nursing note reviewed. Exam conducted with a chaperone present.  Constitutional:      General: She is awake. She is not in acute distress.    Appearance: She is well-developed and well-groomed. She is obese. She is not ill-appearing or toxic-appearing.  HENT:     Head: Normocephalic and atraumatic.     Right Ear: Hearing, tympanic membrane, ear canal and external ear normal. No drainage.     Left Ear: Hearing, tympanic membrane, ear canal and external ear normal. No drainage.     Nose: Nose normal.     Right Sinus: No maxillary sinus tenderness or frontal sinus tenderness.     Left Sinus: No maxillary sinus tenderness or frontal sinus tenderness.     Mouth/Throat:     Mouth: Mucous membranes are moist.     Pharynx: Oropharynx is clear. Uvula midline. No pharyngeal swelling, oropharyngeal exudate or posterior oropharyngeal erythema.  Eyes:     General: Lids are normal.        Right eye: No discharge.        Left eye: No discharge.     Extraocular Movements: Extraocular movements intact.     Conjunctiva/sclera: Conjunctivae normal.     Pupils: Pupils are equal, round, and reactive to light.     Visual Fields: Right eye visual fields normal and left eye visual fields normal.  Neck:     Thyroid: No thyromegaly.     Vascular: No carotid bruit.     Trachea: Trachea normal.  Cardiovascular:     Rate and Rhythm: Normal rate and regular rhythm.     Heart sounds: Normal heart sounds.  No murmur heard.   No gallop.  Pulmonary:     Effort: Pulmonary effort is normal. No accessory muscle usage or respiratory distress.     Breath sounds: Normal breath sounds.  Chest:  Breasts:    Right: Normal.     Left: Normal.  Abdominal:     General: Bowel sounds are normal.     Palpations: Abdomen is soft.  There is no hepatomegaly or splenomegaly.     Tenderness: There is no abdominal tenderness.     Hernia: There is no hernia in the left inguinal area or right inguinal area.  Genitourinary:    Exam position: Lithotomy position.     Labia:        Right: No rash.        Left: No rash.      Vagina: Normal.     Cervix: Normal.     Uterus: Normal.      Adnexa: Right adnexa normal and left adnexa normal.     Comments: IUD strings noted, cervix anterior and viewed, some slight yellow discharge noted, pap obtained and sent. Musculoskeletal:        General: Normal range of motion.     Cervical back: Normal range of motion and neck supple.     Right lower leg: No edema.     Left lower leg: No edema.  Lymphadenopathy:     Head:     Right side of head: No submental, submandibular, tonsillar, preauricular or posterior auricular adenopathy.     Left side of head: No submental, submandibular, tonsillar, preauricular or posterior auricular adenopathy.     Cervical: No cervical adenopathy.     Upper Body:     Right upper body: No supraclavicular, axillary or pectoral adenopathy.     Left upper body: No supraclavicular, axillary or pectoral adenopathy.  Skin:    General: Skin is warm and dry.     Capillary Refill: Capillary refill takes less than 2 seconds.     Findings: No rash.  Neurological:     Mental Status: She is alert and oriented to person, place, and time.     Gait: Gait is intact.     Deep Tendon Reflexes: Reflexes are normal and symmetric.     Reflex Scores:      Brachioradialis reflexes are 2+ on the right side and 2+ on the left side.      Patellar reflexes are 2+ on the right side and 2+ on the left side. Psychiatric:        Attention and Perception: Attention normal.        Mood and Affect: Mood normal.        Speech: Speech normal.        Behavior: Behavior normal. Behavior is cooperative.        Thought Content: Thought content normal.        Judgment: Judgment normal.    Results for orders placed or performed in visit on 12/06/20  WET PREP FOR [place], YEAST, CLUE   Naso  Result Value Ref Range   Trichomonas Exam Negative Negative   Yeast Exam Negative Negative   Clue Cell Exam Negative Negative  Veritor Flu A/B Waived  Result Value Ref Range   Influenza A Negative Negative   Influenza B Negative Negative      Assessment & Plan:   Problem List Items Addressed This Visit       Cardiovascular and Mediastinum   Migraine without aura and without status  migrainosus, not intractable    Chronic, ongoing and stable with Topamax.  Continue collaboration with neurology and recent notes reviewed.      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  Denies SI/HI.  Followed by psychiatry, recent note reviewed.  Continue current medication regimen as prescribed by them.        Multiple sclerosis (HCC) - Primary    Chronic, ongoing -- with infusions.  Continue to collaborate with neurology and current treatment.      Relevant Orders   CBC with Differential/Platelet   Comprehensive metabolic panel   TSH     Other   Acute cough    Ongoing for over a week with negative Covid at home.  Will obtain Covid and flu testing in office today -- flu negative.  Scripts for Augmentin, Prednisone, and Tessalon sent.  Recommend: - Increased rest - Increasing Fluids - Acetaminophen / ibuprofen as needed for fever/pain.  - Salt water gargling, chloraseptic spray and throat lozenges - Mucinex.  Return to office if worsening or ongoing cough after treatment.      Relevant Orders   Novel Coronavirus, NAA ([facility])   Veritor Flu A/B Waived (Completed)   Attention deficit disorder    Chronic, ongoing, followed by psychiatry, continue current medication regimen and collaboration with them.      Elevated low density  lipoprotein (LDL) cholesterol level    Noted on past labs with ASCVD 0.6%, continue to monitor and diet focus.  Lipid panel and CMP today.      Relevant Orders   Comprehensive metabolic panel   Lipid Panel w/o Chol/HDL Ratio   Excessive daytime sleepiness    With MS, followed by neurology and psychiatry, continue current medication regimen as prescribed by them.      Family history of breast cancer    Maternal aunt and great grandmother, will start mammogram screening due to history.  Order placed.      GAD (generalized anxiety disorder)    Chronic, stable.  Denies SI/HI.  Will continue collaboration with psychiatry and current medication regimen as prescribed by them.      Relevant Medications   sertraline (ZOLOFT) 100 MG tablet   Insomnia due to mental disorder    Chronic, ongoing secondary to MS.  Continue Trazodone and collaboration with psychiatry and therapy.        Obesity    BMI 36.10 and is interested in weight loss.  Discussed medications and she will check with insurance coverage.  Recommended eating smaller high protein, low fat meals more frequently and exercising 30 mins a day 5 times a week with a goal of 10-15lb weight loss in the next 3 months. Patient voiced their understanding and motivation to adhere to these recommendations.       Vitamin D deficiency    Chronic, ongoing.  Continue current supplement and check Vit D today.      Relevant Orders   VITAMIN D 25 Hydroxy (Vit-D Deficiency, Fractures)   Other Visit Diagnoses     IFG (impaired fasting glucose)       Check A1c today and monitor closely.   Relevant Orders   HgB A1c   Vaginal discharge       Wet prep negative on check, pap obtained.   Relevant Orders   WET PREP FOR [place], [place], [place]  Cervical cancer screening       Pap obtained today and sent.   Relevant Orders   Cytology - PAP   Encounter for annual physical exam       Annual physical today with labs obtained.  Health maintenance  reviewed, currently ill will avoid vaccinations today.   Relevant Orders   CBC with Differential/Platelet   TSH   Encounter for screening mammogram for malignant neoplasm of breast       Mammogram ordered today   Relevant Orders   MM 3D SCREEN BREAST BILATERAL        Follow up plan: Return in about 6 months (around 06/05/2021) for MIGRAINES, MS, MOOD.   LABORATORY TESTING:  - Pap smear: done today  IMMUNIZATIONS:   - Tdap: Tetanus vaccination status reviewed: not to get due to MS. - Influenza: sick today - Pneumovax: Not applicable - Prevnar: Not applicable - HPV: Not applicable - Zostavax vaccine: Not applicable  SCREENING: -Mammogram: Ordered today -- family history -- aunt and maternal great grandmother - Colonoscopy: Not applicable  - Bone Density: Not applicable  -Hearing Test: Not applicable  -Spirometry: Not applicable   PATIENT COUNSELING:   Advised to take 1 mg of folate supplement per day if capable of pregnancy.   Sexuality: Discussed sexually transmitted diseases, partner selection, use of condoms, avoidance of unintended pregnancy  and contraceptive alternatives.   Advised to avoid cigarette smoking.  I discussed with the patient that most people either abstain from alcohol or drink within safe limits (<=14/week and <=4 drinks/occasion for males, <=7/weeks and <= 3 drinks/occasion for females) and that the risk for alcohol disorders and other health effects rises proportionally with the number of drinks per week and how often a drinker exceeds daily limits.  Discussed cessation/primary prevention of drug use and availability of treatment for abuse.   Diet: Encouraged to adjust caloric intake to maintain  or achieve ideal body weight, to reduce intake of dietary saturated fat and total fat, to limit sodium intake by avoiding high sodium foods and not adding table salt, and to maintain adequate dietary potassium and calcium preferably from fresh fruits,  vegetables, and low-fat dairy products.    Stressed the importance of regular exercise  Injury prevention: Discussed safety belts, safety helmets, smoke detector, smoking near bedding or upholstery.   Dental health: Discussed importance of regular tooth brushing, flossing, and dental visits.    NEXT PREVENTATIVE PHYSICAL DUE IN 1 YEAR. Return in about 6 months (around 06/05/2021) for MIGRAINES, MS, MOOD.

## 2020-12-06 NOTE — Assessment & Plan Note (Signed)
Chronic, ongoing, followed by psychiatry, continue current medication regimen and collaboration with them.

## 2020-12-06 NOTE — Assessment & Plan Note (Signed)
Maternal aunt and great grandmother, will start mammogram screening due to history.  Order placed. 

## 2020-12-06 NOTE — Assessment & Plan Note (Signed)
Chronic, stable.  Denies SI/HI.  Will continue collaboration with psychiatry and current medication regimen as prescribed by them.

## 2020-12-06 NOTE — Assessment & Plan Note (Signed)
Chronic, ongoing secondary to MS.  Continue Trazodone and collaboration with psychiatry and therapy.

## 2020-12-06 NOTE — Patient Instructions (Addendum)
LOOK INTO THESE WEIGHT LOSS MEDICATIONS WITH INSURANCE == CONTRAVE (HAS WELLBUTRIN IN IT) -- INJECTABLES SAXENDA (DAILY) AND OZEMPIC OR WEGOVY (WEEKLY)  [facility] at [facility]  Address: [address] [place], [place], [place] [address]  Phone: [phone]   Healthy Eating Following a healthy eating pattern may help you to achieve and maintain a healthy body weight, reduce the risk of chronic disease, and live a long and productive life. It is important to follow a healthy eating pattern at an appropriate calorie level for your body. Your nutritional needs should be met primarily through food by choosing a variety of nutrient-rich foods. What are tips for following this plan? Reading food labels Read labels and choose the following: Reduced or low sodium. Juices with 100% fruit juice. Foods with low saturated fats and high polyunsaturated and monounsaturated fats. Foods with whole grains, such as whole wheat, cracked wheat, brown rice, and wild rice. Whole grains that are fortified with folic acid. This is recommended for women who are pregnant or who want to become pregnant. Read labels and avoid the following: Foods with a lot of added sugars. These include foods that contain brown sugar, corn sweetener, corn syrup, dextrose, fructose, glucose, high-fructose corn syrup, honey, invert sugar, lactose, malt syrup, maltose, molasses, raw sugar, sucrose, trehalose, or turbinado sugar. Do not eat more than the following amounts of added sugar per day: 6 teaspoons (25 g) for women. 9 teaspoons (38 g) for men. Foods that contain processed or refined starches and grains. Refined grain products, such as white flour, degermed cornmeal, white bread, and white rice. Shopping Choose nutrient-rich snacks, such as vegetables, whole fruits, and nuts. Avoid high-calorie and high-sugar snacks, such as potato chips, fruit snacks, and candy. Use oil-based dressings and spreads on foods  instead of solid fats such as butter, stick margarine, or cream cheese. Limit pre-made sauces, mixes, and "instant" products such as flavored rice, instant noodles, and ready-made pasta. Try more plant-protein sources, such as tofu, tempeh, black beans, edamame, lentils, nuts, and seeds. Explore eating plans such as the Mediterranean diet or vegetarian diet. Cooking Use oil to saut or stir-fry foods instead of solid fats such as butter, stick margarine, or lard. Try baking, boiling, grilling, or broiling instead of frying. Remove the fatty part of meats before cooking. Steam vegetables in water or broth. Meal planning  At meals, imagine dividing your plate into fourths: One-half of your plate is fruits and vegetables. One-fourth of your plate is whole grains. One-fourth of your plate is protein, especially lean meats, poultry, eggs, tofu, beans, or nuts. Include low-fat dairy as part of your daily diet. Lifestyle Choose healthy options in all settings, including home, work, school, restaurants, or stores. Prepare your food safely: Wash your hands after handling raw meats. Keep food preparation surfaces clean by regularly washing with hot, soapy water. Keep raw meats separate from ready-to-eat foods, such as fruits and vegetables. Cook seafood, meat, poultry, and eggs to the recommended internal temperature. Store foods at safe temperatures. In general: Keep cold foods at 66F (4.4C) or below. Keep hot foods at 166F (60C) or above. Keep your freezer at [facility] (-17.8C) or below. Foods are no longer safe to eat when they have been between the temperatures of 40-166F (4.4-60C) for more than 2 hours. What foods should I eat? Fruits Aim to eat 2 cup-equivalents of fresh, canned (in natural juice), or frozen fruits each day. Examples of 1 cup-equivalent of fruit include 1 small apple, 8  large strawberries, 1 cup canned fruit,  cup dried fruit, or 1 cup 100% juice. Vegetables Aim to eat  2-3 cup-equivalents of fresh and frozen vegetables each day, including different varieties and colors. Examples of 1 cup-equivalent of vegetables include 2 medium carrots, 2 cups raw, leafy greens, 1 cup chopped vegetable (raw or cooked), or 1 medium baked potato. Grains Aim to eat 6 ounce-equivalents of whole grains each day. Examples of 1 ounce-equivalent of grains include 1 slice of bread, 1 cup ready-to-eat cereal, 3 cups popcorn, or  cup cooked rice, pasta, or cereal. Meats and other proteins Aim to eat 5-6 ounce-equivalents of protein each day. Examples of 1 ounce-equivalent of protein include 1 egg, 1/2 cup nuts or seeds, or 1 tablespoon (16 g) peanut butter. A cut of meat or fish that is the size of a deck of cards is about 3-4 ounce-equivalents. Of the protein you eat each week, try to have at least 8 ounces come from seafood. This includes salmon, trout, herring, and anchovies. Dairy Aim to eat 3 cup-equivalents of fat-free or low-fat dairy each day. Examples of 1 cup-equivalent of dairy include 1 cup (240 mL) milk, 8 ounces (250 g) yogurt, 1 ounces (44 g) natural cheese, or 1 cup (240 mL) fortified soy milk. Fats and oils Aim for about 5 teaspoons (21 g) per day. Choose monounsaturated fats, such as canola and olive oils, avocados, peanut butter, and most nuts, or polyunsaturated fats, such as sunflower, corn, and soybean oils, walnuts, pine nuts, sesame seeds, sunflower seeds, and flaxseed. Beverages Aim for six 8-oz glasses of water per day. Limit coffee to three to five 8-oz cups per day. Limit caffeinated beverages that have added calories, such as soda and energy drinks. Limit alcohol intake to no more than 1 drink a day for nonpregnant women and 2 drinks a day for men. One drink equals 12 oz of beer (355 mL), 5 oz of wine (148 mL), or 1 oz of hard liquor (44 mL). Seasoning and other foods Avoid adding excess amounts of salt to your foods. Try flavoring foods with herbs and spices  instead of salt. Avoid adding sugar to foods. Try using oil-based dressings, sauces, and spreads instead of solid fats. This information is based on general [place] nutrition guidelines. For more information, visit [employer].es. Exact amounts may vary based on your nutrition needs. Summary A healthy eating plan may help you to maintain a healthy weight, reduce the risk of chronic diseases, and stay active throughout your life. Plan your meals. Make sure you eat the right portions of a variety of nutrient-rich foods. Try baking, boiling, grilling, or broiling instead of frying. Choose healthy options in all settings, including home, work, school, restaurants, or stores. This information is not intended to replace advice given to you by your health care provider. Make sure you discuss any questions you have with your health care provider. Document Revised: 05/05/2017 Document Reviewed: 05/05/2017 Elsevier Patient Education  [place].

## 2020-12-07 LAB — COMPREHENSIVE METABOLIC PANEL
ALT: 10 IU/L (ref 0–32)
AST: 11 IU/L (ref 0–40)
Albumin/Globulin Ratio: 2.4 — ABNORMAL HIGH (ref 1.2–2.2)
Albumin: 4.7 g/dL (ref 3.8–4.8)
Alkaline Phosphatase: 127 IU/L — ABNORMAL HIGH (ref 44–121)
BUN/Creatinine Ratio: 19 (ref 9–23)
BUN: 14 mg/dL (ref 6–24)
Bilirubin Total: <0.2 mg/dL (ref 0.0–1.2)
CO2: 24 mmol/L (ref 20–29)
Calcium: 9.1 mg/dL (ref 8.7–10.2)
Chloride: 103 mmol/L (ref 96–106)
Creatinine, Ser: 0.75 mg/dL (ref 0.57–1.00)
Globulin, Total: 2 g/dL (ref 1.5–4.5)
Glucose: 76 mg/dL (ref 70–99)
Potassium: 4.4 mmol/L (ref 3.5–5.2)
Sodium: 141 mmol/L (ref 134–144)
Total Protein: 6.7 g/dL (ref 6.0–8.5)
eGFR: 102 mL/min/{1.73_m2} (ref >59)

## 2020-12-07 LAB — LIPID PANEL W/O CHOL/HDL RATIO
Cholesterol, Total: 173 mg/dL (ref 100–199)
HDL: 58 mg/dL (ref >39)
LDL Chol Calc (NIH): 98 mg/dL (ref 0–99)
Triglycerides: 92 mg/dL (ref 0–149)
VLDL Cholesterol Cal: 17 mg/dL (ref 5–40)

## 2020-12-07 LAB — SARS-COV-2, NAA 2 DAY TAT

## 2020-12-07 LAB — CBC WITH DIFFERENTIAL/PLATELET
Basophils Absolute: 0.1 10*3/uL (ref 0.0–0.2)
Basos: 0 %
EOS (ABSOLUTE): 0.3 10*3/uL (ref 0.0–0.4)
Eos: 2 %
Hematocrit: 41.4 % (ref 34.0–46.6)
Hemoglobin: 13.5 g/dL (ref 11.1–15.9)
Immature Grans (Abs): 0 10*3/uL (ref 0.0–0.1)
Immature Granulocytes: 0 %
Lymphocytes Absolute: 1.2 10*3/uL (ref 0.7–3.1)
Lymphs: 10 %
MCH: 26.7 pg (ref 26.6–33.0)
MCHC: 32.6 g/dL (ref 31.5–35.7)
MCV: 82 fL (ref 79–97)
Monocytes Absolute: 0.7 10*3/uL (ref 0.1–0.9)
Monocytes: 6 %
Neutrophils Absolute: 9.5 10*3/uL — ABNORMAL HIGH (ref 1.4–7.0)
Neutrophils: 82 %
Platelets: 388 10*3/uL (ref 150–450)
RBC: 5.05 x10E6/uL (ref 3.77–5.28)
RDW: 13.1 % (ref 11.7–15.4)
WBC: 11.7 10*3/uL — ABNORMAL HIGH (ref 3.4–10.8)

## 2020-12-07 LAB — HEMOGLOBIN A1C
Est. average glucose Bld gHb Est-mCnc: 111 mg/dL
Hgb A1c MFr Bld: 5.5 % (ref 4.8–5.6)

## 2020-12-07 LAB — NOVEL CORONAVIRUS, NAA: SARS-CoV-2, NAA: NOT DETECTED

## 2020-12-07 LAB — VITAMIN D 25 HYDROXY (VIT D DEFICIENCY, FRACTURES): Vit D, 25-Hydroxy: 41.8 ng/mL (ref 30.0–100.0)

## 2020-12-07 LAB — TSH: TSH: 1.1 u[IU]/mL (ref 0.450–4.500)

## 2020-12-07 NOTE — Progress Notes (Signed)
Contacted via [place] evening [place], your labs have returned: - CBC does show elevation in white blood cell count and neutrophils, probably related to current bacterial sinusitis.  Continue treatment I ordered.  Your Covid and flu are negative. - CMP shows normal kidney function, creatinine and eGFR, and liver function, AST and ALT.  Alkaline phosphatase is a little elevated, we will continue to monitor this as we sometimes see this with gall bladder issues. - Cholesterol levels look great. - Thyroid and Vitamin D levels normal. - A1c shows no prediabetes or diabetes.  Any questions? Keep being amazing!!  Thank you for allowing me to participate in your care.  I appreciate you. Kindest regards, Erica Richwine

## 2020-12-08 LAB — CYTOLOGY - PAP
Adequacy: ABSENT
Comment: NEGATIVE
Diagnosis: NEGATIVE
High risk HPV: NEGATIVE

## 2020-12-08 NOTE — Progress Notes (Signed)
Contacted via MyChart   Good afternoon [name], your pap returned nice and normal.  Do not need to repeat for 5 years:)

## 2020-12-12 ENCOUNTER — Other Ambulatory Visit: Payer: Self-pay | Admitting: Neurology

## 2020-12-15 ENCOUNTER — Other Ambulatory Visit: Payer: Self-pay | Admitting: Neurology

## 2020-12-18 ENCOUNTER — Encounter: Payer: Self-pay | Admitting: Neurology

## 2020-12-18 ENCOUNTER — Ambulatory Visit: Payer: BC Managed Care – PPO | Admitting: Neurology

## 2020-12-18 ENCOUNTER — Other Ambulatory Visit: Payer: Self-pay

## 2020-12-18 ENCOUNTER — Other Ambulatory Visit: Payer: Self-pay | Admitting: Neurology

## 2020-12-18 VITALS — BP 118/84 | HR 90 | Ht 62.0 in | Wt 206.0 lb

## 2020-12-18 DIAGNOSIS — F988 Other specified behavioral and emotional disorders with onset usually occurring in childhood and adolescence: Secondary | ICD-10-CM

## 2020-12-18 DIAGNOSIS — R269 Unspecified abnormalities of gait and mobility: Secondary | ICD-10-CM

## 2020-12-18 DIAGNOSIS — G35 Multiple sclerosis: Secondary | ICD-10-CM

## 2020-12-18 DIAGNOSIS — Z79899 Other long term (current) drug therapy: Secondary | ICD-10-CM

## 2020-12-18 DIAGNOSIS — F5105 Insomnia due to other mental disorder: Secondary | ICD-10-CM | POA: Diagnosis not present

## 2020-12-18 DIAGNOSIS — F418 Other specified anxiety disorders: Secondary | ICD-10-CM

## 2020-12-18 MED ORDER — AMPHETAMINE-DEXTROAMPHETAMINE 20 MG PO TABS: ORAL_TABLET | ORAL | 0 refills | Status: DC

## 2020-12-18 MED ORDER — TRAZODONE HCL 100 MG PO TABS: 100.0000 mg | ORAL_TABLET | Freq: Every evening | ORAL | 3 refills | Status: DC | PRN

## 2020-12-18 MED ORDER — CONTRAVE 8-90 MG PO TB12: ORAL_TABLET | ORAL | 11 refills | Status: DC

## 2020-12-18 NOTE — Progress Notes (Signed)
[facility]  PATIENT: [name] DOB: October 28, 1980  REFERRING DOCTOR OR PCP:  Dr. Jennings Books  [facility]) SOURCE: Patient, a couple MRI reports and images on PACS. Lab reports, notes from Dr. Brigitte Pulse  _________________________________   HISTORICAL  CHIEF COMPLAINT:  Chief Complaint  Patient presents with   Follow-up    Pt alone, rm 1. Last ov 06/14/2020. Pt on ocrevus last infusion at [place] was 8/17 and next due 04/04/2021. Gait is worse today, thinks may be related to temp changes. She has had a hard time with getting adderall 30 mg XR on backorder. Needs refills on other meds.    HISTORY OF PRESENT ILLNESS:  [name] is a 40 y.o. woman with relapsing remitting multiple sclerosis.     Update 12/18/2020: She had her last OCrevus 09/20/2020.   No definite exacerbations.   MRI of the brain 09/06/2020 showed  a focus within the left middle cerebellar peduncle and a couple small foci in the periventricular white matter.  None of these enhance or appear to be acute.  Compared to the MRI dated 09/24/2017, there were no new lesions.    At the last visit, she had noted some verbal processing and other cognitive issues.   This is still occurring but not as uch    She feels it may be associated with going back to school.     She has trouble focusing.    She is noting more difficulty with verbal processing.   She  has some word finding errors and word substitution.     She plays piano and guitar and has some errors when she reads music.  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    She did have a relative low amount of stage N3  sleep (only 4.9%).   She is on baclofen 30 mg and trazodone 100 mg at night.     She is walking about the same with some reduced balance.   She has some some right leg weakness and  spasticity.   She stumbles some.  She has a right foot drop. She also takes baclofen (10 mg in am and 30 mg at night)  for spasticity with benefit.       Mood is doing well on Zoloft 200 mg and Wellbutrin 150 mg/d. Marland Kitchen  She also sees [facility].   She notes mild cognitive issues with reduced focus/attention, STM and processing.   She has noted more word finding diffiuculty.  She tries to stay cognitively active and instrumental in [place]..  Adderall XR in the morning with additional IR many but not all afternoons.   Due to a national shortage in Adderall XR, she has been out thos month  Migraine headaches have been well on topiramate.   She had Covid last year and again last month. The second time was mild while the first time wiped her out x weeks.      She is overweight (BMI = 37) and had discussed Contrave with her PCP NP.   She will need to stop wellbutrin if she goes on Contrave.     MS history:   In late June or early July 2011, she had the onset of numbness that went up to her belly button. She presented to the emergency room. An MRI of the brain showed  1 periventricular focus with maybe a second subtle periventricular focus. An MRI of the cervical spine showed an enhancing lesion adjacent to C3-C4. The diagnosis of possible MS was made but the evidence was not enough to begin a disease modifying therapy. In 2015, she had the onset of similar symptoms and also had decreased ability to use the left hand. An MRI of the brain at that time showed several new lesions not present in 2011, including a focus in the right middle cerebellar peduncle additionally, she had a lumbar puncture consistent with MS. Because of her symptoms, MRI changes and lumbar puncture results, she was diagnosed with clinical definite relapsing remitting MS and was started on Tecfidera. She has continued on Tecfidera. Over the past few months she has noted more difficulty with focus and concentration.     She also has  had some change in vision more recently. Because of these newer symptoms, and MRI was repeated 04/27/2015. The MRI of the cervical spine showed no new lesions. though the MRI of the brain showed 2  lesions not present in 2015, one in the left juxtacortical white matter and one in the left deep white matter.  She had a small exacerbation in August 2017 and received IV SOlu-Medrol.   MRI cervical spine 2017 an MRI of the brain show two spinal plaques, a definite rightt middle cerebellar peduncle plaque and a possible right pontine plaque and several foci in the hemispheres  MRI 01/2016 showed no new lesions.  She is JCV Ab positive.  She had an exacerbation in 2017 and early 2018 switch to ocrelizumab with her first doses in May 2018 (was on Gilenya).   She had another small exacerbation in July (right leg stiff/weak) and she received 3 days of IV site.  She went from using a cane to a walker for balance.  She tolerates the ocrelizumab well.     FH:  Her mom has MS and her MS stabilized after a bone marrow transplant for cancer.  MRI images MRI of the brain 09/24/2017 showed T2 hyperintense foci in the right middle cerebellar peduncle and hemispheres.  There were no changes compared to 09/08/2017.  MRI of the cervical spine 09/26/2017 showed foci within the spinal cord adjacent to C4 and C6-C7.  They were both present on an MRI from 04/04/2016.  There is mild spinal stenosis at C5-C6 due to disc protrusion.  MRI of the brain 09/06/2020 showed  a focus within the left middle cerebellar peduncle and a couple small foci in the periventricular white matter.  None of these enhance or appear to be acute.  Compared to the MRI dated 09/24/2017, there are no new lesions.  These are consistent with chronic demyelinating plaque associated with multiple sclerosis   REVIEW OF SYSTEMS: Constitutional: No fevers, chills, sweats, or change in appetite.  She has fatigue and insomnia. Eyes: No visual changes, double vision, eye  pain Ear, nose and throat: No hearing loss, ear pain, nasal congestion, sore throat Cardiovascular: No chest pain, palpitations Respiratory:  No shortness of breath at rest or with exertion.   No wheezes GastrointestinaI: No nausea, vomiting, diarrhea, abdominal pain, fecal incontinence Genitourinary:    She notes frequency, some hesitancy at times and nocturia. Musculoskeletal:  No neck pain, back pain Integumentary: No rash, pruritus, skin lesions Neurological: as above Psychiatric: as above Endocrine: No palpitations, diaphoresis, change in appetite, change in weigh or increased thirst Hematologic/Lymphatic:  No anemia, purpura, petechiae. Allergic/Immunologic: No itchy/runny eyes, nasal congestion,  recent allergic reactions, rashes  ALLERGIES: Allergies  Allergen Reactions   Flexeril [Cyclobenzaprine] Other (See Comments)    Drowsy when taking during the day     HOME MEDICATIONS:  Current Outpatient Medications:    albuterol (VENTOLIN HFA) 108 (90 Base) MCG/ACT inhaler, TAKE 2 PUFFS BY MOUTH EVERY 6 HOURS AS NEEDED FOR WHEEZE OR SHORTNESS OF BREATH, Disp: 18 each, Rfl: 0   baclofen (LIORESAL) 10 MG tablet, TAKE 1 TABLET BY MOUTH FOUR TIMES A DAY AS NEEDED FOR MUSCLE SPASMS, Disp: 360 tablet, Rfl: 3   benzonatate (TESSALON) 100 MG capsule, Take 1 capsule (100 mg total) by mouth 2 (two) times daily as needed for cough., Disp: 40 capsule, Rfl: 0   buPROPion (WELLBUTRIN XL) 150 MG 24 hr tablet, TAKE 1 TABLET BY MOUTH EVERY DAY, Disp: 90 tablet, Rfl: 2   Cholecalciferol 25 MCG (1000 UT) tablet, Take 5,000 Units by mouth daily. , Disp: , Rfl:    fluticasone (FLONASE) 50 MCG/ACT nasal spray, Place 2 sprays into both nostrils daily., Disp: 16 g, Rfl: 2   ibuprofen (ADVIL) 800 MG tablet, Take 1 tablet (800 mg total) by mouth 3 (three) times daily., Disp: 21 tablet, Rfl: 0   ocrelizumab 600 mg in sodium chloride 0.9 % 500 mL, Inject 600 mg into the vein every 6 (six) months., Disp: , Rfl:     propranolol (INDERAL) 10 MG tablet, TAKE 1 TABLET (10 MG TOTAL) BY MOUTH DAILY AS NEEDED. FOR SEVERE ANXIETY ATTACK, Disp: 30 tablet, Rfl: 1   sertraline (ZOLOFT) 100 MG tablet, Take 2 tablets (200 mg total) by mouth daily., Disp: 90 tablet, Rfl: 4   topiramate (TOPAMAX) 100 MG tablet, TAKE 1 TABLET BY MOUTH EVERYDAY AT BEDTIME, Disp: 90 tablet, Rfl: 3   amphetamine-dextroamphetamine (ADDERALL) 20 MG tablet, Take one po qAM and one po q mid-day, Disp: 60 tablet, Rfl: 0   traZODone (DESYREL) 100 MG tablet, Take 1 tablet (100 mg total) by mouth at bedtime as needed for sleep., Disp: 90 tablet, Rfl: 3  PAST MEDICAL HISTORY: Past Medical History:  Diagnosis Date   Depression    Migraine    MS (multiple sclerosis) (HCC)    Multiple sclerosis (HCC)    Spinal stenosis     PAST SURGICAL HISTORY: Past Surgical History:  Procedure Laterality Date   NO PAST SURGERIES      FAMILY HISTORY: Family History  Problem Relation Age of Onset   Multiple myeloma Mother    Multiple sclerosis Mother    Obesity Sister    Anxiety disorder Daughter    Multiple sclerosis Maternal Aunt     SOCIAL HISTORY:  Social History   Socioeconomic History   Marital status: Married    Spouse name: Psychiatric nurse   Number of children: 3   Years of education: Not on file   Highest education level: Bachelor's degree (e.g., BA, AB, BS)  Occupational History   Not on file  Tobacco Use   Smoking status: Never   Smokeless tobacco: Never  Vaping Use   Vaping Use: Never used  Substance and Sexual Activity   Alcohol use: Not Currently    Alcohol/week: 0.0 standard drinks    Comment: occasional/fim   Drug use: No   Sexual activity: Yes    Birth control/protection: I.U.D.  Other Topics Concern   Not on file  Social History Narrative   Not on file   Social Determinants of Health   Financial Resource Strain: Not on file  Food  Insecurity: Not on file  Transportation Needs: Not on file  Physical  Activity: Not on file  Stress: Not on file  Social Connections: Not on file  Intimate Partner Violence: Not on file     PHYSICAL EXAM  Vitals:   12/18/20 1306  BP: 118/84  Pulse: 90  Weight: 206 lb (93.4 kg)  Height: 5' 2"  (1.575 m)    Body mass index is 37.68 kg/m.   General: The patient is well-developed and well-nourished and in no acute distress    Musculoskeletal:    The neck is nontender with a good range of motion.  Neurologic Exam  Mental status: The patient is alert and oriented x 3 at the time of the examination. The patient has apparent normal recent and remote memory, with an apparently normal attention span and concentration ability.   Speech is normal.  Cranial nerves: Extraocular movements are full.  Facial strength and sensation is normal.  Trapezius strength is normal.  The tongue is midline, and the patient has symmetric elevation of the soft palate. No obvious hearing deficits are noted.  Motor:  Muscle bulk is normal.   Tone is mildly increased in legs, fairly equally.  Strength is  5 / 5 in all 4 extremities except 4+/5 in the feet.   Sensory: Sensory testing shows reduced vibration in the right leg and allodynia to touch in right leg.     Arms are symmetric.    Coordination: Cerebellar testing shows good finger-nose-finger but reduced heel-to-shin slightly worse on the right than left.     Gait and station: Station is normal.  She has a mild right foot drop.  The tandem gait is poor..    Romberg is negative..   Reflexes: Deep tendon reflexes are symmetric and increased in her legs. She has crossed adductor responses at the knees.       DIAGNOSTIC DATA (LABS, IMAGING, TESTING) - I reviewed patient records, labs, notes, testing and imaging myself where available.  Lab Results  Component Value Date   WBC 11.7 (H) 12/06/2020   HGB 13.5 12/06/2020   HCT 41.4 12/06/2020   MCV 82 12/06/2020   PLT 388 12/06/2020      Component Value Date/Time    NA 141 12/06/2020 1004   NA 141 07/28/2013 0410   K 4.4 12/06/2020 1004   K 3.7 07/28/2013 0410   CL 103 12/06/2020 1004   CL 103 07/28/2013 0410   CO2 24 12/06/2020 1004   CO2 29 07/28/2013 0410   GLUCOSE 76 12/06/2020 1004   GLUCOSE 98 09/08/2016 1855   GLUCOSE 121 (H) 07/28/2013 0410   BUN 14 12/06/2020 1004   BUN 16 07/28/2013 0410   CREATININE 0.75 12/06/2020 1004   CREATININE 0.66 07/28/2013 0410   CALCIUM 9.1 12/06/2020 1004   CALCIUM 8.6 07/28/2013 0410   PROT 6.7 12/06/2020 1004   PROT 6.7 07/25/2013 0410   ALBUMIN 4.7 12/06/2020 1004   ALBUMIN 3.3 (L) 07/25/2013 0410   AST 11 12/06/2020 1004   AST 20 07/25/2013 0410   ALT 10 12/06/2020 1004   ALT 18 07/25/2013 0410   ALKPHOS 127 (H) 12/06/2020 1004   ALKPHOS 49 07/25/2013 0410   BILITOT <0.2 12/06/2020 1004   BILITOT 0.4 07/25/2013 0410   GFRNONAA 93 12/06/2019 0955   GFRNONAA >60 07/28/2013 0410   GFRAA 108 12/06/2019 0955   GFRAA >60 07/28/2013 0410        ASSESSMENT AND PLAN  Multiple sclerosis (HCC)  Insomnia  due to mental disorder - Plan: traZODone (DESYREL) 100 MG tablet  Attention deficit disorder, unspecified hyperactivity presence  Gait disturbance  Depression with anxiety  High risk medication use   1.   Continue Ocrevus.  Labs were fine at PCP.   Will check IgG and IgM next visit.    2.   PSG did not show any OSA.  She had a good sleep efficiency but there was very little stage N3 sleep.  I will have her switch from trazodone to low-dose of Flexeril at night to see if that helps her feel more refreshed with her sleep 3.   Stay active and exercise as tolerated. 4.   Continue Adderall but change to 20 mg po qAM and qnoon. 5.   Would like to try Contrave for weight loss --- currently on Wellbutrin so will stop.   Return in 6 months or sooner if there are new or worsening neurologic symptoms.  Esraa Seres A. Felecia Shelling, MD, [facility] 90/24/0973, 5:32 PM Certified in Neurology, Clinical  Neurophysiology, Sleep Medicine and Neuroimaging  [facility] [address] [place], [place] [address] [phone] olll

## 2020-12-20 ENCOUNTER — Telehealth: Payer: Self-pay | Admitting: *Deleted

## 2020-12-20 NOTE — Telephone Encounter (Signed)
Submitted PA on Contrave. Key: [name]. Waiting on determination from [facility].  Preferred drugs: Glori Bickers

## 2020-12-21 ENCOUNTER — Encounter: Payer: Self-pay | Admitting: Neurology

## 2020-12-21 ENCOUNTER — Emergency Department: Payer: BC Managed Care – PPO

## 2020-12-21 ENCOUNTER — Emergency Department
Admission: EM | Admit: 2020-12-21 | Discharge: 2020-12-21 | Disposition: A | Discharge status: Home / Self Care | Payer: BC Managed Care – PPO | Attending: Emergency Medicine | Admitting: Emergency Medicine

## 2020-12-21 ENCOUNTER — Ambulatory Visit: Payer: Self-pay

## 2020-12-21 ENCOUNTER — Other Ambulatory Visit: Payer: Self-pay

## 2020-12-21 DIAGNOSIS — K209 Esophagitis, unspecified without bleeding: Secondary | ICD-10-CM | POA: Diagnosis not present

## 2020-12-21 DIAGNOSIS — Z79899 Other long term (current) drug therapy: Secondary | ICD-10-CM | POA: Insufficient documentation

## 2020-12-21 DIAGNOSIS — R0789 Other chest pain: Secondary | ICD-10-CM

## 2020-12-21 LAB — BASIC METABOLIC PANEL
Anion gap: 6 (ref 5–15)
BUN: 16 mg/dL (ref 6–20)
CO2: 26 mmol/L (ref 22–32)
Calcium: 8.8 mg/dL — ABNORMAL LOW (ref 8.9–10.3)
Chloride: 103 mmol/L (ref 98–111)
Creatinine, Ser: 0.62 mg/dL (ref 0.44–1.00)
GFR, Estimated: >60 mL/min (ref >60)
Glucose, Bld: 102 mg/dL — ABNORMAL HIGH (ref 70–99)
Potassium: 3.6 mmol/L (ref 3.5–5.1)
Sodium: 135 mmol/L (ref 135–145)

## 2020-12-21 LAB — T4, FREE: Free T4: 0.74 ng/dL (ref 0.61–1.12)

## 2020-12-21 LAB — TROPONIN I (HIGH SENSITIVITY)
Troponin I (High Sensitivity): <2 ng/L (ref <18)
Troponin I (High Sensitivity): 2 ng/L (ref <18)

## 2020-12-21 LAB — CBC
HCT: 42 % (ref 36.0–46.0)
Hemoglobin: 13.9 g/dL (ref 12.0–15.0)
MCH: 27.5 pg (ref 26.0–34.0)
MCHC: 33.1 g/dL (ref 30.0–36.0)
MCV: 83 fL (ref 80.0–100.0)
Platelets: 425 10*3/uL — ABNORMAL HIGH (ref 150–400)
RBC: 5.06 MIL/uL (ref 3.87–5.11)
RDW: 13.4 % (ref 11.5–15.5)
WBC: 9.8 10*3/uL (ref 4.0–10.5)
nRBC: 0 % (ref 0.0–0.2)

## 2020-12-21 LAB — TSH: TSH: 1.555 u[IU]/mL (ref 0.350–4.500)

## 2020-12-21 MED ORDER — ALUM & MAG HYDROXIDE-SIMETH 200-200-20 MG/5ML PO SUSP
15.0000 mL | Freq: Once | ORAL | Status: AC
  Administered 2020-12-21: 12:00:00 15 mL via ORAL
  Filled 2020-12-21: qty 30

## 2020-12-21 NOTE — Telephone Encounter (Signed)
Tried calling pt, mail box full and unable to leave message.

## 2020-12-21 NOTE — Telephone Encounter (Signed)
Mychart sent to pt as well

## 2020-12-21 NOTE — ED Triage Notes (Signed)
Pt here with CP that started around 0800. Pt states that pain is centered and does not radiate. Pt states that it hurts for her to breathe in. Pt in NAD in triage.

## 2020-12-21 NOTE — Telephone Encounter (Signed)
Pt. Started having chest pain and "burning sensation" to middle of chest at breastbone and below throat. Has been constant.Pain 7/10. Hurts to breath. " I don't know if I'm short of breath or just panicking a little." Will have her husband take her to ED now. Instructed to call 911 for worsening of symptoms.   Reason for Disposition  Difficulty breathing  Answer Assessment - Initial Assessment Questions 1. LOCATION: "Where does it hurt?"       Middle near throat 2. RADIATION: "Does the pain go anywhere else?" (e.g., into neck, jaw, arms, back)     No 3. ONSET: "When did the chest pain begin?" (Minutes, hours or days)      This morning 4. PATTERN "Does the pain come and go, or has it been constant since it started?"  "Does it get worse with exertion?"      Constant 5. DURATION: "How long does it last" (e.g., seconds, minutes, hours)     1-2 hours 6. SEVERITY: "How bad is the pain?"  (e.g., Scale 1-10; mild, moderate, or severe)    - MILD (1-3): doesn't interfere with normal activities     - MODERATE (4-7): interferes with normal activities or awakens from sleep    - SEVERE (8-10): excruciating pain, unable to do any normal activities       7 7. CARDIAC RISK FACTORS: "Do you have any history of heart problems or risk factors for heart disease?" (e.g., angina, prior heart attack; diabetes, high blood pressure, high cholesterol, smoker, or strong family history of heart disease)     No 8. PULMONARY RISK FACTORS: "Do you have any history of lung disease?"  (e.g., blood clots in lung, asthma, emphysema, birth control pills)     No 9. CAUSE: "What do you think is causing the chest pain?"     Unsure 10. OTHER SYMPTOMS: "Do you have any other symptoms?" (e.g., dizziness, nausea, vomiting, sweating, fever, difficulty breathing, cough)       Hurts to breath 11. PREGNANCY: "Is there any chance you are pregnant?" "When was your last menstrual period?"       No  Protocols used: Chest  Pain-A-AH

## 2020-12-21 NOTE — ED Provider Notes (Signed)
[facility] Emergency Department Provider Note ____________________________________________   Event Date/Time   First MD Initiated Contact with Patient 12/21/20 1112     (approximate)  I have reviewed the triage vital signs and the nursing notes.   HISTORY  Chief Complaint Chest Pain   HPI [name] is a 40 y.o. female history of MS  This morning sometime around 8 or so in the morning (2 because of school she laid down briefly and felt a burning and discomfort few at the front lower portion of her neck.  No shortness of breath.  It does not radiate.  She had a recent cough cold sinus type infection and has been treated with prednisone and antibiotic a week ago.  Her symptoms from that are improving and overall that has improved.  Denies itching or hives.  No redness except she reports has been rubbing the area causing her to look well right over the lower neck.  She took a Pepcid at home.  She reports the symptoms are actually improving and better now  Past Medical History:  Diagnosis Date   Depression    Migraine    MS (multiple sclerosis) ([place])    Multiple sclerosis ([place])    Spinal stenosis     Patient Active Problem List   Diagnosis Date Noted   Acute cough 12/06/2020   Elevated low density lipoprotein (LDL) cholesterol level 06/07/2020   Excessive daytime sleepiness 02/28/2020   Vitamin D deficiency 12/06/2019   Family history of breast cancer 12/06/2019   Migraine without aura and without status migrainosus, not intractable 08/25/2019   Obesity 06/04/2019   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 Authorizing Provider  albuterol (VENTOLIN HFA) 108 (90 Base) MCG/ACT inhaler TAKE 2 PUFFS BY MOUTH EVERY 6 HOURS AS NEEDED FOR WHEEZE OR SHORTNESS OF BREATH 03/09/20   Marnee Guarneri T, NP  amphetamine-dextroamphetamine (ADDERALL) 20 MG tablet Take one po qAM and one po q mid-day 12/18/20   Sater, Nanine Means, MD  baclofen (LIORESAL) 10 MG tablet TAKE 1 TABLET BY MOUTH FOUR TIMES A DAY AS NEEDED FOR MUSCLE SPASMS 03/01/20   Sater, Nanine Means, MD  benzonatate (TESSALON) 100 MG capsule Take 1 capsule (100 mg total) by mouth 2 (two) times daily as needed for cough. 12/06/20   Marnee Guarneri T, NP  Cholecalciferol 25 MCG (1000 UT) tablet Take 5,000 Units by mouth daily.     [provider]  fluticasone (FLONASE) 50 MCG/ACT nasal spray Place 2 sprays into both nostrils daily. 11/21/20   Cannady, Henrine Screws T, NP  ibuprofen (ADVIL) 800 MG tablet Take 1 tablet (800 mg total) by mouth 3 (three) times daily. 02/28/19   Wieters, Hallie C, PA-C  Naltrexone-buPROPion HCl ER (CONTRAVE) 8-90 MG TB12 One po bid 12/18/20   Sater, Nanine Means, MD  ocrelizumab 600 mg in sodium chloride 0.9 % 500 mL Inject 600 mg into the vein every 6 (six) months.    [provider]  propranolol (INDERAL) 10 MG tablet TAKE 1 TABLET (10 MG TOTAL) BY MOUTH DAILY AS NEEDED. FOR SEVERE ANXIETY ATTACK 04/30/19   Ursula Alert, MD  sertraline (ZOLOFT) 100 MG tablet Take 2 tablets (200 mg total) by mouth daily. 12/06/20   Cannady, Henrine Screws T, NP  topiramate (TOPAMAX) 100 MG tablet TAKE 1 TABLET BY MOUTH EVERYDAY AT BEDTIME 08/25/19   Sater, Nanine Means, MD  traZODone (DESYREL) 100 MG tablet Take 1 tablet (100 mg total) by mouth at bedtime as needed for sleep. 12/18/20 12/13/21  Sater, Nanine Means, MD    Allergies Flexeril  [cyclobenzaprine]  Family History  Problem Relation Age of Onset   Multiple myeloma Mother    Multiple sclerosis Mother    Obesity Sister    Anxiety disorder Daughter    Multiple sclerosis Maternal Aunt     Social History Social History   Tobacco Use   Smoking status: Never   Smokeless tobacco: Never  Vaping Use   Vaping Use: Never used  Substance Use Topics   Alcohol use: Not Currently    Alcohol/week: 0.0 standard drinks    Comment: occasional/fim   Drug use: No    Review of Systems Constitutional: No fever/chills except for about 2 weeks ago when she had bronchitis and a head cold which is improving Eyes: No visual changes. ENT: No sore throat.  No difficulty swallowing except for her baseline sometimes she has to be very cognizant of swallowing with her MS but reports that is normal for her no changes.  Cardiovascular: Denies chest pain, rather reports discomfort or of burning feeling in the front of her very in the lower neck upper chest region.  No radiating chest pain no crushing or heavy pain. Respiratory: Denies shortness of breath. Gastrointestinal: No abdominal pain.  No nausea vomiting Genitourinary: Denies pregnancy, reports that no chance of pregnancy Musculoskeletal: Negative for back pain. Skin: Negative for rash just slightly red over her neck where she has been rubbing it. Neurological: Negative for headaches, areas of focal weakness or numbness.  Does have some gait issues which are chronic secondary to MS but no changes.  Her MS seems stable and recently saw her neurologist    ____________________________________________   PHYSICAL EXAM:  VITAL SIGNS: ED Triage Vitals [12/21/20 0937]  Enc Vitals Group     BP 129/83     Pulse Rate 81     Resp 18     Temp 98 F (36.7 C)     Temp Source Oral     SpO2 100 %     Weight 207 lb (93.9 kg)     Height 5' 2"  (1.575 m)     Head Circumference      Peak Flow      Pain Score 6     Pain Loc      Pain  Edu?      Excl. in [place]?     Constitutional: Alert and oriented. Well appearing and in no acute distress.  She and her husband both very pleasant. Eyes: Conjunctivae are normal. Head: Atraumatic. Nose: No congestion/rhinnorhea. Mouth/Throat: Mucous membranes are moist.  Tonsils normal.  Posterior oropharynx normal.  Tongue normal.  No masses or edema.  Clear speech and no stridor Neck: No stridor.  The neck appears normal in size shape without mass.  She reports slight tenderness over the region that would correspond with the thyroid, but there is no swelling or noted nodule.  She has no difficulty swallowing.  I do not feel any palpable mass of the thyroid, however she does report this to be the area that feels somewhat sore" burning Cardiovascular: Normal rate, regular rhythm. Grossly normal heart sounds.  Good peripheral circulation. Respiratory: Normal respiratory effort.  No retractions. Lungs CTAB. Gastrointestinal: Soft and nontender. No distention. Musculoskeletal: No lower extremity tenderness nor edema.  Walks with a slightly antalgic gait, husband assists her in walking she reports this is normal Neurologic:  Normal speech and language. No gross focal neurologic deficits are appreciated.  Skin:  Skin is warm, dry and intact. No rash noted. Psychiatric: Mood and affect are normal. Speech and behavior are normal.  ____________________________________________   LABS (all labs ordered are listed, but only abnormal results are displayed)  Labs Reviewed  BASIC METABOLIC PANEL - Abnormal; Notable for the following components:      Result Value   Glucose, Bld 102 (*)    Calcium 8.8 (*)    All other components within normal limits  CBC - Abnormal; Notable for the following components:   Platelets 425 (*)    All other components within normal limits  TSH  T4, FREE  TROPONIN I (HIGH SENSITIVITY)  TROPONIN I (HIGH SENSITIVITY)    ____________________________________________  EKG  ED ECG REPORT I, Delman Kitten, the attending physician, personally viewed and interpreted this ECG.  Date: 12/21/2020 EKG Time: 945 Rate: 85 Rhythm: normal sinus rhythm QRS Axis: normal Intervals: normal ST/T Wave abnormalities: normal Narrative Interpretation: no evidence of acute ischemia  ____________________________________________  RADIOLOGY  DG Chest 2 View  Result Date: 12/21/2020 CLINICAL DATA:  Chest pain EXAM: CHEST - 2 VIEW COMPARISON:  Chest x-ray dated January 23, 2018 FINDINGS: The heart size and mediastinal contours are within normal limits. Both lungs are clear. The visualized skeletal structures are unremarkable. IMPRESSION: No active cardiopulmonary disease. Electronically Signed   By: Yetta Glassman M.D.   On: 12/21/2020 10:04    Chest x-ray reviewed negative for acute ____________________________________________   PROCEDURES  Procedure(s) performed: None  Procedures  Critical Care performed: No  ____________________________________________   INITIAL IMPRESSION / ASSESSMENT AND PLAN / ED COURSE  Pertinent labs & imaging results that were available during my care of the patient were reviewed by me and considered in my medical decision making (see chart for details).   Labs including CBC metabolic panel and initial troponin are normal, just slightly elevated platelet count  Symptoms very atypical of ACS.  No signs or symptoms of suggest PE DVT dissection pneumothorax.  No infectious symptoms noted other than her recent bronchitis which seems to be improving there is no evidence of infiltrate or pneumonia.  Overall MS symptoms stable.  No known cardiac disease.  Given her discomfort and location of pain the burning nature potentially could represent reflux, esophagitis, or perhaps viral thyroiditis etc.  No compelling evidence that would support obvious acute thyroiditis.  We will send TSH and free  T4.    HEAR Score: 1   Trop #2 normal     Pulmonary Embolism Rule-out Criteria (PERC rule)                        If YES to ANY of the following, the PERC rule is not satisfied and cannot be used to rule out PE in this patient (consider d-dimer or imaging depending on pre-test probability).  If NO to ALL of the following, AND the clinician's pre-test probability is <15%, the [facility] rule is satisfied and there is no need for further workup (including no need to obtain a d-dimer) as the post-test probability of pulmonary embolism is <2%.                      Mnemonic is HAD CLOTS   H - hormone use (exogenous estrogen)      No. A - age > 50                                                 No. D - DVT/PE history                                      No.   C - coughing blood (hemoptysis)                 No. L - leg swelling, unilateral                             No. O - O2 Sat on Room Air < 95%                  No. T - tachycardia (HR ? 100)                         No. S - surgery or trauma, recent                      No.   Based on my evaluation of the patient, including application of this decision instrument, further testing to evaluate for pulmonary embolism is not indicated at this time. I have discussed this recommendation with the patient who states understanding and agreement with this plan.  ----------------------------------------- 1:28 PM on 12/21/2020 ----------------------------------------- Patient feels improved.  Resting comfortably.  Comfortable with plan to follow-up with her primary care doctor and also recommended she may wish to seek follow-up with a gastroenterologist theses symptoms persist.  We reviewed very careful return precautions which she is very agreeable with.  Her work-up very reassuring to this point.  She does report that she has a slight sense of discomfort and burning when she swallows now in that area but seems to be improving.   Suspect she may have some sort of element of esophagitis like symptomatology and we did discuss potential for GI follow-up and careful monitoring of symptoms and return precautions.  Vitals:   12/21/20 0937 12/21/20 1141  BP: 129/83 117/65  Pulse: 81 85  Resp: 18 18  Temp: 98 F (36.7 C)   SpO2: 100% 100%     Return precautions and treatment recommendations and follow-up discussed with the patient who is agreeable with the plan.   ____________________________________________   FINAL CLINICAL IMPRESSION(S) / ED DIAGNOSES  Final diagnoses:  Atypical chest pain  Esophagitis        Note:  This document was prepared using Dragon voice recognition software and may include unintentional dictation errors       Delman Kitten, MD 12/21/20 1329

## 2021-01-23 ENCOUNTER — Other Ambulatory Visit: Payer: Self-pay | Admitting: *Deleted

## 2021-01-23 ENCOUNTER — Encounter: Payer: Self-pay | Admitting: Neurology

## 2021-01-23 DIAGNOSIS — Z6837 Body mass index (BMI) 37.0-37.9, adult: Secondary | ICD-10-CM

## 2021-02-09 ENCOUNTER — Encounter: Payer: Self-pay | Admitting: Nurse Practitioner

## 2021-02-22 ENCOUNTER — Other Ambulatory Visit: Payer: Self-pay | Admitting: Neurology

## 2021-02-22 ENCOUNTER — Other Ambulatory Visit: Payer: Self-pay | Admitting: Nurse Practitioner

## 2021-02-22 NOTE — Telephone Encounter (Signed)
Requested Prescriptions  Pending Prescriptions Disp Refills   fluticasone (FLONASE) 50 MCG/ACT nasal spray [Pharmacy Med Name: FLUTICASONE PROP 50 MCG SPRAY] 16 mL 2    Sig: SPRAY 2 SPRAYS INTO EACH NOSTRIL EVERY DAY     Ear, Nose, and Throat: Nasal Preparations - Corticosteroids Passed - 02/22/2021 10:33 AM      Passed - Valid encounter within last 12 months    Recent Outpatient Visits          2 months ago Multiple sclerosis (HCC)   [facility] Cannady, Corrie Dandy T, NP   8 months ago Multiple sclerosis (HCC)   [facility] [place], [place] T, NP   1 year ago Sinus pressure   [facility] [place], [place] T, NP   1 year ago Acute pain of left shoulder   [facility] [place], Corrie Dandy T, NP   1 year ago Encounter for annual physical exam   [facility] [place], [name], NP      Future Appointments            In 3 months Cannady, [name], NP [facility]

## 2021-02-26 ENCOUNTER — Encounter: Payer: Self-pay | Admitting: *Deleted

## 2021-03-05 ENCOUNTER — Other Ambulatory Visit: Payer: Self-pay | Admitting: Neurology

## 2021-03-06 ENCOUNTER — Encounter: Payer: Self-pay | Admitting: Neurology

## 2021-03-06 MED ORDER — AMPHETAMINE-DEXTROAMPHETAMINE 20 MG PO TABS: ORAL_TABLET | ORAL | 0 refills | Status: DC

## 2021-03-06 NOTE — Telephone Encounter (Signed)
Received refill request for Adderall.  Last OV was on 12/18/20.  Next OV is scheduled for 06/18/21 .  Last RX was written on 01/30/21 for 30 tabs.   [place] [facility] Database has been reviewed.

## 2021-03-07 ENCOUNTER — Telehealth: Payer: Self-pay | Admitting: Neurology

## 2021-03-07 NOTE — Telephone Encounter (Signed)
Completed PA for the pt on [employer] [id number] Will await determination

## 2021-03-08 NOTE — Telephone Encounter (Signed)
PA approved for the pt 03/07/2021-03/07/2024 through [facility] [employer]

## 2021-03-30 ENCOUNTER — Encounter: Payer: Self-pay | Admitting: Neurology

## 2021-04-13 ENCOUNTER — Encounter: Payer: Self-pay | Admitting: Nurse Practitioner

## 2021-04-13 ENCOUNTER — Encounter: Payer: Self-pay | Admitting: Neurology

## 2021-04-26 ENCOUNTER — Encounter: Payer: Self-pay | Admitting: Neurology

## 2021-04-27 NOTE — Telephone Encounter (Signed)
Already done pt has had multiple appt with Dr Elna Breslow since this message. ?

## 2021-04-30 ENCOUNTER — Other Ambulatory Visit: Payer: Self-pay | Admitting: Neurology

## 2021-04-30 MED ORDER — METHYLPHENIDATE HCL 20 MG PO TABS: 20.0000 mg | ORAL_TABLET | Freq: Two times a day (BID) | ORAL | 0 refills | Status: DC

## 2021-05-05 ENCOUNTER — Other Ambulatory Visit: Payer: Self-pay | Admitting: Psychiatry

## 2021-05-05 DIAGNOSIS — F0632 Mood disorder due to known physiological condition with major depressive-like episode: Secondary | ICD-10-CM

## 2021-05-07 ENCOUNTER — Telehealth: Payer: Self-pay | Admitting: *Deleted

## 2021-05-07 NOTE — Telephone Encounter (Signed)
Submitted PA methylphenidate on CMM. Key: [id number]. Waiting on determination from [employer]. ?

## 2021-05-08 NOTE — Telephone Encounter (Signed)
PA approved 05/07/2021 - 05/07/2024. PA# [place] [phone] Non-Grandfathered [id number].  ?

## 2021-05-27 ENCOUNTER — Other Ambulatory Visit: Payer: Self-pay | Admitting: Nurse Practitioner

## 2021-05-28 NOTE — Telephone Encounter (Signed)
Requested medication (s) are due for refill today: yes ? ?Requested medication (s) are on the active medication list: yes   ? ?Last refill: 02/22/21  64ml 2 refills ? ?Future visit scheduled yes 06/06/21 ? ?Notes to clinic:  Not delegated, please review. ? ?Requested Prescriptions  ?Pending Prescriptions Disp Refills  ? fluticasone (FLONASE) 50 MCG/ACT nasal spray [Pharmacy Med Name: FLUTICASONE PROP 50 MCG SPRAY] 16 mL 2  ?  Sig: SPRAY 2 SPRAYS INTO EACH NOSTRIL EVERY DAY  ?  ? Not Delegated - Ear, Nose, and Throat: Nasal Preparations - Corticosteroids Failed - 05/27/2021 10:45 AM  ?  ?  Failed - This refill cannot be delegated  ?  ?  Passed - Valid encounter within last 12 months  ?  Recent Outpatient Visits   ? ?      ? 5 months ago Multiple sclerosis (HCC)  ? [facility] [place], [place] T, NP  ? 11 months ago Multiple sclerosis (HCC)  ? [facility] [place], Corrie Dandy T, NP  ? 1 year ago Sinus pressure  ? [facility] [place], [place] T, NP  ? 1 year ago Acute pain of left shoulder  ? [facility] [place], Corrie Dandy T, NP  ? 1 year ago Encounter for annual physical exam  ? [facility] [place], [name], NP  ? ?  ?  ?Future Appointments   ? ?        ? In 1 week Cannady, [name], NP [facility]  ? ?  ? ? ?  ?  ?  ? ? ? ? ?

## 2021-05-30 ENCOUNTER — Other Ambulatory Visit: Payer: Self-pay | Admitting: Psychiatry

## 2021-05-30 DIAGNOSIS — F0632 Mood disorder due to known physiological condition with major depressive-like episode: Secondary | ICD-10-CM

## 2021-06-03 NOTE — Patient Instructions (Signed)
Multiple Sclerosis Multiple sclerosis (MS) is a disease of the brain, spinal cord, and optic nerves (central nervous system). It causes the body's disease-fighting system (immunesystem) to destroy the protective covering around nerves in the brain (myelin sheath). When this happens, signals (nerve impulses) going to and from the brain and spinal cord do not get sent properly or may not get sent at all. There are several types of MS: Relapsing-remitting MS. This is the most common type. This causes sudden attacks of symptoms. After an attack, you may recover completely until the next attack, or some symptoms may remain permanently. Secondary progressive MS. This usually develops after the onset of relapsing-remitting MS. Similar to relapsing-remitting MS, this type also causes sudden attacks of symptoms. Attacks may be less frequent, but symptoms slowly get worse over time. Primary progressive MS. This causes symptoms that steadily progress over time. This type of MS does not cause sudden attacks of symptoms. The age of onset of MS varies, but it often develops between 20 and 40 years of age. MS is a lifelong (chronic) condition. There is no cure, but treatment can help slow down the progression of the disease. What are the causes? The cause of this condition is not known. What increases the risk? You are more likely to develop this condition if: You are a woman. You have a relative with MS. However, the condition is not passed from parent to child (inherited). You have a lack (deficiency) of vitamin D. You smoke. MS is more common in the [place] than in the [place]. What are the signs or symptoms? Relapsing-remitting and secondary progressive MS cause symptoms to occur in episodes or attacks that may last weeks to months. There may be long periods between attacks in which there are almost no symptoms. Primary progressive MS causes symptoms to steadily progress after  they develop. Symptoms of MS vary because of the many different ways it affects the central nervous system. The main symptoms include: Vision problems and eye pain. Numbness and weakness. Inability to move your arms, hands, feet, or legs (paralysis). Balance problems. Shaking that you cannot control (tremors). Sudden muscle tightening (spasms). Problems with thinking (cognitive changes). MS can also cause symptoms that are associated with the disease but are not always the direct result of an MS attack. They may include: Inability to control when you urinate or have bowel movements (incontinence). Headaches. Fatigue. Inability to tolerate heat. Emotional changes. Depression. Pain. How is this diagnosed? This condition is diagnosed based on: Your symptoms. A neurological exam. This involves checking your central nervous system function, such as nerve function, reflexes, and coordination. MRIs of the brain and spinal cord. Lab tests, including a lumbar puncture that tests the fluid that surrounds the brain and spinal cord (cerebrospinal fluid). Tests to measure the electrical activity of the brain in response to stimulation (evoked potentials). How is this treated? There is no cure for MS, but medicines can help decrease the number and frequency of attacks and help relieve nuisance symptoms. Treatment options may include: Medicines that: Reduce the frequency of attacks. These medicines may be given by injection, by mouth (orally), or through an IV. Reduce inflammation (steroids). These may provide short-term relief of symptoms. Help control pain, depression, fatigue, or incontinence. Nutritional counseling. Eating a healthy, balanced diet can help with symptoms. Taking vitamin D supplements, if you have a deficiency. Using devices to help you move around (assistive devices), such as braces, a cane, or a walker. Therapy, such   as: Physical therapy to strengthen and stretch your  muscles. Occupational therapy to help you with everyday tasks. Alternative or complementary treatments such as massage or acupuncture. Low-impact, mild exercises, such as swimming, walking, and yoga. Regular exercise can help alleviate symptoms and increase strength and balance. Follow these instructions at home: Medicines Take over-the-counter and prescription medicines only as told by your health care provider. Ask your health care provider if the medicine prescribed to you requires you to avoid driving or using machinery. Activity Use assistive devices as recommended by your physical therapist or your health care provider. Exercise as directed by your health care provider. Return to your normal activities as told by your health care provider. Ask your health care provider what activities are safe for you. General instructions Eating healthy can help manage MS symptoms. Reach out for support. Share your feelings with friends, family, or a support group. Keep all follow-up visits. This is important. Where to find more information National Multiple Sclerosis Society: www.nationalmssociety.org National Institute of Neurological Disorders and Stroke: www.ninds.nih.gov National Center for Complementary and Integrative Health: www.nccih.nih.gov Contact a health care provider if: You feel depressed. You develop new pain or numbness. You have tremors. You have problems with sexual function. Get help right away if: You develop paralysis. You develop numbness. You have problems with your bladder or bowel function. You develop double vision. You lose vision in one or both eyes. You develop suicidal thoughts. You develop severe confusion. Get help right away if you feel like you may hurt yourself or others, or have thoughts about taking your own life. Go to your nearest emergency room or: Call 911. Call the National Suicide Prevention Lifeline at 1-800-273-8255 or 988. This is open 24 hours  a day. Text the Crisis Text Line at 741741. Summary Multiple sclerosis (MS) is a disease of the central nervous system that causes the body's immune system to destroy the protective covering around nerves in the brain (myelin sheath). There are 3 types of MS: relapsing-remitting, secondary progressive, and primary progressive. There is no cure for MS, but medicines can help decrease the number and frequency of attacks and help relieve nuisance symptoms. Treatment may also include physical or occupational therapy. If you develop numbness, paralysis, vision problems, or other neurological symptoms, get help right away. This information is not intended to replace advice given to you by your health care provider. Make sure you discuss any questions you have with your health care provider. Document Revised: 09/27/2020 Document Reviewed: 09/27/2020 Elsevier Patient Education  2023 Elsevier Inc.  

## 2021-06-06 ENCOUNTER — Encounter: Payer: Self-pay | Admitting: Nurse Practitioner

## 2021-06-06 ENCOUNTER — Ambulatory Visit: Payer: BC Managed Care – PPO | Admitting: Nurse Practitioner

## 2021-06-06 VITALS — BP 122/82 | HR 89 | Temp 97.8°F | Ht 62.0 in | Wt 220.0 lb

## 2021-06-06 DIAGNOSIS — G43009 Migraine without aura, not intractable, without status migrainosus: Secondary | ICD-10-CM

## 2021-06-06 DIAGNOSIS — F0632 Mood disorder due to known physiological condition with major depressive-like episode: Secondary | ICD-10-CM | POA: Diagnosis not present

## 2021-06-06 DIAGNOSIS — G35 Multiple sclerosis: Secondary | ICD-10-CM | POA: Diagnosis not present

## 2021-06-06 DIAGNOSIS — F5105 Insomnia due to other mental disorder: Secondary | ICD-10-CM

## 2021-06-06 DIAGNOSIS — F988 Other specified behavioral and emotional disorders with onset usually occurring in childhood and adolescence: Secondary | ICD-10-CM

## 2021-06-06 DIAGNOSIS — F411 Generalized anxiety disorder: Secondary | ICD-10-CM | POA: Diagnosis not present

## 2021-06-06 DIAGNOSIS — E7800 Pure hypercholesterolemia, unspecified: Secondary | ICD-10-CM | POA: Diagnosis not present

## 2021-06-06 DIAGNOSIS — E6609 Other obesity due to excess calories: Secondary | ICD-10-CM

## 2021-06-06 DIAGNOSIS — Z6836 Body mass index (BMI) 36.0-36.9, adult: Secondary | ICD-10-CM

## 2021-06-06 MED ORDER — BUPROPION HCL ER (XL) 150 MG PO TB24: 150.0000 mg | ORAL_TABLET | Freq: Every day | ORAL | 4 refills | Status: DC

## 2021-06-06 MED ORDER — PROPRANOLOL HCL 10 MG PO TABS: 10.0000 mg | ORAL_TABLET | Freq: Every day | ORAL | 1 refills | Status: DC | PRN

## 2021-06-06 MED ORDER — TOPIRAMATE 100 MG PO TABS: ORAL_TABLET | ORAL | 4 refills | Status: DC

## 2021-06-06 MED ORDER — WEGOVY 0.25 MG/0.5ML ~~LOC~~ SOAJ: 0.2500 mg | SUBCUTANEOUS | 5 refills | Status: DC

## 2021-06-06 NOTE — Assessment & Plan Note (Signed)
Chronic, ongoing, followed by psychiatry, continue current medication regimen and collaboration with them. 

## 2021-06-06 NOTE — Assessment & Plan Note (Signed)
Chronic, stable.  Denies SI/HI.  Will continue collaboration with psychiatry and current medication regimen as prescribed by them. 

## 2021-06-06 NOTE — Assessment & Plan Note (Signed)
Noted on past labs with ASCVD 0.4%, continue to monitor and diet focus with focus on modest weight loss.  Lipid panel and BMP today. ?

## 2021-06-06 NOTE — Assessment & Plan Note (Signed)
Chronic, ongoing and stable with Topamax, but has not taken in some time and having more migraines, will send in refills on this for her.  Continue collaboration with neurology and recent notes reviewed. ?

## 2021-06-06 NOTE — Assessment & Plan Note (Signed)
Chronic, ongoing -- with infusions.  Continue to collaborate with neurology and current treatment. 

## 2021-06-06 NOTE — Progress Notes (Signed)
? ?BP 122/82   Pulse 89   Temp 97.8 ?F (36.6 ?C) (Oral)   Ht 5\' 2"  (1.575 m)   Wt 220 lb (99.8 kg)   SpO2 98%   BMI 40.24 kg/m?   ? ?Subjective:  ? ? Patient ID: [name], female    DOB: [birth year], 41 y.o.   MRN: [id number] ? ?HPI: ?[name] is a 41 y.o. female ? ?Chief Complaint  ?Patient presents with  ? Migraine  ? Mood  ? Multiple Sclerosis  ? Medication Refill  ?  Patient is asking if provider can refill her Wellbutrin as she has not seen her Psych in a while and they would not refill her medication. Patient states she has been without it for 3 weeks. Patient says she can tell a difference in her mood. Patient states she has been noticing her crying a lot more and she says it was also helping with fatigue.   ? Weight Gain  ?  Patient states she would like to discuss weight gain and says she was [facility] to [facility] and there is a nine month waiting list and she has not heard back from them.  ? ?WEIGHT LOSS: ?Would like to work on weight loss with her MS and history of elevation in cholesterol levels. Tried to get Contrave in past -- but was unable to obtain.  Over past 6 months has been working on diet changes, eating healthier food options and cutting back on processed, + is exercising best she can with her MS and family time.  Has no family history of thyroid cancer or pancreatitis.  Her goal is to get to 150 lbs.  ? ?The 10-year ASCVD risk score (Arnett DK, et al., 2019) is: 0.4% ?  Values used to calculate the score: ?    Age: 33 years ?    Sex: Female ?    Is Non-Hispanic African American: No ?    Diabetic: No ?    Tobacco smoker: No ?    Systolic Blood Pressure: 122 mmHg ?    Is BP treated: No ?    HDL Cholesterol: 58 mg/dL ?    Total Cholesterol: 173 mg/dL ? ? ?MIGRAINES & MS ?Continues on Topamax and is followed by neurology for this and her MS -- last saw 12/18/20 -- has appointment upcoming. Continues on on infusions for MS -- Ocrevus -- last on 04/04/21.   Had more trouble after this infusion with getting sick + allergies have been bad.  Been using walker more then she would like.  Migraines have been worsening, but she believes she is out of Topamax -- does not recall taking.  Reviewed recent neurology note. ? ?She feels changes in weather tend to bring migraines on more. ?Headache status at time of visit: asymptomatic ?Treatments attempted: none, triptans and topamax   ?Aura: yes ?Nausea:  yes ?Vomiting: no ?Photophobia:  no ?Phonophobia:  no ?Effect on social functioning:  no ?Numbers of missed days of school/work each month: 0 ?Confusion:  no ?Gait disturbance/ataxia:  no ?Behavioral changes:  no ?Fevers:  no  ?  ?DEPRESSION ?Followed by psychiatry at [facility], [facility].  Last seen 04/14/20. Patient also is followed by therapist, twice a month. Continues on Sertraline, Wellbutrin, Ritalin (was on Adderall, but due to shortage could not get and this worked better), and Trazodone + Propranolol as needed.  Has not seen psychiatry in some time, needs refills as has been without Wellbutrin for 3 weeks. ?  Mood status: stable ?Satisfied with current treatment?: yes ?Symptom severity: mild  ?Duration of current treatment : chronic ?Side effects: no ?Medication compliance: good compliance ?Psychotherapy/counseling: yes current ?Depressed mood: yes ?Anxious mood: no ?Anhedonia: no ?Significant weight loss or gain: no ?Insomnia: no hard to fall asleep ?Fatigue: no ?Feelings of worthlessness or guilt: no ?Impaired concentration/indecisiveness: no ?Suicidal ideations: no ?Hopelessness: no ?Crying spells: no ? ?  06/06/2021  ?  9:05 AM 12/06/2020  ? 10:06 AM 12/06/2020  ?  9:31 AM 06/07/2020  ? 10:04 AM 12/06/2019  ?  9:56 AM  ?Depression screen PHQ 2/9  ?Decreased Interest 1 1 1 1 1   ?Down, Depressed, Hopeless 1 0 1 0 1  ?PHQ - 2 Score 2 1 2 1 2   ?Altered sleeping 1 2 2  0 2  ?Tired, decreased energy 2 1 3 3 2   ?Change in appetite 2 1 3 1 2   ?Feeling bad or failure about yourself  1 1 0  0 0  ?Trouble concentrating 2 0 1 1 1   ?Moving slowly or fidgety/restless 1 2 0 0 0  ?Suicidal thoughts 0 0 0 0 0  ?PHQ-9 Score 11 8 11 6 9   ?Difficult doing work/chores  Not difficult at all Somewhat difficult Not difficult at all Somewhat difficult  ? ? ?  06/06/2021  ?  9:05 AM 12/06/2020  ? 10:06 AM 12/06/2020  ?  9:31 AM 06/07/2020  ?  9:52 AM  ?GAD 7 : Generalized Anxiety Score  ?Nervous, Anxious, on Edge 2 1 1  0  ?Control/stop worrying 1 0 1 1  ?Worry too much - different things 1 2 1 1   ?Trouble relaxing 0 2 1 1   ?Restless 0 2 1 1   ?Easily annoyed or irritable 2 2 2 2   ?Afraid - awful might happen 0 0 1 0  ?Total GAD 7 Score 6 9 8 6   ?Anxiety Difficulty Somewhat difficult Somewhat difficult Somewhat difficult   ? ?Relevant past medical, surgical, family and social history reviewed and updated as indicated. Interim medical history since our last visit reviewed. ?Allergies and medications reviewed and updated. ? ?Review of Systems  ?Constitutional:  Negative for activity change, appetite change, diaphoresis, fatigue and fever.  ?Respiratory:  Negative for cough, chest tightness and shortness of breath.   ?Cardiovascular:  Negative for chest pain, palpitations and leg swelling.  ?Gastrointestinal: Negative.   ?Neurological: Negative.   ?Psychiatric/Behavioral: Negative.    ? ?Per HPI unless specifically indicated above ? ?   ?Objective:  ?  ?BP 122/82   Pulse 89   Temp 97.8 ?F (36.6 ?C) (Oral)   Ht 5\' 2"  (1.575 m)   Wt 220 lb (99.8 kg)   SpO2 98%   BMI 40.24 kg/m?   ?Wt Readings from Last 3 Encounters:  ?06/06/21 220 lb (99.8 kg)  ?12/21/20 207 lb (93.9 kg)  ?12/18/20 206 lb (93.4 kg)  ?  ?Physical Exam ?Vitals and nursing note reviewed.  ?Constitutional:   ?   General: She is awake. She is not in acute distress. ?   Appearance: She is well-developed. She is obese. She is not ill-appearing.  ?HENT:  ?   Head: Normocephalic.  ?   Right Ear: Hearing normal.  ?   Left Ear: Hearing normal.  ?   Nose: Nose normal.   ?Eyes:  ?   General: Lids are normal.     ?   Right eye: No discharge.     ?   Left eye: No  discharge.  ?   Conjunctiva/sclera: Conjunctivae normal.  ?   Pupils: Pupils are equal, round, and reactive to light.  ?Cardiovascular:  ?   Rate and Rhythm: Normal rate and regular rhythm.  ?   Heart sounds: Normal heart sounds. No murmur heard. ?  No gallop.  ?Pulmonary:  ?   Effort: Pulmonary effort is normal. No accessory muscle usage or respiratory distress.  ?   Breath sounds: Normal breath sounds.  ?Abdominal:  ?   General: Bowel sounds are normal.  ?   Palpations: Abdomen is soft.  ?Musculoskeletal:  ?   Cervical back: Normal range of motion and neck supple.  ?   Right lower leg: No edema.  ?   Left lower leg: No edema.  ?Skin: ?   General: Skin is warm and dry.  ?Neurological:  ?   Mental Status: She is alert and oriented to person, place, and time.  ?Psychiatric:     ?   Attention and Perception: Attention normal.     ?   Mood and Affect: Mood normal.     ?   Behavior: Behavior normal. Behavior is cooperative.     ?   Thought Content: Thought content normal.     ?   Judgment: Judgment normal.  ? ?Results for orders placed or performed during the hospital encounter of 12/21/20  ?Basic metabolic panel  ?Result Value Ref Range  ? Sodium 135 135 - 145 mmol/L  ? Potassium 3.6 3.5 - 5.1 mmol/L  ? Chloride 103 98 - 111 mmol/L  ? CO2 26 22 - 32 mmol/L  ? Glucose, Bld 102 (H) 70 - 99 mg/dL  ? BUN 16 6 - 20 mg/dL  ? Creatinine, Ser 0.62 0.44 - 1.00 mg/dL  ? Calcium 8.8 (L) 8.9 - 10.3 mg/dL  ? GFR, Estimated >60 >60 mL/min  ? Anion gap 6 5 - 15  ?CBC  ?Result Value Ref Range  ? WBC 9.8 4.0 - 10.5 K/uL  ? RBC 5.06 3.87 - 5.11 MIL/uL  ? Hemoglobin 13.9 12.0 - 15.0 g/dL  ? HCT 42.0 36.0 - 46.0 %  ? MCV 83.0 80.0 - 100.0 fL  ? MCH 27.5 26.0 - 34.0 pg  ? MCHC 33.1 30.0 - 36.0 g/dL  ? RDW 13.4 11.5 - 15.5 %  ? Platelets 425 (H) 150 - 400 K/uL  ? nRBC 0.0 0.0 - 0.2 %  ?TSH  ?Result Value Ref Range  ? TSH 1.555 0.350 - 4.500 uIU/mL   ?T4, free  ?Result Value Ref Range  ? Free T4 0.74 0.61 - 1.12 ng/dL  ?Troponin I (High Sensitivity)  ?Result Value Ref Range  ? Troponin I (High Sensitivity) <2 <18 ng/L  ?Troponin I (High Sensitivity)  ?Result

## 2021-06-06 NOTE — Assessment & Plan Note (Signed)
BMI 40.24 with weight trending up 13 pounds since last visit.  She is interested in weight loss and has tried over past 6 months to lose without success.  Currently with elevation in cholesterol levels and MS, weight loss would be beneficial.  Discussed weight loss treatment options with her at length today.  She has not family history of thyroid cancer or pancreatitis.  Would like to try [facility].  Will send in Wegovy 0.25 MG weekly and work on coverage for this -- educated her at length on medication + side effects.  Discussed medications and she will check with insurance coverage.  Recommended eating smaller high protein, low fat meals more frequently and exercising 30 mins a day 5 times a week with a goal of 10-15lb weight loss in the next 3 months. Patient voiced their understanding and motivation to adhere to these recommendations. ? ?

## 2021-06-06 NOTE — Assessment & Plan Note (Signed)
Chronic, ongoing secondary to MS.  Continue Trazodone and collaboration with psychiatry and therapy.   

## 2021-06-06 NOTE — Assessment & Plan Note (Signed)
Chronic, stable.  Denies SI/HI.  Followed by psychiatry, last note reviewed.  Continue current medication regimen as prescribed by them, will send in refills as she has not been to see them in several months.  Continue therapy sessions. ?

## 2021-06-07 LAB — HEMOGLOBIN A1C
Est. average glucose Bld gHb Est-mCnc: 114 mg/dL
Hgb A1c MFr Bld: 5.6 % (ref 4.8–5.6)

## 2021-06-07 LAB — BASIC METABOLIC PANEL
BUN/Creatinine Ratio: 12 (ref 9–23)
BUN: 9 mg/dL (ref 6–24)
CO2: 25 mmol/L (ref 20–29)
Calcium: 9.3 mg/dL (ref 8.7–10.2)
Chloride: 103 mmol/L (ref 96–106)
Creatinine, Ser: 0.78 mg/dL (ref 0.57–1.00)
Glucose: 89 mg/dL (ref 70–99)
Potassium: 4.5 mmol/L (ref 3.5–5.2)
Sodium: 142 mmol/L (ref 134–144)
eGFR: 97 mL/min/{1.73_m2} (ref >59)

## 2021-06-07 LAB — LIPID PANEL W/O CHOL/HDL RATIO
Cholesterol, Total: 192 mg/dL (ref 100–199)
HDL: 58 mg/dL (ref >39)
LDL Chol Calc (NIH): 108 mg/dL — ABNORMAL HIGH (ref 0–99)
Triglycerides: 151 mg/dL — ABNORMAL HIGH (ref 0–149)
VLDL Cholesterol Cal: 26 mg/dL (ref 5–40)

## 2021-06-07 NOTE — Progress Notes (Signed)
Contacted via [place] ? ? ?Good afternoon [name], your labs have returned: ?- Kidney function, creatinine and eGFR, remains normal. ?- A1c shows no diabetes or prediabetes. ?- Your LDL is above normal. ?The LDL is the bad cholesterol. ?Over time and in combination with inflammation and other factors, this contributes to plaque which in turn may lead to stroke and/or heart attack down the road. ?Sometimes high LDL is primarily genetic, and people might be eating all the right foods but still have high numbers. ?Other times, there is room for improvement in one's diet and eating healthier can bring this number down and potentially reduce one's risk of heart attack and/or stroke.  ?? ?To reduce your LDL, Remember - more fruits and vegetables, more fish, and limit red meat and dairy products. ?More soy, nuts, beans, barley, lentils, oats and plant sterol ester enriched margarine instead of butter. ?I also encourage eliminating sugar and processed food. ?Remember, shop on the outside of the grocery store and visit your [employer]. ??If you would like to talk with me about dietary changes for your cholesterol, please let me know. We should recheck your cholesterol in 6-12 months.  Let me know if any issues getting Wegovy.  Any questions? ?Keep being amazing!!  Thank you for allowing me to participate in your care.  I appreciate you. ?Kindest regards, ?Moxie Kalil ? ?? ? ?

## 2021-06-11 ENCOUNTER — Encounter: Payer: Self-pay | Admitting: Nurse Practitioner

## 2021-06-14 ENCOUNTER — Encounter: Payer: Self-pay | Admitting: Nurse Practitioner

## 2021-06-14 NOTE — Progress Notes (Signed)
? ? ?Chief Complaint  ?Patient presents with  ? Follow-up  ?  Rm 1, alone. Here for 6 month MS f/u, on Ocrevus and tolerating well. Pt reports feeling tired and gait problems. L leg gets hot and pins/needle pn (new sx). Has been having to use walker more. Leans to the side when standing. Ritalin has not been as helpful as Adderall.   ? ? ?HISTORY OF PRESENT ILLNESS: ? ?06/18/21 ALL:  ?[name] is a 41 y.o. female here today for follow up for RRMS. She continues Ocrevus infusions every 6 months. Last infusion 04/04/2021, next 10/03/2021. Labs have been normal. MRI stable 09/2020.  ? ?She reports that she has done very well until the past month or so. She feels that she is very tired. She feels that she is having more difficulty with concentration. She feels that balance is progressively worsening over the past month. She has had more difficulty with left thigh feeling really hot and having pins and needle sensation when she is walking for longer distances. She has chronic back pain. 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 She is not followed by spine specialist. NO worsening back pain, recently. No falls. She is using her Rolator. She continues baclofen 10mg  in am and 30mg  at bedtime.  ? ?Headaches have improved now that she is back on topiramate (PCP). She restarted about a week ago and feels headaches are improving.  ? ?Methylphenidate 20mg  BID last filled 04/30/2021 for 30 days. Previously taking Adderall ER 30mg . She switched due to supply issues. She reports that she forgets to take second dose. She is in grad school for clinical mental health. She is a .  ? ?She feels mood is good. She continues sertraline 200mg  and bupropion 150mg  (PCP). She feels that she sleeps well taking trazodone 100mg  at bedtime. She reports being off bupropion for about 2 months, recently, and felt that she was more emotional. Cried more. All three antidepressants started with  psychiatry years ago. She feels that she is tolerating meds well.  ? ?She is seeing healthy weight and wellness. She recently started [facility]. She took her first injection yesterday.  ? ?She continues vitamin D 1000iu daily.  ? ? ?HISTORY (copied from Dr 05/02/2021 previous note) ? ?[name] is a 41 y.o. woman with relapsing remitting multiple sclerosis.    ?  ?Update 12/18/2020: ?She had her last OCrevus 09/20/2020.   No definite exacerbations.   MRI of the brain 09/06/2020 showed  a focus within the left middle cerebellar peduncle and a couple small foci in the periventricular white matter.  None of these enhance or appear to be acute.  Compared to the MRI dated 09/24/2017, there were no new lesions.   ?  ?At the last visit, she had noted some verbal processing and other cognitive issues.   This is still occurring but not as uch    She feels it may be associated with going back to school.     She has trouble focusing.   ?  ?She is noting more difficulty with verbal processing.   She  has some word finding errors and word substitution.     She plays piano and guitar and has some errors when she reads music.  She works as a [employer] and is now in grad school (clinical mental health) ?  ?Due to her cognitive issues and snoring, I was concerned about possibility of OSA but PSG was normal.  She did have a relative low amount of stage N3  sleep (only 4.9%).   She is on baclofen 30 mg and trazodone 100 mg at night.    ?  ?She is walking about the same with some reduced balance.   She has some some right leg weakness and spasticity.   She stumbles some.  She has a right foot drop. She also takes baclofen (10 mg in am and 30 mg at night)  for spasticity with benefit.     ?  ?Mood is doing well on Zoloft 200 mg and Wellbutrin 150 mg/d. Marland Kitchen  She also sees [facility].   She notes mild cognitive issues with reduced focus/attention, STM and processing.   She has noted more word finding diffiuculty.   She tries to stay cognitively active and instrumental in [place]..  Adderall XR in the morning with additional IR many but not all afternoons.   Due to a national shortage in Adderall XR, she has been out thos month ?  ?Migraine headaches have been well on topiramate.   She had Covid last year and again last month. The second time was mild while the first time wiped her out x weeks.   ?   ?She is overweight (BMI = 37) and had discussed Contrave with her PCP NP.   She will need to stop wellbutrin if she goes on Contrave.   ?  ?MS history:   In late June or early July 2011, she had the onset of numbness that went up to her belly button. She presented to the emergency room. An MRI of the brain showed 1 periventricular focus with maybe a second subtle periventricular focus. An MRI of the cervical spine showed an enhancing lesion adjacent to C3-C4. The diagnosis of possible MS was made but the evidence was not enough to begin a disease modifying therapy. In 2015, she had the onset of similar symptoms and also had decreased ability to use the left hand. An MRI of the brain at that time showed several new lesions not present in 2011, including a focus in the right middle cerebellar peduncle additionally, she had a lumbar puncture consistent with MS. Because of her symptoms, MRI changes and lumbar puncture results, she was diagnosed with clinical definite relapsing remitting MS and was started on Tecfidera. She has continued on Tecfidera. Over the past few months she has noted more difficulty with focus and concentration.     She also has had some change in vision more recently. Because of these newer symptoms, and MRI was repeated 04/27/2015. The MRI of the cervical spine showed no new lesions. though the MRI of the brain showed 2  lesions not present in 2015, one in the left juxtacortical white matter and one in the left deep white matter.  She had a small exacerbation in August 2017 and received IV SOlu-Medrol.   MRI  cervical spine 2017 an MRI of the brain show two spinal plaques, a definite rightt middle cerebellar peduncle plaque and a possible right pontine plaque and several foci in the hemispheres  MRI 01/2016 showed no new lesions.  She is JCV Ab positive.  She had an exacerbation in 2017 and early 2018 switch to ocrelizumab with her first doses in May 2018 (was on Gilenya).   She had another small exacerbation in July (right leg stiff/weak) and she received 3 days of IV site.  She went from using a cane to a walker for balance.  She tolerates  the ocrelizumab well.    ?  ?FH:  Her mom has MS and her MS stabilized after a bone marrow transplant for cancer. ?  ?MRI images ?MRI of the brain 09/24/2017 showed T2 hyperintense foci in the right middle cerebellar peduncle and hemispheres.  There were no changes compared to 09/08/2017. ?  ?MRI of the cervical spine 09/26/2017 showed foci within the spinal cord adjacent to C4 and C6-C7.  They were both present on an MRI from 04/04/2016.  There is mild spinal stenosis at C5-C6 due to disc protrusion. ?  ?MRI of the brain 09/06/2020 showed  a focus within the left middle cerebellar peduncle and a couple small foci in the periventricular white matter.  None of these enhance or appear to be acute.  Compared to the MRI dated 09/24/2017, there are no new lesions.  These are consistent with chronic demyelinating plaque associated with multiple sclerosis ? ? ?REVIEW OF SYSTEMS: Out of a complete 14 system review of symptoms, the patient complains only of the following symptoms, headaches, fatigue, insomnia, and all other reviewed systems are negative. ? ? ?ALLERGIES: ?Allergies  ?Allergen Reactions  ? Flexeril [Cyclobenzaprine] Other (See Comments)  ?  Drowsy when taking during the day ?  ? ? ? ?HOME MEDICATIONS: ?Outpatient Medications Prior to Visit  ?Medication Sig Dispense Refill  ? albuterol (VENTOLIN HFA) 108 (90 Base) MCG/ACT inhaler TAKE 2 PUFFS BY MOUTH EVERY 6 HOURS AS NEEDED FOR WHEEZE  OR SHORTNESS OF BREATH 18 each 0  ? baclofen (LIORESAL) 10 MG tablet TAKE 1 TABLET BY MOUTH FOUR TIMES A DAY AS NEEDED FOR MUSCLE SPASMS 360 tablet 1  ? buPROPion (WELLBUTRIN XL) 150 MG 24 hr tablet Take 1 tablet (1

## 2021-06-14 NOTE — Patient Instructions (Addendum)
Below is our plan: ? ?We will continue current treatment plan. I will update labs, today. I will place orders for PT evaluation. Continue using Rolator. I will switch you back to Adderall XR 30mg  daily as this seemed to work better. You may need to contact pharmacy a few days before refill is due to ensure it is available. May have to call other local pharmacies if not.  ? ?Please make sure you are staying well hydrated. I recommend 50-60 ounces daily. Well balanced diet and regular exercise encouraged. Consistent sleep schedule with 6-8 hours recommended.  ? ?Please continue follow up with care team as directed.  ? ?Follow up with Dr Felecia Shelling in 6 months ? ?You may receive a survey regarding today's visit. I encourage you to leave honest feed back as I do use this information to improve patient care. Thank you for seeing me today!  ? ? ?

## 2021-06-18 ENCOUNTER — Encounter: Payer: Self-pay | Admitting: Family Medicine

## 2021-06-18 ENCOUNTER — Ambulatory Visit: Payer: BC Managed Care – PPO | Admitting: Family Medicine

## 2021-06-18 VITALS — BP 114/88 | HR 86 | Ht 62.0 in | Wt 223.0 lb

## 2021-06-18 DIAGNOSIS — F988 Other specified behavioral and emotional disorders with onset usually occurring in childhood and adolescence: Secondary | ICD-10-CM

## 2021-06-18 DIAGNOSIS — F418 Other specified anxiety disorders: Secondary | ICD-10-CM

## 2021-06-18 DIAGNOSIS — F5105 Insomnia due to other mental disorder: Secondary | ICD-10-CM

## 2021-06-18 DIAGNOSIS — G35 Multiple sclerosis: Secondary | ICD-10-CM

## 2021-06-18 DIAGNOSIS — G43009 Migraine without aura, not intractable, without status migrainosus: Secondary | ICD-10-CM

## 2021-06-18 DIAGNOSIS — Z79899 Other long term (current) drug therapy: Secondary | ICD-10-CM | POA: Diagnosis not present

## 2021-06-18 DIAGNOSIS — R269 Unspecified abnormalities of gait and mobility: Secondary | ICD-10-CM | POA: Diagnosis not present

## 2021-06-18 DIAGNOSIS — G4719 Other hypersomnia: Secondary | ICD-10-CM

## 2021-06-18 DIAGNOSIS — E559 Vitamin D deficiency, unspecified: Secondary | ICD-10-CM

## 2021-06-18 MED ORDER — AMPHETAMINE-DEXTROAMPHET ER 30 MG PO CP24: 30.0000 mg | ORAL_CAPSULE | Freq: Every day | ORAL | 0 refills | Status: DC

## 2021-06-19 LAB — CBC WITH DIFFERENTIAL/PLATELET
Basophils Absolute: 0.1 10*3/uL (ref 0.0–0.2)
Basos: 1 %
EOS (ABSOLUTE): 0.3 10*3/uL (ref 0.0–0.4)
Eos: 3 %
Hematocrit: 42.7 % (ref 34.0–46.6)
Hemoglobin: 14 g/dL (ref 11.1–15.9)
Immature Grans (Abs): 0 10*3/uL (ref 0.0–0.1)
Immature Granulocytes: 0 %
Lymphocytes Absolute: 1.5 10*3/uL (ref 0.7–3.1)
Lymphs: 18 %
MCH: 27 pg (ref 26.6–33.0)
MCHC: 32.8 g/dL (ref 31.5–35.7)
MCV: 82 fL (ref 79–97)
Monocytes Absolute: 0.6 10*3/uL (ref 0.1–0.9)
Monocytes: 7 %
Neutrophils Absolute: 5.9 10*3/uL (ref 1.4–7.0)
Neutrophils: 71 %
Platelets: 433 10*3/uL (ref 150–450)
RBC: 5.18 x10E6/uL (ref 3.77–5.28)
RDW: 14.5 % (ref 11.7–15.4)
WBC: 8.3 10*3/uL (ref 3.4–10.8)

## 2021-06-19 LAB — IGG, IGA, IGM
IgA/Immunoglobulin A, Serum: 86 mg/dL — ABNORMAL LOW (ref 87–352)
IgG (Immunoglobin G), Serum: 832 mg/dL (ref 586–1602)
IgM (Immunoglobulin M), Srm: 91 mg/dL (ref 26–217)

## 2021-06-19 LAB — VITAMIN D 25 HYDROXY (VIT D DEFICIENCY, FRACTURES): Vit D, 25-Hydroxy: 39.2 ng/mL (ref 30.0–100.0)

## 2021-06-26 ENCOUNTER — Encounter: Payer: Self-pay | Admitting: Family Medicine

## 2021-06-28 NOTE — Telephone Encounter (Signed)
Spoke with patient regarding form fee, she asked for call back on 6/1 to make payment

## 2021-07-09 DIAGNOSIS — Z0289 Encounter for other administrative examinations: Secondary | ICD-10-CM

## 2021-07-10 ENCOUNTER — Other Ambulatory Visit: Payer: Self-pay | Admitting: Nurse Practitioner

## 2021-07-10 NOTE — Telephone Encounter (Signed)
Requested Prescriptions  Pending Prescriptions Disp Refills  . sertraline (ZOLOFT) 100 MG tablet [Pharmacy Med Name: SERTRALINE HCL 100 MG TABLET] 90 tablet 2    Sig: TAKE 2 TABLETS BY MOUTH EVERY DAY     Psychiatry:  Antidepressants - SSRI - sertraline Passed - 07/10/2021  1:53 AM      Passed - AST in normal range and within 360 days    AST  Date Value Ref Range Status  12/06/2020 11 0 - 40 IU/L Final   SGOT(AST)  Date Value Ref Range Status  07/25/2013 20 15 - 37 Unit/L Final         Passed - ALT in normal range and within 360 days    ALT  Date Value Ref Range Status  12/06/2020 10 0 - 32 IU/L Final   SGPT (ALT)  Date Value Ref Range Status  07/25/2013 18 12 - 78 U/L Final         Passed - Completed PHQ-2 or PHQ-9 in the last 360 days      Passed - Valid encounter within last 6 months    Recent Outpatient Visits          1 month ago Multiple sclerosis (HCC)   [facility] Cannady, Jolene T, NP   7 months ago Multiple sclerosis (HCC)   [facility] [place], [place] T, NP   1 year ago Multiple sclerosis (HCC)   [facility] Cannady, Corrie Dandy T, NP   1 year ago Sinus pressure   [facility] [place], [place] T, NP   1 year ago Acute pain of left shoulder   [facility] [place], [name], NP      Future Appointments            In 3 weeks Cannady, [name], NP [facility]

## 2021-07-12 NOTE — Telephone Encounter (Signed)
Gave completed/signed form back to medical records to process for pt. 

## 2021-07-16 ENCOUNTER — Encounter: Payer: Self-pay | Admitting: Nurse Practitioner

## 2021-07-17 MED ORDER — SAXENDA 18 MG/3ML ~~LOC~~ SOPN: PEN_INJECTOR | SUBCUTANEOUS | 0 refills | Status: DC

## 2021-07-18 ENCOUNTER — Telehealth: Payer: Self-pay

## 2021-07-18 NOTE — Telephone Encounter (Signed)
Prior authorization was initiated via CoverMyMeds for prescription of Saxenda 18 MG/ ML. Awaiting determination.  KEY: [id number]

## 2021-07-25 ENCOUNTER — Other Ambulatory Visit: Payer: Self-pay | Admitting: Family Medicine

## 2021-07-26 MED ORDER — AMPHETAMINE-DEXTROAMPHET ER 30 MG PO CP24: 30.0000 mg | ORAL_CAPSULE | Freq: Every day | ORAL | 0 refills | Status: DC

## 2021-08-01 ENCOUNTER — Ambulatory Visit: Payer: BC Managed Care – PPO | Admitting: Nurse Practitioner

## 2021-08-02 ENCOUNTER — Encounter: Payer: Self-pay | Admitting: Nurse Practitioner

## 2021-08-02 ENCOUNTER — Ambulatory Visit (INDEPENDENT_AMBULATORY_CARE_PROVIDER_SITE_OTHER): Payer: BC Managed Care – PPO | Admitting: Nurse Practitioner

## 2021-08-02 VITALS — BP 100/70 | HR 98 | Temp 97.6°F | Ht 62.0 in | Wt 205.8 lb

## 2021-08-02 DIAGNOSIS — R399 Unspecified symptoms and signs involving the genitourinary system: Secondary | ICD-10-CM | POA: Insufficient documentation

## 2021-08-02 DIAGNOSIS — J301 Allergic rhinitis due to pollen: Secondary | ICD-10-CM | POA: Diagnosis not present

## 2021-08-02 DIAGNOSIS — E6609 Other obesity due to excess calories: Secondary | ICD-10-CM | POA: Diagnosis not present

## 2021-08-02 DIAGNOSIS — R8281 Pyuria: Secondary | ICD-10-CM

## 2021-08-02 DIAGNOSIS — Z6836 Body mass index (BMI) 36.0-36.9, adult: Secondary | ICD-10-CM

## 2021-08-02 LAB — MICROSCOPIC EXAMINATION

## 2021-08-02 LAB — URINALYSIS, ROUTINE W REFLEX MICROSCOPIC
Bilirubin, UA: NEGATIVE
Glucose, UA: NEGATIVE
Ketones, UA: NEGATIVE
Nitrite, UA: NEGATIVE
Protein,UA: NEGATIVE
Specific Gravity, UA: >1.03 — ABNORMAL HIGH (ref 1.005–1.030)
Urobilinogen, Ur: 0.2 mg/dL (ref 0.2–1.0)
pH, UA: 5.5 (ref 5.0–7.5)

## 2021-08-02 NOTE — Assessment & Plan Note (Signed)
Chronic, ongoing.  Recommend she start Xyzal or Claritin daily + continue Flonase.  Attempt to avoid triggers.

## 2021-08-02 NOTE — Progress Notes (Signed)
BP 100/70   Pulse 98   Temp 97.6 F (36.4 C) (Oral)   Ht 5\' 2"  (1.575 m)   Wt 205 lb 12.8 oz (93.4 kg)   SpO2 99%   BMI 37.64 kg/m    Subjective:    Patient ID: , female    DOB: 1981-01-24, 41 y.o.   MRN: [id number]  HPI: [name] is a 41 y.o. female  Chief Complaint  Patient presents with   Weight Check    Patient is here for Weight Check. Patient says she has not be able to afford the alternative for weight loss injectable as she says the 55 is too expensive. Patient is asking if she should just wait until around September as she has a lot going on with graduate school.    Urinary Frequency    Patient says she is concerned of a possible UTI as she is noticing some urinary frequency.    Allergic Rhinitis     Patient says her allergies have been draining and she says she notices a thick water mucus that she is coughing and coming out of her nose. Patient says the mucus color will change and she is says it is terrible. Patient says she currently uses Flonase for her allergies.    WEIGHT LOSS: Follow-up for weight check, was unable to get Wegovy due to shortages and October was too expensive.  With the [facility] sample of a month of 0.25 MG she did lose from 223 lbs to 205 lbs.  Would like to work on weight loss with her MS and history of elevation in cholesterol levels. Tried to get Contrave in past -- but was unable to obtain.  Over past 6 months has been working on diet changes, eating healthier food options and cutting back on processed, + is exercising best she can with her MS and family time.  Has no family history of thyroid cancer or pancreatitis.  Her goal is to get to 150 lbs. She is okay with waiting until September to get [facility] when back in stock.  URINARY SYMPTOMS Ongoing a "pretty good while".  A deep muscle discomfort at times when urinates.  Occasional urinary incontinence when sits up. Dysuria: yes Urinary frequency:  yes Urgency: yes Small volume voids: yes Symptom severity: yes Urinary incontinence:  occasional dribbling Foul odor: no Hematuria: no Abdominal pain: no Back pain: no Suprapubic pain/pressure: no Flank pain: no Fever:  no Vomiting: no Status: stable Previous urinary tract infection: yes Recurrent urinary tract infection: no Sexual activity: monogamous History of sexually transmitted disease: no Treatments attempted: increasing fluids    ALLERGIES Currently using Flonase only. Duration: chronic Runny nose: yes "clear Nasal congestion: yes Nasal itching: yes Sneezing: yes Eye swelling, itching or discharge: yes Post nasal drip: yes Cough: yes Sinus pressure:  a little bit   Ear pain: none Ear pressure: none Fever: none Symptoms occur seasonally: yes Symptoms occur perenially: no Satisfied with current treatment: no Allergist evaluation in past: yes Allergen injection immunotherapy: no Recurrent sinus infections: no ENT evaluation in past: no Known environmental allergy: yes Indoor pets: yes History of asthma: no Current allergy medications: Flonase Treatments attempted: Flonase   Relevant past medical, surgical, family and social history reviewed and updated as indicated. Interim medical history since our last visit reviewed. Allergies and medications reviewed and updated.  Review of Systems  Constitutional:  Negative for activity change, appetite change, diaphoresis, fatigue and fever.  HENT:  Positive for congestion, postnasal  drip, rhinorrhea and sneezing. Negative for ear discharge, ear pain, sinus pressure, sinus pain, sore throat and voice change.   Respiratory:  Positive for cough. Negative for chest tightness and shortness of breath.   Cardiovascular:  Negative for chest pain, palpitations and leg swelling.  Gastrointestinal: Negative.   Genitourinary:  Positive for dysuria, frequency and urgency. Negative for hematuria, pelvic pain and vaginal  discharge.  Neurological: Negative.   Psychiatric/Behavioral: Negative.      Per HPI unless specifically indicated above     Objective:    BP 100/70   Pulse 98   Temp 97.6 F (36.4 C) (Oral)   Ht 5\' 2"  (1.575 m)   Wt 205 lb 12.8 oz (93.4 kg)   SpO2 99%   BMI 37.64 kg/m   Wt Readings from Last 3 Encounters:  08/02/21 205 lb 12.8 oz (93.4 kg)  06/18/21 223 lb (101.2 kg)  06/06/21 220 lb (99.8 kg)    Physical Exam Vitals and nursing note reviewed.  Constitutional:      General: She is awake. She is not in acute distress.    Appearance: She is well-developed. She is obese. She is not ill-appearing.  HENT:     Head: Normocephalic.     Right Ear: Hearing, ear canal and external ear normal. A middle ear effusion is present.     Left Ear: Hearing, ear canal and external ear normal. A middle ear effusion is present.     Nose: Rhinorrhea present. Rhinorrhea is clear.     Mouth/Throat:     Mouth: Mucous membranes are moist.     Pharynx: Posterior oropharyngeal erythema (cobblestone appearance) present. No pharyngeal swelling or oropharyngeal exudate.  Eyes:     General: Lids are normal.        Right eye: No discharge.        Left eye: No discharge.     Conjunctiva/sclera: Conjunctivae normal.     Pupils: Pupils are equal, round, and reactive to light.  Cardiovascular:     Rate and Rhythm: Normal rate and regular rhythm.     Heart sounds: Normal heart sounds. No murmur heard.    No gallop.  Pulmonary:     Effort: Pulmonary effort is normal. No accessory muscle usage or respiratory distress.     Breath sounds: Normal breath sounds.  Abdominal:     General: Bowel sounds are normal.     Palpations: Abdomen is soft.  Musculoskeletal:     Cervical back: Normal range of motion and neck supple.     Right lower leg: No edema.     Left lower leg: No edema.  Skin:    General: Skin is warm and dry.  Neurological:     Mental Status: She is alert and oriented to person, place, and  time.  Psychiatric:        Attention and Perception: Attention normal.        Mood and Affect: Mood normal.        Behavior: Behavior normal. Behavior is cooperative.        Thought Content: Thought content normal.        Judgment: Judgment normal.     Results for orders placed or performed in visit on 06/18/21  IgG, IgA, IgM  Result Value Ref Range   IgG (Immunoglobin G), Serum 832 586 - 1,602 mg/dL   IgA/Immunoglobulin A, Serum 86 (L) 87 - 352 mg/dL   IgM (Immunoglobulin M), Srm 91 26 - 217 mg/dL  CBC  with Differential/Platelets  Result Value Ref Range   WBC 8.3 3.4 - 10.8 x10E3/uL   RBC 5.18 3.77 - 5.28 x10E6/uL   Hemoglobin 14.0 11.1 - 15.9 g/dL   Hematocrit 11.9 41.7 - 46.6 %   MCV 82 79 - 97 fL   MCH 27.0 26.6 - 33.0 pg   MCHC 32.8 31.5 - 35.7 g/dL   RDW 40.8 14.4 - 81.8 %   Platelets 433 150 - 450 x10E3/uL   Neutrophils 71 Not Estab. %   Lymphs 18 Not Estab. %   Monocytes 7 Not Estab. %   Eos 3 Not Estab. %   Basos 1 Not Estab. %   Neutrophils Absolute 5.9 1.4 - 7.0 x10E3/uL   Lymphocytes Absolute 1.5 0.7 - 3.1 x10E3/uL   Monocytes Absolute 0.6 0.1 - 0.9 x10E3/uL   EOS (ABSOLUTE) 0.3 0.0 - 0.4 x10E3/uL   Basophils Absolute 0.1 0.0 - 0.2 x10E3/uL   Immature Granulocytes 0 Not Estab. %   Immature Grans (Abs) 0.0 0.0 - 0.1 x10E3/uL  Vitamin D, 25-hydroxy  Result Value Ref Range   Vit D, 25-Hydroxy 39.2 30.0 - 100.0 ng/mL      Assessment & Plan:   Problem List Items Addressed This Visit       Respiratory   Allergic rhinitis, seasonal    Chronic, ongoing.  Recommend she start Xyzal or Claritin daily + continue Flonase.  Attempt to avoid triggers.          Other   Obesity    BMI 40.24 with weight from 223 to 205 lbs on Wegovy, however now shortage and unable to obtain.  Will restart when back available as did offer benefit.  Recommended eating smaller high protein, low fat meals more frequently and exercising 30 mins a day 5 times a week with a goal of  10-15lb weight loss in the next 3 months. Patient voiced their understanding and motivation to adhere to these recommendations.       Urinary symptom or sign - Primary    Acute, with UA noting LEUKS, trace BLD, and moderate bacteria.  Will send for culture and determine if need for abx present -- if present then start based on susceptibilities. At this time recommend starting cranberry tablets at home and increase fluid.      Relevant Orders   Urinalysis, Routine w reflex microscopic   Other Visit Diagnoses     Pyuria       Urine for culture.   Relevant Orders   Urine Culture        Follow up plan: Return in about 18 weeks (around 12/06/2021) for Annual physical.

## 2021-08-02 NOTE — Patient Instructions (Signed)
Urinary Tract Infection, Adult A urinary tract infection (UTI) is an infection of any part of the urinary tract. The urinary tract includes: The kidneys. The ureters. The bladder. The urethra. These organs make, store, and get rid of pee (urine) in the body. What are the causes? This infection is caused by germs (bacteria) in your genital area. These germs grow and cause swelling (inflammation) of your urinary tract. What increases the risk? The following factors may make you more likely to develop this condition: Using a small, thin tube (catheter) to drain pee. Not being able to control when you pee or poop (incontinence). Being female. If you are female, these things can increase the risk: Using these methods to prevent pregnancy: A medicine that kills sperm (spermicide). A device that blocks sperm (diaphragm). Having low levels of a female hormone (estrogen). Being pregnant. You are more likely to develop this condition if: You have genes that add to your risk. You are sexually active. You take antibiotic medicines. You have trouble peeing because of: A prostate that is bigger than normal, if you are female. A blockage in the part of your body that drains pee from the bladder. A kidney stone. A nerve condition that affects your bladder. Not getting enough to drink. Not peeing often enough. You have other conditions, such as: Diabetes. A weak disease-fighting system (immune system). Sickle cell disease. Gout. Injury of the spine. What are the signs or symptoms? Symptoms of this condition include: Needing to pee right away. Peeing small amounts often. Pain or burning when peeing. Blood in the pee. Pee that smells bad or not like normal. Trouble peeing. Pee that is cloudy. Fluid coming from the vagina, if you are female. Pain in the belly or lower back. Other symptoms include: Vomiting. Not feeling hungry. Feeling mixed up (confused). This may be the first symptom in  older adults. Being tired and grouchy (irritable). A fever. Watery poop (diarrhea). How is this treated? Taking antibiotic medicine. Taking other medicines. Drinking enough water. In some cases, you may need to see a specialist. Follow these instructions at home:  Medicines Take over-the-counter and prescription medicines only as told by your doctor. If you were prescribed an antibiotic medicine, take it as told by your doctor. Do not stop taking it even if you start to feel better. General instructions Make sure you: Pee until your bladder is empty. Do not hold pee for a long time. Empty your bladder after sex. Wipe from front to back after peeing or pooping if you are a female. Use each tissue one time when you wipe. Drink enough fluid to keep your pee pale yellow. Keep all follow-up visits. Contact a doctor if: You do not get better after 1-2 days. Your symptoms go away and then come back. Get help right away if: You have very bad back pain. You have very bad pain in your lower belly. You have a fever. You have chills. You feeling like you will vomit or you vomit. Summary A urinary tract infection (UTI) is an infection of any part of the urinary tract. This condition is caused by germs in your genital area. There are many risk factors for a UTI. Treatment includes antibiotic medicines. Drink enough fluid to keep your pee pale yellow. This information is not intended to replace advice given to you by your health care provider. Make sure you discuss any questions you have with your health care provider. Document Revised: 09/03/2019 Document Reviewed: 09/03/2019 Elsevier Patient Education    2023 Elsevier Inc.  

## 2021-08-02 NOTE — Assessment & Plan Note (Signed)
Acute, with UA noting LEUKS, trace BLD, and moderate bacteria.  Will send for culture and determine if need for abx present -- if present then start based on susceptibilities. At this time recommend starting cranberry tablets at home and increase fluid.

## 2021-08-02 NOTE — Assessment & Plan Note (Signed)
BMI 40.24 with weight from 223 to 205 lbs on Wegovy, however now shortage and unable to obtain.  Will restart when back available as did offer benefit.  Recommended eating smaller high protein, low fat meals more frequently and exercising 30 mins a day 5 times a week with a goal of 10-15lb weight loss in the next 3 months. Patient voiced their understanding and motivation to adhere to these recommendations.

## 2021-08-04 LAB — URINE CULTURE

## 2021-08-05 NOTE — Progress Notes (Signed)
Contacted via MyChart   Good morning [name], your urine returned showing no infection present.  I recommend you continue increased fluids and cranberry tablets.  Any questions? Keep being stellar!!  Thank you for allowing me to participate in your care.  I appreciate you. Kindest regards, [name]

## 2021-08-09 ENCOUNTER — Other Ambulatory Visit: Payer: Self-pay | Admitting: Nurse Practitioner

## 2021-08-09 DIAGNOSIS — F411 Generalized anxiety disorder: Secondary | ICD-10-CM

## 2021-08-09 NOTE — Telephone Encounter (Signed)
Requested Prescriptions  Pending Prescriptions Disp Refills  . propranolol (INDERAL) 10 MG tablet [Pharmacy Med Name: PROPRANOLOL 10 MG TABLET] 30 tablet 1    Sig: TAKE 1 TABLET (10 MG TOTAL) BY MOUTH DAILY AS NEEDED. FOR SEVERE ANXIETY ATTACK     Cardiovascular:  Beta Blockers Passed - 08/09/2021  2:00 AM      Passed - Last BP in normal range    BP Readings from Last 1 Encounters:  08/02/21 100/70         Passed - Last Heart Rate in normal range    Pulse Readings from Last 1 Encounters:  08/02/21 98         Passed - Valid encounter within last 6 months    Recent Outpatient Visits          1 week ago Urinary symptom or sign   [facility] [place], [name], NP   2 months ago Multiple sclerosis (HCC)   [facility] [place], Corrie Dandy T, NP   8 months ago Multiple sclerosis (HCC)   [facility] [place], [name], NP   1 year ago Multiple sclerosis (HCC)   [facility] Cannady, Corrie Dandy T, NP   1 year ago Sinus pressure   [facility] [place], [name], NP      Future Appointments            In 4 months Cannady, [name], NP [facility]

## 2021-08-13 ENCOUNTER — Ambulatory Visit: Payer: BC Managed Care – PPO | Admitting: Nurse Practitioner

## 2021-08-30 ENCOUNTER — Other Ambulatory Visit: Payer: Self-pay | Admitting: Neurology

## 2021-08-30 MED ORDER — AMPHETAMINE-DEXTROAMPHET ER 30 MG PO CP24: 30.0000 mg | ORAL_CAPSULE | Freq: Every day | ORAL | 0 refills | Status: DC

## 2021-08-30 NOTE — Telephone Encounter (Signed)
Last OV was on 5/15/.  Next OV is scheduled for 12/17/21.  Last RX was written on 07/26/21 for 30 tabs.   [place] [facility] Database has been reviewed.

## 2021-10-05 ENCOUNTER — Other Ambulatory Visit: Payer: Self-pay | Admitting: Neurology

## 2021-10-05 MED ORDER — AMPHETAMINE-DEXTROAMPHET ER 30 MG PO CP24: 30.0000 mg | ORAL_CAPSULE | Freq: Every day | ORAL | 0 refills | Status: DC

## 2021-10-05 NOTE — Telephone Encounter (Signed)
Last seen 06/18/21 and next f/u 12/17/21. Per drug registry, last refilled 09/01/21 #30.

## 2021-10-08 ENCOUNTER — Other Ambulatory Visit: Payer: Self-pay | Admitting: Nurse Practitioner

## 2021-10-10 NOTE — Telephone Encounter (Signed)
Requested Prescriptions  Pending Prescriptions Disp Refills  . sertraline (ZOLOFT) 100 MG tablet [Pharmacy Med Name: SERTRALINE HCL 100 MG TABLET] 180 tablet 1    Sig: TAKE 2 TABLETS BY MOUTH EVERY DAY     Psychiatry:  Antidepressants - SSRI - sertraline Passed - 10/08/2021  2:25 AM      Passed - AST in normal range and within 360 days    AST  Date Value Ref Range Status  12/06/2020 11 0 - 40 IU/L Final   SGOT(AST)  Date Value Ref Range Status  07/25/2013 20 15 - 37 Unit/L Final         Passed - ALT in normal range and within 360 days    ALT  Date Value Ref Range Status  12/06/2020 10 0 - 32 IU/L Final   SGPT (ALT)  Date Value Ref Range Status  07/25/2013 18 12 - 78 U/L Final         Passed - Completed PHQ-2 or PHQ-9 in the last 360 days      Passed - Valid encounter within last 6 months    Recent Outpatient Visits          2 months ago Urinary symptom or sign   [facility] [place], Corrie Dandy T, NP   4 months ago Multiple sclerosis (HCC)   [facility] Cannady, Corrie Dandy T, NP   10 months ago Multiple sclerosis (HCC)   [facility] Cannady, Corrie Dandy T, NP   1 year ago Multiple sclerosis (HCC)   [facility] Cannady, Corrie Dandy T, NP   1 year ago Sinus pressure   [facility] [place], [name], NP      Future Appointments            In 2 months Cannady, [name], NP [facility]

## 2021-11-07 ENCOUNTER — Other Ambulatory Visit: Payer: Self-pay | Admitting: Neurology

## 2021-11-08 MED ORDER — AMPHETAMINE-DEXTROAMPHET ER 30 MG PO CP24: 30.0000 mg | ORAL_CAPSULE | Freq: Every day | ORAL | 0 refills | Status: DC

## 2021-11-08 NOTE — Telephone Encounter (Signed)
Pt has up coming appt and has been checked in the registry. 

## 2021-11-08 NOTE — Telephone Encounter (Signed)
[place] drug registry checked, last filled 10/06/21. Refill is appropriate.

## 2021-11-13 ENCOUNTER — Ambulatory Visit
Admission: RE | Admit: 2021-11-13 | Discharge: 2021-11-13 | Disposition: A | Discharge status: Home / Self Care | Payer: BC Managed Care – PPO | Source: Ambulatory Visit | Attending: Nurse Practitioner | Admitting: Nurse Practitioner

## 2021-11-13 ENCOUNTER — Ambulatory Visit: Payer: BC Managed Care – PPO | Admitting: Nurse Practitioner

## 2021-11-13 ENCOUNTER — Encounter: Payer: Self-pay | Admitting: Nurse Practitioner

## 2021-11-13 ENCOUNTER — Ambulatory Visit
Admission: RE | Admit: 2021-11-13 | Discharge: 2021-11-13 | Disposition: A | Discharge status: Home / Self Care | Payer: BC Managed Care – PPO | Source: Home / Self Care | Attending: *Deleted | Admitting: *Deleted

## 2021-11-13 VITALS — BP 135/93 | HR 126 | Temp 98.1°F | Wt 208.6 lb

## 2021-11-13 DIAGNOSIS — R000 Tachycardia, unspecified: Secondary | ICD-10-CM

## 2021-11-13 DIAGNOSIS — R051 Acute cough: Secondary | ICD-10-CM | POA: Insufficient documentation

## 2021-11-13 DIAGNOSIS — J0110 Acute frontal sinusitis, unspecified: Secondary | ICD-10-CM

## 2021-11-13 DIAGNOSIS — I4510 Unspecified right bundle-branch block: Secondary | ICD-10-CM

## 2021-11-13 MED ORDER — AMOXICILLIN-POT CLAVULANATE 875-125 MG PO TABS: 1.0000 | ORAL_TABLET | Freq: Two times a day (BID) | ORAL | 0 refills | Status: AC

## 2021-11-13 NOTE — Assessment & Plan Note (Signed)
Seen on EKG on 11/13/21.  Discussed with patient during visit.

## 2021-11-13 NOTE — Progress Notes (Signed)
Hi [name].  Your chest xray was normal.  Continue with the plan as discussed during your visit.

## 2021-11-13 NOTE — Progress Notes (Signed)
BP (!) 135/93   Pulse (!) 126   Temp 98.1 F (36.7 C) (Oral)   Wt 208 lb 9.6 oz (94.6 kg)   SpO2 98%   BMI 38.15 kg/m    Subjective:    Patient ID: [name], female    DOB: 04/03/1980, 41 y.o.   MRN: [id number]  HPI: [name] is a 41 y.o. female  Chief Complaint  Patient presents with   Cough    Post nasal drip, cough x 2 weeks. OTC mucinex.    UPPER RESPIRATORY TRACT INFECTION Worst symptom: symptoms have been going on for about 2 weeks.  Negative home COVID test. Fever: no Cough: yes Shortness of breath: yes Wheezing:  some Chest pain: no Chest tightness: no Chest congestion: no Nasal congestion: yes Runny nose:  sometimes Post nasal drip: yes Sneezing: no Sore throat: no Swollen glands: no Sinus pressure: yes Headache: yes Face pain: yes Toothache: no Ear pain: no bilateral Ear pressure: no bilateral Eyes red/itching:no Eye drainage/crusting: no  Vomiting: no Rash: no Fatigue: yes Sick contacts: no Strep contacts: no  Context: fluctuating Recurrent sinusitis: no Relief with OTC cold/cough medications: yes  Treatments attempted: pseudoephedrine   Relevant past medical, surgical, family and social history reviewed and updated as indicated. Interim medical history since our last visit reviewed. Allergies and medications reviewed and updated.  Review of Systems  Constitutional:  Positive for fatigue. Negative for fever.  HENT:  Positive for congestion, postnasal drip, rhinorrhea, sinus pressure and sinus pain. Negative for dental problem, ear pain, sneezing and sore throat.   Respiratory:  Positive for cough, shortness of breath and wheezing.   Cardiovascular:  Negative for chest pain.  Gastrointestinal:  Negative for vomiting.  Skin:  Negative for rash.  Neurological:  Positive for headaches.    Per HPI unless specifically indicated above     Objective:    BP (!) 135/93   Pulse (!) 126   Temp 98.1 F (36.7  C) (Oral)   Wt 208 lb 9.6 oz (94.6 kg)   SpO2 98%   BMI 38.15 kg/m   Wt Readings from Last 3 Encounters:  11/13/21 208 lb 9.6 oz (94.6 kg)  08/02/21 205 lb 12.8 oz (93.4 kg)  06/18/21 223 lb (101.2 kg)    Physical Exam Vitals and nursing note reviewed.  Constitutional:      General: She is not in acute distress.    Appearance: Normal appearance. She is normal weight. She is not ill-appearing, toxic-appearing or diaphoretic.  HENT:     Head: Normocephalic.     Salivary Glands: Right salivary gland is not diffusely enlarged or tender. Left salivary gland is not diffusely enlarged or tender.     Right Ear: External ear normal.     Left Ear: External ear normal.     Nose: Congestion present. No rhinorrhea.     Right Sinus: Frontal sinus tenderness present. No maxillary sinus tenderness.     Left Sinus: Frontal sinus tenderness present. No maxillary sinus tenderness.     Mouth/Throat:     Mouth: Mucous membranes are moist.     Pharynx: Oropharynx is clear. Posterior oropharyngeal erythema present. No oropharyngeal exudate.  Eyes:     General:        Right eye: No discharge.        Left eye: No discharge.     Extraocular Movements: Extraocular movements intact.     Conjunctiva/sclera: Conjunctivae normal.     Pupils: Pupils are  equal, round, and reactive to light.  Cardiovascular:     Rate and Rhythm: Normal rate and regular rhythm.     Heart sounds: No murmur heard. Pulmonary:     Effort: Pulmonary effort is normal. No respiratory distress.     Breath sounds: Normal breath sounds. No wheezing or rales.  Musculoskeletal:     Cervical back: Normal range of motion and neck supple.  Skin:    General: Skin is warm and dry.     Capillary Refill: Capillary refill takes less than 2 seconds.  Neurological:     General: No focal deficit present.     Mental Status: She is alert and oriented to person, place, and time. Mental status is at baseline.  Psychiatric:        Mood and  Affect: Mood normal.        Behavior: Behavior normal.        Thought Content: Thought content normal.        Judgment: Judgment normal.     Results for orders placed or performed in visit on 08/02/21  Urine Culture   Specimen: Urine   UR  Result Value Ref Range   Urine Culture, Routine Final report    Organism ID, Bacteria Comment   Microscopic Examination   Urine  Result Value Ref Range   WBC, UA 11-30 (A) 0 - 5 /hpf   RBC, Urine 0-2 0 - 2 /hpf   Epithelial Cells (non renal) 0-10 0 - 10 /hpf   Mucus, UA Present (A) Not Estab.   Bacteria, UA Many (A) None seen/Few  Urinalysis, Routine w reflex microscopic  Result Value Ref Range   Specific Gravity, UA >1.030 (H) 1.005 - 1.030   pH, UA 5.5 5.0 - 7.5   Color, UA Yellow Yellow   Appearance Ur Cloudy (A) Clear   Leukocytes,UA 1+ (A) Negative   Protein,UA Negative Negative/Trace   Glucose, UA Negative Negative   Ketones, UA Negative Negative   RBC, UA Trace (A) Negative   Bilirubin, UA Negative Negative   Urobilinogen, Ur 0.2 0.2 - 1.0 mg/dL   Nitrite, UA Negative Negative   Microscopic Examination See below:       Assessment & Plan:   Problem List Items Addressed This Visit       Cardiovascular and Mediastinum   Right bundle branch block (RBBB)    Seen on EKG on 11/13/21.  Discussed with patient during visit.      Other Visit Diagnoses     Acute non-recurrent frontal sinusitis    -  Primary   Complete course of augmentin.  Increase fluids, recommend changing pseudofed to mucinex.  Follow up if not improved.    Relevant Medications   amoxicillin-clavulanate (AUGMENTIN) 875-125 MG tablet   Tachycardia       EKG showed Right bundle branch block. Discussed with patient during visit.    Relevant Orders   EKG 12-Lead (Completed)   Acute cough       Chest xray ordered due to tachycardia to rule out pneumonia.    Relevant Orders   DG Chest 2 View        Follow up plan: Return if symptoms worsen or fail to  improve.

## 2021-11-13 NOTE — Progress Notes (Signed)
Results discussed with patient during visit.

## 2021-12-03 ENCOUNTER — Encounter: Payer: Self-pay | Admitting: Neurology

## 2021-12-03 ENCOUNTER — Other Ambulatory Visit: Payer: Self-pay | Admitting: *Deleted

## 2021-12-03 DIAGNOSIS — F5105 Insomnia due to other mental disorder: Secondary | ICD-10-CM

## 2021-12-03 MED ORDER — BACLOFEN 10 MG PO TABS: ORAL_TABLET | ORAL | 1 refills | Status: DC

## 2021-12-03 MED ORDER — TRAZODONE HCL 100 MG PO TABS: 100.0000 mg | ORAL_TABLET | Freq: Every evening | ORAL | 1 refills | Status: DC | PRN

## 2021-12-05 ENCOUNTER — Other Ambulatory Visit: Payer: Self-pay | Admitting: Family Medicine

## 2021-12-05 MED ORDER — AMPHETAMINE-DEXTROAMPHET ER 30 MG PO CP24: 30.0000 mg | ORAL_CAPSULE | Freq: Every day | ORAL | 0 refills | Status: DC

## 2021-12-09 NOTE — Patient Instructions (Addendum)
Please call to schedule your mammogram and/or bone density: [facility] [place]: [address], [place], [place] [address] Phone: [phone]  [place] [facility] [address] [place],    [address] Phone: [phone]    Healthy Eating Following a healthy eating pattern may help you to achieve and maintain a healthy body weight, reduce the risk of chronic disease, and live a long and productive life. It is important to follow a healthy eating pattern at an appropriate calorie level for your body. Your nutritional needs should be met primarily through food by choosing a variety of nutrient-rich foods. What are tips for following this plan? Reading food labels Read labels and choose the following: Reduced or low sodium. Juices with 100% fruit juice. Foods with low saturated fats and high polyunsaturated and monounsaturated fats. Foods with whole grains, such as whole wheat, cracked wheat, brown rice, and wild rice. Whole grains that are fortified with folic acid. This is recommended for women who are pregnant or who want to become pregnant. Read labels and avoid the following: Foods with a lot of added sugars. These include foods that contain brown sugar, corn sweetener, corn syrup, dextrose, fructose, glucose, high-fructose corn syrup, honey, invert sugar, lactose, malt syrup, maltose, molasses, raw sugar, sucrose, trehalose, or turbinado sugar. Do not eat more than the following amounts of added sugar per day: 6 teaspoons (25 g) for women. 9 teaspoons (38 g) for men. Foods that contain processed or refined starches and grains. Refined grain products, such as white flour, degermed cornmeal, white bread, and white rice. Shopping Choose nutrient-rich snacks, such as vegetables, whole fruits, and nuts. Avoid high-calorie and high-sugar snacks, such as potato chips, fruit snacks, and candy. Use oil-based  dressings and spreads on foods instead of solid fats such as butter, stick margarine, or cream cheese. Limit pre-made sauces, mixes, and "instant" products such as flavored rice, instant noodles, and ready-made pasta. Try more plant-protein sources, such as tofu, tempeh, black beans, edamame, lentils, nuts, and seeds. Explore eating plans such as the Mediterranean diet or vegetarian diet. Cooking Use oil to saut or stir-fry foods instead of solid fats such as butter, stick margarine, or lard. Try baking, boiling, grilling, or broiling instead of frying. Remove the fatty part of meats before cooking. Steam vegetables in water or broth. Meal planning  At meals, imagine dividing your plate into fourths: One-half of your plate is fruits and vegetables. One-fourth of your plate is whole grains. One-fourth of your plate is protein, especially lean meats, poultry, eggs, tofu, beans, or nuts. Include low-fat dairy as part of your daily diet. Lifestyle Choose healthy options in all settings, including home, work, school, restaurants, or stores. Prepare your food safely: Wash your hands after handling raw meats. Keep food preparation surfaces clean by regularly washing with hot, soapy water. Keep raw meats separate from ready-to-eat foods, such as fruits and vegetables. Cook seafood, meat, poultry, and eggs to the recommended internal temperature. Store foods at safe temperatures. In general: Keep cold foods at 67F (4.4C) or below. Keep hot foods at 167F (60C) or above. Keep your freezer at [facility] (-17.8C) or below. Foods are no longer safe to eat when they have been between the temperatures of 40-167F (4.4-60C) for more than 2 hours. What foods should I eat? Fruits Aim to eat 2 cup-equivalents of fresh, canned (in natural juice), or frozen fruits each day. Examples of 1 cup-equivalent of fruit  include 1 small apple, 8 large strawberries, 1 cup canned fruit,  cup dried fruit, or 1 cup 100%  juice. Vegetables Aim to eat 2-3 cup-equivalents of fresh and frozen vegetables each day, including different varieties and colors. Examples of 1 cup-equivalent of vegetables include 2 medium carrots, 2 cups raw, leafy greens, 1 cup chopped vegetable (raw or cooked), or 1 medium baked potato. Grains Aim to eat 6 ounce-equivalents of whole grains each day. Examples of 1 ounce-equivalent of grains include 1 slice of bread, 1 cup ready-to-eat cereal, 3 cups popcorn, or  cup cooked rice, pasta, or cereal. Meats and other proteins Aim to eat 5-6 ounce-equivalents of protein each day. Examples of 1 ounce-equivalent of protein include 1 egg, 1/2 cup nuts or seeds, or 1 tablespoon (16 g) peanut butter. A cut of meat or fish that is the size of a deck of cards is about 3-4 ounce-equivalents. Of the protein you eat each week, try to have at least 8 ounces come from seafood. This includes salmon, trout, herring, and anchovies. Dairy Aim to eat 3 cup-equivalents of fat-free or low-fat dairy each day. Examples of 1 cup-equivalent of dairy include 1 cup (240 mL) milk, 8 ounces (250 g) yogurt, 1 ounces (44 g) natural cheese, or 1 cup (240 mL) fortified soy milk. Fats and oils Aim for about 5 teaspoons (21 g) per day. Choose monounsaturated fats, such as canola and olive oils, avocados, peanut butter, and most nuts, or polyunsaturated fats, such as sunflower, corn, and soybean oils, walnuts, pine nuts, sesame seeds, sunflower seeds, and flaxseed. Beverages Aim for six 8-oz glasses of water per day. Limit coffee to three to five 8-oz cups per day. Limit caffeinated beverages that have added calories, such as soda and energy drinks. Limit alcohol intake to no more than 1 drink a day for nonpregnant women and 2 drinks a day for men. One drink equals 12 oz of beer (355 mL), 5 oz of wine (148 mL), or 1 oz of hard liquor (44 mL). Seasoning and other foods Avoid adding excess amounts of salt to your foods. Try  flavoring foods with herbs and spices instead of salt. Avoid adding sugar to foods. Try using oil-based dressings, sauces, and spreads instead of solid fats. This information is based on general [place] nutrition guidelines. For more information, visit [employer]. Exact amounts may vary based on your nutrition needs. Summary A healthy eating plan may help you to maintain a healthy weight, reduce the risk of chronic diseases, and stay active throughout your life. Plan your meals. Make sure you eat the right portions of a variety of nutrient-rich foods. Try baking, boiling, grilling, or broiling instead of frying. Choose healthy options in all settings, including home, work, school, restaurants, or stores. This information is not intended to replace advice given to you by your health care provider. Make sure you discuss any questions you have with your health care provider. Document Revised: 07/04/2021 Document Reviewed: 09/19/2020 Elsevier Patient Education  [place].

## 2021-12-14 ENCOUNTER — Ambulatory Visit (INDEPENDENT_AMBULATORY_CARE_PROVIDER_SITE_OTHER): Payer: BC Managed Care – PPO | Admitting: Nurse Practitioner

## 2021-12-14 ENCOUNTER — Encounter: Payer: Self-pay | Admitting: Nurse Practitioner

## 2021-12-14 VITALS — BP 107/72 | HR 89 | Temp 98.0°F | Ht 62.0 in | Wt 213.1 lb

## 2021-12-14 DIAGNOSIS — Z803 Family history of malignant neoplasm of breast: Secondary | ICD-10-CM

## 2021-12-14 DIAGNOSIS — F988 Other specified behavioral and emotional disorders with onset usually occurring in childhood and adolescence: Secondary | ICD-10-CM

## 2021-12-14 DIAGNOSIS — G43009 Migraine without aura, not intractable, without status migrainosus: Secondary | ICD-10-CM | POA: Diagnosis not present

## 2021-12-14 DIAGNOSIS — E6609 Other obesity due to excess calories: Secondary | ICD-10-CM

## 2021-12-14 DIAGNOSIS — R490 Dysphonia: Secondary | ICD-10-CM | POA: Diagnosis not present

## 2021-12-14 DIAGNOSIS — F5105 Insomnia due to other mental disorder: Secondary | ICD-10-CM

## 2021-12-14 DIAGNOSIS — G35 Multiple sclerosis: Secondary | ICD-10-CM

## 2021-12-14 DIAGNOSIS — Z6836 Body mass index (BMI) 36.0-36.9, adult: Secondary | ICD-10-CM

## 2021-12-14 DIAGNOSIS — E559 Vitamin D deficiency, unspecified: Secondary | ICD-10-CM

## 2021-12-14 DIAGNOSIS — F411 Generalized anxiety disorder: Secondary | ICD-10-CM

## 2021-12-14 DIAGNOSIS — Z23 Encounter for immunization: Secondary | ICD-10-CM | POA: Diagnosis not present

## 2021-12-14 DIAGNOSIS — F0632 Mood disorder due to known physiological condition with major depressive-like episode: Secondary | ICD-10-CM | POA: Diagnosis not present

## 2021-12-14 DIAGNOSIS — Z0000 Encounter for general adult medical examination without abnormal findings: Secondary | ICD-10-CM | POA: Diagnosis not present

## 2021-12-14 DIAGNOSIS — E7800 Pure hypercholesterolemia, unspecified: Secondary | ICD-10-CM

## 2021-12-14 MED ORDER — BUPROPION HCL ER (XL) 150 MG PO TB24: 150.0000 mg | ORAL_TABLET | Freq: Every day | ORAL | 12 refills | Status: DC

## 2021-12-14 MED ORDER — SERTRALINE HCL 100 MG PO TABS: 200.0000 mg | ORAL_TABLET | Freq: Every day | ORAL | 12 refills | Status: DC

## 2021-12-14 MED ORDER — PROPRANOLOL HCL 10 MG PO TABS: 10.0000 mg | ORAL_TABLET | Freq: Every day | ORAL | 12 refills | Status: DC | PRN

## 2021-12-14 NOTE — Assessment & Plan Note (Signed)
Noted on past labs with ASCVD 0.4%, continue to monitor and diet focus with focus on modest weight loss.  Lipid panel and CMP today.

## 2021-12-14 NOTE — Assessment & Plan Note (Signed)
Chronic, stable.  Denies SI/HI.  Followed by psychiatry in past, last note reviewed.  Continue current medication regimen and adjust as needed, PCP has taken over refills.  Continue therapy sessions.

## 2021-12-14 NOTE — Assessment & Plan Note (Signed)
Chronic, ongoing.  Continue current supplement and check Vit D today.

## 2021-12-14 NOTE — Assessment & Plan Note (Addendum)
Chronic, ongoing, followed by neurology, continue current medication regimen and collaboration with them.

## 2021-12-14 NOTE — Assessment & Plan Note (Signed)
BMI 38.98.  Recommended eating smaller high protein, low fat meals more frequently and exercising 30 mins a day 5 times a week with a goal of 10-15lb weight loss in the next 3 months. Patient voiced their understanding and motivation to adhere to these recommendations.  

## 2021-12-14 NOTE — Assessment & Plan Note (Signed)
Chronic, stable with Topamax.  Continue collaboration with neurology and recent notes reviewed.

## 2021-12-14 NOTE — Assessment & Plan Note (Signed)
Maternal aunt and great grandmother, will start mammogram screening due to history.  Order placed.

## 2021-12-14 NOTE — Progress Notes (Signed)
BP 107/72   Pulse 89   Temp 98 F (36.7 C) (Oral)   Ht _0  (1.575 m)   Wt 213 lb 1.6 oz (96.7 kg)   SpO2 98%   BMI 38.98 kg/m    Subjective:    Patient ID: [name], female    DOB: 1980-12-03, 41 y.o.   MRN: [id number]  HPI: [name] is a 40 y.o. female presenting on 12/14/2021 for comprehensive medical examination. Current medical complaints include:none  She currently lives with: husband and children Menopausal Symptoms: no   Requests referral to ENT for ongoing hoarseness, she is a Primary school teacher.  The 10-year ASCVD risk score (Arnett DK, et al., 2019) is: 0.4%   Values used to calculate the score:     Age: 3 years     Sex: Female     Is Non-Hispanic African American: No     Diabetic: No     Tobacco smoker: No     Systolic Blood Pressure: 782 mmHg     Is BP treated: No     HDL Cholesterol: 58 mg/dL     Total Cholesterol: 192 mg/dL  MIGRAINES & MS Continues on Topamax and is followed by neurology for this and her MS -- last saw 06/18/21.  Does infusions for MS -- last in August 2023 - Ocrelizumab.  Headaches recently have been more sinus related, but overall Topamax helps with migraine prevention. Headache status at time of visit: asymptomatic Treatments attempted: none, triptans and topamax   Aura: yes Nausea:  yes Vomiting: no Photophobia:  no Phonophobia:  no Effect on social functioning:  no Numbers of missed days of school/work each month: 0 Confusion:  no Gait disturbance/ataxia:  no Behavioral changes:  no Fevers:  no    DEPRESSION Followed by psychiatry at [facility], Dr. Shea Evans in past.  Last seen 04/14/20 -- currently PCP took over medications. Followed by therapist at this time.  Continues on Sertraline, Wellbutrin, Adderall, and Trazodone + Propranolol as needed.  Adderall is filled by neurology. Mood status: stable Satisfied with current treatment?: yes Symptom severity: mild  Duration of current treatment : chronic Side  effects: no Medication compliance: good compliance Psychotherapy/counseling: yes current Depressed mood: yes Anxious mood: no Anhedonia: no Significant weight loss or gain: no Insomnia: no hard to fall asleep Fatigue: no Feelings of worthlessness or guilt: no Impaired concentration/indecisiveness: no Suicidal ideations: no Hopelessness: no Crying spells: no    12/14/2021    9:50 AM 11/13/2021   10:25 AM 08/02/2021   11:21 AM 06/06/2021    9:05 AM 12/06/2020   10:06 AM  Depression screen PHQ 2/9  Decreased Interest _1 Down, Depressed, Hopeless 0 0 0 1 0  PHQ - 2 Score _2 Altered sleeping _3 Tired, decreased energy _4 Change in appetite _5 Feeling bad or failure about yourself  1 0 0 1 1  Trouble concentrating 1 0 1 2 0  Moving slowly or fidgety/restless 0 0 0 1 2  Suicidal thoughts 0 0 0 0 0  PHQ-9 Score _6 Difficult doing work/chores Somewhat difficult Somewhat difficult Somewhat difficult  Not difficult at all      12/14/2021    9:51 AM 11/13/2021   10:26 AM 08/02/2021   11:21 AM 06/06/2021    9:05 AM  GAD 7 : Generalized  Anxiety Score  Nervous, Anxious, on Edge _0 Control/stop worrying 1 0 0 1  Worry too much - different things 1 1 0 1  Trouble relaxing _1 0  Restless 0 0 0 0  Easily annoyed or irritable _2 Afraid - awful might happen 0 0 0 0  Total GAD 7 Score _3 Anxiety Difficulty Somewhat difficult Somewhat difficult Somewhat difficult Somewhat difficult   The patient does not have a history of falls. I did not complete a risk assessment for falls. A plan of care for falls was not documented.  Past Medical History:  Past Medical History:  Diagnosis Date   Depression    Migraine    MS (multiple sclerosis) ([place])    Multiple sclerosis ([place])    Spinal stenosis     Surgical History:  Past Surgical History:  Procedure Laterality Date   NO PAST SURGERIES      Medications:  Current  Outpatient Medications on File Prior to Visit  Medication Sig   albuterol (VENTOLIN HFA) 108 (90 Base) MCG/ACT inhaler TAKE 2 PUFFS BY MOUTH EVERY 6 HOURS AS NEEDED FOR WHEEZE OR SHORTNESS OF BREATH   amphetamine-dextroamphetamine (ADDERALL XR) 30 MG 24 hr capsule Take 1 capsule (30 mg total) by mouth daily.   baclofen (LIORESAL) 10 MG tablet TAKE 1 TABLET BY MOUTH FOUR TIMES A DAY AS NEEDED FOR MUSCLE SPASMS   Cholecalciferol 25 MCG (1000 UT) tablet Take 5,000 Units by mouth daily.    fluticasone (FLONASE) 50 MCG/ACT nasal spray SPRAY 2 SPRAYS INTO EACH NOSTRIL EVERY DAY   ocrelizumab 600 mg in sodium chloride 0.9 % 500 mL Inject 600 mg into the vein every 6 (six) months.   topiramate (TOPAMAX) 100 MG tablet TAKE 1 TABLET BY MOUTH EVERYDAY AT BEDTIME   traZODone (DESYREL) 100 MG tablet Take 1 tablet (100 mg total) by mouth at bedtime as needed for sleep.   No current facility-administered medications on file prior to visit.    Allergies:  Allergies  Allergen Reactions   Flexeril [Cyclobenzaprine] Other (See Comments)    Drowsy when taking during the day     Social History:  Social History   Socioeconomic History   Marital status: Married    Spouse name: steven ambrose   Number of children: 3   Years of education: Not on file   Highest education level: Bachelor's degree (e.g., BA, AB, BS)  Occupational History   Not on file  Tobacco Use   Smoking status: Never   Smokeless tobacco: Never  Vaping Use   Vaping Use: Never used  Substance and Sexual Activity   Alcohol use: Not Currently    Alcohol/week: 0.0 standard drinks of alcohol    Comment: occasional/fim   Drug use: No   Sexual activity: Yes    Birth control/protection: I.U.D.  Other Topics Concern   Not on file  Social History Narrative   Not on file   Social Determinants of Health   Financial Resource Strain: Low Risk  (05/21/2018)   Overall Financial Resource Strain (CARDIA)    Difficulty of Paying Living  Expenses: Not hard at all  Food Insecurity: No Food Insecurity (05/21/2018)   Hunger Vital Sign    Worried About Running Out of Food in the Last Year: Never true    Ran Out of Food in the Last Year: Never true  Transportation Needs: No Transportation Needs (05/21/2018)   PRAPARE -  Hydrologist (Medical): No    Lack of Transportation (Non-Medical): No  Physical Activity: Inactive (05/21/2018)   Exercise Vital Sign    Days of Exercise per Week: 0 days    Minutes of Exercise per Session: 0 min  Stress: No Stress Concern Present (05/21/2018)   [place]    Feeling of Stress : Only a little  Social Connections: Unknown (05/21/2018)   Social Connection and Isolation Panel [NHANES]    Frequency of Communication with Friends and Family: Not on file    Frequency of Social Gatherings with Friends and Family: Not on file    Attends Religious Services: More than 4 times per year    Active Member of [employer] or Organizations: No    Attends Archivist Meetings: Never    Marital Status: Married  Human resources officer Violence: Not At Risk (05/21/2018)   Humiliation, Afraid, Rape, and Kick questionnaire    Fear of Current or Ex-Partner: No    Emotionally Abused: No    Physically Abused: No    Sexually Abused: No   Social History   Tobacco Use  Smoking Status Never  Smokeless Tobacco Never   Social History   Substance and Sexual Activity  Alcohol Use Not Currently   Alcohol/week: 0.0 standard drinks of alcohol   Comment: occasional/fim    Family History:  Family History  Problem Relation Age of Onset   Multiple myeloma Mother    Multiple sclerosis Mother    Obesity Sister    Anxiety disorder Daughter    Multiple sclerosis Maternal Aunt     Past medical history, surgical history, medications, allergies, family history and social history reviewed with patient today and changes made to  appropriate areas of the chart.   Review of Systems - negative All other ROS negative except what is listed above and in the HPI.      Objective:    BP 107/72   Pulse 89   Temp 98 F (36.7 C) (Oral)   Ht _0  (1.575 m)   Wt 213 lb 1.6 oz (96.7 kg)   SpO2 98%   BMI 38.98 kg/m   Wt Readings from Last 3 Encounters:  12/14/21 213 lb 1.6 oz (96.7 kg)  11/13/21 208 lb 9.6 oz (94.6 kg)  08/02/21 205 lb 12.8 oz (93.4 kg)    Physical Exam Vitals and nursing note reviewed.  Constitutional:      General: She is awake. She is not in acute distress.    Appearance: She is well-developed and well-groomed. She is obese. She is not ill-appearing or toxic-appearing.  HENT:     Head: Normocephalic and atraumatic.     Right Ear: Hearing, tympanic membrane, ear canal and external ear normal. No drainage.     Left Ear: Hearing, tympanic membrane, ear canal and external ear normal. No drainage.     Nose: Nose normal.     Right Sinus: No maxillary sinus tenderness or frontal sinus tenderness.     Left Sinus: No maxillary sinus tenderness or frontal sinus tenderness.     Mouth/Throat:     Mouth: Mucous membranes are moist.     Pharynx: Oropharynx is clear. Uvula midline. No pharyngeal swelling, oropharyngeal exudate or posterior oropharyngeal erythema.  Eyes:     General: Lids are normal.        Right eye: No discharge.        Left eye: No discharge.  Extraocular Movements: Extraocular movements intact.     Conjunctiva/sclera: Conjunctivae normal.     Pupils: Pupils are equal, round, and reactive to light.     Visual Fields: Right eye visual fields normal and left eye visual fields normal.  Neck:     Thyroid: No thyromegaly.     Vascular: No carotid bruit.     Trachea: Trachea normal.  Cardiovascular:     Rate and Rhythm: Normal rate and regular rhythm.     Heart sounds: Normal heart sounds. No murmur heard.    No gallop.  Pulmonary:     Effort: Pulmonary effort is normal. No  accessory muscle usage or respiratory distress.     Breath sounds: Normal breath sounds.  Chest:  Breasts:    Right: Normal.     Left: Normal.  Abdominal:     General: Bowel sounds are normal.     Palpations: Abdomen is soft. There is no hepatomegaly or splenomegaly.     Tenderness: There is no abdominal tenderness.  Genitourinary:    Vagina: Normal.     Cervix: Normal.     Uterus: Normal.      Adnexa: Right adnexa normal and left adnexa normal.  Musculoskeletal:        General: Normal range of motion.     Cervical back: Normal range of motion and neck supple.     Right lower leg: No edema.     Left lower leg: No edema.  Lymphadenopathy:     Head:     Right side of head: No submental, submandibular, tonsillar, preauricular or posterior auricular adenopathy.     Left side of head: No submental, submandibular, tonsillar, preauricular or posterior auricular adenopathy.     Cervical: No cervical adenopathy.     Upper Body:     Right upper body: No supraclavicular, axillary or pectoral adenopathy.     Left upper body: No supraclavicular, axillary or pectoral adenopathy.  Skin:    General: Skin is warm and dry.     Capillary Refill: Capillary refill takes less than 2 seconds.     Findings: No rash.  Neurological:     Mental Status: She is alert and oriented to person, place, and time.     Gait: Gait is intact.     Deep Tendon Reflexes: Reflexes are normal and symmetric.     Reflex Scores:      Brachioradialis reflexes are 2+ on the right side and 2+ on the left side.      Patellar reflexes are 2+ on the right side and 2+ on the left side. Psychiatric:        Attention and Perception: Attention normal.        Mood and Affect: Mood normal.        Speech: Speech normal.        Behavior: Behavior normal. Behavior is cooperative.        Thought Content: Thought content normal.        Judgment: Judgment normal.    Results for orders placed or performed in visit on 08/02/21  Urine  Culture   Specimen: Urine   UR  Result Value Ref Range   Urine Culture, Routine Final report    Organism ID, Bacteria Comment   Microscopic Examination   Urine  Result Value Ref Range   WBC, UA 11-30 (A) 0 - 5 /hpf   RBC, Urine 0-2 0 - 2 /hpf   Epithelial Cells (non renal) 0-10 0 - 10 /  hpf   Mucus, UA Present (A) Not Estab.   Bacteria, UA Many (A) None seen/Few  Urinalysis, Routine w reflex microscopic  Result Value Ref Range   Specific Gravity, UA >1.030 (H) 1.005 - 1.030   pH, UA 5.5 5.0 - 7.5   Color, UA Yellow Yellow   Appearance Ur Cloudy (A) Clear   Leukocytes,UA 1+ (A) Negative   Protein,UA Negative Negative/Trace   Glucose, UA Negative Negative   Ketones, UA Negative Negative   RBC, UA Trace (A) Negative   Bilirubin, UA Negative Negative   Urobilinogen, Ur 0.2 0.2 - 1.0 mg/dL   Nitrite, UA Negative Negative   Microscopic Examination See below:       Assessment & Plan:   Problem List Items Addressed This Visit       Cardiovascular and Mediastinum   Migraine without aura and without status migrainosus, not intractable    Chronic, stable with Topamax.  Continue collaboration with neurology and recent notes reviewed.      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  Denies SI/HI.  Followed by psychiatry in past, last note reviewed.  Continue current medication regimen and adjust as needed, PCP has taken over refills.  Continue therapy sessions.      Relevant Orders   TSH   Multiple sclerosis ([place]) - Primary    Chronic, ongoing -- with infusions.  Continue to collaborate with neurology and current treatment.      Relevant Orders   CBC with Differential/Platelet     Other   Attention deficit disorder    Chronic, ongoing,  followed by neurology, continue current medication regimen and collaboration with them.      Elevated low density lipoprotein (LDL) cholesterol level    Noted on past labs with ASCVD 0.4%, continue to monitor and diet focus with focus on modest weight loss.  Lipid panel and CMP today.      Relevant Orders   Comprehensive metabolic panel   Lipid Panel w/o Chol/HDL Ratio   Family history of breast cancer    Maternal aunt and great grandmother, will start mammogram screening due to history.  Order placed.      Relevant Orders   MM 3D SCREEN BREAST BILATERAL   GAD (generalized anxiety disorder)    Chronic, stable.  Denies SI/HI.  Followed by psychiatry in past, last note reviewed.  Continue current medication regimen and adjust as needed, PCP has taken over refills.  Continue therapy sessions.      Relevant Medications   sertraline (ZOLOFT) 100 MG tablet   propranolol (INDERAL) 10 MG tablet   buPROPion (WELLBUTRIN XL) 150 MG 24 hr tablet   Insomnia due to mental disorder    Chronic, ongoing secondary to MS.  Continue Trazodone and collaboration with neurology and therapy.        Obesity    BMI 38.98. Recommended eating smaller high protein, low fat meals more frequently and exercising 30 mins a day 5 times a week with a goal of 10-15lb weight loss in the next 3 months. Patient voiced their understanding and motivation to adhere to these recommendations.       Vitamin D deficiency    Chronic, ongoing.  Continue current supplement and check Vit D  today.      Relevant Orders   VITAMIN D 25 Hydroxy (Vit-D Deficiency, Fractures)   Other Visit Diagnoses     Hoarseness       Referral to ENT   Relevant Orders   Ambulatory referral to ENT   Flu vaccine need       Flu vaccine in office today.   Relevant Orders   Flu Vaccine QUAD 6+ mos PF IM (Fluarix Quad PF) (Completed)   Encounter for annual physical exam       Annual physical today with labs and health maintenance reviewed,  discussed with patient.        Follow up plan: Return in about 6 months (around 06/14/2022) for MOOD, MIGRAINES, MS.   LABORATORY TESTING:  - Pap smear: Up To Date  IMMUNIZATIONS:   - Tdap: Tetanus vaccination status reviewed: not to get due to MS. - Influenza: Provided today - Pneumovax: Not applicable - Prevnar: Not applicable - HPV: Not applicable - Zostavax vaccine: Not applicable  SCREENING: -Mammogram: Ordered today -- family history -- aunt and maternal great grandmother - Colonoscopy: Not applicable  - Bone Density: Not applicable  -Hearing Test: Not applicable  -Spirometry: Not applicable   PATIENT COUNSELING:   Advised to take 1 mg of folate supplement per day if capable of pregnancy.   Sexuality: Discussed sexually transmitted diseases, partner selection, use of condoms, avoidance of unintended pregnancy  and contraceptive alternatives.   Advised to avoid cigarette smoking.  I discussed with the patient that most people either abstain from alcohol or drink within safe limits (<=14/week and <=4 drinks/occasion for males, <=7/weeks and <= 3 drinks/occasion for females) and that the risk for alcohol disorders and other health effects rises proportionally with the number of drinks per week and how often a drinker exceeds daily limits.  Discussed cessation/primary prevention of drug use and availability of treatment for abuse.   Diet: Encouraged to adjust caloric intake to maintain  or achieve ideal body weight, to reduce intake of dietary saturated fat and total fat, to limit sodium intake by avoiding high sodium foods and not adding table salt, and to maintain adequate dietary potassium and calcium preferably from fresh fruits, vegetables, and low-fat dairy products.    Stressed the importance of regular exercise  Injury prevention: Discussed safety belts, safety helmets, smoke detector, smoking near bedding or upholstery.   Dental health: Discussed importance of  regular tooth brushing, flossing, and dental visits.    NEXT PREVENTATIVE PHYSICAL DUE IN 1 YEAR. Return in about 6 months (around 06/14/2022) for MOOD, MIGRAINES, MS.

## 2021-12-14 NOTE — Assessment & Plan Note (Signed)
Chronic, ongoing -- with infusions.  Continue to collaborate with neurology and current treatment.

## 2021-12-14 NOTE — Assessment & Plan Note (Signed)
Chronic, stable.  Denies SI/HI.  Followed by psychiatry in past, last note reviewed.  Continue current medication regimen and adjust as needed, PCP has taken over refills.  Continue therapy sessions. 

## 2021-12-14 NOTE — Assessment & Plan Note (Signed)
Chronic, ongoing secondary to MS.  Continue Trazodone and collaboration with neurology and therapy.

## 2021-12-17 ENCOUNTER — Other Ambulatory Visit: Payer: BC Managed Care – PPO

## 2021-12-17 ENCOUNTER — Encounter: Payer: Self-pay | Admitting: Neurology

## 2021-12-17 ENCOUNTER — Ambulatory Visit: Payer: BC Managed Care – PPO | Admitting: Neurology

## 2021-12-17 VITALS — BP 135/88 | HR 99 | Ht 62.0 in | Wt 214.5 lb

## 2021-12-17 DIAGNOSIS — G35 Multiple sclerosis: Secondary | ICD-10-CM

## 2021-12-17 DIAGNOSIS — R269 Unspecified abnormalities of gait and mobility: Secondary | ICD-10-CM

## 2021-12-17 DIAGNOSIS — G43009 Migraine without aura, not intractable, without status migrainosus: Secondary | ICD-10-CM

## 2021-12-17 DIAGNOSIS — Z79899 Other long term (current) drug therapy: Secondary | ICD-10-CM

## 2021-12-17 DIAGNOSIS — E559 Vitamin D deficiency, unspecified: Secondary | ICD-10-CM

## 2021-12-17 DIAGNOSIS — F418 Other specified anxiety disorders: Secondary | ICD-10-CM

## 2021-12-17 DIAGNOSIS — F988 Other specified behavioral and emotional disorders with onset usually occurring in childhood and adolescence: Secondary | ICD-10-CM

## 2021-12-17 DIAGNOSIS — F0632 Mood disorder due to known physiological condition with major depressive-like episode: Secondary | ICD-10-CM

## 2021-12-17 DIAGNOSIS — F5105 Insomnia due to other mental disorder: Secondary | ICD-10-CM

## 2021-12-17 DIAGNOSIS — E7800 Pure hypercholesterolemia, unspecified: Secondary | ICD-10-CM

## 2021-12-17 NOTE — Progress Notes (Addendum)
[facility]  PATIENT: [name] DOB: [birth year]  REFERRING DOCTOR OR PCP:  Dr. Jennings Books  [facility]) SOURCE: Patient, a couple MRI reports and images on PACS. Lab reports, notes from Dr. Brigitte Pulse  _________________________________   HISTORICAL  CHIEF COMPLAINT:  Chief Complaint  Patient presents with   Follow-up    Pt in room #10 and alone. Pt here today for f/u with her [place] for her MS. Pt states her leg has been hurt and while she driving.    HISTORY OF PRESENT ILLNESS:  [name] is a 41 y.o. woman with relapsing remitting multiple sclerosis.     Update 12/17/2021: She notes more stress trying to finish up grad school and neding to drive more - she has right leg weakness which makes this difficult.     She had her last Ocrevus August 23 and next one scheduled in Feb 24.  She has no definite exacerbations.   MRI of the brain 09/06/2020 showed  a focus within the left middle cerebellar peduncle and a couple small foci in the periventricular white matter.  None of these enhance or appear to be acute.  Compared to the MRI dated 09/24/2017, there were no new lesions.    She is walking about the same with some reduced balance.   She has some some right leg weakness and spasticity.   She stumbles some.  She has a right foot drop. She also takes baclofen (10 mg in am and 30 mg at night)  for spasticity with benefit.       Mood is doing well on Zoloft 200 mg and Wellbutrin 150 mg/d. Marland Kitchen  She also sees [employer].  She notes mild cognitive issues - especially more difficulty with verbal processing.   She  has some word finding errors and word substitution.     She plays piano and guitar and has some errors when she reads music.  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   Due to a national shortage in Adderall XR,  she has been out thos month  She has insomnia helped by trazodone.  PSG showed snoring but not significant OSA.  She had sparse N3 (4.9%)  Migraine headaches have been well on topiramate.   She had Covid last year and again last month. The second time was mild while the first time wiped her out x weeks.  She also has sinus issues and will be seeing ENT.    MS history:   In late June or early July 2011, she had the onset of numbness that went up to her belly button. She presented to the emergency room. An MRI of the brain showed 1 periventricular focus with maybe a second subtle periventricular focus. An MRI of the cervical spine showed an enhancing lesion adjacent to C3-C4. The diagnosis of possible MS was made but the evidence was not enough to begin a disease modifying therapy. In 2015, she had the onset of similar symptoms and also had decreased ability to use the left hand. An MRI of the brain at that time showed several new lesions not present in 2011, including a focus in the right middle cerebellar peduncle additionally, she had a lumbar puncture consistent with MS. Because of her symptoms, MRI changes and lumbar puncture results, she was diagnosed with clinical definite relapsing remitting  MS and was started on Tecfidera. She has continued on Tecfidera. Over the past few months she has noted more difficulty with focus and concentration.     She also has had some change in vision more recently. Because of these newer symptoms, and MRI was repeated 04/27/2015. The MRI of the cervical spine showed no new lesions. though the MRI of the brain showed 2  lesions not present in 2015, one in the left juxtacortical white matter and one in the left deep white matter.  She had a small exacerbation in August 2017 and received IV SOlu-Medrol.   MRI cervical spine 2017 an MRI of the brain show two spinal plaques, a definite rightt middle cerebellar peduncle plaque and a possible right pontine plaque and several foci  in the hemispheres  MRI 01/2016 showed no new lesions.  She is JCV Ab positive.  She had an exacerbation in 2017 and early 2018 switch to ocrelizumab with her first doses in May 2018 (was on Gilen[place]).   She had another small exacerbation in July (right leg stiff/weak) and she received 3 days of IV site.  She went from using a cane to a walker for balance.  She tolerates the ocrelizumab well.     FH:  Her mom has MS and her MS stabilized after a bone marrow transplant for cancer.  MRI images MRI of the brain 09/24/2017 showed T2 hyperintense foci in the right middle cerebellar peduncle and hemispheres.  There were no changes compared to 09/08/2017.  MRI of the cervical spine 09/26/2017 showed foci within the spinal cord adjacent to C4 and C6-C7.  They were both present on an MRI from 04/04/2016.  There is mild spinal stenosis at C5-C6 due to disc protrusion.  MRI of the brain 09/06/2020 showed  a focus within the left middle cerebellar peduncle and a couple small foci in the periventricular white matter.  None of these enhance or appear to be acute.  Compared to the MRI dated 09/24/2017, there are no new lesions.  These are consistent with chronic demyelinating plaque associated with multiple sclerosis   REVIEW OF SYSTEMS: Constitutional: No fevers, chills, sweats, or change in appetite.  She has fatigue and insomnia. Eyes: No visual changes, double vision, eye pain Ear, nose and throat: No hearing loss, ear pain, nasal congestion, sore throat Cardiovascular: No chest pain, palpitations Respiratory:  No shortness of breath at rest or with exertion.   No wheezes GastrointestinaI: No nausea, vomiting, diarrhea, abdominal pain, fecal incontinence Genitourinary:    She notes frequency, some hesitancy at times and nocturia. Musculoskeletal:  No neck pain, back pain Integumentary: No rash, pruritus, skin lesions Neurological: as above Psychiatric: as above Endocrine: No palpitations, diaphoresis, change in  appetite, change in weigh or increased thirst Hematologic/Lymphatic:  No anemia, purpura, petechiae. Allergic/Immunologic: No itchy/runny eyes, nasal congestion, recent allergic reactions, rashes  ALLERGIES: Allergies  Allergen Reactions   Flexeril [Cyclobenzaprine] Other (See Comments)    Drowsy when taking during the day     HOME MEDICATIONS:  Current Outpatient Medications:    albuterol (VENTOLIN HFA) 108 (90 Base) MCG/ACT inhaler, TAKE 2 PUFFS BY MOUTH EVERY 6 HOURS AS NEEDED FOR WHEEZE OR SHORTNESS OF BREATH, Disp: 18 each, Rfl: 0   amphetamine-dextroamphetamine (ADDERALL XR) 30 MG 24 hr capsule, Take 1 capsule (30 mg total) by mouth daily., Disp: 30 capsule, Rfl: 0   baclofen (LIORESAL) 10 MG tablet, TAKE 1 TABLET BY MOUTH FOUR TIMES A DAY AS NEEDED FOR MUSCLE SPASMS,  Disp: 360 tablet, Rfl: 1   buPROPion (WELLBUTRIN XL) 150 MG 24 hr tablet, Take 1 tablet (150 mg total) by mouth daily., Disp: 30 tablet, Rfl: 12   Cholecalciferol 25 MCG (1000 UT) tablet, Take 5,000 Units by mouth daily. , Disp: , Rfl:    fluticasone (FLONASE) 50 MCG/ACT nasal spray, SPRAY 2 SPRAYS INTO EACH NOSTRIL EVERY DAY, Disp: 16 mL, Rfl: 2   ocrelizumab 600 mg in sodium chloride 0.9 % 500 mL, Inject 600 mg into the vein every 6 (six) months., Disp: , Rfl:    propranolol (INDERAL) 10 MG tablet, Take 1 tablet (10 mg total) by mouth daily as needed. For severe anxiety attack, Disp: 30 tablet, Rfl: 12   sertraline (ZOLOFT) 100 MG tablet, Take 2 tablets (200 mg total) by mouth daily., Disp: 60 tablet, Rfl: 12   topiramate (TOPAMAX) 100 MG tablet, TAKE 1 TABLET BY MOUTH EVERYDAY AT BEDTIME, Disp: 90 tablet, Rfl: 4   traZODone (DESYREL) 100 MG tablet, Take 1 tablet (100 mg total) by mouth at bedtime as needed for sleep., Disp: 90 tablet, Rfl: 1  PAST MEDICAL HISTORY: Past Medical History:  Diagnosis Date   Depression    Migraine    MS (multiple sclerosis) ([place])    Multiple sclerosis ([place])    Spinal stenosis      PAST SURGICAL HISTORY: Past Surgical History:  Procedure Laterality Date   NO PAST SURGERIES      FAMILY HISTORY: Family History  Problem Relation Age of Onset   Multiple myeloma Mother    Multiple sclerosis Mother    Obesity Sister    Anxiety disorder Daughter    Multiple sclerosis Maternal Aunt     SOCIAL HISTORY:  Social History   Socioeconomic History   Marital status: Married    Spouse name: Psychiatric nurse   Number of children: 3   Years of education: Not on file   Highest education level: Bachelor's degree (e.g., BA, AB, BS)  Occupational History   Not on file  Tobacco Use   Smoking status: Never   Smokeless tobacco: Never  Vaping Use   Vaping Use: Never used  Substance and Sexual Activity   Alcohol use: Not Currently    Alcohol/week: 0.0 standard drinks of alcohol    Comment: occasional/fim   Drug use: No   Sexual activity: Yes    Birth control/protection: I.U.D.  Other Topics Concern   Not on file  Social History Narrative   Not on file   Social Determinants of Health   Financial Resource Strain: Low Risk  (05/21/2018)   Overall Financial Resource Strain (CARDIA)    Difficulty of Paying Living Expenses: Not hard at all  Food Insecurity: No Food Insecurity (05/21/2018)   Hunger Vital Sign    Worried About Running Out of Food in the Last Year: Never true    Ran Out of Food in the Last Year: Never true  Transportation Needs: No Transportation Needs (05/21/2018)   PRAPARE - Hydrologist (Medical): No    Lack of Transportation (Non-Medical): No  Physical Activity: Inactive (05/21/2018)   Exercise Vital Sign    Days of Exercise per Week: 0 days    Minutes of Exercise per Session: 0 min  Stress: No Stress Concern Present (05/21/2018)   [place]    Feeling of Stress : Only a little  Social Connections: Unknown (05/21/2018)   Social Connection and Isolation  Panel [  NHANES]    Frequency of Communication with Friends and Family: Not on file    Frequency of Social Gatherings with Friends and Family: Not on file    Attends Religious Services: More than 4 times per year    Active Member of [employer] or Organizations: No    Attends Archivist Meetings: Never    Marital Status: Married  Human resources officer Violence: Not At Risk (05/21/2018)   Humiliation, Afraid, Rape, and Kick questionnaire    Fear of Current or Ex-Partner: No    Emotionally Abused: No    Physically Abused: No    Sexually Abused: No     PHYSICAL EXAM  Vitals:   12/17/21 1107  BP: 135/88  Pulse: 99  Weight: 214 lb 8 oz (97.3 kg)  Height: _0  (1.575 m)    Body mass index is 39.23 kg/m.   General: The patient is well-developed and well-nourished and in no acute distress    Musculoskeletal:    The neck is nontender with a good range of motion.  Neurologic Exam  Mental status: The patient is alert and oriented x 3 at the time of the examination. The patient has apparent normal recent and remote memory, with an apparently normal attention span and concentration ability.   Speech is normal.  Cranial nerves: Extraocular movements are full.  Facial strength and sensation is normal.  Trapezius strength is normal.  The tongue is midline, and the patient has symmetric elevation of the soft palate. No obvious hearing deficits are noted.  Motor:  Muscle bulk is normal.   Tone is mildly increased in legs, fairly equally.  Strength is  5 / 5 in all 4 extremities except 4+/5 in the feet.   Sensory: Sensory testing shows reduced vibration in the right leg and allodynia to touch in right leg.    She has normal sensation in the arms.  Coordination: Cerebellar testing shows good finger-nose-finger but reduced heel-to-shin slightly worse on the right than left.     Gait and station: Station is normal.  She has a mild right foot drop.  Tandem gait is poor.  Romberg is  negative.  Reflexes: Deep tendon reflexes are symmetric and increased in her legs. She has crossed adductor responses at the knees.       DIAGNOSTIC DATA (LABS, IMAGING, TESTING) - I reviewed patient records, labs, notes, testing and imaging myself where available.  Lab Results  Component Value Date   WBC 8.3 06/18/2021   HGB 14.0 06/18/2021   HCT 42.7 06/18/2021   MCV 82 06/18/2021   PLT 433 06/18/2021      Component Value Date/Time   NA 142 06/06/2021 0956   NA 141 07/28/2013 0410   K 4.5 06/06/2021 0956   K 3.7 07/28/2013 0410   CL 103 06/06/2021 0956   CL 103 07/28/2013 0410   CO2 25 06/06/2021 0956   CO2 29 07/28/2013 0410   GLUCOSE 89 06/06/2021 0956   GLUCOSE 102 (H) 12/21/2020 0940   GLUCOSE 121 (H) 07/28/2013 0410   BUN 9 06/06/2021 0956   BUN 16 07/28/2013 0410   CREATININE 0.78 06/06/2021 0956   CREATININE 0.66 07/28/2013 0410   CALCIUM 9.3 06/06/2021 0956   CALCIUM 8.6 07/28/2013 0410   PROT 6.7 12/06/2020 1004   PROT 6.7 07/25/2013 0410   ALBUMIN 4.7 12/06/2020 1004   ALBUMIN 3.3 (L) 07/25/2013 0410   AST 11 12/06/2020 1004   AST 20 07/25/2013 0410   ALT 10 12/06/2020  1004   ALT 18 07/25/2013 0410   ALKPHOS 127 (H) 12/06/2020 1004   ALKPHOS 49 07/25/2013 0410   BILITOT <0.2 12/06/2020 1004   BILITOT 0.4 07/25/2013 0410   GFRNONAA >60 12/21/2020 0940   GFRNONAA >60 07/28/2013 0410   GFRAA 108 12/06/2019 0955   GFRAA >60 07/28/2013 0410        ASSESSMENT AND PLAN  Relapsing remitting multiple sclerosis (HCC)  High risk medication use  Gait disturbance  Attention deficit disorder, unspecified hyperactivity presence  Migraine without aura and without status migrainosus, not intractable  Insomnia due to mental disorder  Depression with anxiety   1.   Continue Ocrevus.     IgG/IgM and CBC with differential were fine with the last visit.  This will need to be rechecked again at the next visit.  I will hold off on checking an MRI of the  brain until 2024. 2.   Continue trazodone for insomnia.   3.   Stay active and exercise as tolerated. 4.   Continue Adderall 30 mg every morning. 5.   Return in 6 months or sooner if there are new or worsening neurologic symptoms.  40-minute office visit with the majority of the time spent face-to-face for history and physical, discussion/counseling and decision-making.  Additional time with record review and documentation.   Dencil Cayson A. Felecia Shelling, MD, [facility] 36/07/7701, 4:03 PM Certified in Neurology, Clinical Neurophysiology, Sleep Medicine and Neuroimaging  [facility] [address] [place], [place] [address] [phone] olll

## 2021-12-18 ENCOUNTER — Encounter: Payer: Self-pay | Admitting: Nurse Practitioner

## 2021-12-18 LAB — CBC WITH DIFFERENTIAL/PLATELET
Basophils Absolute: 0.1 10*3/uL (ref 0.0–0.2)
Basos: 1 %
EOS (ABSOLUTE): 0.3 10*3/uL (ref 0.0–0.4)
Eos: 3 %
Hematocrit: 40.6 % (ref 34.0–46.6)
Hemoglobin: 13.5 g/dL (ref 11.1–15.9)
Immature Grans (Abs): 0 10*3/uL (ref 0.0–0.1)
Immature Granulocytes: 0 %
Lymphocytes Absolute: 1.8 10*3/uL (ref 0.7–3.1)
Lymphs: 22 %
MCH: 26.6 pg (ref 26.6–33.0)
MCHC: 33.3 g/dL (ref 31.5–35.7)
MCV: 80 fL (ref 79–97)
Monocytes Absolute: 0.7 10*3/uL (ref 0.1–0.9)
Monocytes: 8 %
Neutrophils Absolute: 5.5 10*3/uL (ref 1.4–7.0)
Neutrophils: 66 %
Platelets: 466 10*3/uL — ABNORMAL HIGH (ref 150–450)
RBC: 5.07 x10E6/uL (ref 3.77–5.28)
RDW: 14.2 % (ref 11.7–15.4)
WBC: 8.3 10*3/uL (ref 3.4–10.8)

## 2021-12-18 LAB — LIPID PANEL W/O CHOL/HDL RATIO
Cholesterol, Total: 196 mg/dL (ref 100–199)
HDL: 53 mg/dL (ref >39)
LDL Chol Calc (NIH): 107 mg/dL — ABNORMAL HIGH (ref 0–99)
Triglycerides: 209 mg/dL — ABNORMAL HIGH (ref 0–149)
VLDL Cholesterol Cal: 36 mg/dL (ref 5–40)

## 2021-12-18 LAB — COMPREHENSIVE METABOLIC PANEL
ALT: 16 IU/L (ref 0–32)
AST: 14 IU/L (ref 0–40)
Albumin/Globulin Ratio: 1.8 (ref 1.2–2.2)
Albumin: 4.4 g/dL (ref 3.9–4.9)
Alkaline Phosphatase: 145 IU/L — ABNORMAL HIGH (ref 44–121)
BUN/Creatinine Ratio: 21 (ref 9–23)
BUN: 15 mg/dL (ref 6–24)
Bilirubin Total: <0.2 mg/dL (ref 0.0–1.2)
CO2: 23 mmol/L (ref 20–29)
Calcium: 9.2 mg/dL (ref 8.7–10.2)
Chloride: 107 mmol/L — ABNORMAL HIGH (ref 96–106)
Creatinine, Ser: 0.7 mg/dL (ref 0.57–1.00)
Globulin, Total: 2.4 g/dL (ref 1.5–4.5)
Glucose: 85 mg/dL (ref 70–99)
Potassium: 4 mmol/L (ref 3.5–5.2)
Sodium: 143 mmol/L (ref 134–144)
Total Protein: 6.8 g/dL (ref 6.0–8.5)
eGFR: 110 mL/min/{1.73_m2} (ref >59)

## 2021-12-18 LAB — TSH: TSH: 2.14 u[IU]/mL (ref 0.450–4.500)

## 2021-12-18 LAB — VITAMIN D 25 HYDROXY (VIT D DEFICIENCY, FRACTURES): Vit D, 25-Hydroxy: 46.7 ng/mL (ref 30.0–100.0)

## 2021-12-18 NOTE — Progress Notes (Signed)
Contacted via MyChart   Good afternoon [name] your labs have returned and overall look stable.  Cholesterol labs remain elevated in LDL, but continued recommendations to work on diet and exercise.  No medication changes needed at this time.  Any questions? Keep being amazing!!  Thank you for allowing me to participate in your care.  I appreciate you. Kindest regards, Cataleia Gade

## 2022-01-10 ENCOUNTER — Other Ambulatory Visit: Payer: Self-pay | Admitting: Family Medicine

## 2022-01-10 MED ORDER — AMPHETAMINE-DEXTROAMPHET ER 30 MG PO CP24: 30.0000 mg | ORAL_CAPSULE | Freq: Every day | ORAL | 0 refills | Status: DC

## 2022-02-10 ENCOUNTER — Other Ambulatory Visit: Payer: Self-pay | Admitting: Family Medicine

## 2022-02-12 MED ORDER — AMPHETAMINE-DEXTROAMPHET ER 30 MG PO CP24: 30.0000 mg | ORAL_CAPSULE | Freq: Every day | ORAL | 0 refills | Status: DC

## 2022-02-25 ENCOUNTER — Other Ambulatory Visit: Payer: Self-pay | Admitting: Neurology

## 2022-03-18 ENCOUNTER — Other Ambulatory Visit: Payer: Self-pay | Admitting: Family Medicine

## 2022-03-19 MED ORDER — AMPHETAMINE-DEXTROAMPHET ER 30 MG PO CP24: 30.0000 mg | ORAL_CAPSULE | Freq: Every day | ORAL | 0 refills | Status: DC

## 2022-03-23 ENCOUNTER — Encounter: Payer: Self-pay | Admitting: Nurse Practitioner

## 2022-03-24 ENCOUNTER — Encounter: Payer: Self-pay | Admitting: Neurology

## 2022-03-25 ENCOUNTER — Encounter: Payer: BC Managed Care – PPO | Admitting: Nurse Practitioner

## 2022-03-25 DIAGNOSIS — R269 Unspecified abnormalities of gait and mobility: Secondary | ICD-10-CM

## 2022-03-25 MED ORDER — NIRMATRELVIR/RITONAVIR (PAXLOVID)TABLET: 3.0000 | ORAL_TABLET | Freq: Two times a day (BID) | ORAL | 0 refills | Status: AC

## 2022-03-25 NOTE — Progress Notes (Signed)
Error, 

## 2022-03-26 ENCOUNTER — Telehealth: Payer: BC Managed Care – PPO | Admitting: Nurse Practitioner

## 2022-03-26 ENCOUNTER — Encounter: Payer: Self-pay | Admitting: Nurse Practitioner

## 2022-03-26 DIAGNOSIS — U071 COVID-19: Secondary | ICD-10-CM

## 2022-03-26 NOTE — Progress Notes (Signed)
There were no vitals taken for this visit.   Subjective:    Patient ID: [name], female    DOB: 1980/03/27, 42 y.o.   MRN: [id number]  HPI: [name] is a 42 y.o. female  Chief Complaint  Patient presents with   Covid Positive    Tested positive on Saturday, symptoms, congestion, stuffy nose and cough, headache, chills, fatigue, nausea. Have been taking ibuprofen, Dayquil and Nyquil. Patient picked up Paxlovid yesterday, started taking last night.    This visit was completed via video visit through MyChart due to the restrictions of the COVID-19 pandemic. All issues as above were discussed and addressed. Physical exam was done as above through visual confirmation on video through MyChart. If it was felt that the patient should be evaluated in the office, they were directed there. The patient verbally consented to this visit. Location of the patient: home Location of the provider: work Those involved with this call:  Provider: Marnee Guarneri, DNP CMA: Frazier Butt, [place] Desk/Registration: [employer]  Time spent on call:  21 minutes with patient face to face via video conference. More than 50% of this time was spent in counseling and coordination of care. 15 minutes total spent in review of patient's record and preparation of their chart.  I verified patient identity using two factors (patient name and date of birth). Patient consents verbally to being seen via telemedicine visit today.    COVID POSITIVE Started Paxlovid last night.  Started with symptoms on Saturday and tested positive for Covid that day.  She is Covid vaccinated.  Current treatments for MS, neurology was okay with her taking Paxlovid. Fever: chills & muscle aches Cough: yes Shortness of breath: no Wheezing: no Chest pain: no Chest tightness: no Chest congestion: no Nasal congestion: yes Runny nose: yes Post nasal drip: yes Sneezing: no Sore throat: only a  little Swollen glands: no Sinus pressure: yes Headache: yes Face pain: yes Toothache: no Ear pain: no Ear pressure: no Eyes red/itching:no Eye drainage/crusting: no  Vomiting: reduced appetite today Rash: no Fatigue: yes Sick contacts: yes -- three coworkers have Covid Strep contacts: no  Context: stable Recurrent sinusitis: no Relief with OTC cold/cough medications: yes  Treatments attempted: Paxlovid, Dayquil/Nyquil    Relevant past medical, surgical, family and social history reviewed and updated as indicated. Interim medical history since our last visit reviewed. Allergies and medications reviewed and updated.  Review of Systems  Constitutional:  Positive for chills and fatigue. Negative for activity change, appetite change and fever.  HENT:  Positive for congestion, postnasal drip, rhinorrhea, sinus pressure and sore throat. Negative for ear discharge, ear pain, facial swelling, sinus pain, sneezing and voice change.   Eyes:  Negative for pain and visual disturbance.  Respiratory:  Positive for cough and shortness of breath. Negative for chest tightness and wheezing.   Cardiovascular:  Negative for chest pain, palpitations and leg swelling.  Gastrointestinal:  Positive for diarrhea.  Endocrine: Negative.   Musculoskeletal:  Positive for myalgias.  Neurological:  Positive for headaches. Negative for dizziness and numbness.  Psychiatric/Behavioral: Negative.      Per HPI unless specifically indicated above     Objective:    There were no vitals taken for this visit.  Wt Readings from Last 3 Encounters:  12/17/21 214 lb 8 oz (97.3 kg)  12/14/21 213 lb 1.6 oz (96.7 kg)  11/13/21 208 lb 9.6 oz (94.6 kg)    Physical Exam Vitals and nursing note  reviewed.  Constitutional:      General: She is awake. She is not in acute distress.    Appearance: She is well-developed and well-groomed. She is ill-appearing. She is not toxic-appearing.  HENT:     Head: Normocephalic.      Right Ear: Hearing normal.     Left Ear: Hearing normal.  Eyes:     General: Lids are normal.        Right eye: No discharge.        Left eye: No discharge.     Conjunctiva/sclera: Conjunctivae normal.  Pulmonary:     Effort: Pulmonary effort is normal. No accessory muscle usage or respiratory distress.  Musculoskeletal:     Cervical back: Normal range of motion.  Neurological:     Mental Status: She is alert and oriented to person, place, and time.  Psychiatric:        Attention and Perception: Attention normal.        Mood and Affect: Mood normal.        Behavior: Behavior normal. Behavior is cooperative.        Thought Content: Thought content normal.        Judgment: Judgment normal.     Results for orders placed or performed in visit on 12/17/21  VITAMIN D 25 Hydroxy (Vit-D Deficiency, Fractures)  Result Value Ref Range   Vit D, 25-Hydroxy 46.7 30.0 - 100.0 ng/mL  TSH  Result Value Ref Range   TSH 2.140 0.450 - 4.500 uIU/mL  Lipid Panel w/o Chol/HDL Ratio  Result Value Ref Range   Cholesterol, Total 196 100 - 199 mg/dL   Triglycerides 209 (H) 0 - 149 mg/dL   HDL 53 >39 mg/dL   VLDL Cholesterol Cal 36 5 - 40 mg/dL   LDL Chol Calc (NIH) 107 (H) 0 - 99 mg/dL  Comprehensive metabolic panel  Result Value Ref Range   Glucose 85 70 - 99 mg/dL   BUN 15 6 - 24 mg/dL   Creatinine, Ser 0.70 0.57 - 1.00 mg/dL   eGFR 110 >59 mL/min/1.73   BUN/Creatinine Ratio 21 9 - 23   Sodium 143 134 - 144 mmol/L   Potassium 4.0 3.5 - 5.2 mmol/L   Chloride 107 (H) 96 - 106 mmol/L   CO2 23 20 - 29 mmol/L   Calcium 9.2 8.7 - 10.2 mg/dL   Total Protein 6.8 6.0 - 8.5 g/dL   Albumin 4.4 3.9 - 4.9 g/dL   Globulin, Total 2.4 1.5 - 4.5 g/dL   Albumin/Globulin Ratio 1.8 1.2 - 2.2   Bilirubin Total <0.2 0.0 - 1.2 mg/dL   Alkaline Phosphatase 145 (H) 44 - 121 IU/L   AST 14 0 - 40 IU/L   ALT 16 0 - 32 IU/L  CBC with Differential/Platelet  Result Value Ref Range   WBC 8.3 3.4 - 10.8 x10E3/uL    RBC 5.07 3.77 - 5.28 x10E6/uL   Hemoglobin 13.5 11.1 - 15.9 g/dL   Hematocrit 40.6 34.0 - 46.6 %   MCV 80 79 - 97 fL   MCH 26.6 26.6 - 33.0 pg   MCHC 33.3 31.5 - 35.7 g/dL   RDW 14.2 11.7 - 15.4 %   Platelets 466 (H) 150 - 450 x10E3/uL   Neutrophils 66 Not Estab. %   Lymphs 22 Not Estab. %   Monocytes 8 Not Estab. %   Eos 3 Not Estab. %   Basos 1 Not Estab. %   Neutrophils Absolute 5.5 1.4 - 7.0  x10E3/uL   Lymphocytes Absolute 1.8 0.7 - 3.1 x10E3/uL   Monocytes Absolute 0.7 0.1 - 0.9 x10E3/uL   EOS (ABSOLUTE) 0.3 0.0 - 0.4 x10E3/uL   Basophils Absolute 0.1 0.0 - 0.2 x10E3/uL   Immature Granulocytes 0 Not Estab. %   Immature Grans (Abs) 0.0 0.0 - 0.1 x10E3/uL      Assessment & Plan:   Problem List Items Addressed This Visit       Other   Lab test positive for detection of COVID-19 virus - Primary    Acute since 03/23/22 -- started on Paxlovid on 03/25/22.  Tolerating well.  Recommend completed full course.  Self quarantine until 03/27/22 and then recommend masking for 5 days total.  Do not retest.  Recommend: - Increased rest - Increasing Fluids - Acetaminophen as needed for fever/pain.  - Salt water gargling, chloraseptic spray and throat lozenges - OTC Dayquil/Nyquil - Mucinex.  - Humidifying the air.  Return for worsening or ongoing symptoms.       I discussed the assessment and treatment plan with the patient. The patient was provided an opportunity to ask questions and all were answered. The patient agreed with the plan and demonstrated an understanding of the instructions.   The patient was advised to call back or seek an in-person evaluation if the symptoms worsen or if the condition fails to improve as anticipated.   I provided 21+ minutes of time during this encounter.    Follow up plan: Return if symptoms worsen or fail to improve.

## 2022-03-26 NOTE — Patient Instructions (Signed)

## 2022-03-26 NOTE — Assessment & Plan Note (Signed)
Acute since 03/23/22 -- started on Paxlovid on 03/25/22.  Tolerating well.  Recommend completed full course.  Self quarantine until 03/27/22 and then recommend masking for 5 days total.  Do not retest.  Recommend: - Increased rest - Increasing Fluids - Acetaminophen as needed for fever/pain.  - Salt water gargling, chloraseptic spray and throat lozenges - OTC Dayquil/Nyquil - Mucinex.  - Humidifying the air.  Return for worsening or ongoing symptoms.

## 2022-03-27 ENCOUNTER — Ambulatory Visit: Payer: BC Managed Care – PPO | Admitting: Nurse Practitioner

## 2022-04-19 ENCOUNTER — Other Ambulatory Visit: Payer: Self-pay | Admitting: Family Medicine

## 2022-04-19 NOTE — Telephone Encounter (Signed)
Pt last seen on 12/17/2021 per note "4.   Continue Adderall 30 mg every morning. " Follow up scheduled on 06/18/22 Rx last filled on 03/19/22 # 30 tablets Rx pending to be signed

## 2022-04-22 MED ORDER — AMPHETAMINE-DEXTROAMPHET ER 30 MG PO CP24: 30.0000 mg | ORAL_CAPSULE | Freq: Every day | ORAL | 0 refills | Status: DC

## 2022-04-27 ENCOUNTER — Ambulatory Visit
Admission: RE | Admit: 2022-04-27 | Discharge: 2022-04-27 | Disposition: A | Discharge status: Home / Self Care | Payer: BC Managed Care – PPO | Source: Ambulatory Visit | Attending: Emergency Medicine | Admitting: Emergency Medicine

## 2022-04-27 ENCOUNTER — Ambulatory Visit (INDEPENDENT_AMBULATORY_CARE_PROVIDER_SITE_OTHER): Payer: BC Managed Care – PPO

## 2022-04-27 VITALS — BP 116/79 | HR 97 | Temp 97.7°F | Resp 18 | Ht 62.5 in | Wt 210.0 lb

## 2022-04-27 DIAGNOSIS — M25461 Effusion, right knee: Secondary | ICD-10-CM

## 2022-04-27 DIAGNOSIS — M25561 Pain in right knee: Secondary | ICD-10-CM | POA: Diagnosis not present

## 2022-04-27 NOTE — ED Provider Notes (Signed)
Roderic Palau    CSN: [id number] Arrival date & time: 04/27/22  1403      History   Chief Complaint Chief Complaint  Patient presents with   Knee Injury    I am unsure if I have injured my knee again or if I am just in pain. Pain level is a 7/10. I am unable to bend or extend my leg fully. Putting weight on that leg is also  hard. - Entered by patient    HPI [name] is a 42 y.o. female.  Patient presents with right knee pain x 1 week.  No known injury.  The pain is worse with movement and ambulation.  No wounds, bruising, redness, numbness, unusual weakness, fever, or other symptoms. Treating with ice packs.  No OTC medications taken.  Her medical history includes multiple sclerosis, depression, migraine headache, ADD, anxiety, insomnia.   The history is provided by the patient and medical records.    Past Medical History:  Diagnosis Date   Depression    Migraine    MS (multiple sclerosis) ([place])    Multiple sclerosis ([place])    Spinal stenosis     Patient Active Problem List   Diagnosis Date Noted   Lab test positive for detection of COVID-19 virus 03/26/2022   Right bundle branch block (RBBB) 11/13/2021   Elevated low density lipoprotein (LDL) cholesterol level 06/07/2020   Excessive daytime sleepiness 02/28/2020   Vitamin D deficiency 12/06/2019   Family history of breast cancer 12/06/2019   Migraine without aura and without status migrainosus, not intractable 08/25/2019   Obesity 06/04/2019   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    Obstetric Comments  IUD - placed 2016          Home Medications    Prior to Admission medications   Medication Sig Start Date End Date Taking? Authorizing Provider  albuterol (VENTOLIN HFA) 108 (90 Base) MCG/ACT inhaler TAKE 2 PUFFS BY MOUTH EVERY 6 HOURS AS NEEDED FOR WHEEZE OR SHORTNESS OF BREATH 03/09/20   Cannady, Jolene T, NP  amphetamine-dextroamphetamine (ADDERALL XR) 30 MG 24 hr capsule Take 1 capsule (30 mg total) by mouth daily. 04/22/22   Lomax, Amy, NP  baclofen (LIORESAL) 10 MG tablet TAKE 1 TABLET BY MOUTH FOUR TIMES DAILY AS NEEDED FOR MUSCLE SPASMS 02/25/22   Sater, Nanine Means, MD  buPROPion (WELLBUTRIN XL) 150 MG 24 hr tablet Take 1 tablet (150 mg total) by mouth daily. 12/14/21   Marnee Guarneri T, NP  Cholecalciferol 25 MCG (1000 UT) tablet Take 5,000 Units by mouth daily.     [provider]  fluticasone (FLONASE) 50 MCG/ACT nasal spray SPRAY 2 SPRAYS INTO EACH NOSTRIL EVERY DAY 05/29/21   Cannady, Henrine Screws T, NP  ocrelizumab 600 mg in sodium chloride 0.9 % 500 mL Inject 600 mg into the vein every 6 (six) months.    [provider]  propranolol (INDERAL) 10 MG tablet Take 1 tablet (10 mg total) by mouth daily  as needed. For severe anxiety attack 12/14/21   Marnee Guarneri T, NP  sertraline (ZOLOFT) 100 MG tablet Take 2 tablets (200 mg total) by mouth daily. 12/14/21   Cannady, Henrine Screws T, NP  topiramate (TOPAMAX) 100 MG tablet TAKE 1 TABLET BY MOUTH EVERYDAY AT BEDTIME 06/06/21   Cannady, Jolene T, NP  traZODone (DESYREL) 100 MG tablet Take 1 tablet (100 mg total) by mouth at bedtime as needed for sleep. 12/03/21 11/28/22  Sater, Nanine Means, MD    Family History Family History  Problem Relation Age of Onset   Multiple myeloma Mother    Multiple sclerosis Mother    Obesity Sister    Anxiety disorder Daughter    Multiple sclerosis Maternal Aunt     Social  History Social History   Tobacco Use   Smoking status: Never   Smokeless tobacco: Never  Vaping Use   Vaping Use: Never used  Substance Use Topics   Alcohol use: Not Currently    Alcohol/week: 0.0 standard drinks of alcohol    Comment: occasional/fim   Drug use: No     Allergies   Flexeril [cyclobenzaprine]   Review of Systems Review of Systems  Constitutional:  Negative for chills and fever.  Musculoskeletal:  Positive for arthralgias, gait problem and joint swelling.  Skin:  Negative for color change, rash and wound.  Neurological:  Negative for weakness and numbness.  All other systems reviewed and are negative.    Physical Exam Triage Vital Signs ED Triage Vitals  Enc Vitals Group     BP      Pulse      Resp      Temp      Temp src      SpO2      Weight      Height      Head Circumference      Peak Flow      Pain Score      Pain Loc      Pain Edu?      Excl. in [place]?    No data found.  Updated Vital Signs BP 116/79   Pulse 97   Temp 97.7 F (36.5 C)   Resp 18   Ht 5' 2.5" (1.588 m)   Wt 210 lb (95.3 kg)   SpO2 98%   BMI 37.80 kg/m   Visual Acuity Right Eye Distance:   Left Eye Distance:   Bilateral Distance:    Right Eye Near:   Left Eye Near:    Bilateral Near:     Physical Exam Vitals and nursing note reviewed.  Constitutional:      General: She is not in acute distress.    Appearance: She is well-developed. She is not ill-appearing.  HENT:     Mouth/Throat:     Mouth: Mucous membranes are moist.  Cardiovascular:     Rate and Rhythm: Normal rate and regular rhythm.  Pulmonary:     Effort: Pulmonary effort is normal. No respiratory distress.  Abdominal:     Tenderness: There is no abdominal tenderness.  Musculoskeletal:        General: Swelling and tenderness present. No deformity. Normal range of motion.     Cervical back: Neck supple.     Comments: Generalized tenderness and edema of right knee.   Skin:    General: Skin  is warm and dry.     Capillary Refill: Capillary refill takes less than 2 seconds.     Findings: No  bruising, erythema, lesion or rash.  Neurological:     General: No focal deficit present.     Mental Status: She is alert and oriented to person, place, and time.     Sensory: No sensory deficit.     Motor: No weakness.     Gait: Gait abnormal.     Comments: Ambulatory with walker.   Psychiatric:        Mood and Affect: Mood normal.      UC Treatments / Results  Labs (all labs ordered are listed, but only abnormal results are displayed) Labs Reviewed - No data to display  EKG   Radiology DG Knee Complete 4 Views Right  Result Date: 04/27/2022 CLINICAL DATA:  Right-sided knee pain for 1 week.  No acute injury. EXAM: RIGHT KNEE - COMPLETE 4+ VIEW COMPARISON:  None Available. FINDINGS: No evidence of fracture, dislocation, or joint effusion. No evidence of arthropathy or other focal bone abnormality. Soft tissues are unremarkable. IMPRESSION: Negative. Electronically Signed   By: Zerita Boers M.D.   On: 04/27/2022 14:38    Procedures Procedures (including critical care time)  Medications Ordered in UC Medications - No data to display  Initial Impression / Assessment and Plan / UC Course  I have reviewed the triage vital signs and the nursing notes.  Pertinent labs & imaging results that were available during my care of the patient were reviewed by me and considered in my medical decision making (see chart for details).    Right knee pain and swelling.  Xray negative.  Treating with rest, elevation, ice packs, ibuprofen or Tylenol.  Education provided on knee pain.  Instructed patient to follow-up with orthopedics.  Contact information for on-call Ortho provided.  Patient agrees to plan of care.   Final Clinical Impressions(s) / UC Diagnoses   Final diagnoses:  Pain and swelling of right knee     Discharge Instructions      Take Tylenol or ibuprofen as needed for  discomfort.  Rest and elevate your knee.  Apply ice packs 2-3 times a day for up to 15 minutes each.  Wear the ace wrap as needed for comfort.    Follow up with an orthopedist such as the one listed below.        ED Prescriptions   None    I have reviewed the PDMP during this encounter.   Sharion Balloon, NP 04/27/22 1452

## 2022-04-27 NOTE — Discharge Instructions (Addendum)
Take Tylenol or ibuprofen as needed for discomfort.  Rest and elevate your knee.  Apply ice packs 2-3 times a day for up to 15 minutes each.  Wear the ace wrap as needed for comfort.    Follow up with an orthopedist such as the one listed below.

## 2022-04-27 NOTE — ED Triage Notes (Addendum)
Patient to Urgent Care with complaints of right sided knee pain x1 week. Reports hx of prior injury. Patient also has MS and her right side is her most affected side. Ambulates with a walker.   Reports pain is on the lateral aspect and behind her knee. Reports pain is worse with movement/ bending knee/ putting weight on knee. Denies any known cause for pain.

## 2022-05-21 ENCOUNTER — Other Ambulatory Visit: Payer: Self-pay | Admitting: Family Medicine

## 2022-05-22 MED ORDER — AMPHETAMINE-DEXTROAMPHET ER 30 MG PO CP24: 30.0000 mg | ORAL_CAPSULE | Freq: Every day | ORAL | 0 refills | Status: DC

## 2022-05-22 NOTE — Telephone Encounter (Signed)
Last seen on 12/17/21 per note "4. Continue Adderall 30 mg every morning. " Follow up scheduled on 06/18/22 Rx last filled on 04/22/22 #30 tablets (30 day supply) Rx pending to be signed

## 2022-05-29 ENCOUNTER — Other Ambulatory Visit: Payer: Self-pay | Admitting: Neurology

## 2022-05-29 ENCOUNTER — Other Ambulatory Visit: Payer: Self-pay

## 2022-05-29 DIAGNOSIS — F5105 Insomnia due to other mental disorder: Secondary | ICD-10-CM

## 2022-06-09 NOTE — Patient Instructions (Signed)
Managing Anxiety, Adult After being diagnosed with anxiety, you may be relieved to know why you have felt or behaved a certain way. You may also feel overwhelmed about the treatment ahead and what it will mean for your life. With care and support, you can manage your anxiety. How to manage lifestyle changes Understanding the difference between stress and anxiety Although stress can play a role in anxiety, it is not the same as anxiety. Stress is your body's reaction to life changes and events, both good and bad. Stress is often caused by something external, such as a deadline, test, or competition. It normally goes away after the event has ended and will last just a few hours. But, stress can be ongoing and can lead to more than just stress. Anxiety is caused by something internal, such as imagining a terrible outcome or worrying that something will go wrong that will greatly upset you. Anxiety often does not go away even after the event is over, and it can become a long-term (chronic) worry. Lowering stress and anxiety Talk with your health care provider or a counselor to learn more about lowering anxiety and stress. They may suggest tension-reduction techniques, such as: Music. Spend time creating or listening to music that you enjoy and that inspires you. Mindfulness-based meditation. Practice being aware of your normal breaths while not trying to control your breathing. It can be done while sitting or walking. Centering prayer. Focus on a word, phrase, or sacred image that means something to you and brings you peace. Deep breathing. Expand your stomach and inhale slowly through your nose. Hold your breath for 3-5 seconds. Then breathe out slowly, letting your stomach muscles relax. Self-talk. Learn to notice and spot thought patterns that lead to anxiety reactions. Change those patterns to thoughts that feel peaceful. Muscle relaxation. Take time to tense muscles and then relax them. Choose a  tension-reduction technique that fits your lifestyle and personality. These techniques take time and practice. Set aside 5-15 minutes a day to do them. Specialized therapists can offer counseling and training in these techniques. The training to help with anxiety may be covered by some insurance plans. Other things you can do to manage stress and anxiety include: Keeping a stress diary. This can help you learn what triggers your reaction and then learn ways to manage your response. Thinking about how you react to certain situations. You may not be able to control everything, but you can control your response. Making time for activities that help you relax and not feeling guilty about spending your time in this way. Doing visual imagery. This involves imagining or creating mental pictures to help you relax. Practicing yoga. Through yoga poses, you can lower tension and relax.  Medicines Medicines for anxiety include: Antidepressant medicines. These are usually prescribed for long-term daily control. Anti-anxiety medicines. These may be added in severe cases, especially when panic attacks occur. When used together, medicines, psychotherapy, and tension-reduction techniques may be the most effective treatment. Relationships Relationships can play a big part in helping you recover. Spend more time connecting with trusted friends and family members. Think about going to couples counseling if you have a partner, taking family education classes, or going to family therapy. Therapy can help you and others better understand your anxiety. How to recognize changes in your anxiety Everyone responds differently to treatment for anxiety. Recovery from anxiety happens when symptoms lessen and stop interfering with your daily life at home or work. This may mean that you   will start to: Have better concentration and focus. Worry will interfere less in your daily thinking. Sleep better. Be less irritable. Have more  energy. Have improved memory. Try to recognize when your condition is getting worse. Contact your provider if your symptoms interfere with home or work and you feel like your condition is not improving. Follow these instructions at home: Activity Exercise. Adults should: Exercise for at least 150 minutes each week. The exercise should increase your heart rate and make you sweat (moderate-intensity exercise). Do strengthening exercises at least twice a week. Get the right amount and quality of sleep. Most adults need 7-9 hours of sleep each night. Lifestyle  Eat a healthy diet that includes plenty of vegetables, fruits, whole grains, low-fat dairy products, and lean protein. Do not eat a lot of foods that are high in fats, added sugars, or salt (sodium). Make choices that simplify your life. Do not use any products that contain nicotine or tobacco. These products include cigarettes, chewing tobacco, and vaping devices, such as e-cigarettes. If you need help quitting, ask your provider. Avoid caffeine, alcohol, and certain over-the-counter cold medicines. These may make you feel worse. Ask your pharmacist which medicines to avoid. General instructions Take over-the-counter and prescription medicines only as told by your provider. Keep all follow-up visits. This is to make sure you are managing your anxiety well or if you need more support. Where to find support You can get help and support from: Self-help groups. Online and community organizations. A trusted spiritual leader. Couples counseling. Family education classes. Family therapy. Where to find more information You may find that joining a support group helps you deal with your anxiety. The following sources can help you find counselors or support groups near you: Mental Health America: mentalhealthamerica.net Anxiety and Depression Association of America (ADAA): adaa.org National Alliance on Mental Illness (NAMI): nami.org Contact  a health care provider if: You have a hard time staying focused or finishing tasks. You spend many hours a day feeling worried about everyday life. You are very tired because you cannot stop worrying. You start to have headaches or often feel tense. You have chronic nausea or diarrhea. Get help right away if: Your heart feels like it is racing. You have shortness of breath. You have thoughts of hurting yourself or others. Get help right away if you feel like you may hurt yourself or others, or have thoughts about taking your own life. Go to your nearest emergency room or: Call 911. Call the National Suicide Prevention Lifeline at 1-800-273-8255 or 988. This is open 24 hours a day. Text the Crisis Text Line at 741741. This information is not intended to replace advice given to you by your health care provider. Make sure you discuss any questions you have with your health care provider. Document Revised: 10/30/2021 Document Reviewed: 05/14/2020 Elsevier Patient Education  2023 [employer].  

## 2022-06-13 ENCOUNTER — Encounter: Payer: Self-pay | Admitting: Nurse Practitioner

## 2022-06-14 ENCOUNTER — Encounter: Payer: Self-pay | Admitting: Nurse Practitioner

## 2022-06-14 ENCOUNTER — Telehealth: Payer: Self-pay | Admitting: Nurse Practitioner

## 2022-06-14 ENCOUNTER — Ambulatory Visit: Payer: BC Managed Care – PPO | Admitting: Nurse Practitioner

## 2022-06-14 VITALS — BP 100/60 | HR 82 | Temp 98.0°F | Ht 62.01 in | Wt 208.5 lb

## 2022-06-14 DIAGNOSIS — F5105 Insomnia due to other mental disorder: Secondary | ICD-10-CM

## 2022-06-14 DIAGNOSIS — Z6838 Body mass index (BMI) 38.0-38.9, adult: Secondary | ICD-10-CM

## 2022-06-14 DIAGNOSIS — F411 Generalized anxiety disorder: Secondary | ICD-10-CM

## 2022-06-14 DIAGNOSIS — F0632 Mood disorder due to known physiological condition with major depressive-like episode: Secondary | ICD-10-CM | POA: Diagnosis not present

## 2022-06-14 DIAGNOSIS — G35 Multiple sclerosis: Secondary | ICD-10-CM | POA: Diagnosis not present

## 2022-06-14 DIAGNOSIS — E6609 Other obesity due to excess calories: Secondary | ICD-10-CM

## 2022-06-14 DIAGNOSIS — G43009 Migraine without aura, not intractable, without status migrainosus: Secondary | ICD-10-CM

## 2022-06-14 DIAGNOSIS — B370 Candidal stomatitis: Secondary | ICD-10-CM

## 2022-06-14 DIAGNOSIS — E66812 Obesity, class 2: Secondary | ICD-10-CM

## 2022-06-14 DIAGNOSIS — G4719 Other hypersomnia: Secondary | ICD-10-CM

## 2022-06-14 DIAGNOSIS — F988 Other specified behavioral and emotional disorders with onset usually occurring in childhood and adolescence: Secondary | ICD-10-CM | POA: Diagnosis not present

## 2022-06-14 MED ORDER — NYSTATIN 100000 UNIT/ML MT SUSP: 5.0000 mL | Freq: Four times a day (QID) | OROMUCOSAL | 0 refills | Status: DC

## 2022-06-14 MED ORDER — CLOTRIMAZOLE 10 MG MT TROC: 10.0000 mg | Freq: Every day | OROMUCOSAL | 0 refills | Status: AC

## 2022-06-14 NOTE — Assessment & Plan Note (Signed)
Chronic, stable with Topamax.  Continue collaboration with neurology and recent notes reviewed. 

## 2022-06-14 NOTE — Assessment & Plan Note (Signed)
BMI 38.13.  Recommended eating smaller high protein, low fat meals more frequently and exercising 30 mins a day 5 times a week with a goal of 10-15lb weight loss in the next 3 months. Patient voiced their understanding and motivation to adhere to these recommendations.  

## 2022-06-14 NOTE — Assessment & Plan Note (Signed)
Chronic, ongoing, followed by neurology, continue current medication regimen and collaboration with them. 
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
## 2022-06-14 NOTE — Assessment & Plan Note (Signed)
Chronic, ongoing -- with infusions.  Continue to collaborate with neurology and current treatment. 

## 2022-06-14 NOTE — Telephone Encounter (Signed)
[name] with [facility] states that the medication that was sent over today: nystatin (MYCOSTATIN) 100000 UNIT/ML suspension  Is on long term backorder. [name] is wanting to see if the pt PCP can call in another medication for the pt.

## 2022-06-14 NOTE — Assessment & Plan Note (Signed)
With MS, followed by neurology and psychiatry, continue current medication regimen as prescribed by them. 

## 2022-06-14 NOTE — Assessment & Plan Note (Signed)
Chronic, stable - some increase in anxiety due to recent triggers.  Denies SI/HI.  Followed by psychiatry in past, last note reviewed.  Continue current medication regimen and adjust as needed, PCP has taken over refills.  Continue therapy sessions.

## 2022-06-14 NOTE — Assessment & Plan Note (Signed)
Chronic, stable.  Denies SI/HI.  Followed by psychiatry in past, last note reviewed.  Continue current medication regimen and adjust as needed, PCP has taken over refills.  Continue therapy sessions. 

## 2022-06-14 NOTE — Assessment & Plan Note (Signed)
Chronic, ongoing secondary to MS.  Continue Trazodone and collaboration with neurology and therapy.   

## 2022-06-15 ENCOUNTER — Other Ambulatory Visit: Payer: Self-pay | Admitting: Nurse Practitioner

## 2022-06-17 NOTE — Patient Instructions (Signed)
Below is our plan:  We will continue current treatment plan. We will update labs and MRI. Listen out for a call to schedule MRI.   Please make sure you are staying well hydrated. I recommend 50-60 ounces daily. Well balanced diet and regular exercise encouraged. Consistent sleep schedule with 6-8 hours recommended.   Please continue follow up with care team as directed.   Follow up with Dr Epimenio Foot in 6 months   You may receive a survey regarding today's visit. I encourage you to leave honest feed back as I do use this information to improve patient care. Thank you for seeing me today!

## 2022-06-17 NOTE — Telephone Encounter (Signed)
Requested Prescriptions  Refused Prescriptions Disp Refills   topiramate (TOPAMAX) 100 MG tablet [Pharmacy Med Name: TOPIRAMATE 100 MG TAB] 90 tablet 4    Sig: TAKE 1 TABLET BY MOUTH NIGHTLY     Neurology: Anticonvulsants - topiramate & zonisamide Passed - 06/15/2022 10:05 AM      Passed - Cr in normal range and within 360 days    Creatinine  Date Value Ref Range Status  07/28/2013 0.66 0.60 - 1.30 mg/dL Final   Creatinine, Ser  Date Value Ref Range Status  12/17/2021 0.70 0.57 - 1.00 mg/dL Final         Passed - CO2 in normal range and within 360 days    CO2  Date Value Ref Range Status  12/17/2021 23 20 - 29 mmol/L Final   Co2  Date Value Ref Range Status  07/28/2013 29 21 - 32 mmol/L Final         Passed - ALT in normal range and within 360 days    ALT  Date Value Ref Range Status  12/17/2021 16 0 - 32 IU/L Final   SGPT (ALT)  Date Value Ref Range Status  07/25/2013 18 12 - 78 U/L Final         Passed - AST in normal range and within 360 days    AST  Date Value Ref Range Status  12/17/2021 14 0 - 40 IU/L Final   SGOT(AST)  Date Value Ref Range Status  07/25/2013 20 15 - 37 Unit/L Final         Passed - Completed PHQ-2 or PHQ-9 in the last 360 days      Passed - Valid encounter within last 12 months    Recent Outpatient Visits           3 days ago Multiple sclerosis (HCC)   [place] [facility] [place], Corrie Dandy T, NP   2 months ago Lab test positive for detection of COVID-19 virus   [place] [facility] [place], Corrie Dandy T, NP   6 months ago Multiple sclerosis (HCC)   [place] [facility] [place], Redford T, NP   7 months ago Acute non-recurrent frontal sinusitis   [place] [facility] Larae Grooms, NP   10 months ago Urinary symptom or sign   [place] [facility] [place], [name], NP       Future Appointments             In 6 months [place], [name], NP [place]  [facility]

## 2022-06-17 NOTE — Progress Notes (Unsigned)
No chief complaint on file.   HISTORY OF PRESENT ILLNESS:  06/17/22 ALL:  [name] is a 42 y.o. female here today for follow up for RRMS. She continues Ocrevus infusions every 6 months. Last infusion 04/04/2021, next 10/03/2021. Labs have been normal. MRI stable 09/2020.   She reports that she has done very well until the past month or so. She feels that she is very tired. She feels that she is having more difficulty with concentration. She feels that balance is progressively worsening over the past month. She has had more difficulty with left thigh feeling really hot and having pins and needle sensation when she is walking for longer distances. She has chronic back pain. 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 She is not followed by spine specialist. NO worsening back pain, recently. No falls. She is using her Rolator. She continues baclofen 10mg  in am and 30mg  at bedtime.   Headaches have improved now that she is back on topiramate (PCP). She restarted about a week ago and feels headaches are improving.   Methylphenidate 20mg  BID last filled 04/30/2021 for 30 days. Previously taking Adderall ER 30mg . She switched due to supply issues. She reports that she forgets to take second dose. 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   She feels mood is good. She continues sertraline 200mg  and bupropion 150mg  (PCP). She feels that she sleeps well taking trazodone 100mg  at bedtime. She reports being off bupropion for about 2 months, recently, and felt that she was more emotional. Cried more. All three antidepressants started with psychiatry years ago. She feels that she is tolerating meds well.   She is seeing healthy weight and wellness. She recently started [facility] She took her first injection yesterday.   She continues vitamin D 1000iu daily.    HISTORY (copied from Dr Bonnita Hollow previous note)  [name] is a 42  y.o. woman with relapsing remitting multiple sclerosis.      Update 12/17/2021: She notes more stress trying to finish up grad school and neding to drive more - she has right leg weakness which makes this difficult.      She had her last Ocrevus August 23 and next one scheduled in Feb 24.  She has no definite exacerbations.   MRI of the brain 09/06/2020 showed  a focus within the left middle cerebellar peduncle and a couple small foci in the periventricular white matter.  None of these enhance or appear to be acute.  Compared to the MRI dated 09/24/2017, there were no new lesions.     She is walking about the same with some reduced balance.   She has some some right leg weakness and spasticity.   She stumbles some.  She has a right foot drop. She also takes baclofen (10 mg in am and 30 mg at night)  for spasticity with benefit.       Mood is doing well on Zoloft 200 mg and Wellbutrin 150 mg/d. Marland Kitchen  She also sees [employer] [facility].  She notes mild cognitive issues - especially more difficulty with verbal processing.   She  has some word finding errors and word substitution.     She plays piano and guitar and has some errors when she reads music.  She works as a Production manager and is now in grad school (clinical mental health)     Adderall XR in the morning with additional IR  many but not all afternoons.   Due to a national shortage in Adderall XR, she has been out thos month   She has insomnia helped by trazodone.  PSG showed snoring but not significant OSA.  She had sparse N3 (4.9%)   Migraine headaches have been well on topiramate.   She had Covid last year and again last month. The second time was mild while the first time wiped her out x weeks.  She also has sinus issues and will be seeing ENT.     MS history:   In late June or early July 2011, she had the onset of numbness that went up to her belly button. She presented to the emergency room. An MRI of the brain showed 1 periventricular focus  with maybe a second subtle periventricular focus. An MRI of the cervical spine showed an enhancing lesion adjacent to C3-C4. The diagnosis of possible MS was made but the evidence was not enough to begin a disease modifying therapy. In 2015, she had the onset of similar symptoms and also had decreased ability to use the left hand. An MRI of the brain at that time showed several new lesions not present in 2011, including a focus in the right middle cerebellar peduncle additionally, she had a lumbar puncture consistent with MS. Because of her symptoms, MRI changes and lumbar puncture results, she was diagnosed with clinical definite relapsing remitting MS and was started on Tecfidera. She has continued on Tecfidera. Over the past few months she has noted more difficulty with focus and concentration.     She also has had some change in vision more recently. Because of these newer symptoms, and MRI was repeated 04/27/2015. The MRI of the cervical spine showed no new lesions. though the MRI of the brain showed 2  lesions not present in 2015, one in the left juxtacortical white matter and one in the left deep white matter.  She had a small exacerbation in August 2017 and received IV SOlu-Medrol.   MRI cervical spine 2017 an MRI of the brain show two spinal plaques, a definite rightt middle cerebellar peduncle plaque and a possible right pontine plaque and several foci in the hemispheres  MRI 01/2016 showed no new lesions.  She is JCV Ab positive.  She had an exacerbation in 2017 and early 2018 switch to ocrelizumab with her first doses in May 2018 (was on Gilenya).   She had another small exacerbation in July (right leg stiff/weak) and she received 3 days of IV site.  She went from using a cane to a walker for balance.  She tolerates the ocrelizumab well.      FH:  Her mom has MS and her MS stabilized after a bone marrow transplant for cancer.   MRI images MRI of the brain 09/24/2017 showed T2 hyperintense foci in the  right middle cerebellar peduncle and hemispheres.  There were no changes compared to 09/08/2017.   MRI of the cervical spine 09/26/2017 showed foci within the spinal cord adjacent to C4 and C6-C7.  They were both present on an MRI from 04/04/2016.  There is mild spinal stenosis at C5-C6 due to disc protrusion.   MRI of the brain 09/06/2020 showed  a focus within the left middle cerebellar peduncle and a couple small foci in the periventricular white matter.  None of these enhance or appear to be acute.  Compared to the MRI dated 09/24/2017, there are no new lesions.  These are consistent with chronic demyelinating  plaque associated with multiple sclerosis   REVIEW OF SYSTEMS: Out of a complete 14 system review of symptoms, the patient complains only of the following symptoms, headaches, fatigue, insomnia, and all other reviewed systems are negative.   ALLERGIES: Allergies  Allergen Reactions   Flexeril [Cyclobenzaprine] Other (See Comments)    Drowsy when taking during the day      HOME MEDICATIONS: Outpatient Medications Prior to Visit  Medication Sig Dispense Refill   albuterol (VENTOLIN HFA) 108 (90 Base) MCG/ACT inhaler TAKE 2 PUFFS BY MOUTH EVERY 6 HOURS AS NEEDED FOR WHEEZE OR SHORTNESS OF BREATH 18 each 0   amphetamine-dextroamphetamine (ADDERALL XR) 30 MG 24 hr capsule Take 1 capsule (30 mg total) by mouth daily. 30 capsule 0   baclofen (LIORESAL) 10 MG tablet TAKE 1 TABLET BY MOUTH FOUR TIMES DAILY AS NEEDED FOR MUSCLE SPASMS 360 each 2   buPROPion (WELLBUTRIN XL) 150 MG 24 hr tablet Take 1 tablet (150 mg total) by mouth daily. 30 tablet 12   Cholecalciferol 25 MCG (1000 UT) tablet Take 5,000 Units by mouth daily.      clotrimazole (MYCELEX) 10 MG troche Take 1 tablet (10 mg total) by mouth 5 (five) times daily for 5 days. 25 tablet 0   fluticasone (FLONASE) 50 MCG/ACT nasal spray SPRAY 2 SPRAYS INTO EACH NOSTRIL EVERY DAY 16 mL 2   ocrelizumab 600 mg in sodium chloride 0.9 % 500 mL  Inject 600 mg into the vein every 6 (six) months.     propranolol (INDERAL) 10 MG tablet Take 1 tablet (10 mg total) by mouth daily as needed. For severe anxiety attack 30 tablet 12   sertraline (ZOLOFT) 100 MG tablet Take 2 tablets (200 mg total) by mouth daily. 60 tablet 12   topiramate (TOPAMAX) 100 MG tablet TAKE 1 TABLET BY MOUTH EVERYDAY AT BEDTIME 90 tablet 4   traZODone (DESYREL) 100 MG tablet TAKE 1 TABLET BY MOUTH AT BEDTIME AS NEEDED FOR SLEEP 90 tablet 1   No facility-administered medications prior to visit.     PAST MEDICAL HISTORY: Past Medical History:  Diagnosis Date   Depression    Migraine    MS (multiple sclerosis) (HCC)    Multiple sclerosis (HCC)    Spinal stenosis      PAST SURGICAL HISTORY: Past Surgical History:  Procedure Laterality Date   NO PAST SURGERIES       FAMILY HISTORY: Family History  Problem Relation Age of Onset   Multiple myeloma Mother    Multiple sclerosis Mother    Obesity Sister    Anxiety disorder Daughter    Multiple sclerosis Maternal Aunt      SOCIAL HISTORY: Social History   Socioeconomic History   Marital status: Married    Spouse name: steven ambrose   Number of children: 3   Years of education: Not on file   Highest education level: Bachelor's degree (e.g., BA, AB, BS)  Occupational History   Not on file  Tobacco Use   Smoking status: Never   Smokeless tobacco: Never  Vaping Use   Vaping Use: Never used  Substance and Sexual Activity   Alcohol use: Not Currently    Alcohol/week: 0.0 standard drinks of alcohol    Comment: occasional/fim   Drug use: No   Sexual activity: Yes    Birth control/protection: I.U.D.  Other Topics Concern   Not on file  Social History Narrative   Not on file   Social Determinants of Health  Financial Resource Strain: Low Risk  (05/21/2018)   Overall Financial Resource Strain (CARDIA)    Difficulty of Paying Living Expenses: Not hard at all  Food Insecurity: No Food  Insecurity (05/21/2018)   Hunger Vital Sign    Worried About Running Out of Food in the Last Year: Never true    Ran Out of Food in the Last Year: Never true  Transportation Needs: No Transportation Needs (05/21/2018)   PRAPARE - Administrator, Civil Service (Medical): No    Lack of Transportation (Non-Medical): No  Physical Activity: Inactive (05/21/2018)   Exercise Vital Sign    Days of Exercise per Week: 0 days    Minutes of Exercise per Session: 0 min  Stress: No Stress Concern Present (05/21/2018)   [employer] of Occupational Health - Occupational Stress Questionnaire    Feeling of Stress : Only a little  Social Connections: Unknown (05/21/2018)   Social Connection and Isolation Panel [NHANES]    Frequency of Communication with Friends and Family: Not on file    Frequency of Social Gatherings with Friends and Family: Not on file    Attends Religious Services: More than 4 times per year    Active Member of [employer] or Organizations: No    Attends Banker Meetings: Never    Marital Status: Married  Catering manager Violence: Not At Risk (05/21/2018)   Humiliation, Afraid, Rape, and Kick questionnaire    Fear of Current or Ex-Partner: No    Emotionally Abused: No    Physically Abused: No    Sexually Abused: No     PHYSICAL EXAM  There were no vitals filed for this visit.  There is no height or weight on file to calculate BMI.  Generalized: Well developed, in no acute distress  Cardiology: normal rate and rhythm, no murmur auscultated  Respiratory: clear to auscultation bilaterally    Neurological examination  Mentation: Alert oriented to time, place, history taking. Follows all commands speech and language fluent Cranial nerve II-XII: Pupils were equal round reactive to light. Extraocular movements were full, visual field were full on confrontational test. Facial sensation and strength were normal. Head turning and shoulder shrug  were normal and  symmetric. Motor: The motor testing reveals 5 over 5 strength of all 4 extremities. Good symmetric motor tone is noted throughout.  Sensory: Sensory testing is intact to soft touch on all 4 extremities. No evidence of extinction is noted.  Coordination: Cerebellar testing reveals good finger-nose-finger and heel-to-shin bilaterally.  Gait and station: Gait is normal. Tandem gait is slightly unstable but able to complete without assistive device.   Reflexes: Deep tendon reflexes are symmetric and normal bilaterally.    DIAGNOSTIC DATA (LABS, IMAGING, TESTING) - I reviewed patient records, labs, notes, testing and imaging myself where available.  Lab Results  Component Value Date   WBC 8.3 12/17/2021   HGB 13.5 12/17/2021   HCT 40.6 12/17/2021   MCV 80 12/17/2021   PLT 466 (H) 12/17/2021      Component Value Date/Time   NA 143 12/17/2021 0833   NA 141 07/28/2013 0410   K 4.0 12/17/2021 0833   K 3.7 07/28/2013 0410   CL 107 (H) 12/17/2021 0833   CL 103 07/28/2013 0410   CO2 23 12/17/2021 0833   CO2 29 07/28/2013 0410   GLUCOSE 85 12/17/2021 0833   GLUCOSE 102 (H) 12/21/2020 0940   GLUCOSE 121 (H) 07/28/2013 0410   BUN 15 12/17/2021 1610  BUN 16 07/28/2013 0410   CREATININE 0.70 12/17/2021 0833   CREATININE 0.66 07/28/2013 0410   CALCIUM 9.2 12/17/2021 0833   CALCIUM 8.6 07/28/2013 0410   PROT 6.8 12/17/2021 0833   PROT 6.7 07/25/2013 0410   ALBUMIN 4.4 12/17/2021 0833   ALBUMIN 3.3 (L) 07/25/2013 0410   AST 14 12/17/2021 0833   AST 20 07/25/2013 0410   ALT 16 12/17/2021 0833   ALT 18 07/25/2013 0410   ALKPHOS 145 (H) 12/17/2021 0833   ALKPHOS 49 07/25/2013 0410   BILITOT <0.2 12/17/2021 0833   BILITOT 0.4 07/25/2013 0410   GFRNONAA >60 12/21/2020 0940   GFRNONAA >60 07/28/2013 0410   GFRAA 108 12/06/2019 0955   GFRAA >60 07/28/2013 0410   Lab Results  Component Value Date   CHOL 196 12/17/2021   HDL 53 12/17/2021   LDLCALC 107 (H) 12/17/2021   TRIG 209 (H)  12/17/2021   CHOLHDL 2.6 12/02/2017   Lab Results  Component Value Date   HGBA1C 5.6 06/06/2021   No results found for: "[id number]" Lab Results  Component Value Date   TSH 2.140 12/17/2021        No data to display               No data to display           ASSESSMENT AND PLAN  42 y.o. year old female  has a past medical history of Depression, Migraine, MS (multiple sclerosis) (HCC), Multiple sclerosis (HCC), and Spinal stenosis. here with    No diagnosis found.  [name] returns for follow up. She has noted worsening fatigue, worsening concentration and worsening left dysesthesias over the past month. Review of previous notes show waxing and waning symptoms since at least 2015. Imaging was stable 09/2020. She declines offer to repeat. I will update labs. She will continue Ocrevus infusions. She reports worsening balance. Fortunately no falls. She will continue to use her Rolator. I will send her to PT for evaluation and treatment as indicated. I will have her switch back to Adderall XR 30mg  daily. Discontinue methylphenidate. She was encouraged to monitor mood closely. She is on three antidepressants and feels mood is well managed. She is sleeping well. S/S of serotonin syndrome reviewed and she will monitor closely. Healthy lifestyle habits encouraged. She will follow up with PCP as directed. She will return to see Dr Epimenio Foot in 6 months, sooner if needed. She verbalizes understanding and agreement with this plan.   No orders of the defined types were placed in this encounter.    No orders of the defined types were placed in this encounter.    Shawnie Dapper, MSN, FNP-C 06/17/2022, 8:22 AM  [facility] [address] [place], [place] [address] [phone]

## 2022-06-18 ENCOUNTER — Ambulatory Visit: Payer: BC Managed Care – PPO | Admitting: Family Medicine

## 2022-06-18 ENCOUNTER — Encounter: Payer: Self-pay | Admitting: Family Medicine

## 2022-06-18 VITALS — BP 125/71 | HR 80 | Ht 62.5 in | Wt 209.0 lb

## 2022-06-18 DIAGNOSIS — G43009 Migraine without aura, not intractable, without status migrainosus: Secondary | ICD-10-CM

## 2022-06-18 DIAGNOSIS — R269 Unspecified abnormalities of gait and mobility: Secondary | ICD-10-CM

## 2022-06-18 DIAGNOSIS — G35 Multiple sclerosis: Secondary | ICD-10-CM | POA: Diagnosis not present

## 2022-06-18 DIAGNOSIS — F988 Other specified behavioral and emotional disorders with onset usually occurring in childhood and adolescence: Secondary | ICD-10-CM

## 2022-06-18 DIAGNOSIS — F5105 Insomnia due to other mental disorder: Secondary | ICD-10-CM

## 2022-06-18 DIAGNOSIS — Z79899 Other long term (current) drug therapy: Secondary | ICD-10-CM

## 2022-06-18 DIAGNOSIS — F418 Other specified anxiety disorders: Secondary | ICD-10-CM

## 2022-06-18 DIAGNOSIS — E559 Vitamin D deficiency, unspecified: Secondary | ICD-10-CM

## 2022-06-19 LAB — VITAMIN D 25 HYDROXY (VIT D DEFICIENCY, FRACTURES): Vit D, 25-Hydroxy: 56.3 ng/mL (ref 30.0–100.0)

## 2022-06-19 LAB — CBC WITH DIFFERENTIAL/PLATELET
Basophils Absolute: 0.1 10*3/uL (ref 0.0–0.2)
Basos: 1 %
EOS (ABSOLUTE): 0.2 10*3/uL (ref 0.0–0.4)
Eos: 3 %
Hematocrit: 44.4 % (ref 34.0–46.6)
Hemoglobin: 14.3 g/dL (ref 11.1–15.9)
Immature Grans (Abs): 0 10*3/uL (ref 0.0–0.1)
Immature Granulocytes: 0 %
Lymphocytes Absolute: 1.6 10*3/uL (ref 0.7–3.1)
Lymphs: 24 %
MCH: 26.4 pg — ABNORMAL LOW (ref 26.6–33.0)
MCHC: 32.2 g/dL (ref 31.5–35.7)
MCV: 82 fL (ref 79–97)
Monocytes Absolute: 0.5 10*3/uL (ref 0.1–0.9)
Monocytes: 7 %
Neutrophils Absolute: 4.2 10*3/uL (ref 1.4–7.0)
Neutrophils: 65 %
Platelets: 402 10*3/uL (ref 150–450)
RBC: 5.41 x10E6/uL — ABNORMAL HIGH (ref 3.77–5.28)
RDW: 14.5 % (ref 11.7–15.4)
WBC: 6.5 10*3/uL (ref 3.4–10.8)

## 2022-06-19 LAB — IGG, IGA, IGM
IgA/Immunoglobulin A, Serum: 89 mg/dL (ref 87–352)
IgG (Immunoglobin G), Serum: 817 mg/dL (ref 586–1602)
IgM (Immunoglobulin M), Srm: 79 mg/dL (ref 26–217)

## 2022-06-25 ENCOUNTER — Ambulatory Visit: Payer: BC Managed Care – PPO

## 2022-06-25 DIAGNOSIS — G35 Multiple sclerosis: Secondary | ICD-10-CM

## 2022-06-25 MED ORDER — GADOBENATE DIMEGLUMINE 529 MG/ML IV SOLN
20.0000 mL | Freq: Once | INTRAVENOUS | Status: AC | PRN
Start: 2022-06-25 — End: 2022-06-25
  Administered 2022-06-25: 20 mL via INTRAVENOUS

## 2022-06-27 ENCOUNTER — Other Ambulatory Visit: Payer: Self-pay | Admitting: Neurology

## 2022-07-02 MED ORDER — AMPHETAMINE-DEXTROAMPHET ER 30 MG PO CP24: 30.0000 mg | ORAL_CAPSULE | Freq: Every day | ORAL | 0 refills | Status: DC

## 2022-07-02 NOTE — Telephone Encounter (Signed)
Pt last seen on 06/18/22  Follow up scheduled on 12/23/22 Last filled on 05/22/22 #30 tablets  Rx pending to be signed

## 2022-08-01 ENCOUNTER — Other Ambulatory Visit: Payer: Self-pay | Admitting: Neurology

## 2022-08-01 MED ORDER — AMPHETAMINE-DEXTROAMPHET ER 30 MG PO CP24: 30.0000 mg | ORAL_CAPSULE | Freq: Every day | ORAL | 0 refills | Status: DC

## 2022-08-01 NOTE — Telephone Encounter (Signed)
Last seen on 06/18/22  Follow up scheduled 12/23/22 Last filled on 07/02/22 #30 tablets (30 day supply) Rx pending to be signed

## 2022-08-21 ENCOUNTER — Other Ambulatory Visit: Payer: Self-pay | Admitting: Nurse Practitioner

## 2022-08-21 NOTE — Telephone Encounter (Signed)
Requested Prescriptions  Pending Prescriptions Disp Refills   buPROPion (WELLBUTRIN XL) 150 MG 24 hr tablet [Pharmacy Med Name: BUPROPION HCL ER (XL) 150 MG TAB] 30 tablet 3    Sig: TAKE ONE TABLET BY MOUTH EVERY DAY     Psychiatry: Antidepressants - bupropion Passed - 08/21/2022  8:04 AM      Passed - Cr in normal range and within 360 days    Creatinine  Date Value Ref Range Status  07/28/2013 0.66 0.60 - 1.30 mg/dL Final   Creatinine, Ser  Date Value Ref Range Status  12/17/2021 0.70 0.57 - 1.00 mg/dL Final         Passed - AST in normal range and within 360 days    AST  Date Value Ref Range Status  12/17/2021 14 0 - 40 IU/L Final   SGOT(AST)  Date Value Ref Range Status  07/25/2013 20 15 - 37 Unit/L Final         Passed - ALT in normal range and within 360 days    ALT  Date Value Ref Range Status  12/17/2021 16 0 - 32 IU/L Final   SGPT (ALT)  Date Value Ref Range Status  07/25/2013 18 12 - 78 U/L Final         Passed - Last BP in normal range    BP Readings from Last 1 Encounters:  06/18/22 125/71         Passed - Valid encounter within last 6 months    Recent Outpatient Visits           2 months ago Multiple sclerosis (HCC)   [place] [facility] Steward, [place] T, NP   4 months ago Lab test positive for detection of COVID-19 virus   [place] [facility] King, Corrie Dandy T, NP   8 months ago Multiple sclerosis (HCC)   [place] [facility] [place], [place] T, NP   9 months ago Acute non-recurrent frontal sinusitis   [place] [facility] Larae Grooms, NP   1 year ago Urinary symptom or sign   [place] [facility] [place], [name], NP       Future Appointments             In 3 months Cannady, [name], NP [place] [facility]

## 2022-09-04 ENCOUNTER — Other Ambulatory Visit: Payer: Self-pay | Admitting: Neurology

## 2022-09-05 MED ORDER — AMPHETAMINE-DEXTROAMPHET ER 30 MG PO CP24: 30.0000 mg | ORAL_CAPSULE | Freq: Every day | ORAL | 0 refills | Status: DC

## 2022-09-05 NOTE — Telephone Encounter (Signed)
Last seen on 06/18/22 Follow up scheduled on 12/23/22 Last filled on 07/02/22 #30 tablets (30 day supply) Rx pending to be signed

## 2022-09-05 NOTE — Telephone Encounter (Signed)
I see that transmission failed for the adderall.

## 2022-09-05 NOTE — Addendum Note (Signed)
Addended by: Guy Begin on: 09/05/2022 01:25 PM   Modules accepted: Orders

## 2022-09-07 ENCOUNTER — Other Ambulatory Visit: Payer: Self-pay | Admitting: Neurology

## 2022-09-09 NOTE — Telephone Encounter (Signed)
Last seen on 06/18/22 Follow up scheduled on 12/23/22   Dr.Sater refill request was sent on 09/05/22 , but it appears a message said    E-Prescribing Status: Transmission to pharmacy failed (09/05/2022  4:28 PM EDT)   I have Rx pending to be signed again

## 2022-10-14 ENCOUNTER — Other Ambulatory Visit: Payer: Self-pay | Admitting: Neurology

## 2022-10-15 MED ORDER — AMPHETAMINE-DEXTROAMPHET ER 30 MG PO CP24: 30.0000 mg | ORAL_CAPSULE | Freq: Every day | ORAL | 0 refills | Status: DC

## 2022-10-15 NOTE — Telephone Encounter (Signed)
Last seen on 06/18/22 Follow up scheduled on 12/23/22 Last filled 09/09/22 #30 tablets (30 day supply) Rx pending to be signed

## 2022-11-13 ENCOUNTER — Other Ambulatory Visit: Payer: Self-pay | Admitting: Neurology

## 2022-11-13 MED ORDER — AMPHETAMINE-DEXTROAMPHET ER 30 MG PO CP24: 30.0000 mg | ORAL_CAPSULE | Freq: Every day | ORAL | 0 refills | Status: DC

## 2022-11-13 NOTE — Telephone Encounter (Signed)
Last seen on 06/18/22 Follow up scheduled on 12/23/22 Last filled on 10/18/22 #30 tablets (30 day supply) Rx pending to be signed

## 2022-11-18 ENCOUNTER — Other Ambulatory Visit: Payer: Self-pay | Admitting: Neurology

## 2022-11-19 NOTE — Telephone Encounter (Signed)
Rx just filled on 11/13/22 pt informed via previous request from pt under Rx refills.

## 2022-12-12 ENCOUNTER — Other Ambulatory Visit: Payer: Self-pay | Admitting: Neurology

## 2022-12-12 DIAGNOSIS — F5105 Insomnia due to other mental disorder: Secondary | ICD-10-CM

## 2022-12-12 NOTE — Telephone Encounter (Signed)
Last seen on 06/18/22 Follow up scheduled on 12/20/22

## 2022-12-15 NOTE — Patient Instructions (Signed)
Please call to schedule your mammogram and/or bone density: [facility] at [facility]  Address: [address], [place], [place] [address] Phone: [phone]  [facility] [place] [address] [place],  [place]  [address] Phone: [phone]    Healthy Eating, Adult Healthy eating may help you get and keep a healthy body weight, reduce the risk of chronic disease, and live a long and productive life. It is important to follow a healthy eating pattern. Your nutritional and calorie needs should be met mainly by different nutrient-rich foods. What are tips for following this plan? Reading food labels Read labels and choose the following: Reduced or low sodium products. Juices with 100% fruit juice. Foods with low saturated fats (<3 g per serving) and high polyunsaturated and monounsaturated fats. Foods with whole grains, such as whole wheat, cracked wheat, brown rice, and wild rice. Whole grains that are fortified with folic acid. This is recommended for females who are pregnant or who want to become pregnant. Read labels and do not eat or drink the following: Foods or drinks with added sugars. These include foods that contain brown sugar, corn sweetener, corn syrup, dextrose, fructose, glucose, high-fructose corn syrup, honey, invert sugar, lactose, malt syrup, maltose, molasses, raw sugar, sucrose, trehalose, or turbinado sugar. Limit your intake of added sugars to less than 10% of your total daily calories. Do not eat more than the following amounts of added sugar per day: 6 teaspoons (25 g) for females. 9 teaspoons (38 g) for males. Foods that contain processed or refined starches and grains. Refined grain products, such as white flour, degermed cornmeal, white bread, and white rice. Shopping Choose nutrient-rich snacks, such as vegetables, whole fruits, and nuts. Avoid high-calorie and high-sugar snacks, such as potato chips,  fruit snacks, and candy. Use oil-based dressings and spreads on foods instead of solid fats such as butter, margarine, sour cream, or cream cheese. Limit pre-made sauces, mixes, and "instant" products such as flavored rice, instant noodles, and ready-made pasta. Try more plant-protein sources, such as tofu, tempeh, black beans, edamame, lentils, nuts, and seeds. Explore eating plans such as the Mediterranean diet or vegetarian diet. Try heart-healthy dips made with beans and healthy fats like hummus and guacamole. Vegetables go great with these. Cooking Use oil to saut or stir-fry foods instead of solid fats such as butter, margarine, or lard. Try baking, boiling, grilling, or broiling instead of frying. Remove the fatty part of meats before cooking. Steam vegetables in water or broth. Meal planning  At meals, imagine dividing your plate into fourths: One-half of your plate is fruits and vegetables. One-fourth of your plate is whole grains. One-fourth of your plate is protein, especially lean meats, poultry, eggs, tofu, beans, or nuts. Include low-fat dairy as part of your daily diet. Lifestyle Choose healthy options in all settings, including home, work, school, restaurants, or stores. Prepare your food safely: Wash your hands after handling raw meats. Where you prepare food, keep surfaces clean by regularly washing with hot, soapy water. Keep raw meats separate from ready-to-eat foods, such as fruits and vegetables. Cook seafood, meat, poultry, and eggs to the recommended temperature. Get a food thermometer. Store foods at safe temperatures. In general: Keep cold foods at 40F (4.4C) or below. Keep hot foods at 140F (60C) or above. Keep your freezer at 0F (-17.8C) or below. Foods are not safe to eat if they have been between the temperatures of 40-140F (4.4-60C) for more   than 2 hours. What foods should I eat? Fruits Aim to eat 1-2 cups of fresh, canned (in natural juice),  or frozen fruits each day. One cup of fruit equals 1 small apple, 1 large banana, 8 large strawberries, 1 cup (237 g) canned fruit,  cup (82 g) dried fruit, or 1 cup (240 mL) 100% juice. Vegetables Aim to eat 2-4 cups of fresh and frozen vegetables each day, including different varieties and colors. One cup of vegetables equals 1 cup (91 g) broccoli or cauliflower florets, 2 medium carrots, 2 cups (150 g) raw, leafy greens, 1 large tomato, 1 large bell pepper, 1 large sweet potato, or 1 medium white potato. Grains Aim to eat 5-10 ounce-equivalents of whole grains each day. Examples of 1 ounce-equivalent of grains include 1 slice of bread, 1 cup (40 g) ready-to-eat cereal, 3 cups (24 g) popcorn, or  cup (93 g) cooked rice. Meats and other proteins Try to eat 5-7 ounce-equivalents of protein each day. Examples of 1 ounce-equivalent of protein include 1 egg,  oz nuts (12 almonds, 24 pistachios, or 7 walnut halves), 1/4 cup (90 g) cooked beans, 6 tablespoons (90 g) hummus or 1 tablespoon (16 g) peanut butter. A cut of meat or fish that is the size of a deck of cards is about 3-4 ounce-equivalents (85 g). Of the protein you eat each week, try to have at least 8 sounce (227 g) of seafood. This is about 2 servings per week. This includes salmon, trout, herring, sardines, and anchovies. Dairy Aim to eat 3 cup-equivalents of fat-free or low-fat dairy each day. Examples of 1 cup-equivalent of dairy include 1 cup (240 mL) milk, 8 ounces (250 g) yogurt, 1 ounces (44 g) natural cheese, or 1 cup (240 mL) fortified soy milk. Fats and oils Aim for about 5 teaspoons (21 g) of fats and oils per day. Choose monounsaturated fats, such as canola and olive oils, mayonnaise made with olive oil or avocado oil, avocados, peanut butter, and most nuts, or polyunsaturated fats, such as sunflower, corn, and soybean oils, walnuts, pine nuts, sesame seeds, sunflower seeds, and flaxseed. Beverages Aim for 6 eight-ounce glasses of  water per day. Limit coffee to 3-5 eight-ounce cups per day. Limit caffeinated beverages that have added calories, such as soda and energy drinks. If you drink alcohol: Limit how much you have to: 0-1 drink a day if you are female. 0-2 drinks a day if you are female. Know how much alcohol is in your drink. In the [place] one drink is one 12 oz bottle of beer (355 mL), one 5 oz glass of wine (148 mL), or one 1 oz glass of hard liquor (44 mL). Seasoning and other foods Try not to add too much salt to your food. Try using herbs and spices instead of salt. Try not to add sugar to food. This information is based on [place] nutrition guidelines. To learn more, visit [employer]. Exact amounts may vary. You may need different amounts. This information is not intended to replace advice given to you by your health care provider. Make sure you discuss any questions you have with your health care provider. Document Revised: 10/22/2021 Document Reviewed: 10/22/2021 Elsevier Patient Education  2024 [employer].

## 2022-12-18 ENCOUNTER — Ambulatory Visit (INDEPENDENT_AMBULATORY_CARE_PROVIDER_SITE_OTHER): Payer: BC Managed Care – PPO | Admitting: Nurse Practitioner

## 2022-12-18 ENCOUNTER — Other Ambulatory Visit: Payer: Self-pay | Admitting: Neurology

## 2022-12-18 ENCOUNTER — Encounter: Payer: Self-pay | Admitting: Nurse Practitioner

## 2022-12-18 VITALS — BP 123/80 | HR 85 | Temp 98.5°F | Ht 64.2 in | Wt 226.2 lb

## 2022-12-18 DIAGNOSIS — F5105 Insomnia due to other mental disorder: Secondary | ICD-10-CM

## 2022-12-18 DIAGNOSIS — E7800 Pure hypercholesterolemia, unspecified: Secondary | ICD-10-CM

## 2022-12-18 DIAGNOSIS — Z0000 Encounter for general adult medical examination without abnormal findings: Secondary | ICD-10-CM | POA: Diagnosis not present

## 2022-12-18 DIAGNOSIS — E559 Vitamin D deficiency, unspecified: Secondary | ICD-10-CM

## 2022-12-18 DIAGNOSIS — G43009 Migraine without aura, not intractable, without status migrainosus: Secondary | ICD-10-CM

## 2022-12-18 DIAGNOSIS — Z803 Family history of malignant neoplasm of breast: Secondary | ICD-10-CM

## 2022-12-18 DIAGNOSIS — E66812 Obesity, class 2: Secondary | ICD-10-CM

## 2022-12-18 DIAGNOSIS — F0632 Mood disorder due to known physiological condition with major depressive-like episode: Secondary | ICD-10-CM | POA: Diagnosis not present

## 2022-12-18 DIAGNOSIS — R41840 Attention and concentration deficit: Secondary | ICD-10-CM | POA: Diagnosis not present

## 2022-12-18 DIAGNOSIS — G35 Multiple sclerosis: Secondary | ICD-10-CM | POA: Diagnosis not present

## 2022-12-18 DIAGNOSIS — Z23 Encounter for immunization: Secondary | ICD-10-CM

## 2022-12-18 DIAGNOSIS — E6609 Other obesity due to excess calories: Secondary | ICD-10-CM

## 2022-12-18 DIAGNOSIS — F411 Generalized anxiety disorder: Secondary | ICD-10-CM

## 2022-12-18 MED ORDER — SERTRALINE HCL 100 MG PO TABS: 200.0000 mg | ORAL_TABLET | Freq: Every day | ORAL | 12 refills | Status: DC

## 2022-12-18 MED ORDER — PROPRANOLOL HCL 10 MG PO TABS: 10.0000 mg | ORAL_TABLET | Freq: Every day | ORAL | 12 refills | Status: AC | PRN

## 2022-12-18 MED ORDER — TRAZODONE HCL 100 MG PO TABS: 100.0000 mg | ORAL_TABLET | Freq: Every evening | ORAL | 4 refills | Status: DC | PRN

## 2022-12-18 MED ORDER — AMPHETAMINE-DEXTROAMPHET ER 30 MG PO CP24: 30.0000 mg | ORAL_CAPSULE | Freq: Every day | ORAL | 0 refills | Status: DC

## 2022-12-18 MED ORDER — BUPROPION HCL ER (XL) 300 MG PO TB24: 300.0000 mg | ORAL_TABLET | Freq: Every day | ORAL | 12 refills | Status: DC

## 2022-12-18 NOTE — Assessment & Plan Note (Signed)
BMI 38.59. Recommended eating smaller high protein, low fat meals more frequently and exercising 30 mins a day 5 times a week with a goal of 10-15lb weight loss in the next 3 months. Patient voiced their understanding and motivation to adhere to these recommendations.

## 2022-12-18 NOTE — Assessment & Plan Note (Signed)
Chronic, ongoing, followed by neurology, continue current medication regimen and collaboration with them.

## 2022-12-18 NOTE — Assessment & Plan Note (Signed)
Maternal aunt and great grandmother, will start mammogram screening due to history.  Order placed. 

## 2022-12-18 NOTE — Progress Notes (Signed)
BP 123/80   Pulse 85   Temp 98.5 F (36.9 C) (Oral)   Ht 5' 4.2" (1.631 m)   Wt 226 lb 3.2 oz (102.6 kg)   LMP  (LMP Unknown)   SpO2 98%   BMI 38.59 kg/m    Subjective:    Patient ID: [name], female    DOB: [birth year], 42 y.o.   MRN: [id number]  HPI: [name] is a 42 y.o. female presenting on 12/18/2022 for comprehensive medical examination. Current medical complaints include:none  She currently lives with: husband and children Menopausal Symptoms: no   The 10-year ASCVD risk score (Arnett DK, et al., 2019) is: 0.7%   Values used to calculate the score:     Age: 14 years     Sex: Female     Is Non-Hispanic African American: No     Diabetic: No     Tobacco smoker: No     Systolic Blood Pressure: 123 mmHg     Is BP treated: No     HDL Cholesterol: 53 mg/dL     Total Cholesterol: 196 mg/dL  MIGRAINES & MS Continues on Topamax and is followed by neurology for this and her MS.  Last visit was 06/18/22, sees them every 6 months.  Does infusions for MS, Ocrelizumab, last at the end of August.  No longer taking Topamax, has been off of this or over 6 months.  Her migraines are currently stable. Treatments attempted: none, triptans and topamax   Aura: yes Nausea:  yes Vomiting: no Photophobia:  no Phonophobia:  no Effect on social functioning:  no Numbers of missed days of school/work each month: 1 time Confusion:  no Gait disturbance/ataxia:  no Behavioral changes:  no Fevers:  no    DEPRESSION Followed by psychiatry in past, Dr. Elna Breslow. 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  Continues on Sertraline, Wellbutrin, Adderall, and Trazodone + Propranolol as needed. Adderall is filled by neurology. Mood status: stable Satisfied with current treatment?: yes Symptom severity: mild  Duration of current treatment : chronic Side effects: no Medication compliance: good  compliance Psychotherapy/counseling: yes current Depressed mood: sometimes Anxious mood: sometimes Anhedonia: no Significant weight loss or gain: no Insomnia: yes, hard to stay asleep -- is eating in middle of night as well Fatigue: yes Feelings of worthlessness or guilt: no Impaired concentration/indecisiveness: no Suicidal ideations: no Hopelessness: no Crying spells: no    12/18/2022    9:10 AM 06/14/2022    8:29 AM 03/26/2022    4:20 PM 12/14/2021    9:50 AM 11/13/2021   10:25 AM  Depression screen PHQ 2/9  Decreased Interest 2 1 0 1 1  Down, Depressed, Hopeless 0 1 0 0 0  PHQ - 2 Score 2 2 0 1 1  Altered sleeping 3 3 0 2 1  Tired, decreased energy 3 3 0 3 2  Change in appetite 2 3 0 3 2  Feeling bad or failure about yourself  0 0 0 1 0  Trouble concentrating 2 1 0 1 0  Moving slowly or fidgety/restless 0 0 0 0 0  Suicidal thoughts 0 0 0 0 0  PHQ-9 Score 12 12 0 11 6  Difficult doing work/chores Somewhat difficult Somewhat difficult  Somewhat difficult Somewhat difficult      12/18/2022    9:10 AM 06/14/2022    8:30 AM 03/26/2022    4:21 PM 12/14/2021  9:51 AM  GAD 7 : Generalized Anxiety Score  Nervous, Anxious, on Edge 1 2 0 1  Control/stop worrying 0 1 0 1  Worry too much - different things 1 1 0 1  Trouble relaxing 0 1 0 1  Restless 0 1 0 0  Easily annoyed or irritable 1 1 0 1  Afraid - awful might happen 0 0 0 0  Total GAD 7 Score 3 7 0 5  Anxiety Difficulty Not difficult at all Somewhat difficult  Somewhat difficult      11/13/2021   10:25 AM 12/14/2021    9:50 AM 03/26/2022    4:20 PM 06/14/2022    8:29 AM 12/18/2022    9:10 AM  Fall Risk  Falls in the past year? 1 0 0 0 1  Was there an injury with Fall? 0 0 0 0 0  Fall Risk Category Calculator 2 0 0 0 2  Fall Risk Category (Retired) Moderate Low     (RETIRED) Patient Fall Risk Level Moderate fall risk Low fall risk     Patient at Risk for Falls Due to History of fall(s) No Fall Risks No Fall  Risks No Fall Risks No Fall Risks  Fall risk Follow up Falls evaluation completed Falls evaluation completed Falls evaluation completed Falls evaluation completed Falls evaluation completed    Past Medical History:  Past Medical History:  Diagnosis Date   Depression    Migraine    MS (multiple sclerosis) (HCC)    Multiple sclerosis (HCC)    Spinal stenosis     Surgical History:  Past Surgical History:  Procedure Laterality Date   NO PAST SURGERIES      Medications:  Current Outpatient Medications on File Prior to Visit  Medication Sig   albuterol (VENTOLIN HFA) 108 (90 Base) MCG/ACT inhaler TAKE 2 PUFFS BY MOUTH EVERY 6 HOURS AS NEEDED FOR WHEEZE OR SHORTNESS OF BREATH   amphetamine-dextroamphetamine (ADDERALL XR) 30 MG 24 hr capsule Take 1 capsule (30 mg total) by mouth daily.   baclofen (LIORESAL) 10 MG tablet TAKE 1 TABLET BY MOUTH FOUR TIMES DAILY AS NEEDED FOR MUSCLE SPASMS   Cholecalciferol 25 MCG (1000 UT) tablet Take 5,000 Units by mouth daily.    ocrelizumab 600 mg in sodium chloride 0.9 % 500 mL Inject 600 mg into the vein every 6 (six) months.   No current facility-administered medications on file prior to visit.    Allergies:  Allergies  Allergen Reactions   Flexeril [Cyclobenzaprine] Other (See Comments)    Drowsy when taking during the day     Social History:  Social History   Socioeconomic History   Marital status: Married    Spouse name: steven ambrose   Number of children: 3   Years of education: Not on file   Highest education level: Bachelor's degree (e.g., BA, AB, BS)  Occupational History   Not on file  Tobacco Use   Smoking status: Never   Smokeless tobacco: Never  Vaping Use   Vaping status: Never Used  Substance and Sexual Activity   Alcohol use: Not Currently    Alcohol/week: 0.0 standard drinks of alcohol    Comment: occasional/fim   Drug use: No   Sexual activity: Yes    Birth control/protection: I.U.D.  Other Topics Concern    Not on file  Social History Narrative   Not on file   Social Determinants of Health   Financial Resource Strain: Low Risk  (12/18/2022)   Overall Financial  Resource Strain (CARDIA)    Difficulty of Paying Living Expenses: Not very hard  Food Insecurity: Food Insecurity Present (12/18/2022)   Hunger Vital Sign    Worried About Running Out of Food in the Last Year: Sometimes true    Ran Out of Food in the Last Year: Never true  Transportation Needs: No Transportation Needs (12/18/2022)   PRAPARE - Administrator, Civil Service (Medical): No    Lack of Transportation (Non-Medical): No  Physical Activity: Inactive (12/18/2022)   Exercise Vital Sign    Days of Exercise per Week: 0 days    Minutes of Exercise per Session: 0 min  Stress: No Stress Concern Present (12/18/2022)   [employer] of Occupational Health - Occupational Stress Questionnaire    Feeling of Stress : Only a little  Social Connections: Socially Integrated (12/18/2022)   Social Connection and Isolation Panel [NHANES]    Frequency of Communication with Friends and Family: More than three times a week    Frequency of Social Gatherings with Friends and Family: Once a week    Attends Religious Services: More than 4 times per year    Active Member of [employer] or Organizations: Yes    Attends Banker Meetings: Never    Marital Status: Married  Catering manager Violence: Not At Risk (12/18/2022)   Humiliation, Afraid, Rape, and Kick questionnaire    Fear of Current or Ex-Partner: No    Emotionally Abused: No    Physically Abused: No    Sexually Abused: No   Social History   Tobacco Use  Smoking Status Never  Smokeless Tobacco Never   Social History   Substance and Sexual Activity  Alcohol Use Not Currently   Alcohol/week: 0.0 standard drinks of alcohol   Comment: occasional/fim    Family History:  Family History  Problem Relation Age of Onset   Multiple myeloma Mother    Multiple  sclerosis Mother    Obesity Sister    Anxiety disorder Daughter    Multiple sclerosis Maternal Aunt     Past medical history, surgical history, medications, allergies, family history and social history reviewed with patient today and changes made to appropriate areas of the chart.   Review of Systems - negative All other ROS negative except what is listed above and in the HPI.      Objective:    BP 123/80   Pulse 85   Temp 98.5 F (36.9 C) (Oral)   Ht 5' 4.2" (1.631 m)   Wt 226 lb 3.2 oz (102.6 kg)   LMP  (LMP Unknown)   SpO2 98%   BMI 38.59 kg/m   Wt Readings from Last 3 Encounters:  12/18/22 226 lb 3.2 oz (102.6 kg)  06/18/22 209 lb (94.8 kg)  06/14/22 208 lb 8 oz (94.6 kg)    Physical Exam Vitals and nursing note reviewed. Exam conducted with a chaperone present.  Constitutional:      General: She is awake. She is not in acute distress.    Appearance: She is well-developed and well-groomed. She is obese. She is not ill-appearing or toxic-appearing.  HENT:     Head: Normocephalic and atraumatic.     Right Ear: Hearing, tympanic membrane, ear canal and external ear normal. No drainage.     Left Ear: Hearing, tympanic membrane, ear canal and external ear normal. No drainage.     Nose: Nose normal.     Right Sinus: No maxillary sinus tenderness or frontal sinus tenderness.  Left Sinus: No maxillary sinus tenderness or frontal sinus tenderness.     Mouth/Throat:     Mouth: Mucous membranes are moist.     Pharynx: Oropharynx is clear. Uvula midline. No pharyngeal swelling, oropharyngeal exudate or posterior oropharyngeal erythema.  Eyes:     General: Lids are normal.        Right eye: No discharge.        Left eye: No discharge.     Extraocular Movements: Extraocular movements intact.     Conjunctiva/sclera: Conjunctivae normal.     Pupils: Pupils are equal, round, and reactive to light.     Visual Fields: Right eye visual fields normal and left eye visual fields  normal.  Neck:     Thyroid: No thyromegaly.     Vascular: No carotid bruit.     Trachea: Trachea normal.  Cardiovascular:     Rate and Rhythm: Normal rate and regular rhythm.     Heart sounds: Normal heart sounds. No murmur heard.    No gallop.  Pulmonary:     Effort: Pulmonary effort is normal. No accessory muscle usage or respiratory distress.     Breath sounds: Normal breath sounds.  Chest:  Breasts:    Right: Normal.     Left: Normal.  Abdominal:     General: Bowel sounds are normal.     Palpations: Abdomen is soft. There is no hepatomegaly or splenomegaly.     Tenderness: There is no abdominal tenderness.  Musculoskeletal:        General: Normal range of motion.     Cervical back: Normal range of motion and neck supple.     Right lower leg: No edema.     Left lower leg: No edema.  Lymphadenopathy:     Head:     Right side of head: No submental, submandibular, tonsillar, preauricular or posterior auricular adenopathy.     Left side of head: No submental, submandibular, tonsillar, preauricular or posterior auricular adenopathy.     Cervical: No cervical adenopathy.     Upper Body:     Right upper body: No supraclavicular, axillary or pectoral adenopathy.     Left upper body: No supraclavicular, axillary or pectoral adenopathy.  Skin:    General: Skin is warm and dry.     Capillary Refill: Capillary refill takes less than 2 seconds.     Findings: No rash.  Neurological:     Mental Status: She is alert and oriented to person, place, and time.     Gait: Gait is intact.     Deep Tendon Reflexes: Reflexes are normal and symmetric.     Reflex Scores:      Brachioradialis reflexes are 2+ on the right side and 2+ on the left side.      Patellar reflexes are 2+ on the right side and 2+ on the left side. Psychiatric:        Attention and Perception: Attention normal.        Mood and Affect: Mood normal.        Speech: Speech normal.        Behavior: Behavior normal. Behavior  is cooperative.        Thought Content: Thought content normal.        Judgment: Judgment normal.    Results for orders placed or performed in visit on 06/18/22  IgG, IgA, IgM  Result Value Ref Range   IgG (Immunoglobin G), Serum 817 586 - 1,602 mg/dL   IgA/Immunoglobulin  A, Serum 89 87 - 352 mg/dL   IgM (Immunoglobulin M), Srm 79 26 - 217 mg/dL  CBC with Differential/Platelets  Result Value Ref Range   WBC 6.5 3.4 - 10.8 x10E3/uL   RBC 5.41 (H) 3.77 - 5.28 x10E6/uL   Hemoglobin 14.3 11.1 - 15.9 g/dL   Hematocrit 16.1 09.6 - 46.6 %   MCV 82 79 - 97 fL   MCH 26.4 (L) 26.6 - 33.0 pg   MCHC 32.2 31.5 - 35.7 g/dL   RDW 04.5 40.9 - 81.1 %   Platelets 402 150 - 450 x10E3/uL   Neutrophils 65 Not Estab. %   Lymphs 24 Not Estab. %   Monocytes 7 Not Estab. %   Eos 3 Not Estab. %   Basos 1 Not Estab. %   Neutrophils Absolute 4.2 1.4 - 7.0 x10E3/uL   Lymphocytes Absolute 1.6 0.7 - 3.1 x10E3/uL   Monocytes Absolute 0.5 0.1 - 0.9 x10E3/uL   EOS (ABSOLUTE) 0.2 0.0 - 0.4 x10E3/uL   Basophils Absolute 0.1 0.0 - 0.2 x10E3/uL   Immature Granulocytes 0 Not Estab. %   Immature Grans (Abs) 0.0 0.0 - 0.1 x10E3/uL  Vitamin D, 25-hydroxy  Result Value Ref Range   Vit D, 25-Hydroxy 56.3 30.0 - 100.0 ng/mL      Assessment & Plan:   Problem List Items Addressed This Visit       Cardiovascular and Mediastinum   Migraine without aura and without status migrainosus, not intractable    Chronic, stable.  Currently off Topamax.  Continue collaboration with neurology and recent notes reviewed.      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  Denies SI/HI.  Followed by psychiatry in past,  currently PCP follows. Will increase Wellbutrin XL to 300 MG daily and maintain Zoloft and Trazodone at current doses.  Refills sent in.  Continue with therapist.  Plan for follow-up in 6 weeks.      Multiple sclerosis (HCC) - Primary    Chronic, ongoing -- with infusions.  Continue to collaborate with neurology and current treatment. Recent notes reviewed.      Relevant Orders   CBC with Differential/Platelet   TSH     Other   Attention deficit disorder    Chronic, ongoing, followed by neurology, continue current medication regimen and collaboration with them.      Elevated low density lipoprotein (LDL) cholesterol level    Noted on past labs with ASCVD 0.7%, continue to monitor and diet focus with focus on modest weight loss.  Lipid panel and CMP today.      Relevant Orders   Comprehensive metabolic panel   Lipid Panel w/o Chol/HDL Ratio   Family history of breast cancer    Maternal aunt and great grandmother, will start mammogram screening due to history.  Order placed.      Relevant Orders   MM 3D SCREENING MAMMOGRAM BILATERAL BREAST   GAD (generalized anxiety disorder)    Chronic, exacerbated by multiple things at present.  Denies SI/HI.  Followed by psychiatry in past, currently PCP follows. Will increase Wellbutrin XL to 300 MG daily and maintain Zoloft and Trazodone at current doses.  Refills  sent in.  Continue with therapist.  Plan for follow-up in 6 weeks.      Relevant Medications   traZODone (DESYREL) 100 MG tablet   buPROPion (WELLBUTRIN XL) 300 MG 24 hr tablet   propranolol (INDERAL) 10 MG tablet   sertraline (ZOLOFT) 100 MG tablet   Insomnia due to mental disorder    Chronic, ongoing secondary to MS.  Continue Trazodone and collaboration with neurology and therapy.  Refills sent in as has been out of this.      Relevant Medications   traZODone (DESYREL) 100 MG tablet   Obesity    BMI 38.59. Recommended eating smaller high protein, low fat meals more  frequently and exercising 30 mins a day 5 times a week with a goal of 10-15lb weight loss in the next 3 months. Patient voiced their understanding and motivation to adhere to these recommendations.       Vitamin D deficiency    Chronic, ongoing.  Continue current supplement and check Vit D today.      Relevant Orders   VITAMIN D 25 Hydroxy (Vit-D Deficiency, Fractures)   Other Visit Diagnoses     Encounter for annual physical exam       Annual physical today with labs and health maintenance reviewed, discussed with patient.        Follow up plan: Return in about 6 weeks (around 01/29/2023) for Depression -- increased Wellbutrin XL 300 MG.   LABORATORY TESTING:  - Pap smear: Up To Date  IMMUNIZATIONS:   - Tdap: Tetanus vaccination status reviewed: not to get due to MS. - Influenza: Wishes to think about this - Pneumovax: Not applicable - Prevnar: Not applicable - HPV: Not applicable - Zostavax vaccine: Not applicable  SCREENING: -Mammogram: Ordered today -- family history -- aunt and maternal great grandmother -- she plans on schedule - Colonoscopy: Not applicable  - Bone Density: Not applicable  -Hearing Test: Not applicable  -Spirometry: Not applicable   PATIENT COUNSELING:   Advised to take 1 mg of folate supplement per day if capable of pregnancy.   Sexuality: Discussed sexually transmitted diseases, partner selection, use of condoms, avoidance of unintended pregnancy  and contraceptive alternatives.   Advised to avoid cigarette smoking.  I discussed with the patient that most people either abstain from alcohol or drink within safe limits (<=14/week and <=4 drinks/occasion for males, <=7/weeks and <= 3 drinks/occasion for females) and that the risk for alcohol disorders and other health effects rises proportionally with the number of drinks per week and how often a drinker exceeds daily limits.  Discussed cessation/primary prevention of drug use and availability  of treatment for abuse.   Diet: Encouraged to adjust caloric intake to maintain  or achieve ideal body weight, to reduce intake of dietary saturated fat and total fat, to limit sodium intake by avoiding high sodium foods and not adding table salt, and to maintain adequate dietary potassium and calcium preferably from fresh fruits, vegetables, and low-fat dairy products.    Stressed the importance of regular exercise  Injury prevention: Discussed safety belts, safety helmets, smoke detector, smoking near bedding or upholstery.   Dental health: Discussed importance of regular tooth brushing, flossing, and dental visits.    NEXT PREVENTATIVE PHYSICAL DUE IN 1 YEAR. Return in about 6 weeks (around 01/29/2023) for Depression -- increased Wellbutrin XL 300 MG.

## 2022-12-18 NOTE — Assessment & Plan Note (Signed)
Chronic, ongoing secondary to MS.  Continue Trazodone and collaboration with neurology and therapy.  Refills sent in as has been out of this.

## 2022-12-18 NOTE — Assessment & Plan Note (Addendum)
Chronic, exacerbated by multiple things at present.  Denies SI/HI.  Followed by psychiatry in past, currently PCP follows. Will increase Wellbutrin XL to 300 MG daily and maintain Zoloft and Trazodone at current doses.  Refills sent in.  Continue with therapist.  Plan for follow-up in 6 weeks.

## 2022-12-18 NOTE — Telephone Encounter (Signed)
Last seen on 06/18/22 Follow up scheduled on 12/20/22 Last filled on 11/18/22 #30 tablets  Rx pending to be signed

## 2022-12-18 NOTE — Assessment & Plan Note (Signed)
Chronic, ongoing.  Continue current supplement and check Vit D today. 

## 2022-12-18 NOTE — Assessment & Plan Note (Signed)
Chronic, stable.  Currently off Topamax.  Continue collaboration with neurology and recent notes reviewed.

## 2022-12-18 NOTE — Addendum Note (Signed)
Addended by: Aura Dials T on: 12/18/2022 10:12 AM   Modules accepted: Orders

## 2022-12-18 NOTE — Assessment & Plan Note (Addendum)
Chronic, ongoing -- with infusions.  Continue to collaborate with neurology and current treatment. Recent notes reviewed.

## 2022-12-18 NOTE — Assessment & Plan Note (Signed)
Noted on past labs with ASCVD 0.7%, continue to monitor and diet focus with focus on modest weight loss.  Lipid panel and CMP today.

## 2022-12-19 LAB — CBC WITH DIFFERENTIAL/PLATELET
Basophils Absolute: 0 10*3/uL (ref 0.0–0.2)
Basos: 1 %
EOS (ABSOLUTE): 0.2 10*3/uL (ref 0.0–0.4)
Eos: 3 %
Hematocrit: 43.3 % (ref 34.0–46.6)
Hemoglobin: 13.8 g/dL (ref 11.1–15.9)
Immature Grans (Abs): 0 10*3/uL (ref 0.0–0.1)
Immature Granulocytes: 0 %
Lymphocytes Absolute: 1.7 10*3/uL (ref 0.7–3.1)
Lymphs: 20 %
MCH: 26.2 pg — ABNORMAL LOW (ref 26.6–33.0)
MCHC: 31.9 g/dL (ref 31.5–35.7)
MCV: 82 fL (ref 79–97)
Monocytes Absolute: 0.6 10*3/uL (ref 0.1–0.9)
Monocytes: 7 %
Neutrophils Absolute: 6 10*3/uL (ref 1.4–7.0)
Neutrophils: 69 %
Platelets: 459 10*3/uL — ABNORMAL HIGH (ref 150–450)
RBC: 5.26 x10E6/uL (ref 3.77–5.28)
RDW: 13.2 % (ref 11.7–15.4)
WBC: 8.6 10*3/uL (ref 3.4–10.8)

## 2022-12-19 LAB — COMPREHENSIVE METABOLIC PANEL
ALT: 15 [IU]/L (ref 0–32)
AST: 15 [IU]/L (ref 0–40)
Albumin: 4.6 g/dL (ref 3.9–4.9)
Alkaline Phosphatase: 129 [IU]/L — ABNORMAL HIGH (ref 44–121)
BUN/Creatinine Ratio: 13 (ref 9–23)
BUN: 9 mg/dL (ref 6–24)
Bilirubin Total: 0.3 mg/dL (ref 0.0–1.2)
CO2: 24 mmol/L (ref 20–29)
Calcium: 9.4 mg/dL (ref 8.7–10.2)
Chloride: 101 mmol/L (ref 96–106)
Creatinine, Ser: 0.69 mg/dL (ref 0.57–1.00)
Globulin, Total: 2.3 g/dL (ref 1.5–4.5)
Glucose: 91 mg/dL (ref 70–99)
Potassium: 3.9 mmol/L (ref 3.5–5.2)
Sodium: 142 mmol/L (ref 134–144)
Total Protein: 6.9 g/dL (ref 6.0–8.5)
eGFR: 110 mL/min/{1.73_m2} (ref >59)

## 2022-12-19 LAB — LIPID PANEL W/O CHOL/HDL RATIO
Cholesterol, Total: 180 mg/dL (ref 100–199)
HDL: 58 mg/dL (ref >39)
LDL Chol Calc (NIH): 102 mg/dL — ABNORMAL HIGH (ref 0–99)
Triglycerides: 111 mg/dL (ref 0–149)
VLDL Cholesterol Cal: 20 mg/dL (ref 5–40)

## 2022-12-19 LAB — VITAMIN D 25 HYDROXY (VIT D DEFICIENCY, FRACTURES): Vit D, 25-Hydroxy: 59.2 ng/mL (ref 30.0–100.0)

## 2022-12-19 LAB — TSH: TSH: 1.08 u[IU]/mL (ref 0.450–4.500)

## 2022-12-19 NOTE — Progress Notes (Signed)
Contacted via MyChart The 10-year ASCVD risk score (Arnett DK, et al., 2019) is: 0.5%   Values used to calculate the score:     Age: 42 years     Sex: Female     Is Non-Hispanic African American: No     Diabetic: No     Tobacco smoker: No     Systolic Blood Pressure: 123 mmHg     Is BP treated: No     HDL Cholesterol: 58 mg/dL     Total Cholesterol: 180 mg/dL   Good afternoon Marinell, your labs have returned and overall remain stable. Platelets are a little elevated, but stable.  LDL remains elevated, but has come down from previous check.  Continue focus on diet and exercise.  Any questions? Keep being stellar!!  Thank you for allowing me to participate in your care.  I appreciate you. Kindest regards, Samiel Peel

## 2022-12-20 ENCOUNTER — Ambulatory Visit: Payer: BC Managed Care – PPO | Admitting: Neurology

## 2022-12-20 ENCOUNTER — Encounter: Payer: Self-pay | Admitting: Neurology

## 2022-12-20 VITALS — BP 139/78 | HR 100 | Ht 62.0 in | Wt 226.0 lb

## 2022-12-20 DIAGNOSIS — Z79899 Other long term (current) drug therapy: Secondary | ICD-10-CM

## 2022-12-20 DIAGNOSIS — G43009 Migraine without aura, not intractable, without status migrainosus: Secondary | ICD-10-CM | POA: Diagnosis not present

## 2022-12-20 DIAGNOSIS — F418 Other specified anxiety disorders: Secondary | ICD-10-CM

## 2022-12-20 DIAGNOSIS — R269 Unspecified abnormalities of gait and mobility: Secondary | ICD-10-CM

## 2022-12-20 DIAGNOSIS — F5105 Insomnia due to other mental disorder: Secondary | ICD-10-CM

## 2022-12-20 DIAGNOSIS — G35 Multiple sclerosis: Secondary | ICD-10-CM | POA: Diagnosis not present

## 2022-12-20 DIAGNOSIS — M21371 Foot drop, right foot: Secondary | ICD-10-CM

## 2022-12-20 MED ORDER — IMIPRAMINE HCL 25 MG PO TABS: ORAL_TABLET | ORAL | 11 refills | Status: DC

## 2022-12-20 NOTE — Progress Notes (Signed)
[facility]  PATIENT: [name] DOB: 03-Nov-1980  REFERRING DOCTOR OR PCP:  Dr. Cristopher Peru  [facility]) SOURCE: Patient, a couple MRI reports and images on PACS. Lab reports, notes from Dr. Clelia Croft  _________________________________   HISTORICAL  CHIEF COMPLAINT:  Chief Complaint  Patient presents with   Follow-up    Rm 10,  Pt states she is feeling fatigued everyday and has pain on her legs as well.     HISTORY OF PRESENT ILLNESS:  [name] is a 42 y.o. woman with relapsing remitting multiple sclerosis.     Update 12/20/2022: She had her last Ocrevus end of August 24 and next one scheduled in Feb 25.  She needs extra Benadryl with most infusions but is fine the next day.    She has no definite exacerbations.   MRI of the brain 52024showed  a focus within the left middle cerebellar peduncle and a couple small foci in the periventricular white matter.  None of these enhance or appear to be acute.  Compared to the MRI from 2022, there were no new lesions.    She has reduced balance.   She has some some right leg weakness and spasticity.   She stumbles some and had one fall trippig on her dragging .  She has a right foot drop. She has never tried an AFO.  She also takes baclofen (10 mg in am and 30 mg at night)  for spasticity with benefit.       Mood is doing well on Zoloft 200 mg and Wellbutrin 300 mg/d (just increased). .  She also sees [employer].  She notes mild cognitive issues - especially more difficulty with verbal processing.   She  has some word finding errors and word substitution.     She plays piano and guitar and has some errors when she reads music.  She changed job to a Theatre manager.     Adderall XR in the morning with additional IR many but not all afternoons.    She has insomnia helped by trazodone but she has not taken recently --- she notes insomnia is better if she takes trazodone.  PSG showed  snoring but not significant OSA.  She had sparse N3 (4.9%)  Migraine headaches have been well on topiramate.   She had Covid last year and again last month. The second time was mild while the first time wiped her out x weeks.  She also has sinus issues and will be seeing ENT.    MS history:   In late June or early July 2011, she had the onset of numbness that went up to her belly button. She presented to the emergency room. An MRI of the brain showed 1 periventricular focus with maybe a second subtle periventricular focus. An MRI of the cervical spine showed an enhancing lesion adjacent to C3-C4. The diagnosis of possible MS was made but the evidence was not enough to begin a disease modifying therapy. In 2015, she had the onset of similar symptoms and also had decreased ability to use the left hand. An MRI of the brain at that time showed several new lesions not present in 2011, including a focus in the right middle cerebellar peduncle additionally, she had a lumbar puncture consistent with MS. Because of her symptoms, MRI changes and lumbar puncture results, she was diagnosed with clinical definite relapsing remitting MS and was started on Tecfidera. She has continued on Tecfidera. Over the past  few months she has noted more difficulty with focus and concentration.     She also has had some change in vision more recently. Because of these newer symptoms, and MRI was repeated 04/27/2015. The MRI of the cervical spine showed no new lesions. though the MRI of the brain showed 2  lesions not present in 2015, one in the left juxtacortical white matter and one in the left deep white matter.  She had a small exacerbation in August 2017 and received IV SOlu-Medrol.   MRI cervical spine 2017 an MRI of the brain show two spinal plaques, a definite rightt middle cerebellar peduncle plaque and a possible right pontine plaque and several foci in the hemispheres  MRI 01/2016 showed no new lesions.  She is JCV Ab positive.   She had an exacerbation in 2017 and early 2018 switch to ocrelizumab with her first doses in May 2018 (was on Gilenya).   She had another small exacerbation in July (right leg stiff/weak) and she received 3 days of IV site.  She went from using a cane to a walker for balance.  She tolerates the ocrelizumab well.     FH:  Her mom has MS and her MS stabilized after a bone marrow transplant for cancer.  MRI images MRI of the brain 09/24/2017 showed T2 hyperintense foci in the right middle cerebellar peduncle and hemispheres.  There were no changes compared to 09/08/2017.  MRI of the cervical spine 09/26/2017 showed foci within the spinal cord adjacent to C4 and C6-C7.  They were both present on an MRI from 04/04/2016.  There is mild spinal stenosis at C5-C6 due to disc protrusion.  MRI of the brain 09/06/2020 showed  a focus within the left middle cerebellar peduncle and a couple small foci in the periventricular white matter.  None of these enhance or appear to be acute.  Compared to the MRI dated 09/24/2017, there are no new lesions.  These are consistent with chronic demyelinating plaque associated with multiple sclerosis   REVIEW OF SYSTEMS: Constitutional: No fevers, chills, sweats, or change in appetite.  She has fatigue and insomnia. Eyes: No visual changes, double vision, eye pain Ear, nose and throat: No hearing loss, ear pain, nasal congestion, sore throat Cardiovascular: No chest pain, palpitations Respiratory:  No shortness of breath at rest or with exertion.   No wheezes GastrointestinaI: No nausea, vomiting, diarrhea, abdominal pain, fecal incontinence Genitourinary:    She notes frequency, some hesitancy at times and nocturia. Musculoskeletal:  No neck pain, back pain Integumentary: No rash, pruritus, skin lesions Neurological: as above Psychiatric: as above Endocrine: No palpitations, diaphoresis, change in appetite, change in weigh or increased thirst Hematologic/Lymphatic:  No anemia,  purpura, petechiae. Allergic/Immunologic: No itchy/runny eyes, nasal congestion, recent allergic reactions, rashes  ALLERGIES: Allergies  Allergen Reactions   Flexeril [Cyclobenzaprine] Other (See Comments)    Drowsy when taking during the day     HOME MEDICATIONS:  Current Outpatient Medications:    albuterol (VENTOLIN HFA) 108 (90 Base) MCG/ACT inhaler, TAKE 2 PUFFS BY MOUTH EVERY 6 HOURS AS NEEDED FOR WHEEZE OR SHORTNESS OF BREATH, Disp: 18 each, Rfl: 0   amphetamine-dextroamphetamine (ADDERALL XR) 30 MG 24 hr capsule, Take 1 capsule (30 mg total) by mouth daily., Disp: 30 capsule, Rfl: 0   baclofen (LIORESAL) 10 MG tablet, TAKE 1 TABLET BY MOUTH FOUR TIMES DAILY AS NEEDED FOR MUSCLE SPASMS, Disp: 360 each, Rfl: 2   buPROPion (WELLBUTRIN XL) 300 MG 24 hr tablet,  Take 1 tablet (300 mg total) by mouth daily., Disp: 30 tablet, Rfl: 12   Cholecalciferol 25 MCG (1000 UT) tablet, Take 5,000 Units by mouth daily. , Disp: , Rfl:    imipramine (TOFRANIL) 25 MG tablet, One po qHS, Disp: 30 tablet, Rfl: 11   ocrelizumab 600 mg in sodium chloride 0.9 % 500 mL, Inject 600 mg into the vein every 6 (six) months., Disp: , Rfl:    propranolol (INDERAL) 10 MG tablet, Take 1 tablet (10 mg total) by mouth daily as needed. For severe anxiety attack, Disp: 30 tablet, Rfl: 12   sertraline (ZOLOFT) 100 MG tablet, Take 2 tablets (200 mg total) by mouth daily., Disp: 60 tablet, Rfl: 12  PAST MEDICAL HISTORY: Past Medical History:  Diagnosis Date   Depression    Migraine    MS (multiple sclerosis) (HCC)    Multiple sclerosis (HCC)    Spinal stenosis     PAST SURGICAL HISTORY: Past Surgical History:  Procedure Laterality Date   NO PAST SURGERIES      FAMILY HISTORY: Family History  Problem Relation Age of Onset   Multiple myeloma Mother    Multiple sclerosis Mother    Obesity Sister    Anxiety disorder Daughter    Multiple sclerosis Maternal Aunt     SOCIAL HISTORY:  Social History    Socioeconomic History   Marital status: Married    Spouse name: steven ambrose   Number of children: 3   Years of education: Not on file   Highest education level: Bachelor's degree (e.g., BA, AB, BS)  Occupational History   Not on file  Tobacco Use   Smoking status: Never   Smokeless tobacco: Never  Vaping Use   Vaping status: Never Used  Substance and Sexual Activity   Alcohol use: Not Currently    Alcohol/week: 0.0 standard drinks of alcohol    Comment: occasional/fim   Drug use: No   Sexual activity: Yes    Birth control/protection: I.U.D.  Other Topics Concern   Not on file  Social History Narrative   Not on file   Social Determinants of Health   Financial Resource Strain: Low Risk  (12/18/2022)   Overall Financial Resource Strain (CARDIA)    Difficulty of Paying Living Expenses: Not very hard  Food Insecurity: Food Insecurity Present (12/18/2022)   Hunger Vital Sign    Worried About Running Out of Food in the Last Year: Sometimes true    Ran Out of Food in the Last Year: Never true  Transportation Needs: No Transportation Needs (12/18/2022)   PRAPARE - Administrator, Civil Service (Medical): No    Lack of Transportation (Non-Medical): No  Physical Activity: Inactive (12/18/2022)   Exercise Vital Sign    Days of Exercise per Week: 0 days    Minutes of Exercise per Session: 0 min  Stress: No Stress Concern Present (12/18/2022)   [employer] of Occupational Health - Occupational Stress Questionnaire    Feeling of Stress : Only a little  Social Connections: Socially Integrated (12/18/2022)   Social Connection and Isolation Panel [NHANES]    Frequency of Communication with Friends and Family: More than three times a week    Frequency of Social Gatherings with Friends and Family: Once a week    Attends Religious Services: More than 4 times per year    Active Member of [employer] or Organizations: Yes    Attends Banker Meetings: Never     Marital Status:  Married  Intimate Partner Violence: Not At Risk (12/18/2022)   Humiliation, Afraid, Rape, and Kick questionnaire    Fear of Current or Ex-Partner: No    Emotionally Abused: No    Physically Abused: No    Sexually Abused: No     PHYSICAL EXAM  Vitals:   12/20/22 0826  BP: 139/78  Pulse: 100  Weight: 226 lb (102.5 kg)  Height: 5\' 2"  (1.575 m)    Body mass index is 41.34 kg/m.   General: The patient is well-developed and well-nourished and in no acute distress    Musculoskeletal:    The neck is nontender with a good range of motion.  Neurologic Exam  Mental status: The patient is alert and oriented x 3 at the time of the examination. The patient has apparent normal recent and remote memory, with an apparently normal attention span and concentration ability.   Speech is normal.  Cranial nerves: Extraocular movements are full.  Facial strength and sensation is normal.  Trapezius strength is normal.  The tongue is midline, and the patient has symmetric elevation of the soft palate. No obvious hearing deficits are noted.  Motor:  Muscle bulk is normal.   Tone is mildly increased in legs, fairly equally.  Strength is  5 / 5 in all 4 extremities except 4+/5 in the feet.   Sensory: Sensory testing shows reduced vibration in the right leg and allodynia to touch in right leg.    She has normal sensation in the arms.  Coordination: Cerebellar testing shows good finger-nose-finger but reduced heel-to-shin slightly worse on the right than left.     Gait and station: Station is normal.  She has a mild right foot drop and poor tandem gait.  Romberg is negative.  Reflexes: Deep tendon reflexes are symmetric and increased in her legs. She has crossed adductor responses at the knees.       DIAGNOSTIC DATA (LABS, IMAGING, TESTING) - I reviewed patient records, labs, notes, testing and imaging myself where available.  Lab Results  Component Value Date   WBC 8.6  12/18/2022   HGB 13.8 12/18/2022   HCT 43.3 12/18/2022   MCV 82 12/18/2022   PLT 459 (H) 12/18/2022      Component Value Date/Time   NA 142 12/18/2022 1028   NA 141 07/28/2013 0410   K 3.9 12/18/2022 1028   K 3.7 07/28/2013 0410   CL 101 12/18/2022 1028   CL 103 07/28/2013 0410   CO2 24 12/18/2022 1028   CO2 29 07/28/2013 0410   GLUCOSE 91 12/18/2022 1028   GLUCOSE 102 (H) 12/21/2020 0940   GLUCOSE 121 (H) 07/28/2013 0410   BUN 9 12/18/2022 1028   BUN 16 07/28/2013 0410   CREATININE 0.69 12/18/2022 1028   CREATININE 0.66 07/28/2013 0410   CALCIUM 9.4 12/18/2022 1028   CALCIUM 8.6 07/28/2013 0410   PROT 6.9 12/18/2022 1028   PROT 6.7 07/25/2013 0410   ALBUMIN 4.6 12/18/2022 1028   ALBUMIN 3.3 (L) 07/25/2013 0410   AST 15 12/18/2022 1028   AST 20 07/25/2013 0410   ALT 15 12/18/2022 1028   ALT 18 07/25/2013 0410   ALKPHOS 129 (H) 12/18/2022 1028   ALKPHOS 49 07/25/2013 0410   BILITOT 0.3 12/18/2022 1028   BILITOT 0.4 07/25/2013 0410   GFRNONAA >60 12/21/2020 0940   GFRNONAA >60 07/28/2013 0410   GFRAA 108 12/06/2019 0955   GFRAA >60 07/28/2013 0410        ASSESSMENT AND PLAN  Relapsing remitting multiple sclerosis (HCC)  High risk medication use  Gait disturbance  Migraine without aura and without status migrainosus, not intractable  Depression with anxiety  Insomnia due to mental disorder  Foot drop, right   1.   Continue Ocrevus.     IgG/IgM and CBC with differential were fine with the last visit.  This will need to be rechecked again at the next visit.   2.   Change trazodone to imipramine for insomnia and nocturia.   3.   Stay active and exercise as tolerated. 4.   Continue Adderall 30 mg every morning. 5.   Return in 6 months or sooner if there are new or worsening neurologic symptoms.   Winferd Wease A. Epimenio Foot, MD, [facility] 12/20/2022, 8:48 AM Certified in Neurology, Clinical Neurophysiology, Sleep Medicine and Neuroimaging  [facility] [address], [address] [place], [place] [address] [phone] olll

## 2022-12-23 ENCOUNTER — Ambulatory Visit: Payer: BC Managed Care – PPO | Admitting: Neurology

## 2023-01-07 ENCOUNTER — Other Ambulatory Visit: Payer: Self-pay | Admitting: Nurse Practitioner

## 2023-01-09 NOTE — Telephone Encounter (Signed)
Requested Prescriptions  Pending Prescriptions Disp Refills   buPROPion (WELLBUTRIN XL) 150 MG 24 hr tablet [Pharmacy Med Name: BUPROPION HCL ER (XL) 150 MG TAB] 30 tablet 0    Sig: TAKE ONE TABLET BY MOUTH EVERY DAY     Psychiatry: Antidepressants - bupropion Passed - 01/07/2023  2:59 PM      Passed - Cr in normal range and within 360 days    Creatinine  Date Value Ref Range Status  07/28/2013 0.66 0.60 - 1.30 mg/dL Final   Creatinine, Ser  Date Value Ref Range Status  12/18/2022 0.69 0.57 - 1.00 mg/dL Final         Passed - AST in normal range and within 360 days    AST  Date Value Ref Range Status  12/18/2022 15 0 - 40 IU/L Final   SGOT(AST)  Date Value Ref Range Status  07/25/2013 20 15 - 37 Unit/L Final         Passed - ALT in normal range and within 360 days    ALT  Date Value Ref Range Status  12/18/2022 15 0 - 32 IU/L Final   SGPT (ALT)  Date Value Ref Range Status  07/25/2013 18 12 - 78 U/L Final         Passed - Completed PHQ-2 or PHQ-9 in the last 360 days      Passed - Last BP in normal range    BP Readings from Last 1 Encounters:  12/20/22 139/78         Passed - Valid encounter within last 6 months    Recent Outpatient Visits           3 weeks ago Multiple sclerosis (HCC)   [name], Corrie Dandy T, NP   6 months ago Multiple sclerosis (HCC)   [name], [name], NP   9 months ago Lab test positive for detection of COVID-19 virus   [name], Corrie Dandy T, NP   1 year ago Multiple sclerosis [facility])   [place] [facility] [place], Corrie Dandy T, NP   1 year ago Acute non-recurrent frontal sinusitis   [name], NP       Future Appointments             In 3 weeks Cann[place], [name], NP  [facility]

## 2023-01-17 ENCOUNTER — Other Ambulatory Visit: Payer: Self-pay | Admitting: Neurology

## 2023-01-21 ENCOUNTER — Other Ambulatory Visit: Payer: Self-pay | Admitting: Neurology

## 2023-01-21 ENCOUNTER — Other Ambulatory Visit: Payer: Self-pay

## 2023-01-21 MED ORDER — AMPHETAMINE-DEXTROAMPHET ER 30 MG PO CP24: 30.0000 mg | ORAL_CAPSULE | Freq: Every day | ORAL | 0 refills | Status: DC

## 2023-01-21 NOTE — Telephone Encounter (Signed)
Last seen on 12/20/22 Follow up scheduled on 07/22/23 Last filled on 12/18/22 #30 tablets (30 day supply) Rx pending to signed

## 2023-01-21 NOTE — Telephone Encounter (Signed)
Pt Last Seen 12/20/2023 Upcoming Appointment 07/22/2023 (Amy Lomax)  Adderall last filled 12/18/2022 Escript 01/21/2023

## 2023-02-01 NOTE — Patient Instructions (Signed)
 Be Involved in Caring For Your Health:  Taking Medications When medications are taken as directed, they can greatly improve your health. But if they are not taken as prescribed, they may not work. In some cases, not taking them correctly can be harmful. To help ensure your treatment remains effective and safe, understand your medications and how to take them. Bring your medications to each visit for review by your provider.  Your lab results, notes, and after visit summary will be available on My Chart. We strongly encourage you to use this feature. If lab results are abnormal the clinic will contact you with the appropriate steps. If the clinic does not contact you assume the results are satisfactory. You can always view your results on My Chart. If you have questions regarding your health or results, please contact the clinic during office hours. You can also ask questions on My Chart.  We at [facility] are grateful that you chose us  to provide your care. We strive to provide evidence-based and compassionate care and are always looking for feedback. If you get a survey from the clinic please complete this so we can hear your opinions.  Managing Depression, Adult Depression is a mental health condition that affects your thoughts, feelings, and actions. Being diagnosed with depression can bring you relief if you did not know why you have felt or behaved a certain way. It could also leave you feeling overwhelmed. Finding ways to manage your symptoms can help you feel more positive about your future. How to manage lifestyle changes Being depressed is difficult. Depression can increase the level of everyday stress. Stress can make depression symptoms worse. You may believe your symptoms cannot be managed or will never improve. However, there are many things you can try to help manage your symptoms. There is hope. Managing stress  Stress is your body's reaction to life changes and events,  both good and bad. Stress can add to your feelings of depression. Learning to manage your stress can help lessen your feelings of depression. Try some of the following approaches to reducing your stress (stress reduction techniques): Listen to music that you enjoy and that inspires you. Try using a meditation app or take a meditation class. Develop a practice that helps you connect with your spiritual self. Walk in nature, pray, or go to a place of worship. Practice deep breathing. To do this, inhale slowly through your nose. Pause at the top of your inhale for a few seconds and then exhale slowly, letting yourself relax. Repeat this three or four times. Practice yoga to help relax and work your muscles. Choose a stress reduction technique that works for you. These techniques take time and practice to develop. Set aside 5-15 minutes a day to do them. Therapists can offer training in these techniques. Do these things to help manage stress: Keep a journal. Know your limits. Set healthy boundaries for yourself and others, such as saying no when you think something is too much. Pay attention to how you react to certain situations. You may not be able to control everything, but you can change your reaction. Add humor to your life by watching funny movies or shows. Make time for activities that you enjoy and that relax you. Spend less time using electronics, especially at night before bed. The light from screens can make your brain think it is time to get up rather than go to bed.  Medicines Medicines, such as antidepressants, are often a part of  treatment for depression. Talk with your pharmacist or health care provider about all the medicines, supplements, and herbal products that you take, their possible side effects, and what medicines and other products are safe to take together. Make sure to report any side effects you may have to your health care provider. Relationships Your health care  provider may suggest family therapy, couples therapy, or individual therapy as part of your treatment. How to recognize changes Everyone responds differently to treatment for depression. As you recover from depression, you may start to: Have more interest in doing activities. Feel more hopeful. Have more energy. Eat a more regular amount of food. Have better mental focus. It is important to recognize if your depression is not getting better or is getting worse. The symptoms you had in the beginning may return, such as: Feeling tired. Eating too much or too little. Sleeping too much or too little. Feeling restless, agitated, or hopeless. Trouble focusing or making decisions. Having unexplained aches and pains. Feeling irritable, angry, or aggressive. If you or your family members notice these symptoms coming back, let your health care provider know right away. Follow these instructions at home: Activity Try to get some form of exercise each day, such as walking. Try yoga, mindfulness, or other stress reduction techniques. Participate in group activities if you are able. Lifestyle Get enough sleep. Cut down on or stop using caffeine, tobacco, alcohol, and any other harmful substances. Eat a healthy diet that includes plenty of vegetables, fruits, whole grains, low-fat dairy products, and lean protein. Limit foods that are high in solid fats, added sugar, or salt (sodium). General instructions Take over-the-counter and prescription medicines only as told by your health care provider. Keep all follow-up visits. It is important for your health care provider to check on your mood, behavior, and medicines. Your health care provider may need to make changes to your treatment. Where to find support Talking to others  Friends and family members can be sources of support and guidance. Talk to trusted friends or family members about your condition. Explain your symptoms and let them know that you  are working with a health care provider to treat your depression. Tell friends and family how they can help. Finances Find mental health providers that fit with your financial situation. Talk with your health care provider if you are worried about access to food, housing, or medicine. Call your insurance company to learn about your co-pays and prescription plan. Where to find more information You can find support in your area from: Anxiety and Depression Association of America (ADAA): adaa.org Mental Health America: mentalhealthamerica.net The [employer] on Mental Illness: nami.org Contact a health care provider if: You stop taking your antidepressant medicines, and you have any of these symptoms: Nausea. Headache. Light-headedness. Chills and body aches. Not being able to sleep (insomnia). You or your friends and family think your depression is getting worse. Get help right away if: You have thoughts of hurting yourself or others. Get help right away if you feel like you may hurt yourself or others, or have thoughts about taking your own life. Go to your nearest emergency room or: Call 911. Call the National Suicide Prevention Lifeline at (743) 630-7432 or 988. This is open 24 hours a day. Text the Crisis Text Line at 854-590-3828. This information is not intended to replace advice given to you by your health care provider. Make sure you discuss any questions you have with your health care provider. Document Revised: 05/29/2021 Document Reviewed:  05/29/2021 Elsevier Patient Education  2024 [employer].

## 2023-02-03 ENCOUNTER — Encounter: Payer: Self-pay | Admitting: Nurse Practitioner

## 2023-02-04 ENCOUNTER — Other Ambulatory Visit: Payer: Self-pay | Admitting: Nurse Practitioner

## 2023-02-04 ENCOUNTER — Ambulatory Visit: Payer: BC Managed Care – PPO | Admitting: Nurse Practitioner

## 2023-02-04 VITALS — BP 120/83 | HR 96 | Temp 98.0°F | Wt 228.4 lb

## 2023-02-04 DIAGNOSIS — F0632 Mood disorder due to known physiological condition with major depressive-like episode: Secondary | ICD-10-CM

## 2023-02-04 DIAGNOSIS — F411 Generalized anxiety disorder: Secondary | ICD-10-CM | POA: Diagnosis not present

## 2023-02-04 DIAGNOSIS — F5105 Insomnia due to other mental disorder: Secondary | ICD-10-CM | POA: Diagnosis not present

## 2023-02-04 MED ORDER — TRAZODONE HCL 100 MG PO TABS: 100.0000 mg | ORAL_TABLET | Freq: Every day | ORAL | 12 refills | Status: DC

## 2023-02-04 MED ORDER — BUPROPION HCL ER (XL) 300 MG PO TB24: 300.0000 mg | ORAL_TABLET | Freq: Every day | ORAL | 12 refills | Status: DC

## 2023-02-04 NOTE — Assessment & Plan Note (Signed)
Refer to depression plan of care. 

## 2023-02-04 NOTE — Assessment & Plan Note (Signed)
 Chronic, ongoing secondary to MS.  Restart Trazodone and collaboration with neurology and therapy.  Stop Imipramine.

## 2023-02-04 NOTE — Progress Notes (Signed)
 BP 120/83   Pulse 96   Temp 98 F (36.7 C) (Oral)   Wt 228 lb 6.4 oz (103.6 kg)   SpO2 97%   BMI 41.77 kg/m    Subjective:    Patient ID: [name], female    DOB: April 06, 1980, 42 y.o.   MRN: [id number]  HPI: [name] is a 42 y.o. female  Chief Complaint  Patient presents with   Depression   Insomnia    Patient states she has been having trouble going to sleep at night    DEPRESSION Follow-up today for increase in Wellbutrin  at visit 12/18/22 -- she feels this was working well. 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  Continues on Sertraline , Wellbutrin , Adderall , and Propranolol  as needed. Adderall  is filled by neurology. Followed by psychiatry in past, Dr. Coby.   Previously took Trazodone , but Dr. Vear (neurology), stopped this on 12/20/22 and started Imipramine . She reports this is not working and Trazodone  worked better. Mood status: stable Satisfied with current treatment?: yes Symptom severity: moderate  Duration of current treatment : chronic Side effects: no Medication compliance: good compliance Psychotherapy/counseling: yes in the past Depressed mood:  not really Anxious mood: not really Anhedonia: occasionally Significant weight loss or gain: no Insomnia: yes hard to fall asleep Fatigue: no Feelings of worthlessness or guilt: no Impaired concentration/indecisiveness: no Suicidal ideations: no Hopelessness: no Crying spells: no    02/04/2023   10:13 AM 12/18/2022    9:10 AM 06/14/2022    8:29 AM 03/26/2022    4:20 PM 12/14/2021    9:50 AM  Depression screen PHQ 2/9  Decreased Interest 1 2 1  0 1  Down, Depressed, Hopeless 1 0 1 0 0  PHQ - 2 Score 2 2 2  0 1  Altered sleeping 3 3 3  0 2  Tired, decreased energy 2 3 3  0 3  Change in appetite 2 2 3  0 3  Feeling bad or failure about yourself  0 0 0 0 1  Trouble concentrating 1 2 1  0 1  Moving slowly or  fidgety/restless 0 0 0 0 0  Suicidal thoughts 0 0 0 0 0  PHQ-9 Score 10 12 12  0 11  Difficult doing work/chores Somewhat difficult Somewhat difficult Somewhat difficult  Somewhat difficult       02/04/2023   10:13 AM 12/18/2022    9:10 AM 06/14/2022    8:30 AM 03/26/2022    4:21 PM  GAD 7 : Generalized Anxiety Score  Nervous, Anxious, on Edge 1 1 2  0  Control/stop worrying 1 0 1 0  Worry too much - different things 1 1 1  0  Trouble relaxing 1 0 1 0  Restless 0 0 1 0  Easily annoyed or irritable 2 1 1  0  Afraid - awful might happen 1 0 0 0  Total GAD 7 Score 7 3 7  0  Anxiety Difficulty Somewhat difficult Not difficult at all Somewhat difficult    Relevant past medical, surgical, family and social history reviewed and updated as indicated. Interim medical history since our last visit reviewed. Allergies and medications reviewed and updated.  Review of Systems  Constitutional:  Negative for activity change, appetite change, diaphoresis, fatigue and fever.  Respiratory:  Negative for cough, chest tightness and shortness of breath.   Cardiovascular:  Negative for chest pain, palpitations and leg swelling.  Gastrointestinal: Negative.   Neurological: Negative.   Psychiatric/Behavioral:  Positive  for sleep disturbance. Negative for decreased concentration, self-injury and suicidal ideas. The patient is nervous/anxious.    Per HPI unless specifically indicated above     Objective:    BP 120/83   Pulse 96   Temp 98 F (36.7 C) (Oral)   Wt 228 lb 6.4 oz (103.6 kg)   SpO2 97%   BMI 41.77 kg/m   Wt Readings from Last 3 Encounters:  02/04/23 228 lb 6.4 oz (103.6 kg)  12/20/22 226 lb (102.5 kg)  12/18/22 226 lb 3.2 oz (102.6 kg)    Physical Exam Vitals and nursing note reviewed.  Constitutional:      General: She is awake. She is not in acute distress.    Appearance: She is well-developed and well-groomed. She is obese. She is not ill-appearing or toxic-appearing.  HENT:      Head: Normocephalic.     Right Ear: Hearing and external ear normal.     Left Ear: Hearing and external ear normal.  Eyes:     General: Lids are normal.        Right eye: No discharge.        Left eye: No discharge.     Conjunctiva/sclera: Conjunctivae normal.     Pupils: Pupils are equal, round, and reactive to light.  Neck:     Thyroid : No thyromegaly.     Vascular: No carotid bruit.  Cardiovascular:     Rate and Rhythm: Normal rate and regular rhythm.     Heart sounds: Normal heart sounds. No murmur heard.    No gallop.  Pulmonary:     Effort: Pulmonary effort is normal. No accessory muscle usage or respiratory distress.     Breath sounds: Normal breath sounds.  Abdominal:     General: Bowel sounds are normal. There is no distension.     Palpations: Abdomen is soft.     Tenderness: There is no abdominal tenderness.  Musculoskeletal:     Cervical back: Normal range of motion and neck supple.     Right lower leg: No edema.     Left lower leg: No edema.  Lymphadenopathy:     Cervical: No cervical adenopathy.  Skin:    General: Skin is warm and dry.  Neurological:     Mental Status: She is alert and oriented to person, place, and time.     Deep Tendon Reflexes: Reflexes are normal and symmetric.     Reflex Scores:      Brachioradialis reflexes are 2+ on the right side and 2+ on the left side.      Patellar reflexes are 2+ on the right side and 2+ on the left side. Psychiatric:        Attention and Perception: Attention normal.        Mood and Affect: Mood normal.        Speech: Speech normal.        Behavior: Behavior normal. Behavior is cooperative.        Thought Content: Thought content normal.    Results for orders placed or performed in visit on 12/18/22  VITAMIN D  25 Hydroxy (Vit-D Deficiency, Fractures)   Collection Time: 12/18/22 10:28 AM  Result Value Ref Range   Vit D, 25-Hydroxy 59.2 30.0 - 100.0 ng/mL  TSH   Collection Time: 12/18/22 10:28 AM  Result  Value Ref Range   TSH 1.080 0.450 - 4.500 uIU/mL  Lipid Panel w/o Chol/HDL Ratio   Collection Time: 12/18/22 10:28 AM  Result Value  Ref Range   Cholesterol, Total 180 100 - 199 mg/dL   Triglycerides 888 0 - 149 mg/dL   HDL 58 >60 mg/dL   VLDL Cholesterol Cal 20 5 - 40 mg/dL   LDL Chol Calc (NIH) 897 (H) 0 - 99 mg/dL  Comprehensive metabolic panel   Collection Time: 12/18/22 10:28 AM  Result Value Ref Range   Glucose 91 70 - 99 mg/dL   BUN 9 6 - 24 mg/dL   Creatinine, Ser 9.30 0.57 - 1.00 mg/dL   eGFR 889 >40 fO/fpw/8.26   BUN/Creatinine Ratio 13 9 - 23   Sodium 142 134 - 144 mmol/L   Potassium 3.9 3.5 - 5.2 mmol/L   Chloride 101 96 - 106 mmol/L   CO2 24 20 - 29 mmol/L   Calcium 9.4 8.7 - 10.2 mg/dL   Total Protein 6.9 6.0 - 8.5 g/dL   Albumin 4.6 3.9 - 4.9 g/dL   Globulin, Total 2.3 1.5 - 4.5 g/dL   Bilirubin Total 0.3 0.0 - 1.2 mg/dL   Alkaline Phosphatase 129 (H) 44 - 121 IU/L   AST 15 0 - 40 IU/L   ALT 15 0 - 32 IU/L  CBC with Differential/Platelet   Collection Time: 12/18/22 10:28 AM  Result Value Ref Range   WBC 8.6 3.4 - 10.8 x10E3/uL   RBC 5.26 3.77 - 5.28 x10E6/uL   Hemoglobin 13.8 11.1 - 15.9 g/dL   Hematocrit 56.6 65.9 - 46.6 %   MCV 82 79 - 97 fL   MCH 26.2 (L) 26.6 - 33.0 pg   MCHC 31.9 31.5 - 35.7 g/dL   RDW 86.7 88.2 - 84.5 %   Platelets 459 (H) 150 - 450 x10E3/uL   Neutrophils 69 Not Estab. %   Lymphs 20 Not Estab. %   Monocytes 7 Not Estab. %   Eos 3 Not Estab. %   Basos 1 Not Estab. %   Neutrophils Absolute 6.0 1.4 - 7.0 x10E3/uL   Lymphocytes Absolute 1.7 0.7 - 3.1 x10E3/uL   Monocytes Absolute 0.6 0.1 - 0.9 x10E3/uL   EOS (ABSOLUTE) 0.2 0.0 - 0.4 x10E3/uL   Basophils Absolute 0.0 0.0 - 0.2 x10E3/uL   Immature Granulocytes 0 Not Estab. 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  Denies SI/HI.  Followed by psychiatry in past, currently PCP follows. Will continue Wellbutrin  XL 300 MG daily and maintain Zoloft  at current dose.  Restart Trazodone  for sleep and stop Imipramine . Continue with therapist.          Other   GAD (generalized anxiety disorder)   Refer to depression plan of care.      Relevant Medications   buPROPion  (WELLBUTRIN  XL) 300 MG 24 hr tablet   traZODone  (DESYREL ) 100 MG tablet   Insomnia due to mental disorder   Chronic, ongoing secondary to MS.  Restart Trazodone  and collaboration with neurology and therapy.  Stop Imipramine .        Follow up plan: Return in about 5 months (around 07/05/2023) for Depression, SLEEP, MS.

## 2023-02-04 NOTE — Assessment & Plan Note (Signed)
 Chronic, improving.  Denies SI/HI.  Followed by psychiatry in past, currently PCP follows. Will continue Wellbutrin XL 300 MG daily and maintain Zoloft at current dose.  Restart Trazodone for sleep and stop Imipramine. Continue with therapist.

## 2023-02-07 ENCOUNTER — Encounter: Payer: Self-pay | Admitting: Nurse Practitioner

## 2023-02-08 NOTE — Telephone Encounter (Signed)
 Rx discontinued 02/04/23 due to change in dose, refill not appropriate. Requested Prescriptions  Pending Prescriptions Disp Refills   buPROPion  (WELLBUTRIN  XL) 150 MG 24 hr tablet [Pharmacy Med Name: BUPROPION  HCL ER (XL) 150 MG TAB] 30 tablet 0    Sig: TAKE ONE TABLET BY MOUTH ONCE DAILY     Psychiatry: Antidepressants - bupropion  Passed - 02/08/2023  9:17 AM      Passed - Cr in normal range and within 360 days    Creatinine  Date Value Ref Range Status  07/28/2013 0.66 0.60 - 1.30 mg/dL Final   Creatinine, Ser  Date Value Ref Range Status  12/18/2022 0.69 0.57 - 1.00 mg/dL Final         Passed - AST in normal range and within 360 days    AST  Date Value Ref Range Status  12/18/2022 15 0 - 40 IU/L Final   SGOT(AST)  Date Value Ref Range Status  07/25/2013 20 15 - 37 Unit/L Final         Passed - ALT in normal range and within 360 days    ALT  Date Value Ref Range Status  12/18/2022 15 0 - 32 IU/L Final   SGPT (ALT)  Date Value Ref Range Status  07/25/2013 18 12 - 78 U/L Final         Passed - Last BP in normal range    BP Readings from Last 1 Encounters:  02/04/23 120/83         Passed - Valid encounter within last 6 months    Recent Outpatient Visits           4 days ago Depressive disorder due to another medical condition with major depressive-like episode   [place] [facility] [place], Melanie T, NP   1 month ago Multiple sclerosis (HCC)   [place] [facility] [place], Melanie T, NP   7 months ago Multiple sclerosis (HCC)   [place] [facility] [place], Melanie T, NP   10 months ago Lab test positive for detection of COVID-19 virus   [place] [facility] [place], Melanie T, NP   1 year ago Multiple sclerosis [facility])   [place] [facility] [place], Melanie DASEN, NP       Future Appointments             In 4 months Cannady, Jolene T, NP  [facility]

## 2023-02-11 ENCOUNTER — Encounter: Payer: Self-pay | Admitting: Nurse Practitioner

## 2023-02-21 ENCOUNTER — Other Ambulatory Visit: Payer: Self-pay | Admitting: Neurology

## 2023-02-25 ENCOUNTER — Other Ambulatory Visit: Payer: Self-pay

## 2023-02-25 MED ORDER — AMPHETAMINE-DEXTROAMPHET ER 30 MG PO CP24: 30.0000 mg | ORAL_CAPSULE | Freq: Every day | ORAL | 0 refills | Status: DC

## 2023-02-25 NOTE — Telephone Encounter (Signed)
Pt Last Seen 12/20/2022 (Dr. Epimenio Foot) Upcoming Appointment 07/22/2023 (NP, Amy Lomax)  Adderall 30mg  Last Filled 09/09/2022 Escript 02/25/2023

## 2023-03-18 ENCOUNTER — Other Ambulatory Visit: Payer: Self-pay

## 2023-03-18 ENCOUNTER — Other Ambulatory Visit: Payer: Self-pay | Admitting: Neurology

## 2023-03-29 ENCOUNTER — Other Ambulatory Visit: Payer: Self-pay | Admitting: Neurology

## 2023-04-01 MED ORDER — AMPHETAMINE-DEXTROAMPHET ER 30 MG PO CP24: 30.0000 mg | ORAL_CAPSULE | Freq: Every day | ORAL | 0 refills | Status: DC

## 2023-04-01 NOTE — Telephone Encounter (Signed)
 Last seen 12/20/22 and next f/u 07/22/23. Last refilled 09/09/22 #30.

## 2023-04-14 ENCOUNTER — Telehealth: Payer: Self-pay | Admitting: Pharmacy Technician

## 2023-04-14 ENCOUNTER — Other Ambulatory Visit (HOSPITAL_COMMUNITY): Payer: Self-pay

## 2023-04-14 NOTE — Telephone Encounter (Signed)
 Pharmacy Patient Advocate Encounter  Received notification from [facility] that Prior Authorization for Amphetamine-Dextroamphet ER 30MG  er capsules has been APPROVED from 04-14-2023 to 04-13-2026   PA #/Case ID/Reference #: [id number]

## 2023-04-14 NOTE — Telephone Encounter (Signed)
 Pharmacy Patient Advocate Encounter   Received notification from CoverMyMeds that prior authorization for Amphetamine-Dextroamphet ER 30MG  er capsules is required/requested.   Insurance verification completed.   The patient is insured through [facility] .   Per test claim: PA required; PA started via CoverMyMeds. KEY [id number] . Waiting for clinical questions to populate.

## 2023-04-14 NOTE — Telephone Encounter (Signed)
 Pharmacy Patient Advocate Encounter   Received notification from Pt Calls Messages that prior authorization for Amphetamine-Dextroamphet ER 30MG  er capsules is required/requested.   Insurance verification completed.   The patient is insured through [facility] .   Per test claim: PA required; PA submitted to above mentioned insurance via CoverMyMeds Key/confirmation #/EOC [id number] Status is pending

## 2023-04-29 ENCOUNTER — Encounter: Payer: Self-pay | Admitting: Emergency Medicine

## 2023-04-29 ENCOUNTER — Other Ambulatory Visit: Payer: Self-pay

## 2023-04-29 ENCOUNTER — Emergency Department

## 2023-04-29 ENCOUNTER — Encounter: Payer: Self-pay | Admitting: Nurse Practitioner

## 2023-04-29 ENCOUNTER — Emergency Department
Admission: EM | Admit: 2023-04-29 | Discharge: 2023-04-29 | Disposition: A | Discharge status: Home / Self Care | Attending: Emergency Medicine | Admitting: Emergency Medicine

## 2023-04-29 DIAGNOSIS — Z3200 Encounter for pregnancy test, result unknown: Secondary | ICD-10-CM

## 2023-04-29 DIAGNOSIS — R109 Unspecified abdominal pain: Secondary | ICD-10-CM | POA: Diagnosis present

## 2023-04-29 DIAGNOSIS — R000 Tachycardia, unspecified: Secondary | ICD-10-CM | POA: Diagnosis not present

## 2023-04-29 DIAGNOSIS — R1013 Epigastric pain: Secondary | ICD-10-CM | POA: Diagnosis not present

## 2023-04-29 DIAGNOSIS — D72829 Elevated white blood cell count, unspecified: Secondary | ICD-10-CM | POA: Insufficient documentation

## 2023-04-29 DIAGNOSIS — R42 Dizziness and giddiness: Secondary | ICD-10-CM | POA: Insufficient documentation

## 2023-04-29 LAB — COMPREHENSIVE METABOLIC PANEL
ALT: 30 U/L (ref 0–44)
AST: 23 U/L (ref 15–41)
Albumin: 3.6 g/dL (ref 3.5–5.0)
Alkaline Phosphatase: 90 U/L (ref 38–126)
Anion gap: 10 (ref 5–15)
BUN: 12 mg/dL (ref 6–20)
CO2: 24 mmol/L (ref 22–32)
Calcium: 9 mg/dL (ref 8.9–10.3)
Chloride: 105 mmol/L (ref 98–111)
Creatinine, Ser: 0.76 mg/dL (ref 0.44–1.00)
GFR, Estimated: >60 mL/min (ref >60)
Glucose, Bld: 115 mg/dL — ABNORMAL HIGH (ref 70–99)
Potassium: 3.8 mmol/L (ref 3.5–5.1)
Sodium: 139 mmol/L (ref 135–145)
Total Bilirubin: 0.5 mg/dL (ref 0.0–1.2)
Total Protein: 6.8 g/dL (ref 6.5–8.1)

## 2023-04-29 LAB — URINALYSIS, ROUTINE W REFLEX MICROSCOPIC
Bilirubin Urine: NEGATIVE
Glucose, UA: NEGATIVE mg/dL
Hgb urine dipstick: NEGATIVE
Ketones, ur: NEGATIVE mg/dL
Leukocytes,Ua: NEGATIVE
Nitrite: NEGATIVE
Protein, ur: NEGATIVE mg/dL
Specific Gravity, Urine: 1.02 (ref 1.005–1.030)
pH: 6 (ref 5.0–8.0)

## 2023-04-29 LAB — CBC
HCT: 40.7 % (ref 36.0–46.0)
Hemoglobin: 13.2 g/dL (ref 12.0–15.0)
MCH: 25.3 pg — ABNORMAL LOW (ref 26.0–34.0)
MCHC: 32.4 g/dL (ref 30.0–36.0)
MCV: 78.1 fL — ABNORMAL LOW (ref 80.0–100.0)
Platelets: 407 10*3/uL — ABNORMAL HIGH (ref 150–400)
RBC: 5.21 MIL/uL — ABNORMAL HIGH (ref 3.87–5.11)
RDW: 14.6 % (ref 11.5–15.5)
WBC: 11.9 10*3/uL — ABNORMAL HIGH (ref 4.0–10.5)
nRBC: 0 % (ref 0.0–0.2)

## 2023-04-29 LAB — HCG, QUANTITATIVE, PREGNANCY: hCG, Beta Chain, Quant, S: 5 m[IU]/mL — ABNORMAL HIGH (ref <5)

## 2023-04-29 LAB — LIPASE, BLOOD: Lipase: 46 U/L (ref 11–51)

## 2023-04-29 LAB — POC URINE PREG, ED: Preg Test, Ur: NEGATIVE

## 2023-04-29 MED ORDER — MECLIZINE HCL 25 MG PO TABS: 25.0000 mg | ORAL_TABLET | Freq: Three times a day (TID) | ORAL | 0 refills | Status: AC | PRN

## 2023-04-29 MED ORDER — ONDANSETRON HCL 4 MG/2ML IJ SOLN
4.0000 mg | Freq: Once | INTRAMUSCULAR | Status: AC
  Administered 2023-04-29: 4 mg via INTRAVENOUS
  Filled 2023-04-29: qty 2

## 2023-04-29 MED ORDER — SODIUM CHLORIDE 0.9 % IV BOLUS
1000.0000 mL | Freq: Once | INTRAVENOUS | Status: AC
  Administered 2023-04-29: 1000 mL via INTRAVENOUS

## 2023-04-29 MED ORDER — IOHEXOL 300 MG/ML  SOLN
100.0000 mL | Freq: Once | INTRAMUSCULAR | Status: AC | PRN
  Administered 2023-04-29: 100 mL via INTRAVENOUS

## 2023-04-29 MED ORDER — MECLIZINE HCL 25 MG PO TABS
25.0000 mg | ORAL_TABLET | Freq: Once | ORAL | Status: AC
  Administered 2023-04-29: 25 mg via ORAL
  Filled 2023-04-29: qty 1

## 2023-04-29 MED ORDER — SUCRALFATE 1 G PO TABS: 1.0000 g | ORAL_TABLET | Freq: Three times a day (TID) | ORAL | 0 refills | Status: DC | PRN

## 2023-04-29 MED ORDER — OMEPRAZOLE MAGNESIUM 20 MG PO TBEC
20.0000 mg | DELAYED_RELEASE_TABLET | Freq: Every day | ORAL | 0 refills | Status: DC
Start: 2023-04-29 — End: 2023-06-29

## 2023-04-29 MED ORDER — MORPHINE SULFATE (PF) 4 MG/ML IV SOLN
4.0000 mg | Freq: Once | INTRAVENOUS | Status: AC
  Administered 2023-04-29: 4 mg via INTRAVENOUS
  Filled 2023-04-29: qty 1

## 2023-04-29 NOTE — ED Provider Notes (Signed)
 [facility] Provider Note    Event Date/Time   First MD Initiated Contact with Patient 04/29/23 1115     (approximate)   History   Abdominal Pain   HPI  [name] is a 43 year old female with history of MS, migraines presenting to the emergency department for evaluation of abdominal pain.  This morning, patient had onset of upper abdominal pain with associated diarrhea.  Reports nausea without vomiting.  Denies headache.  Does report some dizziness described as a spinning sensation as well as some lightheadedness without syncope.  No double vision.     Physical Exam   Triage Vital Signs: ED Triage Vitals  Encounter Vitals Group     BP 04/29/23 1056 118/80     Systolic BP Percentile --      Diastolic BP Percentile --      Pulse Rate 04/29/23 1056 (!) 103     Resp 04/29/23 1056 18     Temp 04/29/23 1056 98.4 F (36.9 C)     Temp Source 04/29/23 1056 Oral     SpO2 04/29/23 1056 97 %     Weight 04/29/23 1057 225 lb (102.1 kg)     Height 04/29/23 1057 5\' 3"  (1.6 m)     Head Circumference --      Peak Flow --      Pain Score 04/29/23 1105 7     Pain Loc --      Pain Education --      Exclude from Growth Chart --     Most recent vital signs: Vitals:   04/29/23 1056  BP: 118/80  Pulse: (!) 103  Resp: 18  Temp: 98.4 F (36.9 C)  SpO2: 97%     General: Awake, interactive  CV:  Regular rate, good peripheral perfusion.  Resp:  Unlabored respirations, lungs good auscultation Abd:  Nondistended, diffuse tenderness palpation most notable over the upper abdomen without rebound or guarding Neuro:  Symmetric facial movement, fluid speech, moves all extremities spontaneously and equally, does have reproducible vertiginous symptoms with turning her head left to right   ED Results / Procedures / Treatments   Labs (all labs ordered are listed, but only abnormal results are displayed) Labs Reviewed  COMPREHENSIVE METABOLIC PANEL -  Abnormal; Notable for the following components:      Result Value   Glucose, Bld 115 (*)    All other components within normal limits  CBC - Abnormal; Notable for the following components:   WBC 11.9 (*)    RBC 5.21 (*)    MCV 78.1 (*)    MCH 25.3 (*)    Platelets 407 (*)    All other components within normal limits  URINALYSIS, ROUTINE W REFLEX MICROSCOPIC - Abnormal; Notable for the following components:   Color, Urine YELLOW (*)    APPearance CLEAR (*)    All other components within normal limits  LIPASE, BLOOD  HCG, QUANTITATIVE, PREGNANCY  POC URINE PREG, ED     EKG EKG independently reviewed interpreted by myself (ER attending) demonstrates:  EKG demonstrates normal sinus rhythm rate of 98, PR 150, QRS 84, QTc 434, no acute ST changes  RADIOLOGY Imaging independently reviewed and interpreted by myself demonstrates:  CT abdomen pelvis without SBO on my review, formal radiology read pending  PROCEDURES:  Critical Care performed: No  Procedures   MEDICATIONS ORDERED IN ED: Medications  ondansetron (ZOFRAN) injection 4 mg (4 mg Intravenous Given 04/29/23 1205)  sodium chloride 0.9 %  bolus 1,000 mL (0 mLs Intravenous Stopped 04/29/23 1301)  morphine (PF) 4 MG/ML injection 4 mg (4 mg Intravenous Given 04/29/23 1205)  iohexol (OMNIPAQUE) 300 MG/ML solution 100 mL (100 mLs Intravenous Contrast Given 04/29/23 1334)  meclizine (ANTIVERT) tablet 25 mg (25 mg Oral Given 04/29/23 1344)     IMPRESSION / MDM / ASSESSMENT AND PLAN / ED COURSE  I reviewed the triage vital signs and the nursing notes.  Differential diagnosis includes, but is not limited to, gastritis, biliary pathology, colitis, other acute intra-abdominal process, regarding dizziness consideration for dehydration, peripheral vertigo, does have history of MS, but denies new neurologic symptoms from baseline  Patient's presentation is most consistent with acute presentation with potential threat to life or bodily  function.  43 year old female presenting with abdominal pain.  Mild tachycardia on presentation.  Labs with mild leukocytosis with WBC of 11.9.  Ordered for IV fluids, morphine, Zofran for symptomatic treatment.  On reevaluation, patient does report improvement in her abdominal pain, but does report some ongoing vertigo symptoms.  Ordered for meclizine.  On reevaluation, reports significant improvement in her dizziness.  She does have a history of MS, but exam seems more consistent with peripheral process.  With improvement in symptoms, do think it is reasonable to hold off on intracranial imaging.  CT abdomen pelvis ordered for further evaluation of her abdominal pain, read pending.  If reassuring, anticipate patient may be stable for discharge with outpatient follow-up.  Signed out to oncoming physician at 1515 pending CT and disposition.     FINAL CLINICAL IMPRESSION(S) / ED DIAGNOSES   Final diagnoses:  Epigastric pain  Dizziness     Rx / DC Orders   ED Discharge Orders          Ordered    meclizine (ANTIVERT) 25 MG tablet  3 times daily PRN        04/29/23 1513    sucralfate (CARAFATE) 1 g tablet  Every 8 hours PRN        04/29/23 1513    omeprazole (PRILOSEC OTC) 20 MG tablet  Daily        04/29/23 1513             Note:  This document was prepared using Dragon voice recognition software and may include unintentional dictation errors.   Trinna Post, MD 04/29/23 [phone]

## 2023-04-29 NOTE — ED Triage Notes (Signed)
 Patient to ED via POV for upper abd pain. Started this AM. States she has been having diarrhea as well. Also having upper back pain.

## 2023-04-29 NOTE — ED Provider Notes (Signed)
.-----------------------------------------   3:37 PM on 04/29/2023 -----------------------------------------  Blood pressure 122/85, pulse 89, temperature 98.3 F (36.8 C), temperature source Oral, resp. rate 16, height 5\' 3"  (1.6 m), weight 102.1 kg, SpO2 100%.  Assuming care from Dr. Rosalia Hammers.  In short, [name] is a 43 y.o. female with a chief complaint of Abdominal Pain .  Refer to the original H&P for additional details.  The current plan of care is to follow-up CT, beta quant, reassess, if symptoms improve, likely able to be discharged..  Of note, urine pregnancy test was initially negative, when the nurse went to throughout the sample a while later, noted that was very likely positive, beta-hCG was sent that showed a level of 5.  Did have extensive discussion with patient and and has been about hCG results as well as labs and imaging results, recommended that he follow-up with her primary care doctor in 2 to 3 days to get retested.  She is feeling a lot better, ready for discharge.  Shared decision making and she is agreeable with the plan.  Considered but no indication for inpatient admission at this time, she is safe for outpatient management.  Dr. Rosalia Hammers had already sent prescriptions to her pharmacy.  Discharged with strict precautions.  Clinical Course as of 04/29/23 1558  Tue Apr 29, 2023  1537 CT ABDOMEN PELVIS W CONTRAST 1. No acute intra-abdominal process.  [TT]  1556 Independent review of labs, UA is not consistent with UTI, mild leukocytosis, electrolytes not severely deranged, LFTs are normal, lipase is normal. [TT]    Clinical Course User Index [TT] Jodie Echevaria Franchot Erichsen, MD      Claybon Jabs, MD 04/29/23 [phone]

## 2023-04-30 NOTE — Telephone Encounter (Signed)
 Lab appt scheduled.

## 2023-05-02 ENCOUNTER — Other Ambulatory Visit

## 2023-05-02 DIAGNOSIS — Z3200 Encounter for pregnancy test, result unknown: Secondary | ICD-10-CM

## 2023-05-03 ENCOUNTER — Encounter: Payer: Self-pay | Admitting: Nurse Practitioner

## 2023-05-03 LAB — BETA HCG QUANT (REF LAB): hCG Quant: 4 m[IU]/mL

## 2023-05-03 NOTE — Progress Notes (Signed)
 Contacted via MyChart   Good news!!  Your pregnancy testing is negative:)

## 2023-05-05 ENCOUNTER — Encounter: Payer: Self-pay | Admitting: Neurology

## 2023-05-05 ENCOUNTER — Ambulatory Visit
Admission: RE | Admit: 2023-05-05 | Discharge: 2023-05-05 | Disposition: A | Discharge status: Home / Self Care | Source: Ambulatory Visit

## 2023-05-05 VITALS — BP 133/84 | HR 101 | Temp 98.0°F | Resp 18

## 2023-05-05 DIAGNOSIS — J0100 Acute maxillary sinusitis, unspecified: Secondary | ICD-10-CM

## 2023-05-05 MED ORDER — AMOXICILLIN 875 MG PO TABS: 875.0000 mg | ORAL_TABLET | Freq: Two times a day (BID) | ORAL | 0 refills | Status: AC

## 2023-05-05 MED ORDER — AMPHETAMINE-DEXTROAMPHET ER 30 MG PO CP24: 30.0000 mg | ORAL_CAPSULE | Freq: Every day | ORAL | 0 refills | Status: DC

## 2023-05-05 NOTE — Telephone Encounter (Signed)
 Last seen on 12/20/22 Follow up scheduled on 07/22/23 Last filled on 04/01/23 Rx pending to be signed

## 2023-05-05 NOTE — ED Provider Notes (Signed)
 [name]    CSN: [id number] Arrival date & time: 05/05/23  1047      History   Chief Complaint Chief Complaint  Patient presents with   Cough    -sinus pressure and congestion for over a week. Mucus is slightly green.  *took Musinex for 5 days, worse cough, slightly better congestion. Taking Sudafed sinus, still have horrible cough that causes me to loose my breath. *have seasonal allergies - Entered by patient    HPI [name] is a 43 y.o. female.  Patient presents with 10-day history of sinus congestion, sinus pressure, runny nose, postnasal drip, cough.  Her cough is nonproductive.  No fever or shortness of breath.  Treatment attempted with Mucinex and Sudafed.  Patient was seen at [facility] ED on 04/29/2023; diagnosed with epigastric pain and dizziness; discharged on meclizine, omeprazole, sucralfate.  She reports improvement in the symptoms.  The history is provided by the patient and medical records.    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    Obstetric Comments  IUD - placed 2016          Home Medications    Prior to Admission medications   Medication Sig Start Date End Date Taking? Authorizing Provider  amoxicillin (AMOXIL) 875 MG tablet Take 1 tablet (875 mg total) by mouth 2 (two) times daily for 10 days. 05/05/23 05/15/23 Yes Mickie Bail, NP  omeprazole (PRILOSEC) 20 MG capsule Take 20 mg by mouth daily. 04/29/23  Yes [provider]  albuterol (VENTOLIN HFA) 108 (90 Base) MCG/ACT inhaler TAKE 2 PUFFS BY MOUTH EVERY 6 HOURS AS NEEDED FOR WHEEZE OR SHORTNESS OF BREATH 03/09/20   Cannady, Jolene T, NP  amphetamine-dextroamphetamine (ADDERALL XR) 30 MG 24 hr capsule Take 1 capsule (30 mg total) by mouth daily. 04/01/23   Sater, Pearletha Furl, MD  baclofen (LIORESAL) 10 MG tablet TAKE 1 TABLET BY MOUTH FOUR TIMES DAILY AS NEEDED FOR MUSCLE SPASMS 03/18/23   Sater, Pearletha Furl, MD  buPROPion (WELLBUTRIN XL) 300 MG 24 hr tablet Take 1 tablet (300 mg total) by mouth daily. 02/04/23   Aura Dials T, NP  Cholecalciferol 25 MCG (1000 UT) tablet Take 5,000 Units by mouth daily.     [provider]  imipramine (TOFRANIL) 25 MG tablet Take 25 mg by mouth at bedtime. Patient not taking: Reported on  05/05/2023 05/01/23   [provider]  meclizine (ANTIVERT) 25 MG tablet Take 1 tablet (25 mg total) by mouth 3 (three) times daily as needed for dizziness. 04/29/23   Trinna Post, MD  ocrelizumab 600 mg in sodium chloride 0.9 % 500 mL Inject 600 mg into the vein every 6 (six) months.    [provider]  omeprazole (PRILOSEC OTC) 20 MG tablet Take 1 tablet (20 mg total) by mouth daily for 14 days. 04/29/23 05/13/23  Trinna Post, MD  propranolol (INDERAL) 10 MG tablet Take 1 tablet (10 mg total) by mouth daily as needed. For severe anxiety  attack 12/18/22   Aura Dials T, NP  sertraline (ZOLOFT) 100 MG tablet Take 2 tablets (200 mg total) by mouth daily. 12/18/22   Cannady, Corrie Dandy T, NP  sucralfate (CARAFATE) 1 g tablet Take 1 tablet (1 g total) by mouth every 8 (eight) hours as needed for up to 10 days. 04/29/23 05/09/23  Trinna Post, MD  traZODone (DESYREL) 100 MG tablet Take 1 tablet (100 mg total) by mouth at bedtime. 02/04/23   Marjie Skiff, NP    Family History Family History  Problem Relation Age of Onset   Multiple myeloma Mother    Multiple sclerosis Mother    Obesity Sister    Anxiety disorder Daughter    Multiple sclerosis Maternal Aunt     Social History Social History   Tobacco Use   Smoking status: Never   Smokeless tobacco: Never  Vaping Use   Vaping status: Never Used  Substance Use Topics   Alcohol use: Not Currently    Alcohol/week: 0.0 standard drinks of alcohol    Comment: occasional/fim   Drug use: No     Allergies   Flexeril [cyclobenzaprine]   Review of Systems Review of Systems  Constitutional:  Negative for chills and fever.  HENT:  Positive for congestion, postnasal drip, rhinorrhea and sinus pressure. Negative for ear pain and sore throat.   Respiratory:  Positive for cough. Negative for shortness of breath.      Physical Exam Triage Vital Signs ED Triage Vitals  Encounter Vitals Group     BP      Systolic BP Percentile      Diastolic BP Percentile      Pulse      Resp      Temp      Temp src      SpO2      Weight      Height      Head Circumference      Peak Flow      Pain Score      Pain Loc      Pain Education      Exclude from Growth Chart    No data found.  Updated Vital Signs BP 133/84   Pulse (!) 101   Temp 98 F (36.7 C)   Resp 18   SpO2 99%   Visual Acuity Right Eye Distance:   Left Eye Distance:   Bilateral Distance:    Right Eye Near:   Left Eye Near:    Bilateral Near:     Physical Exam Constitutional:      General: She is  not in acute distress. HENT:     Right Ear: Tympanic membrane normal.     Left Ear: Tympanic membrane normal.     Nose: Congestion and rhinorrhea present.     Mouth/Throat:     Mouth: Mucous membranes are  moist.     Pharynx: Oropharynx is clear.  Cardiovascular:     Rate and Rhythm: Normal rate and regular rhythm.     Heart sounds: Normal heart sounds.  Pulmonary:     Effort: Pulmonary effort is normal. No respiratory distress.     Breath sounds: Normal breath sounds.  Neurological:     Mental Status: She is alert.      UC Treatments / Results  Labs (all labs ordered are listed, but only abnormal results are displayed) Labs Reviewed - No data to display  EKG   Radiology No results found.  Procedures Procedures (including critical care time)  Medications Ordered in UC Medications - No data to display  Initial Impression / Assessment and Plan / UC Course  I have reviewed the triage vital signs and the nursing notes.  Pertinent labs & imaging results that were available during my care of the patient were reviewed by me and considered in my medical decision making (see chart for details).    Acute sinusitis.  Patient has been symptomatic for 10 days and is not improving with OTC treatment.  Treating today with amoxicillin.  Tylenol or ibuprofen as needed.  Plain Mucinex as needed.  Education provided on sinus infection.  Instructed patient to follow up with her PCP if her symptoms are not improving.  She agrees to plan of care.   Final Clinical Impressions(s) / UC Diagnoses   Final diagnoses:  Acute non-recurrent maxillary sinusitis     Discharge Instructions      Take the amoxicillin as directed.  Follow-up with your primary care provider if your symptoms are not improving.      ED Prescriptions     Medication Sig Dispense Auth. Provider   amoxicillin (AMOXIL) 875 MG tablet Take 1 tablet (875 mg total) by mouth 2 (two) times daily for 10 days. 20 tablet  Mickie Bail, NP      PDMP not reviewed this encounter.   Mickie Bail, NP 05/05/23 [phone]

## 2023-05-05 NOTE — ED Triage Notes (Signed)
 Patient to Urgent Care with complaints of persistent cough/ congestion/ sinus pressure.  Symptoms x10 days. Feels like her symptoms are worsening.   Taking Mucinex/ Sudafed.

## 2023-05-05 NOTE — Discharge Instructions (Addendum)
 Take the amoxicillin as directed.  Follow up with your primary care provider if your symptoms are not improving.

## 2023-06-04 ENCOUNTER — Other Ambulatory Visit: Payer: Self-pay | Admitting: Nurse Practitioner

## 2023-06-04 ENCOUNTER — Other Ambulatory Visit: Payer: Self-pay | Admitting: Neurology

## 2023-06-04 MED ORDER — AMPHETAMINE-DEXTROAMPHET ER 30 MG PO CP24: 30.0000 mg | ORAL_CAPSULE | Freq: Every day | ORAL | 0 refills | Status: DC

## 2023-06-04 NOTE — Telephone Encounter (Signed)
 Last seen on 12/20/22 Follow up scheduled on 07/22/23   Dispensed Days Supply Quantity Provider Pharmacy  AMPH/DEX (A) 30MG  ER CAP 05/05/2023 30 30 capsule Sater, Sherida Dimmer, MD [facility] - ...      Rx pending to be signed

## 2023-06-07 NOTE — Telephone Encounter (Signed)
 Too soon for refill, last refill 02/03/23 for 30 and 12 refills.  Requested Prescriptions  Pending Prescriptions Disp Refills   buPROPion  (WELLBUTRIN  XL) 150 MG 24 hr tablet [Pharmacy Med Name: BUPROPION  HCL ER (XL) 150 MG TAB] 30 tablet     Sig: TAKE ONE TABLET BY MOUTH ONCE DAILY     Psychiatry: Antidepressants - bupropion  Failed - 06/07/2023  8:58 AM      Failed - Valid encounter within last 6 months    Recent Outpatient Visits   None     Future Appointments             In 3 weeks Cannady, Lavelle Posey, NP [facility]            Passed - Cr in normal range and within 360 days    Creatinine  Date Value Ref Range Status  07/28/2013 0.66 0.60 - 1.30 mg/dL Final   Creatinine, Ser  Date Value Ref Range Status  04/29/2023 0.76 0.44 - 1.00 mg/dL Final         Passed - AST in normal range and within 360 days    AST  Date Value Ref Range Status  04/29/2023 23 15 - 41 U/L Final   SGOT(AST)  Date Value Ref Range Status  07/25/2013 20 15 - 37 Unit/L Final         Passed - ALT in normal range and within 360 days    ALT  Date Value Ref Range Status  04/29/2023 30 0 - 44 U/L Final   SGPT (ALT)  Date Value Ref Range Status  07/25/2013 18 12 - 78 U/L Final         Passed - Last BP in normal range    BP Readings from Last 1 Encounters:  05/05/23 133/84

## 2023-06-13 ENCOUNTER — Encounter: Payer: Self-pay | Admitting: Nurse Practitioner

## 2023-06-14 MED ORDER — TRAZODONE HCL 100 MG PO TABS
100.0000 mg | ORAL_TABLET | Freq: Every day | ORAL | 12 refills | Status: AC
Start: 2023-06-14 — End: ?

## 2023-06-14 MED ORDER — BUPROPION HCL ER (XL) 300 MG PO TB24: 300.0000 mg | ORAL_TABLET | Freq: Every day | ORAL | 12 refills | Status: AC

## 2023-06-29 NOTE — Patient Instructions (Signed)
 Be Involved in Caring For Your Health:  Taking Medications When medications are taken as directed, they can greatly improve your health. But if they are not taken as prescribed, they may not work. In some cases, not taking them correctly can be harmful. To help ensure your treatment remains effective and safe, understand your medications and how to take them. Bring your medications to each visit for review by your provider.  Your lab results, notes, and after visit summary will be available on My Chart. We strongly encourage you to use this feature. If lab results are abnormal the clinic will contact you with the appropriate steps. If the clinic does not contact you assume the results are satisfactory. You can always view your results on My Chart. If you have questions regarding your health or results, please contact the clinic during office hours. You can also ask questions on My Chart.  We at [facility] are grateful that you chose [place] to provide your care. We strive to provide evidence-based and compassionate care and are always looking for feedback. If you get a survey from the clinic please complete this so we can hear your opinions.  Healthy Eating, Adult Healthy eating may help you get and keep a healthy body weight, reduce the risk of chronic disease, and live a long and productive life. It is important to follow a healthy eating pattern. Your nutritional and calorie needs should be met mainly by different nutrient-rich foods. What are tips for following this plan? Reading food labels Read labels and choose the following: Reduced or low sodium products. Juices with 100% fruit juice. Foods with low saturated fats (<3 g per serving) and high polyunsaturated and monounsaturated fats. Foods with whole grains, such as whole wheat, cracked wheat, brown rice, and wild rice. Whole grains that are fortified with folic acid. This is recommended for females who are pregnant or who want  to become pregnant. Read labels and do not eat or drink the following: Foods or drinks with added sugars. These include foods that contain brown sugar, corn sweetener, corn syrup, dextrose, fructose, glucose, high-fructose corn syrup, honey, invert sugar, lactose, malt syrup, maltose, molasses, raw sugar, sucrose, trehalose, or turbinado sugar. Limit your intake of added sugars to less than 10% of your total daily calories. Do not eat more than the following amounts of added sugar per day: 6 teaspoons (25 g) for females. 9 teaspoons (38 g) for males. Foods that contain processed or refined starches and grains. Refined grain products, such as white flour, degermed cornmeal, white bread, and white rice. Shopping Choose nutrient-rich snacks, such as vegetables, whole fruits, and nuts. Avoid high-calorie and high-sugar snacks, such as potato chips, fruit snacks, and candy. Use oil-based dressings and spreads on foods instead of solid fats such as butter, margarine, sour cream, or cream cheese. Limit pre-made sauces, mixes, and "instant" products such as flavored rice, instant noodles, and ready-made pasta. Try more plant-protein sources, such as tofu, tempeh, black beans, edamame, lentils, nuts, and seeds. Explore eating plans such as the Mediterranean diet or vegetarian diet. Try heart-healthy dips made with beans and healthy fats like hummus and guacamole. Vegetables go great with these. Cooking Use oil to saut or stir-fry foods instead of solid fats such as butter, margarine, or lard. Try baking, boiling, grilling, or broiling instead of frying. Remove the fatty part of meats before cooking. Steam vegetables in water or broth. Meal planning  At meals, imagine dividing your plate into fourths: One-half of  your plate is fruits and vegetables. One-fourth of your plate is whole grains. One-fourth of your plate is protein, especially lean meats, poultry, eggs, tofu, beans, or nuts. Include  low-fat dairy as part of your daily diet. Lifestyle Choose healthy options in all settings, including home, work, school, restaurants, or stores. Prepare your food safely: Wash your hands after handling raw meats. Where you prepare food, keep surfaces clean by regularly washing with hot, soapy water. Keep raw meats separate from ready-to-eat foods, such as fruits and vegetables. Cook seafood, meat, poultry, and eggs to the recommended temperature. Get a food thermometer. Store foods at safe temperatures. In general: Keep cold foods at 76F (4.4C) or below. Keep hot foods at 176F (60C) or above. Keep your freezer at [facility] (-17.8C) or below. Foods are not safe to eat if they have been between the temperatures of 40-176F (4.4-60C) for more than 2 hours. What foods should I eat? Fruits Aim to eat 1-2 cups of fresh, canned (in natural juice), or frozen fruits each day. One cup of fruit equals 1 small apple, 1 large banana, 8 large strawberries, 1 cup (237 g) canned fruit,  cup (82 g) dried fruit, or 1 cup (240 mL) 100% juice. Vegetables Aim to eat 2-4 cups of fresh and frozen vegetables each day, including different varieties and colors. One cup of vegetables equals 1 cup (91 g) broccoli or cauliflower florets, 2 medium carrots, 2 cups (150 g) raw, leafy greens, 1 large tomato, 1 large bell pepper, 1 large sweet potato, or 1 medium white potato. Grains Aim to eat 5-10 ounce-equivalents of whole grains each day. Examples of 1 ounce-equivalent of grains include 1 slice of bread, 1 cup (40 g) ready-to-eat cereal, 3 cups (24 g) popcorn, or  cup (93 g) cooked rice. Meats and other proteins Try to eat 5-7 ounce-equivalents of protein each day. Examples of 1 ounce-equivalent of protein include 1 egg,  oz nuts (12 almonds, 24 pistachios, or 7 walnut halves), 1/4 cup (90 g) cooked beans, 6 tablespoons (90 g) hummus or 1 tablespoon (16 g) peanut butter. A cut of meat or fish that is the size of a deck  of cards is about 3-4 ounce-equivalents (85 g). Of the protein you eat each week, try to have at least 8 sounce (227 g) of seafood. This is about 2 servings per week. This includes salmon, trout, herring, sardines, and anchovies. Dairy Aim to eat 3 cup-equivalents of fat-free or low-fat dairy each day. Examples of 1 cup-equivalent of dairy include 1 cup (240 mL) milk, 8 ounces (250 g) yogurt, 1 ounces (44 g) natural cheese, or 1 cup (240 mL) fortified soy milk. Fats and oils Aim for about 5 teaspoons (21 g) of fats and oils per day. Choose monounsaturated fats, such as canola and olive oils, mayonnaise made with olive oil or avocado oil, avocados, peanut butter, and most nuts, or polyunsaturated fats, such as sunflower, corn, and soybean oils, walnuts, pine nuts, sesame seeds, sunflower seeds, and flaxseed. Beverages Aim for 6 eight-ounce glasses of water per day. Limit coffee to 3-5 eight-ounce cups per day. Limit caffeinated beverages that have added calories, such as soda and energy drinks. If you drink alcohol: Limit how much you have to: 0-1 drink a day if you are female. 0-2 drinks a day if you are female. Know how much alcohol is in your drink. In the [place] one drink is one 12 oz bottle of beer (355 mL), one 5 oz glass of wine (  148 mL), or one 1 oz glass of hard liquor (44 mL). Seasoning and other foods Try not to add too much salt to your food. Try using herbs and spices instead of salt. Try not to add sugar to food. This information is based on [place] nutrition guidelines. To learn more, visit [employer]posableN[employer].be. Exact amounts may vary. You may need different amounts. This information is not intended to replace advice given to you by your health care provider. Make sure you discuss any questions you have with your health care provider. Document Revised: 10/22/2021 Document Reviewed: 10/22/2021 Elsevier Patient Education  2024 [employer].

## 2023-07-04 ENCOUNTER — Ambulatory Visit: Payer: Self-pay | Admitting: Nurse Practitioner

## 2023-07-04 ENCOUNTER — Encounter: Payer: Self-pay | Admitting: Nurse Practitioner

## 2023-07-04 VITALS — BP 112/73 | HR 98 | Temp 98.0°F | Ht 62.0 in | Wt 233.2 lb

## 2023-07-04 DIAGNOSIS — G43009 Migraine without aura, not intractable, without status migrainosus: Secondary | ICD-10-CM

## 2023-07-04 DIAGNOSIS — E66812 Obesity, class 2: Secondary | ICD-10-CM

## 2023-07-04 DIAGNOSIS — G35 Multiple sclerosis: Secondary | ICD-10-CM | POA: Diagnosis not present

## 2023-07-04 DIAGNOSIS — F0632 Mood disorder due to known physiological condition with major depressive-like episode: Secondary | ICD-10-CM

## 2023-07-04 DIAGNOSIS — R41840 Attention and concentration deficit: Secondary | ICD-10-CM

## 2023-07-04 DIAGNOSIS — Z6841 Body Mass Index (BMI) 40.0 and over, adult: Secondary | ICD-10-CM

## 2023-07-04 DIAGNOSIS — E66813 Obesity, class 3: Secondary | ICD-10-CM

## 2023-07-04 DIAGNOSIS — F411 Generalized anxiety disorder: Secondary | ICD-10-CM | POA: Diagnosis not present

## 2023-07-04 DIAGNOSIS — E7800 Pure hypercholesterolemia, unspecified: Secondary | ICD-10-CM

## 2023-07-04 DIAGNOSIS — E6609 Other obesity due to excess calories: Secondary | ICD-10-CM

## 2023-07-04 DIAGNOSIS — F5105 Insomnia due to other mental disorder: Secondary | ICD-10-CM

## 2023-07-04 NOTE — Assessment & Plan Note (Signed)
 Chronic, stable.  Currently off Topamax.  Continue collaboration with neurology and recent notes reviewed.

## 2023-07-04 NOTE — Assessment & Plan Note (Signed)
 Chronic, improving.  Denies SI/HI.  Followed by psychiatry in past, currently PCP follows. Will continue Wellbutrin  XL 300 MG daily and maintain Zoloft  at current dose.  Continue Trazodone  for sleep and adjust as needed. Continue with therapist once finds a new provider.

## 2023-07-04 NOTE — Assessment & Plan Note (Addendum)
Chronic, ongoing secondary to MS.  Continue Trazodone and collaboration with neurology and therapy.   

## 2023-07-04 NOTE — Assessment & Plan Note (Signed)
 Refer to depression plan of care.

## 2023-07-04 NOTE — Progress Notes (Signed)
 BP 112/73   Pulse 98   Temp 98 F (36.7 C) (Oral)   Ht 5\' 2"  (1.575 m)   Wt 233 lb 3.2 oz (105.8 kg)   SpO2 97%   BMI 42.65 kg/m    Subjective:    Patient ID: [name], female    DOB: 06-Dec-1980, 43 y.o.   MRN: [id number]  HPI: [name] is a 43 y.o. female  Chief Complaint  Patient presents with   Depression   Insomnia   The 10-year ASCVD risk score (Arnett DK, et al., 2019) is: 0.5%   Values used to calculate the score:     Age: 64 years     Sex: Female     Is Non-Hispanic African American: No     Diabetic: No     Tobacco smoker: No     Systolic Blood Pressure: 112 mmHg     Is BP treated: No     HDL Cholesterol: 58 mg/dL     Total Cholesterol: 180 mg/dL  MIGRAINES & MS Follows with neurology for migraines and MS with last visit on 12/20/22.  Continues on infusions for MS, Ocrevus -- last at end of February.  Took Topamax  in past for migraines.  Has had more migraines recently with weather changes. Has migraine every 2 weeks. Neurology has recommended she try and stay hydrated.  However, has bladder leakage, wears pad every day. Working on pelvic floor and back exercises.   Treatments attempted: triptans and topamax    Aura: yes Nausea:  yes Vomiting: no Photophobia:  no Phonophobia:  no Effect on social functioning:  no Numbers of missed days of school/work each month: 0 Confusion:  no Gait disturbance/ataxia:  no Behavioral changes:  no Fevers:  no    DEPRESSION Last saw psychiatry 04/14/20, PCP has taken over regimen since that time. Continues on Sertraline , Wellbutrin , Adderall  (prescribed by neurology), and Trazodone  + Propranolol  as needed.  Did see therapy in past, but therapist took a full-time job and can only see her at 7 am. Mood status: stable Satisfied with current treatment?: yes Symptom severity: mild  Duration of current treatment : chronic Side effects: no Medication compliance: good  compliance Psychotherapy/counseling: yes current Depressed mood: yes Anxious mood: yes Anhedonia: no Significant weight loss or gain: no Insomnia: yes hard to stay asleep Fatigue:yes Feelings of worthlessness or guilt: no Impaired concentration/indecisiveness: no Suicidal ideations: no Hopelessness: no Crying spells: no    07/04/2023    9:01 AM 02/04/2023   10:13 AM 12/18/2022    9:10 AM 06/14/2022    8:29 AM 03/26/2022    4:20 PM  Depression screen PHQ 2/9  Decreased Interest 1 1 2 1  0  Down, Depressed, Hopeless 1 1 0 1 0  PHQ - 2 Score 2 2 2 2  0  Altered sleeping 3 3 3 3  0  Tired, decreased energy 3 2 3 3  0  Change in appetite 2 2 2 3  0  Feeling bad or failure about yourself  0 0 0 0 0  Trouble concentrating 3 1 2 1  0  Moving slowly or fidgety/restless 0 0 0 0 0  Suicidal thoughts 0 0 0 0 0  PHQ-9 Score 13 10 12 12  0  Difficult doing work/chores Very difficult Somewhat difficult Somewhat difficult Somewhat difficult       07/04/2023    9:02 AM 02/04/2023   10:13 AM 12/18/2022    9:10 AM 06/14/2022    8:30 AM  GAD 7 :  Generalized Anxiety Score  Nervous, Anxious, on Edge 1 1 1 2   Control/stop worrying 0 1 0 1  Worry too much - different things 0 1 1 1   Trouble relaxing 2 1 0 1  Restless 0 0 0 1  Easily annoyed or irritable 1 2 1 1   Afraid - awful might happen 0 1 0 0  Total GAD 7 Score 4 7 3 7   Anxiety Difficulty Somewhat difficult Somewhat difficult Not difficult at all Somewhat difficult   Relevant past medical, surgical, family and social history reviewed and updated as indicated. Interim medical history since our last visit reviewed. Allergies and medications reviewed and updated.  Review of Systems  Constitutional:  Negative for activity change, appetite change, diaphoresis, fatigue and fever.  Respiratory:  Negative for cough, chest tightness and shortness of breath.   Cardiovascular:  Negative for chest pain, palpitations and leg swelling.   Gastrointestinal: Negative.   Neurological: Negative.   Psychiatric/Behavioral: Negative.     Per HPI unless specifically indicated above     Objective:    BP 112/73   Pulse 98   Temp 98 F (36.7 C) (Oral)   Ht 5\' 2"  (1.575 m)   Wt 233 lb 3.2 oz (105.8 kg)   SpO2 97%   BMI 42.65 kg/m   Wt Readings from Last 3 Encounters:  07/04/23 233 lb 3.2 oz (105.8 kg)  04/29/23 225 lb (102.1 kg)  02/04/23 228 lb 6.4 oz (103.6 kg)    Physical Exam Vitals and nursing note reviewed.  Constitutional:      General: She is awake. She is not in acute distress.    Appearance: She is well-developed and well-groomed. She is obese. She is not ill-appearing or toxic-appearing.  HENT:     Head: Normocephalic.     Right Ear: Hearing and external ear normal.     Left Ear: Hearing and external ear normal.  Eyes:     General: Lids are normal.        Right eye: No discharge.        Left eye: No discharge.     Conjunctiva/sclera: Conjunctivae normal.     Pupils: Pupils are equal, round, and reactive to light.  Neck:     Thyroid : No thyromegaly.     Vascular: No carotid bruit.  Cardiovascular:     Rate and Rhythm: Normal rate and regular rhythm.     Heart sounds: Normal heart sounds. No murmur heard.    No gallop.  Pulmonary:     Effort: Pulmonary effort is normal. No accessory muscle usage or respiratory distress.     Breath sounds: Normal breath sounds.  Abdominal:     General: Bowel sounds are normal. There is no distension.     Palpations: Abdomen is soft.     Tenderness: There is no abdominal tenderness.  Musculoskeletal:     Cervical back: Normal range of motion and neck supple.     Right lower leg: No edema.     Left lower leg: No edema.  Lymphadenopathy:     Cervical: No cervical adenopathy.  Skin:    General: Skin is warm and dry.  Neurological:     Mental Status: She is alert and oriented to person, place, and time.     Deep Tendon Reflexes: Reflexes are normal and symmetric.      Reflex Scores:      Brachioradialis reflexes are 2+ on the right side and 2+ on the left side.  Patellar reflexes are 2+ on the right side and 2+ on the left side. Psychiatric:        Attention and Perception: Attention normal.        Mood and Affect: Mood normal.        Speech: Speech normal.        Behavior: Behavior normal. Behavior is cooperative.        Thought Content: Thought content normal.    Results for orders placed or performed in visit on 05/02/23  Beta hCG quant (ref lab)   Collection Time: 05/02/23  8:57 AM  Result Value Ref Range   hCG Quant 4 mIU/mL      Assessment & Plan:   Problem List Items Addressed This Visit       Cardiovascular and Mediastinum   Migraine without aura and without status migrainosus, not intractable   Chronic, stable.  Currently off Topamax .  Continue collaboration with neurology and recent notes reviewed.        Nervous and Auditory   Relapsing remitting multiple sclerosis (HCC) - Primary   Chronic, ongoing -- with infusions.  Continue to collaborate with neurology and current treatment. Recent notes reviewed.      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  Denies SI/HI.  Followed by psychiatry in past, currently PCP follows. Will continue Wellbutrin  XL 300 MG daily and maintain Zoloft  at current dose.  Continue Trazodone  for sleep and adjust as needed. Continue with therapist once finds a new provider.        Other   Obesity   BMI42.65. Referral to [facility] placed after discussion with patient. Recommended eating smaller high protein, low fat meals more frequently and exercising 30 mins a day 5 times a week with a goal of 10-15lb weight loss in the next 3 months. Patient voiced their understanding and motivation to adhere to these recommendations.       Relevant Orders   Amb Ref to [facility] Weight Management   Insomnia due to mental disorder    Chronic, ongoing secondary to MS.  Continue Trazodone  and collaboration with neurology and therapy.        GAD (generalized anxiety disorder)   Refer to depression plan of care.      Elevated low density lipoprotein (LDL) cholesterol level   Noted on past labs with ASCVD 0.5%, continue to monitor and diet focus with focus on modest weight loss.  Lipid panel and CMP today.      Attention deficit disorder   Chronic, ongoing, followed by neurology, continue current medication regimen and collaboration with them.        Follow up plan: Return in about 6 months (around 01/04/2024) for Annual Physical -- after 12/18/23.

## 2023-07-04 NOTE — Assessment & Plan Note (Signed)
 Chronic, ongoing -- with infusions.  Continue to collaborate with neurology and current treatment. Recent notes reviewed.

## 2023-07-04 NOTE — Assessment & Plan Note (Signed)
Chronic, ongoing, followed by neurology, continue current medication regimen and collaboration with them.

## 2023-07-04 NOTE — Assessment & Plan Note (Signed)
 Noted on past labs with ASCVD 0.5%, continue to monitor and diet focus with focus on modest weight loss.  Lipid panel and CMP today.

## 2023-07-04 NOTE — Assessment & Plan Note (Signed)
 ZOX09.60. Referral to [facility] placed after discussion with patient. Recommended eating smaller high protein, low fat meals more frequently and exercising 30 mins a day 5 times a week with a goal of 10-15lb weight loss in the next 3 months. Patient voiced their understanding and motivation to adhere to these recommendations.

## 2023-07-09 ENCOUNTER — Other Ambulatory Visit: Payer: Self-pay | Admitting: Nurse Practitioner

## 2023-07-09 ENCOUNTER — Other Ambulatory Visit: Payer: Self-pay | Admitting: Neurology

## 2023-07-09 NOTE — Telephone Encounter (Signed)
 Per note on 12/20/22 " Change trazodone  to imipramine  for insomnia and nocturia.  "  Rx denied, it also appears her PCP Clevester Dally, NP last filled on 06/14/23 with 12 refills

## 2023-07-10 NOTE — Telephone Encounter (Signed)
 Too soon for refill, refilled 06/14/23 for 30 and 12 rf.  Requested Prescriptions  Pending Prescriptions Disp Refills   buPROPion  (WELLBUTRIN  XL) 150 MG 24 hr tablet [Pharmacy Med Name: BUPROPION  HCL ER (XL) 150 MG TAB] 30 tablet     Sig: TAKE ONE TABLET BY MOUTH ONCE DAILY     Psychiatry: Antidepressants - bupropion  Passed - 07/10/2023 11:49 AM      Passed - Cr in normal range and within 360 days    Creatinine  Date Value Ref Range Status  07/28/2013 0.66 0.60 - 1.30 mg/dL Final   Creatinine, Ser  Date Value Ref Range Status  04/29/2023 0.76 0.44 - 1.00 mg/dL Final         Passed - AST in normal range and within 360 days    AST  Date Value Ref Range Status  04/29/2023 23 15 - 41 U/L Final   SGOT(AST)  Date Value Ref Range Status  07/25/2013 20 15 - 37 Unit/L Final         Passed - ALT in normal range and within 360 days    ALT  Date Value Ref Range Status  04/29/2023 30 0 - 44 U/L Final   SGPT (ALT)  Date Value Ref Range Status  07/25/2013 18 12 - 78 U/L Final         Passed - Last BP in normal range    BP Readings from Last 1 Encounters:  07/04/23 112/73         Passed - Valid encounter within last 6 months    Recent Outpatient Visits           6 days ago Relapsing remitting multiple sclerosis (HCC)   [facility] [place], Lavelle Posey, NP       Future Appointments             In 6 months Cannady, Jolene T, NP [place] [employer]

## 2023-07-21 NOTE — Patient Instructions (Incomplete)

## 2023-07-21 NOTE — Progress Notes (Deleted)
 No chief complaint on file.   HISTORY OF PRESENT ILLNESS:  07/21/23 ALL:  [name] is a 43 y.o. female here today for follow up for RRMS. She continues Ocrevus infusions every 6 months. Last infusion 04/03/2022, next 10/02/2022. Labs have been normal. MRI stable 09/2020.   She reports that she has done failry well. She admits that she has not taken care of herself very well over the past 6 months. She contributes this to finishing her clinical counseling. She is planning to start work in [place], soon. She feels that she is very tired. She feels that balance waxes and wanes. She is having right knee pain. She has chronic back pain. 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 She is not followed by spine specialist. Pain is stable, no worsening back pain, recently. No falls. She is not using her Rolator, regularly. She continues baclofen  10mg  in am and 30mg  at bedtime.   Headaches have improved now that she is back on topiramate  (PCP). She is trying to work on increasing water intake. Mouth stays dry.  Adderall  ER 30mg  continues to help some with fatigue. She notices a big difference in energy level if she forgets to take it. She feels mood is good. She continues sertraline  200mg  and bupropion  150mg  (PCP). She feels that she sleeps well taking trazodone  100mg  at bedtime. She reports being off bupropion  for about 2 months, recently, and felt that she was more emotional. Cried more. All three antidepressants started with psychiatry years ago. She feels that she is tolerating meds well.   She reports having to wear a sanitary pad for urinary leaking. She is doing Kegel exercises. She was previously on oxybutynin   and could not tolerate side effects.   She is seeing no longer seeing healthy weight and wellness. Wegovy  no longer covered.   She continues vitamin D  5000iu daily.    HISTORY (copied from Dr Thom Fleeting previous note)  [name] is a 43  y.o. woman with relapsing remitting multiple sclerosis.      Update 12/20/2022: She had her last Ocrevus end of August 24 and next one scheduled in Feb 25.  She needs extra Benadryl  with most infusions but is fine the next day.    She has no definite exacerbations.   MRI of the brain 52024showed  a focus within the left middle cerebellar peduncle and a couple small foci in the periventricular white matter.  None of these enhance or appear to be acute.  Compared to the MRI from 2022, there were no new lesions.     She has reduced balance.   She has some some right leg weakness and spasticity.   She stumbles some and had one fall trippig on her dragging .  She has a right foot drop. She has never tried an AFO.  She also takes baclofen  (10 mg in am and 30 mg at night)  for spasticity with benefit.       Mood is doing well on Zoloft  200 mg and Wellbutrin  300 mg/d (just increased). .  She also sees [facility].  She notes mild cognitive issues - especially more difficulty with verbal processing.   She  has some word finding errors and word substitution.     She plays piano and guitar and has some errors when she reads music.  She changed job to a Theatre manager.     Adderall  XR in the morning with additional IR many but  not all afternoons.     She has insomnia helped by trazodone  but she has not taken recently --- she notes insomnia is better if she takes trazodone .  PSG showed snoring but not significant OSA.  She had sparse N3 (4.9%)   Migraine headaches have been well on topiramate .   She had Covid last year and again last month. The second time was mild while the first time wiped her out x weeks.  She also has sinus issues and will be seeing ENT.     MS history:   In late June or early July 2011, she had the onset of numbness that went up to her belly button. She presented to the emergency room. An MRI of the brain showed 1 periventricular focus with maybe a second subtle  periventricular focus. An MRI of the cervical spine showed an enhancing lesion adjacent to C3-C4. The diagnosis of possible MS was made but the evidence was not enough to begin a disease modifying therapy. In 2015, she had the onset of similar symptoms and also had decreased ability to use the left hand. An MRI of the brain at that time showed several new lesions not present in 2011, including a focus in the right middle cerebellar peduncle additionally, she had a lumbar puncture consistent with MS. Because of her symptoms, MRI changes and lumbar puncture results, she was diagnosed with clinical definite relapsing remitting MS and was started on Tecfidera. She has continued on Tecfidera. Over the past few months she has noted more difficulty with focus and concentration.     She also has had some change in vision more recently. Because of these newer symptoms, and MRI was repeated 04/27/2015. The MRI of the cervical spine showed no new lesions. though the MRI of the brain showed 2  lesions not present in 2015, one in the left juxtacortical white matter and one in the left deep white matter.  She had a small exacerbation in August 2017 and received IV SOlu-Medrol .   MRI cervical spine 2017 an MRI of the brain show two spinal plaques, a definite rightt middle cerebellar peduncle plaque and a possible right pontine plaque and several foci in the hemispheres  MRI 01/2016 showed no new lesions.  She is JCV Ab positive.  She had an exacerbation in 2017 and early 2018 switch to ocrelizumab with her first doses in May 2018 (was on Gilenya ).   She had another small exacerbation in July (right leg stiff/weak) and she received 3 days of IV site.  She went from using a cane to a walker for balance.  She tolerates the ocrelizumab well.      FH:  Her mom has MS and her MS stabilized after a bone marrow transplant for cancer.   MRI images MRI of the brain 09/24/2017 showed T2 hyperintense foci in the right middle cerebellar  peduncle and hemispheres.  There were no changes compared to 09/08/2017.   MRI of the cervical spine 09/26/2017 showed foci within the spinal cord adjacent to C4 and C6-C7.  They were both present on an MRI from 04/04/2016.  There is mild spinal stenosis at C5-C6 due to disc protrusion.   MRI of the brain 09/06/2020 showed  a focus within the left middle cerebellar peduncle and a couple small foci in the periventricular white matter.  None of these enhance or appear to be acute.  Compared to the MRI dated 09/24/2017, there are no new lesions.  These are consistent with chronic demyelinating  plaque associated with multiple sclerosis  REVIEW OF SYSTEMS: Out of a complete 14 system review of symptoms, the patient complains only of the following symptoms, headaches, fatigue, insomnia, right knee pain, imbalance and all other reviewed systems are negative.   ALLERGIES: Allergies  Allergen Reactions   Flexeril  [Cyclobenzaprine ] Other (See Comments)    Drowsy when taking during the day      HOME MEDICATIONS: Outpatient Medications Prior to Visit  Medication Sig Dispense Refill   albuterol  (VENTOLIN  HFA) 108 (90 Base) MCG/ACT inhaler TAKE 2 PUFFS BY MOUTH EVERY 6 HOURS AS NEEDED FOR WHEEZE OR SHORTNESS OF BREATH 18 each 0   amphetamine -dextroamphetamine  (ADDERALL  XR) 30 MG 24 hr capsule Take 1 capsule (30 mg total) by mouth daily. 30 capsule 0   baclofen  (LIORESAL ) 10 MG tablet TAKE 1 TABLET BY MOUTH FOUR TIMES DAILY AS NEEDED FOR MUSCLE SPASMS 360 each 2   buPROPion  (WELLBUTRIN  XL) 300 MG 24 hr tablet Take 1 tablet (300 mg total) by mouth daily. 30 tablet 12   Cholecalciferol 25 MCG (1000 UT) tablet Take 5,000 Units by mouth daily.      meclizine  (ANTIVERT ) 25 MG tablet Take 1 tablet (25 mg total) by mouth 3 (three) times daily as needed for dizziness. 30 tablet 0   ocrelizumab 600 mg in sodium chloride  0.9 % 500 mL Inject 600 mg into the vein every 6 (six) months.     propranolol  (INDERAL ) 10 MG tablet  Take 1 tablet (10 mg total) by mouth daily as needed. For severe anxiety attack 30 tablet 12   sertraline  (ZOLOFT ) 100 MG tablet Take 2 tablets (200 mg total) by mouth daily. 60 tablet 12   traZODone  (DESYREL ) 100 MG tablet Take 1 tablet (100 mg total) by mouth at bedtime. 30 tablet 12   No facility-administered medications prior to visit.     PAST MEDICAL HISTORY: Past Medical History:  Diagnosis Date   Depression    Migraine    MS (multiple sclerosis) (HCC)    Multiple sclerosis (HCC)    Spinal stenosis      PAST SURGICAL HISTORY: Past Surgical History:  Procedure Laterality Date   NO PAST SURGERIES       FAMILY HISTORY: Family History  Problem Relation Age of Onset   Multiple myeloma Mother    Multiple sclerosis Mother    Obesity Sister    Anxiety disorder Daughter    Multiple sclerosis Maternal Aunt      SOCIAL HISTORY: Social History   Socioeconomic History   Marital status: Married    Spouse name: Corporate treasurer   Number of children: 3   Years of education: Not on file   Highest education level: Master's degree (e.g., MA, MS, MEng, MEd, MSW, MBA)  Occupational History   Not on file  Tobacco Use   Smoking status: Never   Smokeless tobacco: Never  Vaping Use   Vaping status: Never Used  Substance and Sexual Activity   Alcohol use: Not Currently    Alcohol/week: 0.0 standard drinks of alcohol    Comment: occasional/fim   Drug use: No   Sexual activity: Yes    Birth control/protection: I.U.D.  Other Topics Concern   Not on file  Social History Narrative   Not on file   Social Drivers of Health   Financial Resource Strain: High Risk (02/04/2023)   Overall Financial Resource Strain (CARDIA)    Difficulty of Paying Living Expenses: Hard  Food Insecurity: Food Insecurity Present (02/04/2023)  Hunger Vital Sign    Worried About Running Out of Food in the Last Year: Sometimes true    Ran Out of Food in the Last Year: Sometimes true   Transportation Needs: No Transportation Needs (02/04/2023)   PRAPARE - Administrator, Civil Service (Medical): No    Lack of Transportation (Non-Medical): No  Physical Activity: Inactive (02/04/2023)   Exercise Vital Sign    Days of Exercise per Week: 0 days    Minutes of Exercise per Session: 0 min  Stress: No Stress Concern Present (02/04/2023)   [employer] of Occupational Health - Occupational Stress Questionnaire    Feeling of Stress : Only a little  Social Connections: Socially Integrated (02/04/2023)   Social Connection and Isolation Panel    Frequency of Communication with Friends and Family: More than three times a week    Frequency of Social Gatherings with Friends and Family: Twice a week    Attends Religious Services: More than 4 times per year    Active Member of [employer] or Organizations: Yes    Attends Engineer, structural: More than 4 times per year    Marital Status: Married  Catering manager Violence: Not At Risk (12/18/2022)   Humiliation, Afraid, Rape, and Kick questionnaire    Fear of Current or Ex-Partner: No    Emotionally Abused: No    Physically Abused: No    Sexually Abused: No     PHYSICAL EXAM  There were no vitals filed for this visit.   There is no height or weight on file to calculate BMI.  Generalized: Well developed, in no acute distress  Cardiology: normal rate and rhythm, no murmur auscultated  Respiratory: clear to auscultation bilaterally    Neurological examination  Mentation: Alert oriented to time, place, history taking. Follows all commands speech and language fluent Cranial nerve II-XII: Pupils were equal round reactive to light. Extraocular movements were full, visual field were full on confrontational test. Facial sensation and strength were normal. Head turning and shoulder shrug  were normal and symmetric. Motor: The motor testing reveals 5 over 5 strength of all 4 extremities. Good symmetric motor tone  is noted throughout.  Sensory: Sensory testing is intact to soft touch on all 4 extremities. No evidence of extinction is noted.  Coordination: Cerebellar testing reveals good finger-nose-finger and heel-to-shin bilaterally.  Gait and station: Gait is normal. Tandem gait is slightly unstable but able to complete without assistive device.   Reflexes: Deep tendon reflexes are symmetric and normal bilaterally.    DIAGNOSTIC DATA (LABS, IMAGING, TESTING) - I reviewed patient records, labs, notes, testing and imaging myself where available.  Lab Results  Component Value Date   WBC 11.9 (H) 04/29/2023   HGB 13.2 04/29/2023   HCT 40.7 04/29/2023   MCV 78.1 (L) 04/29/2023   PLT 407 (H) 04/29/2023      Component Value Date/Time   NA 139 04/29/2023 1110   NA 142 12/18/2022 1028   NA 141 07/28/2013 0410   K 3.8 04/29/2023 1110   K 3.7 07/28/2013 0410   CL 105 04/29/2023 1110   CL 103 07/28/2013 0410   CO2 24 04/29/2023 1110   CO2 29 07/28/2013 0410   GLUCOSE 115 (H) 04/29/2023 1110   GLUCOSE 121 (H) 07/28/2013 0410   BUN 12 04/29/2023 1110   BUN 9 12/18/2022 1028   BUN 16 07/28/2013 0410   CREATININE 0.76 04/29/2023 1110   CREATININE 0.66 07/28/2013 0410   CALCIUM  9.0 04/29/2023 1110   CALCIUM 8.6 07/28/2013 0410   PROT 6.8 04/29/2023 1110   PROT 6.9 12/18/2022 1028   PROT 6.7 07/25/2013 0410   ALBUMIN 3.6 04/29/2023 1110   ALBUMIN 4.6 12/18/2022 1028   ALBUMIN 3.3 (L) 07/25/2013 0410   AST 23 04/29/2023 1110   AST 20 07/25/2013 0410   ALT 30 04/29/2023 1110   ALT 18 07/25/2013 0410   ALKPHOS 90 04/29/2023 1110   ALKPHOS 49 07/25/2013 0410   BILITOT 0.5 04/29/2023 1110   BILITOT 0.3 12/18/2022 1028   BILITOT 0.4 07/25/2013 0410   GFRNONAA >60 04/29/2023 1110   GFRNONAA >60 07/28/2013 0410   GFRAA 108 12/06/2019 0955   GFRAA >60 07/28/2013 0410   Lab Results  Component Value Date   CHOL 180 12/18/2022   HDL 58 12/18/2022   LDLCALC 102 (H) 12/18/2022   TRIG 111  12/18/2022   CHOLHDL 2.6 12/02/2017   Lab Results  Component Value Date   HGBA1C 5.6 06/06/2021   No results found for: [id number] Lab Results  Component Value Date   TSH 1.080 12/18/2022        No data to display               No data to display           ASSESSMENT AND PLAN  43 y.o. year old female  has a past medical history of Depression, Migraine, MS (multiple sclerosis) (HCC), Multiple sclerosis (HCC), and Spinal stenosis. here with    No diagnosis found.  [name] returns for follow up. MS symptoms wax and wane. No new or exacerbating symptoms. I will update labs. She will continue Ocrevus infusions. She will continue baclofen  10mg  PRN as well as Adderall  XR 30mg  and trazodone  100mg  daily. She is on three antidepressants and feels mood is well managed. She is sleeping well. S/S of serotonin syndrome reviewed and she will monitor closely. Healthy lifestyle habits encouraged. She will follow up with PCP as directed. She will return to see Dr Godwin Lat in 6 months, sooner if needed. She verbalizes understanding and agreement with this plan.   No orders of the defined types were placed in this encounter.    No orders of the defined types were placed in this encounter.    Terrilyn Fick, MSN, FNP-C 07/21/2023, 4:21 PM  [facility] [address], [address] [place], [place] [address] [phone]

## 2023-07-22 ENCOUNTER — Ambulatory Visit: Admitting: Family Medicine

## 2023-07-22 ENCOUNTER — Ambulatory Visit: Payer: BC Managed Care – PPO | Admitting: Family Medicine

## 2023-07-22 ENCOUNTER — Encounter: Payer: Self-pay | Admitting: Family Medicine

## 2023-07-22 NOTE — Patient Instructions (Signed)
 Below is our plan:  We will continue current treatment plan. Work on Location manager intake and activity. Let me know if you need me.   Please make sure you are staying well hydrated. I recommend 50-60 ounces daily. Well balanced diet and regular exercise encouraged. Consistent sleep schedule with 6-8 hours recommended.   Please continue follow up with care team as directed.   Follow up with Dr Godwin Lat in 6 months   You may receive a survey regarding today's visit. I encourage you to leave honest feed back as I do use this information to improve patient care. Thank you for seeing me today!

## 2023-07-22 NOTE — Progress Notes (Signed)
 Chief Complaint  Patient presents with   Follow-up    Pt in room 1. Alone. Here for MS follow up. DMT: Ocrevus, Next infusion date: 09/30/2023. Pt reports doing well, reports brain fog is more noticeable. Pt has a referral to weight management no appointment scheduled.     HISTORY OF PRESENT ILLNESS:  07/23/23 ALL:  [name] is a 43 y.o. female here today for follow up for RRMS. She continues Ocrevus infusions every 6 months. Last infusion 03/2023, next 09/2023. Labs have been normal. MRI 06/2022 stable from 09/2020.   She reports that she has done failry well. She finished degree in clinical counseling. She is working for [employer]. She works with Public affairs consultant. She is seeking post Child psychotherapist degree. She feels that balance waxes and wanes. She has chronic back pain. 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 She is not followed by spine specialist. Pain is stable, no worsening back pain, recently. No falls. She is not using her Rolator, regularly. She continues baclofen  10mg  in am and 30mg  at bedtime.   Headaches are stable. She is trying to work on increasing water intake. Mouth stays dry.  She continues to have chronic fatigue. Adderall  ER 30mg  continues to help some with fatigue. She notices a big difference in energy level if she forgets to take it. She feels mood is good. She continues sertraline  200mg  and bupropion  300mg  (PCP). She feels that she sleeps well taking trazodone  100mg  at bedtime. Mood medications managed by PCP but started with psychiatry many years ago. She feels that she is tolerating meds well. She is considering weaning this meds.   She reports having to wear a sanitary pad for urinary leaking. She is doing Kegel exercises. She was previously on oxybutynin   and could not tolerate side effects.   She is being referred to healthy weight and wellness. She is trying to increase activity. She takes steps at work when she can. [employer]  no longer  covered.   She continues vitamin D  5000iu daily. Level normal 12/2022.    HISTORY (copied from Dr Thom Fleeting previous note)  [name] is a 43 y.o. woman with relapsing remitting multiple sclerosis.      Update 12/20/2022: She had her last Ocrevus end of August 24 and next one scheduled in Feb 25.  She needs extra Benadryl  with most infusions but is fine the next day.    She has no definite exacerbations.   MRI of the brain 52024showed  a focus within the left middle cerebellar peduncle and a couple small foci in the periventricular white matter.  None of these enhance or appear to be acute.  Compared to the MRI from 2022, there were no new lesions.     She has reduced balance.   She has some some right leg weakness and spasticity.   She stumbles some and had one fall trippig on her dragging .  She has a right foot drop. She has never tried an AFO.  She also takes baclofen  (10 mg in am and 30 mg at night)  for spasticity with benefit.       Mood is doing well on Zoloft  200 mg and Wellbutrin  300 mg/d (just increased). .  She also sees [facility].  She notes mild cognitive issues - especially more difficulty with verbal processing.   She  has some word finding errors and word substitution.     She plays piano and guitar and has some errors  when she reads music.  She changed job to a Theatre manager.     Adderall  XR in the morning with additional IR many but not all afternoons.     She has insomnia helped by trazodone  but she has not taken recently --- she notes insomnia is better if she takes trazodone .  PSG showed snoring but not significant OSA.  She had sparse N3 (4.9%)   Migraine headaches have been well on topiramate .   She had Covid last year and again last month. The second time was mild while the first time wiped her out x weeks.  She also has sinus issues and will be seeing ENT.     MS history:   In late June or early July 2011, she had the onset of numbness  that went up to her belly button. She presented to the emergency room. An MRI of the brain showed 1 periventricular focus with maybe a second subtle periventricular focus. An MRI of the cervical spine showed an enhancing lesion adjacent to C3-C4. The diagnosis of possible MS was made but the evidence was not enough to begin a disease modifying therapy. In 2015, she had the onset of similar symptoms and also had decreased ability to use the left hand. An MRI of the brain at that time showed several new lesions not present in 2011, including a focus in the right middle cerebellar peduncle additionally, she had a lumbar puncture consistent with MS. Because of her symptoms, MRI changes and lumbar puncture results, she was diagnosed with clinical definite relapsing remitting MS and was started on Tecfidera. She has continued on Tecfidera. Over the past few months she has noted more difficulty with focus and concentration.     She also has had some change in vision more recently. Because of these newer symptoms, and MRI was repeated 04/27/2015. The MRI of the cervical spine showed no new lesions. though the MRI of the brain showed 2  lesions not present in 2015, one in the left juxtacortical white matter and one in the left deep white matter.  She had a small exacerbation in August 2017 and received IV SOlu-Medrol .   MRI cervical spine 2017 an MRI of the brain show two spinal plaques, a definite rightt middle cerebellar peduncle plaque and a possible right pontine plaque and several foci in the hemispheres  MRI 01/2016 showed no new lesions.  She is JCV Ab positive.  She had an exacerbation in 2017 and early 2018 switch to ocrelizumab with her first doses in May 2018 (was on Gilenya ).   She had another small exacerbation in July (right leg stiff/weak) and she received 3 days of IV site.  She went from using a cane to a walker for balance.  She tolerates the ocrelizumab well.      FH:  Her mom has MS and her MS  stabilized after a bone marrow transplant for cancer.   MRI images MRI of the brain 09/24/2017 showed T2 hyperintense foci in the right middle cerebellar peduncle and hemispheres.  There were no changes compared to 09/08/2017.   MRI of the cervical spine 09/26/2017 showed foci within the spinal cord adjacent to C4 and C6-C7.  They were both present on an MRI from 04/04/2016.  There is mild spinal stenosis at C5-C6 due to disc protrusion.   MRI of the brain 09/06/2020 showed  a focus within the left middle cerebellar peduncle and a couple small foci in the periventricular white matter.  None  of these enhance or appear to be acute.  Compared to the MRI dated 09/24/2017, there are no new lesions.  These are consistent with chronic demyelinating plaque associated with multiple sclerosis  REVIEW OF SYSTEMS: Out of a complete 14 system review of symptoms, the patient complains only of the following symptoms, headaches, fatigue, insomnia, right knee pain, imbalance and all other reviewed systems are negative.   ALLERGIES: Allergies  Allergen Reactions   Flexeril  [Cyclobenzaprine ] Other (See Comments)    Drowsy when taking during the day      HOME MEDICATIONS: Outpatient Medications Prior to Visit  Medication Sig Dispense Refill   albuterol  (VENTOLIN  HFA) 108 (90 Base) MCG/ACT inhaler TAKE 2 PUFFS BY MOUTH EVERY 6 HOURS AS NEEDED FOR WHEEZE OR SHORTNESS OF BREATH 18 each 0   amphetamine -dextroamphetamine  (ADDERALL  XR) 30 MG 24 hr capsule Take 1 capsule (30 mg total) by mouth daily. 30 capsule 0   baclofen  (LIORESAL ) 10 MG tablet TAKE 1 TABLET BY MOUTH FOUR TIMES DAILY AS NEEDED FOR MUSCLE SPASMS 360 each 2   buPROPion  (WELLBUTRIN  XL) 300 MG 24 hr tablet Take 1 tablet (300 mg total) by mouth daily. 30 tablet 12   Cholecalciferol 25 MCG (1000 UT) tablet Take 5,000 Units by mouth daily.      meclizine  (ANTIVERT ) 25 MG tablet Take 1 tablet (25 mg total) by mouth 3 (three) times daily as needed for dizziness.  30 tablet 0   ocrelizumab 600 mg in sodium chloride  0.9 % 500 mL Inject 600 mg into the vein every 6 (six) months.     propranolol  (INDERAL ) 10 MG tablet Take 1 tablet (10 mg total) by mouth daily as needed. For severe anxiety attack 30 tablet 12   sertraline  (ZOLOFT ) 100 MG tablet Take 2 tablets (200 mg total) by mouth daily. 60 tablet 12   traZODone  (DESYREL ) 100 MG tablet Take 1 tablet (100 mg total) by mouth at bedtime. 30 tablet 12   No facility-administered medications prior to visit.     PAST MEDICAL HISTORY: Past Medical History:  Diagnosis Date   Depression    Migraine    MS (multiple sclerosis) (HCC)    Multiple sclerosis (HCC)    Spinal stenosis      PAST SURGICAL HISTORY: Past Surgical History:  Procedure Laterality Date   NO PAST SURGERIES       FAMILY HISTORY: Family History  Problem Relation Age of Onset   Multiple myeloma Mother    Multiple sclerosis Mother    Obesity Sister    Anxiety disorder Daughter    Multiple sclerosis Maternal Aunt      SOCIAL HISTORY: Social History   Socioeconomic History   Marital status: Married    Spouse name: Corporate treasurer   Number of children: 3   Years of education: Not on file   Highest education level: Master's degree (e.g., MA, MS, MEng, MEd, MSW, MBA)  Occupational History   Not on file  Tobacco Use   Smoking status: Never   Smokeless tobacco: Never  Vaping Use   Vaping status: Never Used  Substance and Sexual Activity   Alcohol use: Not Currently    Alcohol/week: 0.0 standard drinks of alcohol    Comment: occasional/fim   Drug use: No   Sexual activity: Yes    Birth control/protection: I.U.D.  Other Topics Concern   Not on file  Social History Narrative   Not on file   Social Drivers of Health   Financial Resource Strain: High  Risk (02/04/2023)   Overall Financial Resource Strain (CARDIA)    Difficulty of Paying Living Expenses: Hard  Food Insecurity: Food Insecurity Present (02/04/2023)    Hunger Vital Sign    Worried About Running Out of Food in the Last Year: Sometimes true    Ran Out of Food in the Last Year: Sometimes true  Transportation Needs: No Transportation Needs (02/04/2023)   PRAPARE - Administrator, Civil Service (Medical): No    Lack of Transportation (Non-Medical): No  Physical Activity: Inactive (02/04/2023)   Exercise Vital Sign    Days of Exercise per Week: 0 days    Minutes of Exercise per Session: 0 min  Stress: No Stress Concern Present (02/04/2023)   [employer] of Occupational Health - Occupational Stress Questionnaire    Feeling of Stress : Only a little  Social Connections: Socially Integrated (02/04/2023)   Social Connection and Isolation Panel    Frequency of Communication with Friends and Family: More than three times a week    Frequency of Social Gatherings with Friends and Family: Twice a week    Attends Religious Services: More than 4 times per year    Active Member of [employer] or Organizations: Yes    Attends Banker Meetings: More than 4 times per year    Marital Status: Married  Catering manager Violence: Not At Risk (12/18/2022)   Humiliation, Afraid, Rape, and Kick questionnaire    Fear of Current or Ex-Partner: No    Emotionally Abused: No    Physically Abused: No    Sexually Abused: No     PHYSICAL EXAM  Vitals:   07/23/23 0804  BP: 120/83  Pulse: 88  SpO2: 96%  Weight: 237 lb (107.5 kg)  Height: 5' 2.5 (1.588 m)     Body mass index is 42.66 kg/m.  Generalized: Well developed, in no acute distress  Cardiology: normal rate and rhythm, no murmur auscultated  Respiratory: clear to auscultation bilaterally    Neurological examination  Mentation: Alert oriented to time, place, history taking. Follows all commands speech and language fluent Cranial nerve II-XII: Pupils were equal round reactive to light. Extraocular movements were full, visual field were full on confrontational test.  Facial sensation and strength were normal. Head turning and shoulder shrug  were normal and symmetric. Motor: The motor testing reveals 5 over 5 strength of all 4 extremities. Good symmetric motor tone is noted throughout.  Sensory: Sensory testing is intact to soft touch on all 4 extremities. No evidence of extinction is noted.  Coordination: Cerebellar testing reveals good finger-nose-finger and heel-to-shin bilaterally.  Gait and station: Gait is normal. Tandem gait is slightly unstable but able to complete without assistive device.   Reflexes: Deep tendon reflexes are symmetric and normal bilaterally.    DIAGNOSTIC DATA (LABS, IMAGING, TESTING) - I reviewed patient records, labs, notes, testing and imaging myself where available.  Lab Results  Component Value Date   WBC 11.9 (H) 04/29/2023   HGB 13.2 04/29/2023   HCT 40.7 04/29/2023   MCV 78.1 (L) 04/29/2023   PLT 407 (H) 04/29/2023      Component Value Date/Time   NA 139 04/29/2023 1110   NA 142 12/18/2022 1028   NA 141 07/28/2013 0410   K 3.8 04/29/2023 1110   K 3.7 07/28/2013 0410   CL 105 04/29/2023 1110   CL 103 07/28/2013 0410   CO2 24 04/29/2023 1110   CO2 29 07/28/2013 0410   GLUCOSE 115 (H)  04/29/2023 1110   GLUCOSE 121 (H) 07/28/2013 0410   BUN 12 04/29/2023 1110   BUN 9 12/18/2022 1028   BUN 16 07/28/2013 0410   CREATININE 0.76 04/29/2023 1110   CREATININE 0.66 07/28/2013 0410   CALCIUM 9.0 04/29/2023 1110   CALCIUM 8.6 07/28/2013 0410   PROT 6.8 04/29/2023 1110   PROT 6.9 12/18/2022 1028   PROT 6.7 07/25/2013 0410   ALBUMIN 3.6 04/29/2023 1110   ALBUMIN 4.6 12/18/2022 1028   ALBUMIN 3.3 (L) 07/25/2013 0410   AST 23 04/29/2023 1110   AST 20 07/25/2013 0410   ALT 30 04/29/2023 1110   ALT 18 07/25/2013 0410   ALKPHOS 90 04/29/2023 1110   ALKPHOS 49 07/25/2013 0410   BILITOT 0.5 04/29/2023 1110   BILITOT 0.3 12/18/2022 1028   BILITOT 0.4 07/25/2013 0410   GFRNONAA >60 04/29/2023 1110   GFRNONAA >60  07/28/2013 0410   GFRAA 108 12/06/2019 0955   GFRAA >60 07/28/2013 0410   Lab Results  Component Value Date   CHOL 180 12/18/2022   HDL 58 12/18/2022   LDLCALC 102 (H) 12/18/2022   TRIG 111 12/18/2022   CHOLHDL 2.6 12/02/2017   Lab Results  Component Value Date   HGBA1C 5.6 06/06/2021   No results found for: [id number] Lab Results  Component Value Date   TSH 1.080 12/18/2022        No data to display               No data to display           ASSESSMENT AND PLAN  43 y.o. year old female  has a past medical history of Depression, Migraine, MS (multiple sclerosis) (HCC), Multiple sclerosis (HCC), and Spinal stenosis. here with    Relapsing remitting multiple sclerosis (HCC) - Plan: CBC with Differential/Platelets, IgG, IgA, IgM  High risk medication use  Gait disturbance  Migraine without aura and without status migrainosus, not intractable  Depression with anxiety  Insomnia due to mental disorder  Vitamin D  deficiency   Shandee C Cartner-Ambrose returns for follow up. MS symptoms wax and wane. No new or exacerbating symptoms. I will update labs, today. She will continue Ocrevus infusions. She will continue baclofen  10mg  PRN as well as Adderall  XR 30mg  daily. PDMP appropriate and shows last refilled 07/09/2023. She is on three antidepressants and feels mood is well managed. She is sleeping well. Healthy lifestyle habits encouraged. She will follow up with PCP as directed. She will return to see Dr Godwin Lat in 6 months, sooner if needed. She verbalizes understanding and agreement with this plan.    Orders Placed This Encounter  Procedures   CBC with Differential/Platelets   IgG, IgA, IgM     No orders of the defined types were placed in this encounter.    Terrilyn Fick, MSN, FNP-C 07/23/2023, 9:15 AM  [facility] [address], [address] [place], [place] [address] [phone]

## 2023-07-23 ENCOUNTER — Encounter: Payer: Self-pay | Admitting: Family Medicine

## 2023-07-23 ENCOUNTER — Ambulatory Visit: Admitting: Family Medicine

## 2023-07-23 VITALS — BP 120/83 | HR 88 | Ht 62.5 in | Wt 237.0 lb

## 2023-07-23 DIAGNOSIS — E559 Vitamin D deficiency, unspecified: Secondary | ICD-10-CM

## 2023-07-23 DIAGNOSIS — Z79899 Other long term (current) drug therapy: Secondary | ICD-10-CM | POA: Diagnosis not present

## 2023-07-23 DIAGNOSIS — G43009 Migraine without aura, not intractable, without status migrainosus: Secondary | ICD-10-CM | POA: Diagnosis not present

## 2023-07-23 DIAGNOSIS — G35 Multiple sclerosis: Secondary | ICD-10-CM

## 2023-07-23 DIAGNOSIS — F5105 Insomnia due to other mental disorder: Secondary | ICD-10-CM

## 2023-07-23 DIAGNOSIS — R269 Unspecified abnormalities of gait and mobility: Secondary | ICD-10-CM | POA: Diagnosis not present

## 2023-07-23 DIAGNOSIS — F418 Other specified anxiety disorders: Secondary | ICD-10-CM

## 2023-07-24 LAB — CBC WITH DIFFERENTIAL/PLATELET
Basophils Absolute: 0.1 10*3/uL (ref 0.0–0.2)
Basos: 1 %
EOS (ABSOLUTE): 0.3 10*3/uL (ref 0.0–0.4)
Eos: 4 %
Hematocrit: 40.4 % (ref 34.0–46.6)
Hemoglobin: 12.6 g/dL (ref 11.1–15.9)
Immature Grans (Abs): 0 10*3/uL (ref 0.0–0.1)
Immature Granulocytes: 0 %
Lymphocytes Absolute: 1.5 10*3/uL (ref 0.7–3.1)
Lymphs: 22 %
MCH: 25.2 pg — ABNORMAL LOW (ref 26.6–33.0)
MCHC: 31.2 g/dL — ABNORMAL LOW (ref 31.5–35.7)
MCV: 81 fL (ref 79–97)
Monocytes Absolute: 0.5 10*3/uL (ref 0.1–0.9)
Monocytes: 7 %
Neutrophils Absolute: 4.7 10*3/uL (ref 1.4–7.0)
Neutrophils: 66 %
Platelets: 400 10*3/uL (ref 150–450)
RBC: 5 x10E6/uL (ref 3.77–5.28)
RDW: 14.7 % (ref 11.7–15.4)
WBC: 7.1 10*3/uL (ref 3.4–10.8)

## 2023-07-24 LAB — IGG, IGA, IGM
IgA/Immunoglobulin A, Serum: 70 mg/dL — ABNORMAL LOW (ref 87–352)
IgG (Immunoglobin G), Serum: 718 mg/dL (ref 586–1602)
IgM (Immunoglobulin M), Srm: 58 mg/dL (ref 26–217)

## 2023-07-25 IMAGING — CR DG CHEST 2V
1 series · 2 of 2 positions shown · non-contrast
Comparison: Chest x-ray dated January 23, 2018

CLINICAL DATA: Chest pain

EXAM:
CHEST - 2 VIEW

[Series 1: dg chest 2 view · 0.14mm/px · 2 of 2 slices shown]
[im 1/2]
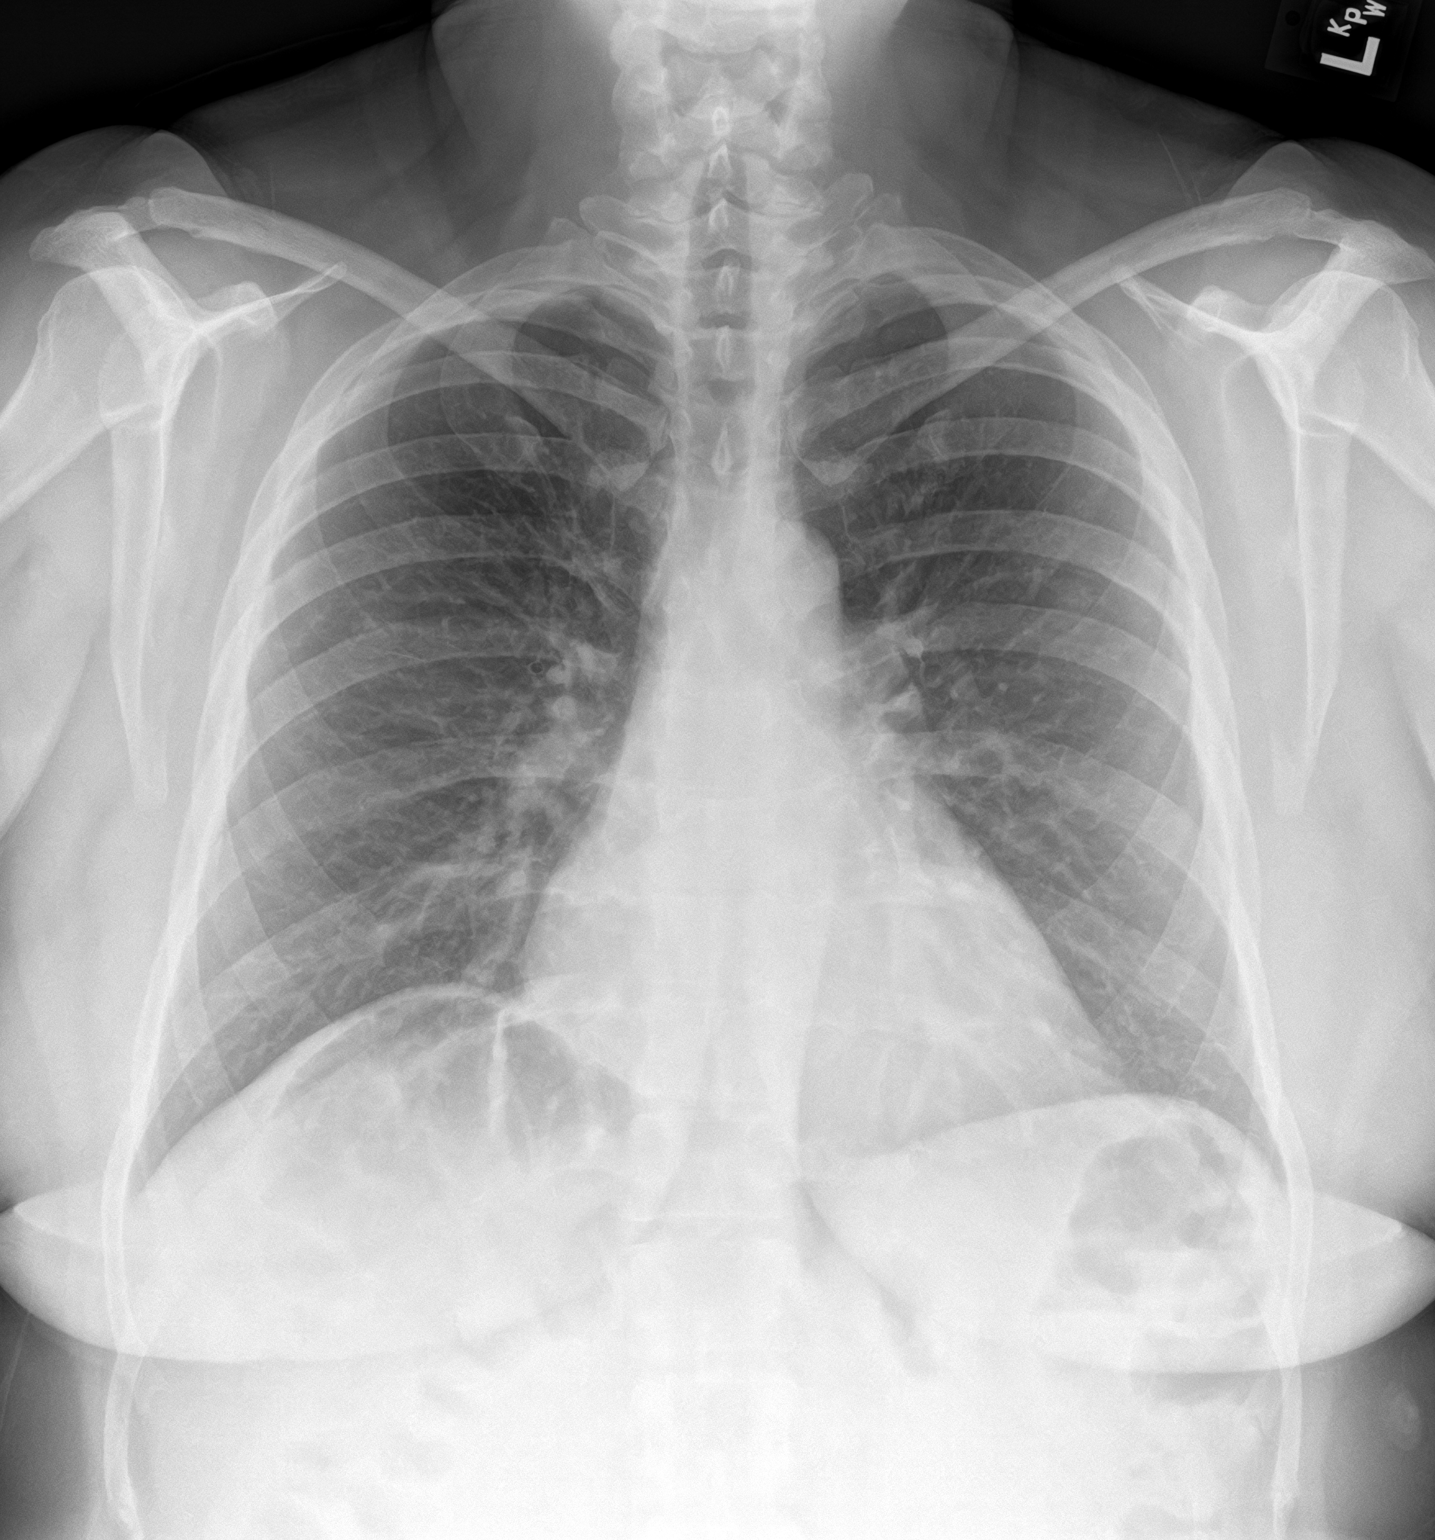
[im 2/2]
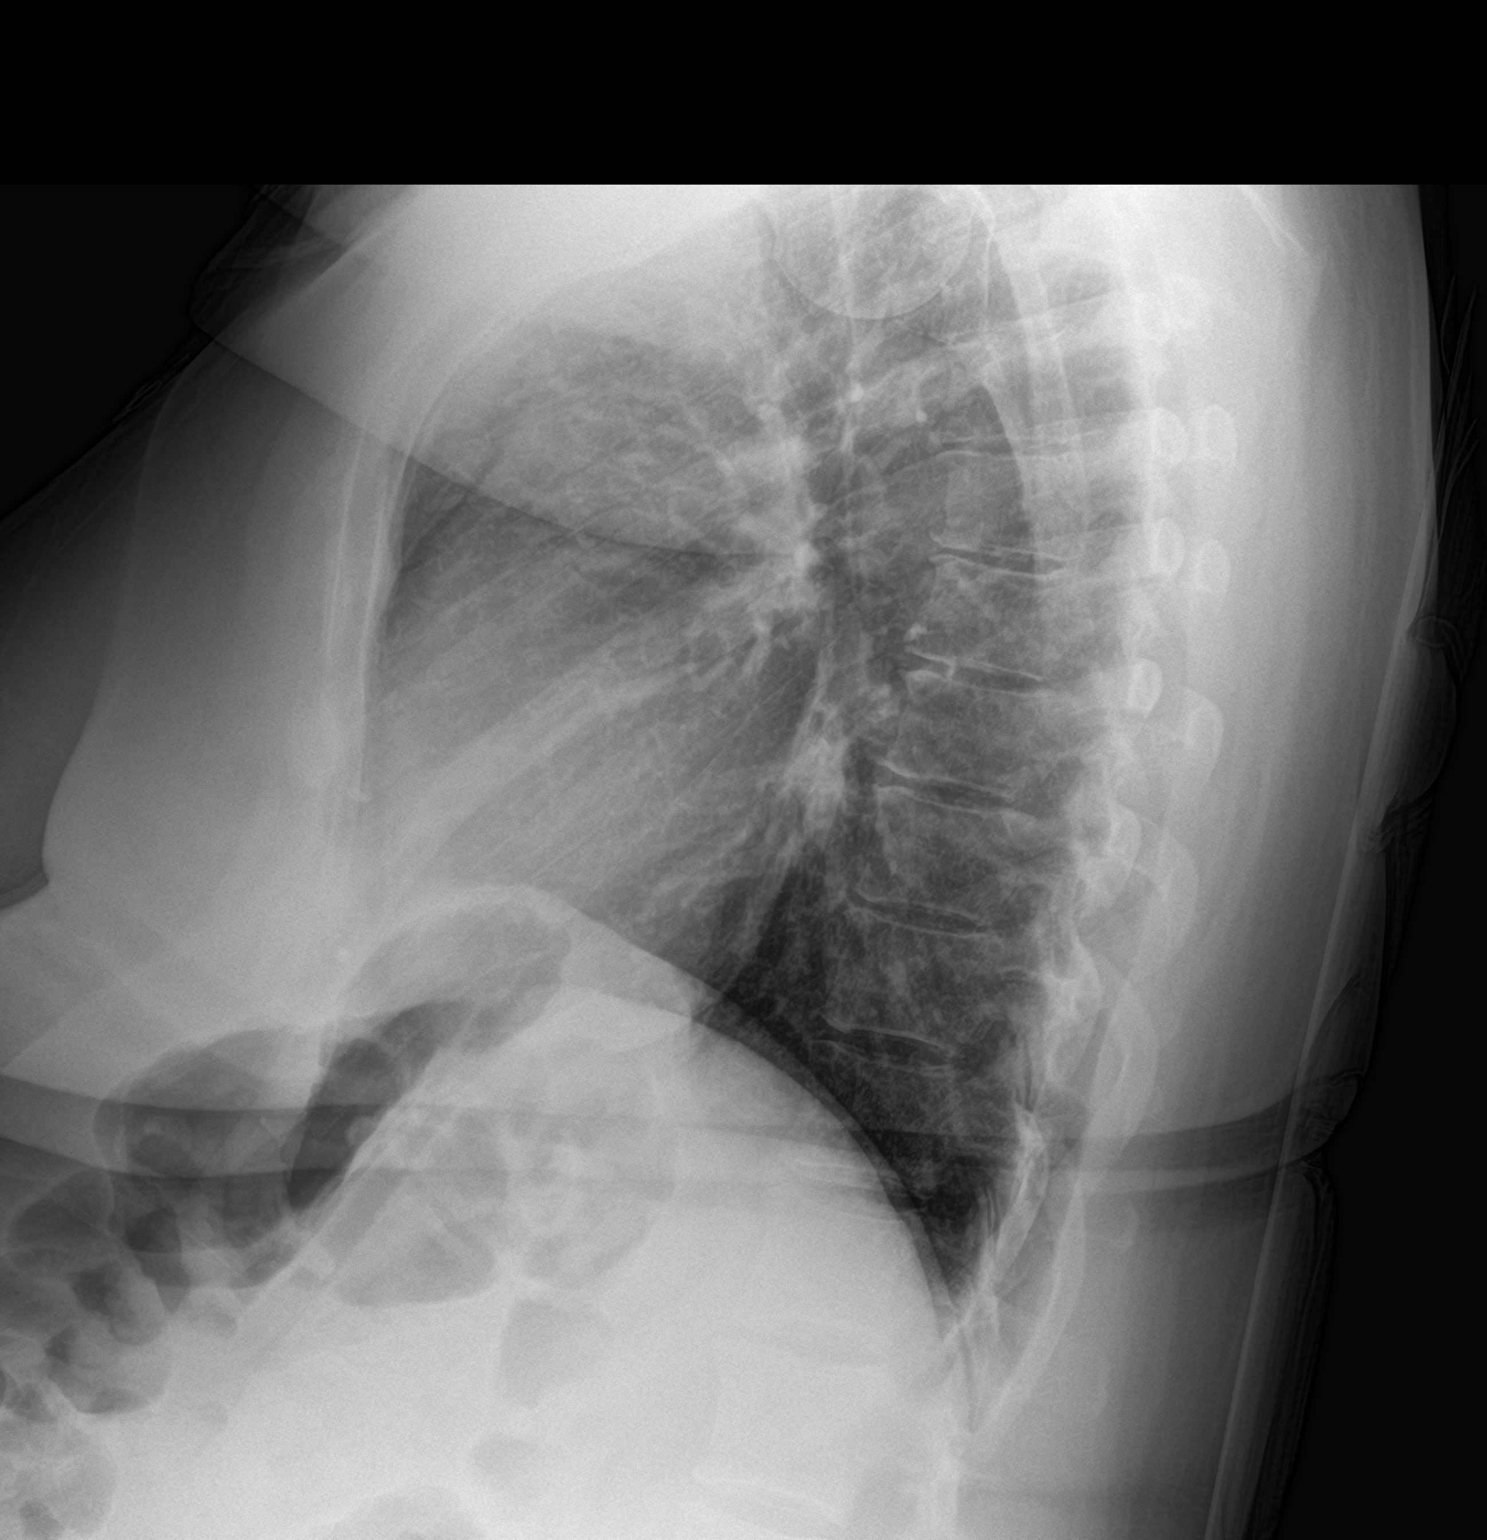

[2 of 2 positions shown; findings below may reference images not displayed]

FINDINGS: The heart size and mediastinal contours are within normal limits.
Both lungs are clear. The visualized skeletal structures are
unremarkable.
IMPRESSION: No active cardiopulmonary disease.

## 2023-07-26 ENCOUNTER — Other Ambulatory Visit: Payer: Self-pay

## 2023-07-26 ENCOUNTER — Emergency Department
Admission: EM | Admit: 2023-07-26 | Discharge: 2023-07-26 | Disposition: A | Discharge status: Home / Self Care | Attending: Emergency Medicine | Admitting: Emergency Medicine

## 2023-07-26 ENCOUNTER — Emergency Department

## 2023-07-26 DIAGNOSIS — R1011 Right upper quadrant pain: Secondary | ICD-10-CM | POA: Diagnosis present

## 2023-07-26 DIAGNOSIS — K8020 Calculus of gallbladder without cholecystitis without obstruction: Secondary | ICD-10-CM | POA: Insufficient documentation

## 2023-07-26 DIAGNOSIS — K8050 Calculus of bile duct without cholangitis or cholecystitis without obstruction: Secondary | ICD-10-CM

## 2023-07-26 LAB — COMPREHENSIVE METABOLIC PANEL WITH GFR
ALT: 35 U/L (ref 0–44)
AST: 36 U/L (ref 15–41)
Albumin: 4 g/dL (ref 3.5–5.0)
Alkaline Phosphatase: 101 U/L (ref 38–126)
Anion gap: 11 (ref 5–15)
BUN: 10 mg/dL (ref 6–20)
CO2: 26 mmol/L (ref 22–32)
Calcium: 9 mg/dL (ref 8.9–10.3)
Chloride: 102 mmol/L (ref 98–111)
Creatinine, Ser: 0.85 mg/dL (ref 0.44–1.00)
GFR, Estimated: >60 mL/min (ref >60)
Glucose, Bld: 93 mg/dL (ref 70–99)
Potassium: 3.4 mmol/L — ABNORMAL LOW (ref 3.5–5.1)
Sodium: 139 mmol/L (ref 135–145)
Total Bilirubin: 0.7 mg/dL (ref 0.0–1.2)
Total Protein: 7.3 g/dL (ref 6.5–8.1)

## 2023-07-26 LAB — CBC
HCT: 42 % (ref 36.0–46.0)
Hemoglobin: 13.6 g/dL (ref 12.0–15.0)
MCH: 25.3 pg — ABNORMAL LOW (ref 26.0–34.0)
MCHC: 32.4 g/dL (ref 30.0–36.0)
MCV: 78.2 fL — ABNORMAL LOW (ref 80.0–100.0)
Platelets: 408 10*3/uL — ABNORMAL HIGH (ref 150–400)
RBC: 5.37 MIL/uL — ABNORMAL HIGH (ref 3.87–5.11)
RDW: 14.9 % (ref 11.5–15.5)
WBC: 11.3 10*3/uL — ABNORMAL HIGH (ref 4.0–10.5)
nRBC: 0 % (ref 0.0–0.2)

## 2023-07-26 LAB — TROPONIN I (HIGH SENSITIVITY): Troponin I (High Sensitivity): 3 ng/L (ref <18)

## 2023-07-26 LAB — LIPASE, BLOOD: Lipase: 64 U/L — ABNORMAL HIGH (ref 11–51)

## 2023-07-26 MED ORDER — SODIUM CHLORIDE 0.9 % IV BOLUS
1000.0000 mL | Freq: Once | INTRAVENOUS | Status: AC
  Administered 2023-07-26: 1000 mL via INTRAVENOUS

## 2023-07-26 MED ORDER — OXYCODONE HCL 5 MG PO TABS: 5.0000 mg | ORAL_TABLET | Freq: Three times a day (TID) | ORAL | 0 refills | Status: DC | PRN

## 2023-07-26 MED ORDER — MORPHINE SULFATE (PF) 4 MG/ML IV SOLN
4.0000 mg | Freq: Once | INTRAVENOUS | Status: AC
  Administered 2023-07-26: 4 mg via INTRAVENOUS
  Filled 2023-07-26: qty 1

## 2023-07-26 MED ORDER — HYDROMORPHONE HCL 1 MG/ML IJ SOLN
1.0000 mg | Freq: Once | INTRAMUSCULAR | Status: AC
  Administered 2023-07-26: 1 mg via INTRAVENOUS
  Filled 2023-07-26: qty 1

## 2023-07-26 MED ORDER — ONDANSETRON HCL 4 MG/2ML IJ SOLN
4.0000 mg | Freq: Once | INTRAMUSCULAR | Status: AC
  Administered 2023-07-26: 4 mg via INTRAVENOUS
  Filled 2023-07-26: qty 2

## 2023-07-26 NOTE — ED Notes (Signed)
 Assisted pt to bathroom pt steady on feet with little assistance. Urine specimen was provided.

## 2023-07-26 NOTE — ED Provider Notes (Signed)
 [facility] Provider Note    Event Date/Time   First MD Initiated Contact with Patient 07/26/23 1510     (approximate)   History   Abdominal pain   HPI  [name] is a 43 y.o. female who presents to the emergency department today with concerns for right upper quadrant abdominal pain.  Started a couple of hours prior to my evaluation.  Gradually has gotten worse tonight noticed severe.  Has been accompanied by nausea but no vomiting.  Patient had finished eating about an hour and a half prior to the pain starting.  Patient had an episode of similar albeit less severe pain a couple of months ago.  Had been seen in the emergency department.  Per chart review had a CT abdomen pelvis which did not show any acute abnormality.  It was read as no gallstones.  Patient denies any fevers.     Physical Exam   Triage Vital Signs: ED Triage Vitals  Encounter Vitals Group     BP 07/26/23 1459 133/87     Girls Systolic BP Percentile --      Girls Diastolic BP Percentile --      Boys Systolic BP Percentile --      Boys Diastolic BP Percentile --      Pulse Rate 07/26/23 1459 99     Resp 07/26/23 1459 (!) 22     Temp 07/26/23 1459 97.6 F (36.4 C)     Temp Source 07/26/23 1459 Oral     SpO2 07/26/23 1459 100 %     Weight 07/26/23 1500 237 lb (107.5 kg)     Height 07/26/23 1500 5' 2 (1.575 m)     Head Circumference --      Peak Flow --      Pain Score 07/26/23 1458 10     Pain Loc --      Pain Education --      Exclude from Growth Chart --     Most recent vital signs: Vitals:   07/26/23 1459  BP: 133/87  Pulse: 99  Resp: (!) 22  Temp: 97.6 F (36.4 C)  SpO2: 100%   General: Awake, alert, oriented. CV:  Good peripheral perfusion. Regular rate and rhythm. Resp:  Normal effort. Lungs clear. Abd:  No distention. Tender to palpation in the RUQ and epigastric region. Soft.  ED Results / Procedures / Treatments   Labs (all labs ordered  are listed, but only abnormal results are displayed) Labs Reviewed  CBC - Abnormal; Notable for the following components:      Result Value   WBC 11.3 (*)    RBC 5.37 (*)    MCV 78.2 (*)    MCH 25.3 (*)    Platelets 408 (*)    All other components within normal limits  COMPREHENSIVE METABOLIC PANEL WITH GFR - Abnormal; Notable for the following components:   Potassium 3.4 (*)    All other components within normal limits  LIPASE, BLOOD - Abnormal; Notable for the following components:   Lipase 64 (*)    All other components within normal limits  POC URINE PREG, ED  TROPONIN I (HIGH SENSITIVITY)  TROPONIN I (HIGH SENSITIVITY)     EKG  I, Guadalupe Eagles, attending physician, personally viewed and interpreted this EKG  EKG Time: 1500 Rate: 97 Rhythm: normal sinus rhythm Axis: normal Intervals: qtc 439 QRS: narrow ST changes: no st elevation Impression: normal ekg   RADIOLOGY I independently interpreted  and visualized the CXR. My interpretation: No pneumonia Radiology interpretation:  IMPRESSION:  No acute abnormality.   US  RUQ IMPRESSION:  Cholelithiasis without sonographic evidence of acute cholecystitis.     PROCEDURES:  Critical Care performed: No    MEDICATIONS ORDERED IN ED: Medications - No data to display   IMPRESSION / MDM / ASSESSMENT AND PLAN / ED COURSE  I reviewed the triage vital signs and the nursing notes.                              Differential diagnosis includes, but is not limited to, gastritis, gallbladder disease, pancreatitis, pneumonia  Patient's presentation is most consistent with acute presentation with potential threat to life or bodily function.  Patient presented to the emergency department today because of concerns for right upper quadrant abdominal pain.  On exam patient is tender in the right upper quadrant epigastric area.  Blood work with slightly elevated lipase.  Given the location of pain of right upper quadrant  ultrasound was performed.  This did show a gallstone but no signs of cholecystitis.  Patient felt significant improvement here in the emergency department.  I do think likely patient's gallstone is the etiology of her pain.  Discussed finding with patient. I have concern for significant pancreatitis or choledocholithiasis. Given improvement I think is reasonable for patient be discharged.  Will give patient dietary guidelines.  Additionally will give patient surgery follow-up.  Will give patient pain medications if pain was to return.     FINAL CLINICAL IMPRESSION(S) / ED DIAGNOSES   Final diagnoses:  Biliary colic  Gallstones     Note:  This document was prepared using Dragon voice recognition software and may include unintentional dictation errors.    Floy Roberts, MD 07/26/23 [id number]

## 2023-07-26 NOTE — ED Triage Notes (Signed)
 Pt to ED for upper abdominal cramping and sharp pain, bilateral, like my whole body is tight and mid lower chest pain. Nausea, no vomiting. Pain radiates through to back and wraps around to back on both sides. Pain worse on RUQ. Has gallbladder. Pt has had similar episodes last few months but this is the worst. Pt is clammy.

## 2023-07-28 ENCOUNTER — Encounter: Payer: Self-pay | Admitting: Nurse Practitioner

## 2023-07-28 ENCOUNTER — Ambulatory Visit: Payer: Self-pay | Admitting: Family Medicine

## 2023-07-28 ENCOUNTER — Ambulatory Visit: Admitting: Nurse Practitioner

## 2023-07-28 VITALS — BP 120/74 | HR 98 | Temp 98.4°F | Ht 62.5 in | Wt 233.6 lb

## 2023-07-28 DIAGNOSIS — J0110 Acute frontal sinusitis, unspecified: Secondary | ICD-10-CM | POA: Diagnosis not present

## 2023-07-28 DIAGNOSIS — J321 Chronic frontal sinusitis: Secondary | ICD-10-CM | POA: Insufficient documentation

## 2023-07-28 MED ORDER — AMOXICILLIN-POT CLAVULANATE 875-125 MG PO TABS: 1.0000 | ORAL_TABLET | Freq: Two times a day (BID) | ORAL | 0 refills | Status: AC

## 2023-07-28 MED ORDER — PREDNISONE 20 MG PO TABS: 40.0000 mg | ORAL_TABLET | Freq: Every day | ORAL | 0 refills | Status: AC

## 2023-07-28 MED ORDER — ALBUTEROL SULFATE HFA 108 (90 BASE) MCG/ACT IN AERS: 2.0000 | INHALATION_SPRAY | Freq: Four times a day (QID) | RESPIRATORY_TRACT | 0 refills | Status: AC | PRN

## 2023-07-28 NOTE — Patient Instructions (Signed)

## 2023-07-28 NOTE — Progress Notes (Signed)
 BP 120/74   Pulse 98   Temp 98.4 F (36.9 C) (Oral)   Ht 5' 2.5 (1.588 m)   Wt 233 lb 9.6 oz (106 kg)   SpO2 97%   BMI 42.05 kg/m    Subjective:    Patient ID: [name], female    DOB: 1980/07/18, 43 y.o.   MRN: [id number]  HPI: [name] is a 43 y.o. female  Chief Complaint  Patient presents with   URI    Patient states she has been having sinus congestion, pressure, drainage, cough, and night sweats for the last week. States her temp got up to 100 a few nights as well. States she has tried taking Dayquil which she states that it did not help at all.    UPPER RESPIRATORY TRACT INFECTION Started to feel bad one week ago. Having night sweats and fever. Fever: yes Cough: yes Shortness of breath: no Wheezing: yes Chest pain: no Chest tightness: no Chest congestion: yes Nasal congestion: yes Runny nose: yes Post nasal drip: yes Sneezing: no Sore throat: yes only to upper aspect Swollen glands: no Sinus pressure: yes Headache: no Face pain: yes Toothache: no Ear pain: none Ear pressure: yes bilateral Eyes red/itching:no Eye drainage/crusting: no  Vomiting: nausea only, but had recent diagnosis of gallstones Rash: no Fatigue: yes Sick contacts: no Strep contacts: no  Context: fluctuating Recurrent sinusitis: no Relief with OTC cold/cough medications: no  Treatments attempted: cold/sinus, mucinex, and cough syrup    Relevant past medical, surgical, family and social history reviewed and updated as indicated. Interim medical history since our last visit reviewed. Allergies and medications reviewed and updated.  Review of Systems  Constitutional:  Positive for fatigue and fever. Negative for activity change, appetite change and chills.  HENT:  Positive for congestion, postnasal drip, rhinorrhea, sinus pressure and sore throat. Negative for ear discharge, ear pain, facial swelling, sinus pain, sneezing and voice change.    Respiratory:  Positive for cough, shortness of breath and wheezing. Negative for chest tightness.   Cardiovascular:  Negative for chest pain, palpitations and leg swelling.  Gastrointestinal:  Positive for nausea. Negative for abdominal distention, abdominal pain, constipation, diarrhea and vomiting.  Endocrine: Negative.   Musculoskeletal:  Positive for myalgias.  Neurological:  Positive for headaches. Negative for dizziness and numbness.  Psychiatric/Behavioral: Negative.      Per HPI unless specifically indicated above     Objective:    BP 120/74   Pulse 98   Temp 98.4 F (36.9 C) (Oral)   Ht 5' 2.5 (1.588 m)   Wt 233 lb 9.6 oz (106 kg)   SpO2 97%   BMI 42.05 kg/m   Wt Readings from Last 3 Encounters:  07/28/23 233 lb 9.6 oz (106 kg)  07/26/23 237 lb (107.5 kg)  07/23/23 237 lb (107.5 kg)    Physical Exam Vitals and nursing note reviewed.  Constitutional:      General: She is awake. She is not in acute distress.    Appearance: She is well-developed and well-groomed. She is obese. She is ill-appearing. She is not toxic-appearing.  HENT:     Head: Normocephalic.     Right Ear: Hearing, ear canal and external ear normal. A middle ear effusion is present. There is no impacted cerumen. Tympanic membrane is not injected or perforated.     Left Ear: Hearing, ear canal and external ear normal. A middle ear effusion is present. There is no impacted cerumen. Tympanic membrane is  not injected or perforated.     Nose: Rhinorrhea present. Rhinorrhea is clear.     Right Sinus: Frontal sinus tenderness present. No maxillary sinus tenderness.     Left Sinus: Frontal sinus tenderness present. No maxillary sinus tenderness.     Mouth/Throat:     Mouth: Mucous membranes are moist.     Pharynx: Posterior oropharyngeal erythema (mild) present. No pharyngeal swelling or oropharyngeal exudate.   Eyes:     General: Lids are normal.        Right eye: No discharge.        Left eye: No  discharge.     Conjunctiva/sclera: Conjunctivae normal.     Pupils: Pupils are equal, round, and reactive to light.   Neck:     Thyroid : No thyromegaly.     Vascular: No carotid bruit.   Cardiovascular:     Rate and Rhythm: Normal rate and regular rhythm.     Heart sounds: Normal heart sounds. No murmur heard.    No gallop.  Pulmonary:     Effort: Pulmonary effort is normal. No accessory muscle usage or respiratory distress.     Breath sounds: Wheezing (occasional expiratory noted throughout) present. No decreased breath sounds or rales.  Abdominal:     General: Bowel sounds are normal.     Palpations: Abdomen is soft. There is no hepatomegaly or splenomegaly.   Musculoskeletal:     Cervical back: Normal range of motion and neck supple.     Right lower leg: No edema.     Left lower leg: No edema.  Lymphadenopathy:     Head:     Right side of head: Submandibular and tonsillar adenopathy present. No submental, preauricular or posterior auricular adenopathy.     Left side of head: Submandibular and tonsillar adenopathy present. No submental, preauricular or posterior auricular adenopathy.     Cervical: No cervical adenopathy.   Skin:    General: Skin is warm and dry.   Neurological:     Mental Status: She is alert and oriented to person, place, and time.   Psychiatric:        Attention and Perception: Attention normal.        Mood and Affect: Mood normal.        Speech: Speech normal.        Behavior: Behavior normal. Behavior is cooperative.        Thought Content: Thought content normal.     Results for orders placed or performed during the hospital encounter of 07/26/23  CBC   Collection Time: 07/26/23  3:02 PM  Result Value Ref Range   WBC 11.3 (H) 4.0 - 10.5 K/uL   RBC 5.37 (H) 3.87 - 5.11 MIL/uL   Hemoglobin 13.6 12.0 - 15.0 g/dL   HCT 57.9 63.9 - 53.9 %   MCV 78.2 (L) 80.0 - 100.0 fL   MCH 25.3 (L) 26.0 - 34.0 pg   MCHC 32.4 30.0 - 36.0 g/dL   RDW 85.0 88.4  - 84.4 %   Platelets 408 (H) 150 - 400 K/uL   nRBC 0.0 0.0 - 0.2 %  Comprehensive metabolic panel   Collection Time: 07/26/23  3:02 PM  Result Value Ref Range   Sodium 139 135 - 145 mmol/L   Potassium 3.4 (L) 3.5 - 5.1 mmol/L   Chloride 102 98 - 111 mmol/L   CO2 26 22 - 32 mmol/L   Glucose, Bld 93 70 - 99 mg/dL   BUN 10 6 -  20 mg/dL   Creatinine, Ser 9.14 0.44 - 1.00 mg/dL   Calcium 9.0 8.9 - 89.6 mg/dL   Total Protein 7.3 6.5 - 8.1 g/dL   Albumin 4.0 3.5 - 5.0 g/dL   AST 36 15 - 41 U/L   ALT 35 0 - 44 U/L   Alkaline Phosphatase 101 38 - 126 U/L   Total Bilirubin 0.7 0.0 - 1.2 mg/dL   GFR, Estimated >39 >39 mL/min   Anion gap 11 5 - 15  Lipase, blood   Collection Time: 07/26/23  3:02 PM  Result Value Ref Range   Lipase 64 (H) 11 - 51 U/L  Troponin I (High Sensitivity)   Collection Time: 07/26/23  3:02 PM  Result Value Ref Range   Troponin I (High Sensitivity) 3 <18 ng/L      Assessment & Plan:   Problem List Items Addressed This Visit       Respiratory   Frontal sinusitis - Primary   Acute and ongoing for one week with no improvement, if about to travel soon.  Will send in Augmentin  BID for 7 days and Prednisone  40 MG daily for 5 days.  She is higher risk due to MS treatments.  Note written for school time off.  Recommend: - Increased rest - Increasing Fluids - Acetaminophen / ibuprofen  as needed for fever/pain.  - Salt water gargling, chloraseptic spray and throat lozenges - Mucinex.  - Humidifying the air.       Relevant Medications   amoxicillin -clavulanate (AUGMENTIN ) 875-125 MG tablet   predniSONE  (DELTASONE ) 20 MG tablet     Follow up plan: Return if symptoms worsen or fail to improve.

## 2023-07-28 NOTE — Assessment & Plan Note (Signed)
 Acute and ongoing for one week with no improvement, if about to travel soon.  Will send in Augmentin  BID for 7 days and Prednisone  40 MG daily for 5 days.  She is higher risk due to MS treatments.  Note written for school time off.  Recommend: - Increased rest - Increasing Fluids - Acetaminophen / ibuprofen  as needed for fever/pain.  - Salt water gargling, chloraseptic spray and throat lozenges - Mucinex.  - Humidifying the air.

## 2023-07-30 ENCOUNTER — Encounter (INDEPENDENT_AMBULATORY_CARE_PROVIDER_SITE_OTHER): Payer: Self-pay | Admitting: Internal Medicine

## 2023-07-30 ENCOUNTER — Ambulatory Visit (INDEPENDENT_AMBULATORY_CARE_PROVIDER_SITE_OTHER): Admitting: Internal Medicine

## 2023-07-30 VITALS — BP 126/89 | HR 94 | Temp 98.2°F | Ht 63.0 in | Wt 229.0 lb

## 2023-07-30 DIAGNOSIS — T50905A Adverse effect of unspecified drugs, medicaments and biological substances, initial encounter: Secondary | ICD-10-CM

## 2023-07-30 DIAGNOSIS — F419 Anxiety disorder, unspecified: Secondary | ICD-10-CM

## 2023-07-30 DIAGNOSIS — G35 Multiple sclerosis: Secondary | ICD-10-CM

## 2023-07-30 DIAGNOSIS — Z6841 Body Mass Index (BMI) 40.0 and over, adult: Secondary | ICD-10-CM

## 2023-07-30 DIAGNOSIS — F32A Depression, unspecified: Secondary | ICD-10-CM | POA: Diagnosis not present

## 2023-07-30 DIAGNOSIS — E66813 Obesity, class 3: Secondary | ICD-10-CM

## 2023-07-30 DIAGNOSIS — G4700 Insomnia, unspecified: Secondary | ICD-10-CM

## 2023-07-30 DIAGNOSIS — Z0289 Encounter for other administrative examinations: Secondary | ICD-10-CM

## 2023-07-30 NOTE — Progress Notes (Signed)
 [address] [place], [place], [place] [address] Office: [phone]  /  Fax: [phone]   Initial Consultation    [name] was seen in clinic today to evaluate for obesity. She is interested in losing weight to improve overall health and reduce the risk of weight related complications. She presents today to review program treatment options, initial physical assessment, and evaluation.    Anthropometrics and Bioimpedance Analysis   Body mass index is 40.57 kg/m. Body Fat Mass : 46 % Visceral Fat Mass Rating : 13 Waist to Height Ratio: TBD  Obesity Related Diseases and Complications  Obesity Quality of Life and Psychosocial Complications: Reduced health-related quality of life and Problems with body image  Cardiometabolic: Fatigue  Biomechanical: Low back pain, GERD, and Urinary incontinence   Weight Related History  She was referred by: PCP  When asked what they would like to accomplish? She states: Adopt a healthier eating pattern and lifestyle, Improve energy levels and physical activity, and Improve quality of life  Weight history: Started after 1st  and 2nd child and possibly medication changes  Highest weight: 233  Contributing factors: family history of obesity, disruption of circadian rhythm / sleep disordered breathing, consumption of processed foods, use of obesogenic medications: Beta-blockers, Psychotropic medications, and Steroids, reduced physical activity, chronic skipping of meals, strong orexigenic signaling and/or inadequate inhibitory control , multiple weight loss attempts in the past, sedentary job, need for convenient foods, and difficulty with meal preparation  Prior weight loss attempts: Weight Watchers and paleo, optavia - felt fatigue  Current or previous pharmacotherapy: Is interested in pharmacotherapy and GLP-1 - was on Wegovy  1 month, lost coverage  Response to medication: Lost weight initially but was unable to sustain weight  loss  Current nutrition plan: None  Greatest challenge with dieting: felt burdensome.  Current level of physical activity: Low levels of physical activity at present  Barriers to Exercise: energy and leg weakness from MS  Readiness and Motivation  On a scale from 0 to 10 How ready are you to make changes to your eating and physical activity to lose weight? 6 How important is it for you to lose weight right now ? 9 How confident are you that you can lose weight if you try? 7  Past Medical History   Past Medical History:  Diagnosis Date   Allergy    Anxiety    Depression    Migraine    MS (multiple sclerosis) (HCC)    Multiple sclerosis (HCC)    Spinal stenosis      Objective    BP 126/89   Pulse 94   Temp 98.2 F (36.8 C)   Ht 5' 3 (1.6 m)   Wt 229 lb (103.9 kg)   SpO2 98%   BMI 40.57 kg/m  She was weighed on the bioimpedance scale: Body mass index is 40.57 kg/m.    General:  Alert, oriented and cooperative. Patient is in no acute distress.  Respiratory: Normal respiratory effort, no problems with respiration noted   Gait: able to ambulate independently  Mental Status: Normal mood and affect. Normal behavior. Normal judgment and thought content.   Diagnostic Data Reviewed  BMET    Component Value Date/Time   NA 139 07/26/2023 1502   NA 142 12/18/2022 1028   NA 141 07/28/2013 0410   K 3.4 (L) 07/26/2023 1502   K 3.7 07/28/2013 0410   CL 102 07/26/2023 1502   CL 103 07/28/2013 0410   CO2 26 07/26/2023 1502  CO2 29 07/28/2013 0410   GLUCOSE 93 07/26/2023 1502   GLUCOSE 121 (H) 07/28/2013 0410   BUN 10 07/26/2023 1502   BUN 9 12/18/2022 1028   BUN 16 07/28/2013 0410   CREATININE 0.85 07/26/2023 1502   CREATININE 0.66 07/28/2013 0410   CALCIUM 9.0 07/26/2023 1502   CALCIUM 8.6 07/28/2013 0410   GFRNONAA >60 07/26/2023 1502   GFRNONAA >60 07/28/2013 0410   GFRAA 108 12/06/2019 0955   GFRAA >60 07/28/2013 0410   Lab Results  Component Value  Date   HGBA1C 5.6 06/06/2021   HGBA1C 5.5 12/06/2020   No results found for: INSULIN CBC    Component Value Date/Time   WBC 11.3 (H) 07/26/2023 1502   RBC 5.37 (H) 07/26/2023 1502   HGB 13.6 07/26/2023 1502   HGB 12.6 07/23/2023 0838   HCT 42.0 07/26/2023 1502   HCT 40.4 07/23/2023 0838   PLT 408 (H) 07/26/2023 1502   PLT 400 07/23/2023 0838   MCV 78.2 (L) 07/26/2023 1502   MCV 81 07/23/2023 0838   MCV 88 07/28/2013 0410   MCH 25.3 (L) 07/26/2023 1502   MCHC 32.4 07/26/2023 1502   RDW 14.9 07/26/2023 1502   RDW 14.7 07/23/2023 0838   RDW 13.4 07/28/2013 0410   Iron/TIBC/Ferritin/ %Sat No results found for: IRON, TIBC, FERRITIN, IRONPCTSAT Lipid Panel     Component Value Date/Time   CHOL 180 12/18/2022 1028   TRIG 111 12/18/2022 1028   HDL 58 12/18/2022 1028   CHOLHDL 2.6 12/02/2017 1018   LDLCALC 102 (H) 12/18/2022 1028   Hepatic Function Panel     Component Value Date/Time   PROT 7.3 07/26/2023 1502   PROT 6.9 12/18/2022 1028   PROT 6.7 07/25/2013 0410   ALBUMIN 4.0 07/26/2023 1502   ALBUMIN 4.6 12/18/2022 1028   ALBUMIN 3.3 (L) 07/25/2013 0410   AST 36 07/26/2023 1502   AST 20 07/25/2013 0410   ALT 35 07/26/2023 1502   ALT 18 07/25/2013 0410   ALKPHOS 101 07/26/2023 1502   ALKPHOS 49 07/25/2013 0410   BILITOT 0.7 07/26/2023 1502   BILITOT 0.3 12/18/2022 1028   BILITOT 0.4 07/25/2013 0410   BILIDIR 0.10 02/18/2018 0911      Component Value Date/Time   TSH 1.080 12/18/2022 1028    Medications  Outpatient Encounter Medications as of 07/30/2023  Medication Sig Note   albuterol  (VENTOLIN  HFA) 108 (90 Base) MCG/ACT inhaler Inhale 2 puffs into the lungs every 6 (six) hours as needed for wheezing or shortness of breath.    amoxicillin -clavulanate (AUGMENTIN ) 875-125 MG tablet Take 1 tablet by mouth 2 (two) times daily for 7 days.    amphetamine -dextroamphetamine  (ADDERALL  XR) 30 MG 24 hr capsule Take 1 capsule (30 mg total) by mouth daily.     baclofen  (LIORESAL ) 10 MG tablet TAKE 1 TABLET BY MOUTH FOUR TIMES DAILY AS NEEDED FOR MUSCLE SPASMS    buPROPion  (WELLBUTRIN  XL) 300 MG 24 hr tablet Take 1 tablet (300 mg total) by mouth daily.    Cholecalciferol 25 MCG (1000 UT) tablet Take 5,000 Units by mouth daily.  05/25/2015: Received from: [facility]   meclizine  (ANTIVERT ) 25 MG tablet Take 1 tablet (25 mg total) by mouth 3 (three) times daily as needed for dizziness.    ocrelizumab 600 mg in sodium chloride  0.9 % 500 mL Inject 600 mg into the vein every 6 (six) months. 06/24/2019: LAST TAKEN 6 MONTHS AGO - REPORTED 06/24/2019   oxyCODONE (ROXICODONE) 5  MG immediate release tablet Take 1 tablet (5 mg total) by mouth every 8 (eight) hours as needed for severe pain (pain score 7-10).    predniSONE  (DELTASONE ) 20 MG tablet Take 2 tablets (40 mg total) by mouth daily with breakfast for 5 days.    propranolol  (INDERAL ) 10 MG tablet Take 1 tablet (10 mg total) by mouth daily as needed. For severe anxiety attack    sertraline  (ZOLOFT ) 100 MG tablet Take 2 tablets (200 mg total) by mouth daily.    traZODone  (DESYREL ) 100 MG tablet Take 1 tablet (100 mg total) by mouth at bedtime.    No facility-administered encounter medications on file as of 07/30/2023.     Assessment and Plan   Weight gain due to medication  Class 3 severe obesity due to excess calories without serious comorbidity with body mass index (BMI) of 40.0 to 44.9 in adult  Relapsing remitting multiple sclerosis (HCC)       Obesity Obesity is a chronic disease characterized by excess body fat, leading to health complications. Body fat percentage is 46%, with a goal of under 34%. Visceral fat is 13, target is under 10. Weight gain began post-first child, exacerbated by MS medication changes. Family history of obesity. Challenges with physical activity due to MS-related fatigue and spinal lesions. Previous weight loss attempts include Wegovy , effective but  discontinued due to shortage and insurance issues. She expresses readiness to make lifestyle changes but is unsure how to proceed. Discussed obesity as a complex interplay of genetics, environment, and risk factors. Emphasized sustainable weight loss through nutrition, physical activity, behavioral modification, and medical interventions. Patients can lose 1-2 pounds per week, with long-term success seen in those engaged for 12-24 months. Medications like Wegovy  help with appetite control, but meal prep is crucial. Emphasized importance of home environment in supporting dietary changes. - Schedule intake appointment for metabolic rate testing and blood work. - Develop a personalized nutrition plan based on metabolic rate and blood work results. - Discuss potential use of weight loss medications if lifestyle modifications are insufficient. - Consider referral to a behavioral health specialist for support with dietary changes. - Encourage reduction of processed foods and increase in whole foods to reduce inflammation.  Multiple Sclerosis (MS) Relapsing-remitting MS with significant fatigue and spinal lesions contributing to decreased physical activity. MS progression impacts ability to maintain previous activity levels, leading to weight gain. Fatigue exacerbated by weight gain, affecting breathing and mobility. - Monitor MS symptoms and adjust weight management plan accordingly.  Depression and Anxiety Depression and anxiety, potentially exacerbated by weight issues. On sertraline , which may contribute to weight gain. Interested in discussing medication adjustments with primary care provider. Discussed potential for weight-neutral alternatives like Prozac and Lexapro. - Discuss potential adjustment of sertraline  with primary care provider to explore weight-neutral alternatives. - Consider referral to a behavioral health specialist for counseling support.  Insomnia Reports insomnia, potentially  influenced by medications such as Adderall  and bupropion . Trazodone  used for sleep but may contribute to weight gain. Discussed the balance of medication side effects and benefits. - Evaluate current medication regimen with primary care provider to address insomnia and potential weight gain.  General Health Maintenance Metabolically healthy with no history of diabetes, hypertension, or fatty liver disease. Focus on weight management to prevent future cardiometabolic issues. Emphasized importance of lifestyle modifications to support weight loss and prevent future health complications. - Encourage regular  follow-up appointments to monitor weight and overall health. - Promote lifestyle modifications to support weight  loss and prevent future health complications.  Follow-up Advised on next steps for weight management program, including scheduling intake appointments and understanding program structure. Visits every 2-3 weeks initially, then monthly after first three months. Emphasized importance of not scheduling appointments with primary care and this program on the same day due to insurance coverage limitations. - Schedule intake appointment with fasting requirements for metabolic testing and blood work. - Plan follow-up visits every 2-3 weeks initially, then monthly after the first three months.           Obesity Treatment and Action Plan:  Patient will work on garnering support from family and friends to begin weight loss journey. Will work on eliminating or reducing the presence of highly palatable, calorie dense foods in the home. Will complete provided nutritional and psychosocial assessment questionnaire before the next appointment. Will be scheduled for indirect calorimetry to determine resting energy expenditure in a fasting state.  This will allow us  to create a reduced calorie, high-protein meal plan to promote loss of fat mass while preserving muscle mass. Counseled on the health  benefits of losing 5%-15% of total body weight. Was counseled on nutritional approaches to weight loss and benefits of reducing processed foods and consuming plant-based foods and high quality protein as part of nutritional weight management. Was counseled on pharmacotherapy and role as an adjunct in weight management.   Education and Additional resources  She was weighed on the bioimpedance scale and results were discussed and documented in the synopsis.  We discussed obesity as a progressive, chronic disease and the importance of a more detailed evaluation of all the factors contributing to the disease.  We reviewed the basic principles in obesity management.   We discussed the importance of long term lifestyle changes which include nutrition, exercise and behavioral modification as well as the importance of customizing this to her specific health and social needs.  We reviewed the role of medical interventions including pharmacotherapy and surgical interventions.   We discussed the benefits of reaching a healthier weight to alleviate the symptoms of existing conditions and reduce the risks of the biomechanical, cardiometabolic and psychological effects of obesity.  We reviewed our program approach and philosophy, which are guided by the four pillars of obesity medicine.  We discussed how to prepare for intake appointment and the importance of fasting and avoidance of stimulants for at least 8 hours prior to indirect calorimetry.  [name] appears to be in the action stage of change and reports being ready to initiate intensive lifestyle and behavioral modifications as part of their weight loss journey.  Attestation  Reviewed by clinician on day of visit: allergies, medications, problem list, medical history, surgical history, family history, social history, and previous encounter notes pertinent to obesity diagnosis.  I have spent 46 minutes in the care of the patient  today including: 3 minutes before the visit reviewing and preparing the chart. 37 minutes face-to-face assessing and reviewing listed medical problems as outlined in obesity care plan, providing nutritional and behavioral counseling on topics outlined in the obesity care plan, counseling regarding anti-obesity medication as outlined in obesity care plan, independently interpreting test results and goals of care, as described in assessment and plan, reviewing and discussing biometric information and progress, and reviewing latest PCP notes and specialist consultations 6 minutes after the visit updating chart and documentation of encounter.   Lucas Parker, MD, ABIM, [place]

## 2023-08-04 ENCOUNTER — Encounter (INDEPENDENT_AMBULATORY_CARE_PROVIDER_SITE_OTHER): Payer: Self-pay

## 2023-08-04 ENCOUNTER — Other Ambulatory Visit: Payer: Self-pay

## 2023-08-04 ENCOUNTER — Encounter: Payer: Self-pay | Admitting: Neurology

## 2023-08-04 MED ORDER — AMPHETAMINE-DEXTROAMPHET ER 30 MG PO CP24: 30.0000 mg | ORAL_CAPSULE | Freq: Every day | ORAL | 0 refills | Status: DC

## 2023-08-06 ENCOUNTER — Other Ambulatory Visit: Payer: Self-pay | Admitting: Neurology

## 2023-08-06 NOTE — Telephone Encounter (Signed)
 Refill has been taking Care of on 08/04/2023 Please Deny Request

## 2023-08-07 ENCOUNTER — Ambulatory Visit: Admitting: General Surgery

## 2023-08-12 ENCOUNTER — Encounter: Payer: Self-pay | Admitting: General Surgery

## 2023-08-12 ENCOUNTER — Ambulatory Visit: Admitting: General Surgery

## 2023-08-12 ENCOUNTER — Ambulatory Visit: Payer: Self-pay | Admitting: General Surgery

## 2023-08-12 VITALS — BP 125/75 | HR 125 | Temp 98.1°F | Ht 63.0 in | Wt 235.0 lb

## 2023-08-12 DIAGNOSIS — K8050 Calculus of bile duct without cholangitis or cholecystitis without obstruction: Secondary | ICD-10-CM

## 2023-08-12 NOTE — Patient Instructions (Signed)
 You have requested to have your gallbladder removed. This will be done at [facility] with Dr. Maurine Minister.  If you are on any injectable weight loss medication, you will need to stop taking your GLP-1 injectable (weight loss) medications 8 days before your surgery to avoid any complications with anesthesia.   You will most likely be out of work 1-2 weeks for this surgery.  If you have FMLA or disability paperwork that needs filled out you may drop this off at our office or this can be faxed to [phone].  You will return after your post-op appointment with a lifting restriction for approximately 4 more weeks.  You will be able to eat anything you would like to following surgery. But, start by eating a bland diet and advance this as tolerated. The Gallbladder diet is below, please go as closely by this diet as possible prior to surgery to avoid any further attacks.  Please see the (blue)pre-care form that you have been given today. Our surgery scheduler will call you to verify surgery date and to go over information.   If you have any questions, please call our office.  Laparoscopic Cholecystectomy Laparoscopic cholecystectomy is surgery to remove the gallbladder. The gallbladder is located in the upper right part of the abdomen, behind the liver. It is a storage sac for bile, which is produced in the liver. Bile aids in the digestion and absorption of fats. Cholecystectomy is often done for inflammation of the gallbladder (cholecystitis). This condition is usually caused by a buildup of gallstones (cholelithiasis) in the gallbladder. Gallstones can block the flow of bile, and that can result in inflammation and pain. In severe cases, emergency surgery may be required. If emergency surgery is not required, you will have time to prepare for the procedure. Laparoscopic surgery is an alternative to open surgery. Laparoscopic surgery has a shorter recovery time. Your common bile duct may also need  to be examined during the procedure. If stones are found in the common bile duct, they may be removed. LET [facility] CARE PROVIDER KNOW ABOUT: Any allergies you have. All medicines you are taking, including vitamins, herbs, eye drops, creams, and over-the-counter medicines. Previous problems you or members of your family have had with the use of anesthetics. Any blood disorders you have. Previous surgeries you have had.  Any medical conditions you have. RISKS AND COMPLICATIONS Generally, this is a safe procedure. However, problems may occur, including: Infection. Bleeding. Allergic reactions to medicines. Damage to other structures or organs. A stone remaining in the common bile duct. A bile leak from the cyst duct that is clipped when your gallbladder is removed. The need to convert to open surgery, which requires a larger incision in the abdomen. This may be necessary if your surgeon thinks that it is not safe to continue with a laparoscopic procedure. BEFORE THE PROCEDURE Ask your health care provider about: Changing or stopping your regular medicines. This is especially important if you are taking diabetes medicines or blood thinners. Taking medicines such as aspirin and ibuprofen. These medicines can thin your blood. Do not take these medicines before your procedure if your health care provider instructs you not to. Follow instructions from your health care provider about eating or drinking restrictions. Let your health care provider know if you develop a cold or an infection before surgery. Plan to have someone take you home after the procedure. Ask your health care provider how your surgical site will be marked or identified. You may  be given antibiotic medicine to help prevent infection. PROCEDURE To reduce your risk of infection: Your health care team will wash or sanitize their hands. Your skin will be washed with soap. An IV tube may be inserted into one of your  veins. You will be given a medicine to make you fall asleep (general anesthetic). A breathing tube will be placed in your mouth. The surgeon will make several small cuts (incisions) in your abdomen. A thin, lighted tube (laparoscope) that has a tiny camera on the end will be inserted through one of the small incisions. The camera on the laparoscope will send a picture to a TV screen (monitor) in the operating room. This will give the surgeon a good view inside your abdomen. A gas will be pumped into your abdomen. This will expand your abdomen to give the surgeon more room to perform the surgery. Other tools that are needed for the procedure will be inserted through the other incisions. The gallbladder will be removed through one of the incisions. After your gallbladder has been removed, the incisions will be closed with stitches (sutures), staples, or skin glue. Your incisions may be covered with a bandage (dressing). The procedure may vary among health care providers and hospitals. AFTER THE PROCEDURE Your blood pressure, heart rate, breathing rate, and blood oxygen level will be monitored often until the medicines you were given have worn off. You will be given medicines as needed to control your pain.   This information is not intended to replace advice given to you by your health care provider. Make sure you discuss any questions you have with your health care provider.   Document Released: 01/21/2005 Document Revised: 10/12/2014 Document Reviewed: 09/02/2012 Elsevier Interactive Patient Education 2016 Else[employer] Inc.   Low-Fat Diet for Gallbladder Conditions A low-fat diet can be helpful if you have pancreatitis or a gallbladder condition. With these conditions, your pancreas and gallbladder have trouble digesting fats. A healthy eating plan with less fat will help rest your pancreas and gallbladder and reduce your symptoms. WHAT DO I NEED TO KNOW ABOUT THIS DIET? Eat a low-fat  diet. Reduce your fat intake to less than 20-30% of your total daily calories. This is less than 50-60 g of fat per day. Remember that you need some fat in your diet. Ask your dietician what your daily goal should be. Choose nonfat and low-fat healthy foods. Look for the words "nonfat," "low fat," or "fat free." As a guide, look on the label and choose foods with less than 3 g of fat per serving. Eat only one serving. Avoid alcohol. Do not smoke. If you need help quitting, talk with your health care provider. Eat small frequent meals instead of three large heavy meals. WHAT FOODS CAN I EAT? Grains Include healthy grains and starches such as potatoes, wheat bread, fiber-rich cereal, and brown rice. Choose whole grain options whenever possible. In adults, whole grains should account for 45-65% of your daily calories.  Fruits and Vegetables Eat plenty of fruits and vegetables. Fresh fruits and vegetables add fiber to your diet. Meats and Other Protein Sources Eat lean meat such as chicken and pork. Trim any fat off of meat before cooking it. Eggs, fish, and beans are other sources of protein. In adults, these foods should account for 10-35% of your daily calories. Dairy Choose low-fat milk and dairy options. Dairy includes fat and protein, as well as calcium.  Fats and Oils Limit high-fat foods such as fried foods, sweets, baked  goods, sugary drinks.  Other Creamy sauces and condiments, such as mayonnaise, can add extra fat. Think about whether or not you need to use them, or use smaller amounts or low fat options. WHAT FOODS ARE NOT RECOMMENDED? High fat foods, such as: [employer]. Ice cream. [place] toast. Sweet rolls. Pizza. Cheese bread. Foods covered with batter, butter, creamy sauces, or cheese. Fried foods. Sugary drinks and desserts. Foods that cause gas or bloating   This information is not intended to replace advice given to you by your health care provider. Make sure you  discuss any questions you have with your health care provider.   Document Released: 01/26/2013 Document Reviewed: 01/26/2013 Elsevier Interactive Patient Education [employer]

## 2023-08-12 NOTE — Progress Notes (Signed)
 Patient ID: [name], female   DOB: Jul 04, 1980, 43 y.o.   MRN: [id number] CC: Biliary Colic History of Present Illness [name] is a 43 y.o. female with past medical history significant for MS who presents consultation for biliary colic.  The patient reports that several weeks ago she presented to the emergency department for postprandial right upper quadrant pain.  This was associated with nausea.  She did not have any fevers or chills.  She says that it was severe and sharp in nature.  It abated on its own after presenting to the emergency department.  She did have an ultrasound that showed gallstones.  She also reports that a similar episode happened several months ago which brought her to the ED but at that time she had a CT that did not show any gallstones.  She denies any diarrhea constipation, fevers chills or jaundice..  Past Medical History Past Medical History:  Diagnosis Date   Allergy    Anxiety    Depression    Migraine    MS (multiple sclerosis) (HCC)    Multiple sclerosis (HCC)    Spinal stenosis        Past Surgical History:  Procedure Laterality Date   NO PAST SURGERIES      Allergies  Allergen Reactions   Flexeril  [Cyclobenzaprine ] Other (See Comments)    Drowsy when taking during the day     Current Outpatient Medications  Medication Sig Dispense Refill   albuterol  (VENTOLIN  HFA) 108 (90 Base) MCG/ACT inhaler Inhale 2 puffs into the lungs every 6 (six) hours as needed for wheezing or shortness of breath. 6.7 g 0   amphetamine -dextroamphetamine  (ADDERALL  XR) 30 MG 24 hr capsule TAKE 1 CAPSULE BY MOUTH DAILY 30 capsule 0   baclofen  (LIORESAL ) 10 MG tablet TAKE 1 TABLET BY MOUTH FOUR TIMES DAILY AS NEEDED FOR MUSCLE SPASMS 360 each 2   buPROPion  (WELLBUTRIN  XL) 300 MG 24 hr tablet Take 1 tablet (300 mg total) by mouth daily. 30 tablet 12   Cholecalciferol 25 MCG (1000 UT) tablet Take 5,000 Units by mouth daily.      meclizine   (ANTIVERT ) 25 MG tablet Take 1 tablet (25 mg total) by mouth 3 (three) times daily as needed for dizziness. 30 tablet 0   ocrelizumab 600 mg in sodium chloride  0.9 % 500 mL Inject 600 mg into the vein every 6 (six) months.     oxyCODONE  (ROXICODONE ) 5 MG immediate release tablet Take 1 tablet (5 mg total) by mouth every 8 (eight) hours as needed for severe pain (pain score 7-10). 10 tablet 0   propranolol  (INDERAL ) 10 MG tablet Take 1 tablet (10 mg total) by mouth daily as needed. For severe anxiety attack 30 tablet 12   sertraline  (ZOLOFT ) 100 MG tablet Take 2 tablets (200 mg total) by mouth daily. 60 tablet 12   traZODone  (DESYREL ) 100 MG tablet Take 1 tablet (100 mg total) by mouth at bedtime. 30 tablet 12   No current facility-administered medications for this visit.    Family History Family History  Problem Relation Age of Onset   Multiple myeloma Mother    Multiple sclerosis Mother    Cancer Mother    Asthma Father    Obesity Sister    Anxiety disorder Daughter    Multiple sclerosis Maternal Aunt        Social History Social History   Tobacco Use   Smoking status: Never   Smokeless tobacco: Never  Vaping Use  Vaping status: Never Used  Substance Use Topics   Alcohol use: Not Currently    Alcohol/week: 0.0 standard drinks of alcohol    Comment: occasional/fim   Drug use: No    Works as a Production manager   ROS Full ROS of systems performed and is otherwise negative there than what is stated in the HPI  Physical Exam Blood pressure 125/75, pulse (!) 125, temperature 98.1 F (36.7 C), temperature source Oral, height 5' 3 (1.6 m), weight 235 lb (106.6 kg), SpO2 98%.  Alert and oriented x 3, normal work of breathing on room air, sinus tachycardia, abdomen soft, obese, nontender nondistended, moving all extremities spontaneously and mood and affect appropriate   Ultrasound personally reviewed and consistent with cholelithiasis.  Labs reviewed and consistent with  slightly elevated white count and slightly elevated lipase without elevation in her total bilirubin.  I have personally reviewed the patient's imaging and medical records.    Assessment    43 year old female with biliary colic  Plan    Given that she has gone to the emergency department twice for this I recommended that she undergo robotic assisted cholecystectomy.  We discussed the risk, benefits alternatives of the procedure including risk of infection, bleeding, injury to common bile duct, bile leak, retained stone and need for open procedure.  She understands these risks and wishes to proceed with surgery.    Jayson MALVA Endow 08/12/2023, 3:43 PM

## 2023-08-14 ENCOUNTER — Other Ambulatory Visit: Payer: Self-pay

## 2023-08-14 ENCOUNTER — Encounter
Admission: RE | Admit: 2023-08-14 | Discharge: 2023-08-14 | Disposition: A | Discharge status: Home / Self Care | Source: Ambulatory Visit | Attending: General Surgery | Admitting: General Surgery

## 2023-08-14 HISTORY — DX: Unspecified right bundle-branch block: I45.10

## 2023-08-14 HISTORY — DX: Gastro-esophageal reflux disease without esophagitis: K21.9

## 2023-08-14 HISTORY — DX: Nausea with vomiting, unspecified: R11.2

## 2023-08-14 HISTORY — DX: Foot drop, right foot: M21.371

## 2023-08-14 HISTORY — DX: Calculus of bile duct without cholangitis or cholecystitis without obstruction: K80.50

## 2023-08-14 HISTORY — DX: Nausea with vomiting, unspecified: Z98.890

## 2023-08-14 HISTORY — DX: Pure hypercholesterolemia, unspecified: E78.00

## 2023-08-14 HISTORY — DX: Vitamin D deficiency, unspecified: E55.9

## 2023-08-14 HISTORY — DX: Obesity, unspecified: E66.9

## 2023-08-14 NOTE — Progress Notes (Signed)
 Called [name] to do her anesthesia interview today. [name] states that she is taking Robitussin for cough and congestion. [name] states she does have a productive cough with green sputum and has had some hoarseness. Has been on Amoxicillin  and completed course and states symptoms have remained the same. Denies fever. [name] states she just got back from staying with family in [place]  and there were cigarette smokers and vapers in the house. [name] states she spoke with Dr Marinda about this and he says he is ok to proceed with surgery. I informed [name] that it was ultimately up to anesthesia whether or not she can proceed with surgery, no matter what her surgeon says. [name] verbalized that she understands this and will come in for surgery in the morning and see what anesthesia says

## 2023-08-14 NOTE — Patient Instructions (Addendum)
 Your procedure is scheduled on:08-15-23 Friday Report to the Registration Desk on the 1st floor of the [facility].Then proceed to the 2nd floor Surgery Desk To find out your arrival time, please call [phone] between 1PM - 3PM on:08-14-23 Thursday If your arrival time is 6:00 am, do not arrive before that time as the [facility] entrance doors do not open until 6:00 am.  REMEMBER: Instructions that are not followed completely may result in serious medical risk, up to and including death; or upon the discretion of your surgeon and anesthesiologist your surgery may need to be rescheduled.  Do not eat food OR drink liquids after midnight the night before surgery.  No gum chewing or hard candies.  One week prior to surgery:Stop NOW (08-14-23) Stop Anti-inflammatories (NSAIDS) such as Advil , Aleve, Ibuprofen , Motrin , Naproxen, Naprosyn and Aspirin based products such as Excedrin, Goody's Powder, BC Powder. Stop ANY OVER THE COUNTER supplements until after surgery.  You may however, continue to take Tylenol if needed for pain up until the day of surgery.  Continue taking all of your other prescription medications up until the day of surgery.  ON THE DAY OF SURGERY ONLY TAKE THESE MEDICATIONS WITH SIPS OF WATER: -baclofen  (LIORESAL )  -buPROPion  (WELLBUTRIN  XL)  -sertraline  (ZOLOFT )  -You may take propranolol  (INDERAL ) for anxiety if needed  Bring your Albuterol  Inhaler to the hospital  No Alcohol for 24 hours before or after surgery.  No Smoking including e-cigarettes for 24 hours before surgery.  No chewable tobacco products for at least 6 hours before surgery.  No nicotine patches on the day of surgery.  Do not use any recreational drugs for at least a week (preferably 2 weeks) before your surgery.  Please be advised that the combination of cocaine and anesthesia may have negative outcomes, up to and including death. If you test positive for cocaine, your surgery will be  cancelled.  On the morning of surgery brush your teeth with toothpaste and water, you may rinse your mouth with mouthwash if you wish. Do not swallow any toothpaste or mouthwash.  Do not wear jewelry, make-up, hairpins, clips or nail polish.  For welded (permanent) jewelry: bracelets, anklets, waist bands, etc.  Please have this removed prior to surgery.  If it is not removed, there is a chance that hospital personnel will need to cut it off on the day of surgery.  Do not wear lotions, powders, or perfumes.   Do not shave body hair from the neck down 48 hours before surgery.  Contact lenses, hearing aids and dentures may not be worn into surgery.  Do not bring valuables to the hospital. [facility] is not responsible for any missing/lost belongings or valuables.   Notify your doctor if there is any change in your medical condition (cold, fever, infection).  Wear comfortable clothing (specific to your surgery type) to the hospital.  After surgery, you can help prevent lung complications by doing breathing exercises.  Take deep breaths and cough every 1-2 hours. Your doctor may order a device called an Incentive Spirometer to help you take deep breaths. When coughing or sneezing, hold a pillow firmly against your incision with both hands. This is called "splinting." Doing this helps protect your incision. It also decreases belly discomfort.  If you are being admitted to the hospital overnight, leave your suitcase in the car. After surgery it may be brought to your room.  In case of increased patient census, it may be necessary for you, the  patient, to continue your postoperative care in the Same Day Surgery department.  If you are being discharged the day of surgery, you will not be allowed to drive home. You will need a responsible individual to drive you home and stay with you for 24 hours after surgery.   If you are taking public transportation, you will need to have a responsible  individual with you.  Please call the Pre-admissions Testing Dept. at [phone] if you have any questions about these instructions.  Surgery Visitation Policy:  Patients having surgery or a procedure may have two visitors.  Children under the age of 5 must have an adult with them who is not the patient.   Merchandiser, retail to address health-related social needs:  [email or web address]

## 2023-08-15 ENCOUNTER — Ambulatory Visit: Admitting: Certified Registered Nurse Anesthetist

## 2023-08-15 ENCOUNTER — Encounter
Admission: RE | Disposition: A | Discharge status: Home / Self Care | Payer: Self-pay | Source: Home / Self Care | Attending: General Surgery

## 2023-08-15 ENCOUNTER — Encounter: Payer: Self-pay | Admitting: General Surgery

## 2023-08-15 ENCOUNTER — Other Ambulatory Visit: Payer: Self-pay

## 2023-08-15 ENCOUNTER — Ambulatory Visit
Admission: RE | Admit: 2023-08-15 | Discharge: 2023-08-15 | Disposition: A | Discharge status: Home / Self Care | Attending: General Surgery | Admitting: General Surgery

## 2023-08-15 DIAGNOSIS — Z01818 Encounter for other preprocedural examination: Secondary | ICD-10-CM

## 2023-08-15 DIAGNOSIS — K8050 Calculus of bile duct without cholangitis or cholecystitis without obstruction: Secondary | ICD-10-CM | POA: Diagnosis present

## 2023-08-15 DIAGNOSIS — K219 Gastro-esophageal reflux disease without esophagitis: Secondary | ICD-10-CM | POA: Insufficient documentation

## 2023-08-15 DIAGNOSIS — Z79899 Other long term (current) drug therapy: Secondary | ICD-10-CM | POA: Insufficient documentation

## 2023-08-15 DIAGNOSIS — K8010 Calculus of gallbladder with chronic cholecystitis without obstruction: Secondary | ICD-10-CM | POA: Diagnosis not present

## 2023-08-15 LAB — POCT PREGNANCY, URINE: Preg Test, Ur: NEGATIVE

## 2023-08-15 SURGERY — CHOLECYSTECTOMY, ROBOT-ASSISTED, LAPAROSCOPIC
Anesthesia: General

## 2023-08-15 MED ORDER — DEXMEDETOMIDINE HCL IN NACL 200 MCG/50ML IV SOLN
INTRAVENOUS | Status: DC | PRN
  Administered 2023-08-15: 12 ug via INTRAVENOUS
  Administered 2023-08-15: 8 ug via INTRAVENOUS
  Administered 2023-08-15: 4 ug via INTRAVENOUS
  Administered 2023-08-15 (×2): 8 ug via INTRAVENOUS

## 2023-08-15 MED ORDER — ONDANSETRON HCL 4 MG/2ML IJ SOLN
INTRAMUSCULAR | Status: DC | PRN
  Administered 2023-08-15: 4 mg via INTRAVENOUS

## 2023-08-15 MED ORDER — CHLORHEXIDINE GLUCONATE CLOTH 2 % EX PADS: 6.0000 | MEDICATED_PAD | Freq: Once | CUTANEOUS | Status: DC

## 2023-08-15 MED ORDER — DEXAMETHASONE SODIUM PHOSPHATE 10 MG/ML IJ SOLN
INTRAMUSCULAR | Status: DC | PRN
  Administered 2023-08-15: 10 mg via INTRAVENOUS

## 2023-08-15 MED ORDER — ROCURONIUM BROMIDE 100 MG/10ML IV SOLN
INTRAVENOUS | Status: DC | PRN
  Administered 2023-08-15: 50 mg via INTRAVENOUS
  Administered 2023-08-15: 20 mg via INTRAVENOUS

## 2023-08-15 MED ORDER — CHLORHEXIDINE GLUCONATE 0.12 % MT SOLN
OROMUCOSAL | Status: AC
  Filled 2023-08-15: qty 15

## 2023-08-15 MED ORDER — ESMOLOL HCL 100 MG/10ML IV SOLN
INTRAVENOUS | Status: DC | PRN
  Administered 2023-08-15: 10 mg via INTRAVENOUS

## 2023-08-15 MED ORDER — OXYCODONE HCL 5 MG PO TABS
5.0000 mg | ORAL_TABLET | Freq: Once | ORAL | Status: AC | PRN
  Administered 2023-08-15: 5 mg via ORAL

## 2023-08-15 MED ORDER — LIDOCAINE HCL (CARDIAC) PF 100 MG/5ML IV SOSY
PREFILLED_SYRINGE | INTRAVENOUS | Status: DC | PRN
  Administered 2023-08-15: 100 mg via INTRAVENOUS

## 2023-08-15 MED ORDER — SODIUM CHLORIDE 0.9 % IV SOLN
2.0000 g | INTRAVENOUS | Status: AC
  Administered 2023-08-15: 2 g via INTRAVENOUS

## 2023-08-15 MED ORDER — ALBUTEROL SULFATE HFA 108 (90 BASE) MCG/ACT IN AERS
INHALATION_SPRAY | RESPIRATORY_TRACT | Status: DC | PRN
Start: 2023-08-15 — End: 2023-08-15
  Administered 2023-08-15: 3 via RESPIRATORY_TRACT

## 2023-08-15 MED ORDER — BUPIVACAINE-EPINEPHRINE (PF) 0.5% -1:200000 IJ SOLN
INTRAMUSCULAR | Status: DC | PRN
  Administered 2023-08-15: 10 mL

## 2023-08-15 MED ORDER — ORAL CARE MOUTH RINSE
15.0000 mL | Freq: Once | OROMUCOSAL | Status: AC
Start: 2023-08-15 — End: 2023-08-15

## 2023-08-15 MED ORDER — ACETAMINOPHEN 10 MG/ML IV SOLN
INTRAVENOUS | Status: DC | PRN
  Administered 2023-08-15: 1000 mg via INTRAVENOUS

## 2023-08-15 MED ORDER — BUPIVACAINE LIPOSOME 1.3 % IJ SUSP
INTRAMUSCULAR | Status: DC | PRN
Start: 2023-08-15 — End: 2023-08-15
  Administered 2023-08-15: 10 mL

## 2023-08-15 MED ORDER — MIDAZOLAM HCL 2 MG/2ML IJ SOLN
INTRAMUSCULAR | Status: AC
  Filled 2023-08-15: qty 2

## 2023-08-15 MED ORDER — 0.9 % SODIUM CHLORIDE (POUR BTL) OPTIME
TOPICAL | Status: DC | PRN
  Administered 2023-08-15: 500 mL

## 2023-08-15 MED ORDER — SUGAMMADEX SODIUM 200 MG/2ML IV SOLN
INTRAVENOUS | Status: DC | PRN
  Administered 2023-08-15: 204.2 mg via INTRAVENOUS

## 2023-08-15 MED ORDER — OXYCODONE HCL 5 MG PO TABS
ORAL_TABLET | ORAL | Status: AC
  Filled 2023-08-15: qty 1

## 2023-08-15 MED ORDER — ALBUTEROL SULFATE HFA 108 (90 BASE) MCG/ACT IN AERS
INHALATION_SPRAY | RESPIRATORY_TRACT | Status: AC
  Filled 2023-08-15: qty 6.7

## 2023-08-15 MED ORDER — CEFOTETAN DISODIUM 2 G IJ SOLR
INTRAMUSCULAR | Status: AC
  Filled 2023-08-15: qty 2

## 2023-08-15 MED ORDER — HYDROMORPHONE HCL 1 MG/ML IJ SOLN
INTRAMUSCULAR | Status: AC
Start: 2023-08-15 — End: 2023-08-15
  Filled 2023-08-15: qty 1

## 2023-08-15 MED ORDER — LACTATED RINGERS IV SOLN: INTRAVENOUS | Status: DC

## 2023-08-15 MED ORDER — HYDROMORPHONE HCL 1 MG/ML IJ SOLN
INTRAMUSCULAR | Status: DC | PRN
  Administered 2023-08-15 (×2): .5 mg via INTRAVENOUS

## 2023-08-15 MED ORDER — FENTANYL CITRATE (PF) 100 MCG/2ML IJ SOLN
INTRAMUSCULAR | Status: DC | PRN
  Administered 2023-08-15 (×2): 50 ug via INTRAVENOUS

## 2023-08-15 MED ORDER — FENTANYL CITRATE (PF) 100 MCG/2ML IJ SOLN
INTRAMUSCULAR | Status: AC
  Filled 2023-08-15: qty 2

## 2023-08-15 MED ORDER — SODIUM CHLORIDE 0.9 % IR SOLN
Status: DC | PRN
  Administered 2023-08-15: 1000 mL

## 2023-08-15 MED ORDER — KETAMINE HCL 10 MG/ML IJ SOLN
INTRAMUSCULAR | Status: DC | PRN
Start: 2023-08-15 — End: 2023-08-15
  Administered 2023-08-15: 30 mg via INTRAVENOUS

## 2023-08-15 MED ORDER — GLYCOPYRROLATE 0.2 MG/ML IJ SOLN
INTRAMUSCULAR | Status: AC
  Filled 2023-08-15: qty 1

## 2023-08-15 MED ORDER — CHLORHEXIDINE GLUCONATE CLOTH 2 % EX PADS
6.0000 | MEDICATED_PAD | Freq: Once | CUTANEOUS | Status: AC
  Administered 2023-08-15: 6 via TOPICAL

## 2023-08-15 MED ORDER — OXYCODONE HCL 5 MG/5ML PO SOLN: 5.0000 mg | Freq: Once | ORAL | Status: AC | PRN

## 2023-08-15 MED ORDER — PROPOFOL 10 MG/ML IV BOLUS
INTRAVENOUS | Status: DC | PRN
  Administered 2023-08-15: 170 mg via INTRAVENOUS

## 2023-08-15 MED ORDER — INDOCYANINE GREEN 25 MG IV SOLR
INTRAVENOUS | Status: AC
  Filled 2023-08-15: qty 10

## 2023-08-15 MED ORDER — OXYCODONE HCL 5 MG PO TABS: 5.0000 mg | ORAL_TABLET | Freq: Four times a day (QID) | ORAL | 0 refills | Status: DC | PRN

## 2023-08-15 MED ORDER — BUPIVACAINE-EPINEPHRINE (PF) 0.5% -1:200000 IJ SOLN
INTRAMUSCULAR | Status: AC
  Filled 2023-08-15: qty 10

## 2023-08-15 MED ORDER — ACETAMINOPHEN 10 MG/ML IV SOLN
INTRAVENOUS | Status: AC
  Filled 2023-08-15: qty 100

## 2023-08-15 MED ORDER — MIDAZOLAM HCL 2 MG/2ML IJ SOLN
INTRAMUSCULAR | Status: DC | PRN
  Administered 2023-08-15: 2 mg via INTRAVENOUS

## 2023-08-15 MED ORDER — KETAMINE HCL 50 MG/5ML IJ SOSY
PREFILLED_SYRINGE | INTRAMUSCULAR | Status: AC
  Filled 2023-08-15: qty 5

## 2023-08-15 MED ORDER — CHLORHEXIDINE GLUCONATE 0.12 % MT SOLN
15.0000 mL | Freq: Once | OROMUCOSAL | Status: AC
Start: 2023-08-15 — End: 2023-08-15
  Administered 2023-08-15: 15 mL via OROMUCOSAL

## 2023-08-15 MED ORDER — FENTANYL CITRATE (PF) 100 MCG/2ML IJ SOLN: 25.0000 ug | INTRAMUSCULAR | Status: DC | PRN

## 2023-08-15 MED ORDER — BUPIVACAINE LIPOSOME 1.3 % IJ SUSP
INTRAMUSCULAR | Status: AC
  Filled 2023-08-15: qty 10

## 2023-08-15 MED ORDER — INDOCYANINE GREEN 25 MG IV SOLR
1.2500 mg | Freq: Once | INTRAVENOUS | Status: AC
  Administered 2023-08-15: 1.25 mg via INTRAVENOUS

## 2023-08-15 MED ORDER — DEXMEDETOMIDINE HCL IN NACL 80 MCG/20ML IV SOLN
INTRAVENOUS | Status: AC
  Filled 2023-08-15: qty 20

## 2023-08-15 SURGICAL SUPPLY — 37 items
BAG PRESSURE INF REUSE 1000 (BAG) IMPLANT
CANNULA REDUCER 12-8 DVNC XI (CANNULA) ×1 IMPLANT
CAUTERY HOOK MNPLR 1.6 DVNC XI (INSTRUMENTS) ×1 IMPLANT
CLIP LIGATING HEMO O LOK GREEN (MISCELLANEOUS) ×1 IMPLANT
DERMABOND ADVANCED .7 DNX12 (GAUZE/BANDAGES/DRESSINGS) ×1 IMPLANT
DRAPE ARM DVNC X/XI (DISPOSABLE) ×4 IMPLANT
DRAPE COLUMN DVNC XI (DISPOSABLE) ×1 IMPLANT
ELECTRODE REM PT RTRN 9FT ADLT (ELECTROSURGICAL) ×1 IMPLANT
FORCEPS BPLR 8 MD DVNC XI (FORCEP) IMPLANT
FORCEPS BPLR R/ABLATION 8 DVNC (INSTRUMENTS) ×1 IMPLANT
FORCEPS PROGRASP DVNC XI (FORCEP) ×1 IMPLANT
GLOVE BIOGEL PI IND STRL 7.5 (GLOVE) ×2 IMPLANT
GLOVE SURG SYN 7.0 (GLOVE) ×2 IMPLANT
GLOVE SURG SYN 7.0 PF PI (GLOVE) ×2 IMPLANT
GOWN STRL REUS W/ TWL LRG LVL3 (GOWN DISPOSABLE) ×3 IMPLANT
GRASPER SUT TROCAR 14GX15 (MISCELLANEOUS) ×1 IMPLANT
IRRIGATOR SUCT 8 DISP DVNC XI (IRRIGATION / IRRIGATOR) IMPLANT
IV NS 1000ML BAXH (IV SOLUTION) IMPLANT
KIT PINK PAD W/HEAD ARM REST (MISCELLANEOUS) ×1 IMPLANT
LABEL OR SOLS (LABEL) ×1 IMPLANT
MANIFOLD NEPTUNE II (INSTRUMENTS) ×1 IMPLANT
NDL HYPO 22X1.5 SAFETY MO (MISCELLANEOUS) ×1 IMPLANT
NDL INSUFFLATION 14GA 120MM (NEEDLE) ×1 IMPLANT
NEEDLE HYPO 22X1.5 SAFETY MO (MISCELLANEOUS) ×1 IMPLANT
NEEDLE INSUFFLATION 14GA 120MM (NEEDLE) ×1 IMPLANT
NS IRRIG 500ML POUR BTL (IV SOLUTION) ×1 IMPLANT
OBTURATOR OPTICALSTD 8 DVNC (TROCAR) ×1 IMPLANT
PACK LAP CHOLECYSTECTOMY (MISCELLANEOUS) ×1 IMPLANT
SEAL UNIV 5-12 XI (MISCELLANEOUS) ×4 IMPLANT
SET TUBE SMOKE EVAC HIGH FLOW (TUBING) ×1 IMPLANT
SOLUTION ELECTROSURG ANTI STCK (MISCELLANEOUS) ×1 IMPLANT
SPIKE FLUID TRANSFER (MISCELLANEOUS) ×2 IMPLANT
SPONGE T-LAP 4X18 ~~LOC~~+RFID (SPONGE) IMPLANT
SUT VICRYL 0 UR6 27IN ABS (SUTURE) ×1 IMPLANT
SUTURE MNCRL 4-0 27XMF (SUTURE) ×1 IMPLANT
SYSTEM BAG RETRIEVAL 10MM (BASKET) ×1 IMPLANT
WATER STERILE IRR 500ML POUR (IV SOLUTION) ×1 IMPLANT

## 2023-08-15 NOTE — Anesthesia Postprocedure Evaluation (Signed)
 Anesthesia Post Note  Patient: [name]  Procedure(s) Performed: CHOLECYSTECTOMY, ROBOT-ASSISTED, LAPAROSCOPIC  Patient location during evaluation: PACU Anesthesia Type: General Level of consciousness: awake and alert Pain management: pain level controlled Vital Signs Assessment: post-procedure vital signs reviewed and stable Respiratory status: spontaneous breathing, nonlabored ventilation and respiratory function stable Cardiovascular status: blood pressure returned to baseline and stable Postop Assessment: no apparent nausea or vomiting Anesthetic complications: no   No notable events documented.   Last Vitals:  Vitals:   08/15/23 1215 08/15/23 1247  BP: 114/68 (!) 132/92  Pulse: 97 100  Resp: 10 16  Temp:  (!) 36.4 C  SpO2: 94% 97%    Last Pain:  Vitals:   08/15/23 1247  TempSrc: Temporal  PainSc: 6                  Fairy POUR Jurnie Garritano

## 2023-08-15 NOTE — Transfer of Care (Signed)
 Immediate Anesthesia Transfer of Care Note  Patient: [name]  Procedure(s) Performed: CHOLECYSTECTOMY, ROBOT-ASSISTED, LAPAROSCOPIC  Patient Location: PACU  Anesthesia Type:General  Level of Consciousness: awake and alert   Airway & Oxygen Therapy: Patient spontanesouly breathing  Post-op Assessment: Report given to RN and Post -op Vital signs reviewed and stable  Post vital signs: Reviewed and stable  Last Vitals:  Vitals Value Taken Time  BP 111/70 08/15/23 11:15  Temp    Pulse 95 08/15/23 11:19  Resp 17 08/15/23 11:22  SpO2 93 % 08/15/23 11:19  Vitals shown include unfiled device data.  Last Pain:  Vitals:   08/15/23 0832  TempSrc: Temporal  PainSc: 0-No pain         Complications: No notable events documented.

## 2023-08-15 NOTE — Anesthesia Preprocedure Evaluation (Signed)
 Anesthesia Evaluation  Patient identified by MRN, date of birth, ID band Patient awake    Reviewed: Allergy & Precautions, NPO status , Patient's Chart, lab work & pertinent test results  History of Anesthesia Complications (+) PONV and history of anesthetic complications  Airway Mallampati: III  TM Distance: >3 FB Neck ROM: full    Dental  (+) Chipped   Pulmonary neg pulmonary ROS, neg shortness of breath   Pulmonary exam normal        Cardiovascular Exercise Tolerance: Good (-) Past MI Normal cardiovascular exam+ dysrhythmias      Neuro/Psych  Headaches PSYCHIATRIC DISORDERS         GI/Hepatic Neg liver ROS,GERD  Controlled,,  Endo/Other  negative endocrine ROS    Renal/GU      Musculoskeletal   Abdominal   Peds  Hematology negative hematology ROS (+)   Anesthesia Other Findings Past Medical History: No date: Allergy No date: Anxiety No date: Biliary colic No date: Depression No date: Elevated LDL cholesterol level No date: GERD (gastroesophageal reflux disease) No date: Migraine No date: MS (multiple sclerosis) (HCC) No date: Obesity No date: PONV (postoperative nausea and vomiting) No date: RBBB (right bundle branch block) No date: Right foot drop No date: Spinal stenosis No date: Vitamin D  deficiency  Past Surgical History: No date: WISDOM TOOTH EXTRACTION  BMI    Body Mass Index: 39.86 kg/m      Reproductive/Obstetrics negative OB ROS                              Anesthesia Physical Anesthesia Plan  ASA: 2  Anesthesia Plan: General ETT   Post-op Pain Management:    Induction: Intravenous  PONV Risk Score and Plan: Ondansetron , Dexamethasone , Midazolam  and Treatment may vary due to age or medical condition  Airway Management Planned: Oral ETT  Additional Equipment:   Intra-op Plan:   Post-operative Plan: Extubation in OR  Informed Consent: I have  reviewed the patients History and Physical, chart, labs and discussed the procedure including the risks, benefits and alternatives for the proposed anesthesia with the patient or authorized representative who has indicated his/her understanding and acceptance.     Dental Advisory Given  Plan Discussed with: Anesthesiologist, CRNA and Surgeon  Anesthesia Plan Comments: (Patient consented for risks of anesthesia including but not limited to:  - adverse reactions to medications - damage to eyes, teeth, lips or other oral mucosa - nerve damage due to positioning  - sore throat or hoarseness - Damage to heart, brain, nerves, lungs, other parts of body or loss of life  Patient voiced understanding and assent.)        Anesthesia Quick Evaluation

## 2023-08-15 NOTE — Op Note (Signed)
 Robotic assisted laparoscopic Cholecystectomy  Pre-operative Diagnosis: Biliary Colic  Post-operative Diagnosis: Same  Procedure:  Robotic assisted laparoscopic Cholecystectomy  Surgeon: Jayson Endow, MD  Anesthesia: Gen. with endotracheal tube  Findings: Mildly inflamed gallbladder with omental adhesions to GB wall, intrahepatic GB Critical view of safety obtained   Estimated Blood Loss: 15cc       Specimens: Gallbladder           Complications: none   Procedure Details  The patient was seen again in the Holding Room. The benefits, complications, treatment options, and expected outcomes were discussed with the patient. The risks of bleeding, infection, recurrence of symptoms, failure to resolve symptoms, bile duct damage, bile duct leak, retained common bile duct stone, bowel injury, any of which could require further surgery and/or ERCP, stent, or papillotomy were reviewed with the patient. The likelihood of improving the patient's symptoms with return to their baseline status is good.  The patient and/or family concurred with the proposed plan, giving informed consent.  The patient was taken to Operating Room, identified  and the procedure verified as robotic Cholecystectomy.  A Time Out was held and the above information confirmed.  Prior to the induction of general anesthesia, antibiotic prophylaxis was administered. VTE prophylaxis was in place. General endotracheal anesthesia was then administered and tolerated well. After the induction, the abdomen was prepped with Chloraprep and draped in the sterile fashion. The patient was positioned in the supine position.  A veress needle was inserted into the abdomen using standard drop technique. An 8mm infra-umbilical robotic port was then placed under direct visualization. There was no injury noted at the site of veress needle insertion. Two right sided abdominal 8mm ports followed by an 8mm left abdominal robotic ports were placed  under direct visualization. The left sided abdominal port was then upsized to a 12mm robotic port.  The patient was positioned  in reverse Trendelenburg, robot was brought to the surgical field and docked in the standard fashion.  We made sure all the instrumentation was kept indirect view at all times and that there were no collision between the arms. I scrubbed out and went to the console.  The gallbladder was identified, the fundus grasped and retracted cephalad. Adhesions were lysed bluntly. The infundibulum was grasped and retracted laterally, exposing the peritoneum overlying the triangle of Calot. This was then divided and exposed in a blunt fashion. An extended critical view of the cystic duct and cystic artery was obtained.  The cystic duct was clearly identified and bluntly dissected.   Artery and duct were double clipped and divided. Using ICG cholangiography we visualized the cystic duct. The gallbladder was taken from the gallbladder fossa in a retrograde fashion with the electrocautery.  Hemostasis was achieved with the electrocautery. nspection of the right upper quadrant was performed. No bleeding, bile duct injury or leak, or bowel injury was noted. Robotic instruments and robotic arms were undocked in the standard fashion.  I scrubbed back in.  The gallbladder was removed and placed in an Endocatch bag.   The left lower quadrant fascia was then closed with a 0 vicryl using a suture needle passer. The pre-peritoneal space was then infiltrated with liposomal bupivicaine and marcaine  solution. Pneumoperitoneum was released.  4-0 subcuticular Monocryl was used to close the skin. Dermabond was  applied.  The patient was then extubated and brought to the recovery room in stable condition. Sponge, lap, and needle counts were correct at closure and at the conclusion of the case.  Jayson Endow, M.D. [facility]

## 2023-08-15 NOTE — H&P (Signed)
 No changes to below H and P, proceed with robotic assisted cholecystectomy  CC: Biliary Colic History of Present Illness [name] is a 43 y.o. female with past medical history significant for MS who presents consultation for biliary colic.  The patient reports that several weeks ago she presented to the emergency department for postprandial right upper quadrant pain.  This was associated with nausea.  She did not have any fevers or chills.  She says that it was severe and sharp in nature.  It abated on its own after presenting to the emergency department.  She did have an ultrasound that showed gallstones.  She also reports that a similar episode happened several months ago which brought her to the ED but at that time she had a CT that did not show any gallstones.  She denies any diarrhea constipation, fevers chills or jaundice..   Past Medical History     Past Medical History:  Diagnosis Date   Allergy     Anxiety     Depression     Migraine     MS (multiple sclerosis) (HCC)     Multiple sclerosis (HCC)     Spinal stenosis                   Past Surgical History:  Procedure Laterality Date   NO PAST SURGERIES              Allergies       Allergies  Allergen Reactions   Flexeril  [Cyclobenzaprine ] Other (See Comments)      Drowsy when taking during the day                Current Outpatient Medications  Medication Sig Dispense Refill   albuterol  (VENTOLIN  HFA) 108 (90 Base) MCG/ACT inhaler Inhale 2 puffs into the lungs every 6 (six) hours as needed for wheezing or shortness of breath. 6.7 g 0   amphetamine -dextroamphetamine  (ADDERALL  XR) 30 MG 24 hr capsule TAKE 1 CAPSULE BY MOUTH DAILY 30 capsule 0   baclofen  (LIORESAL ) 10 MG tablet TAKE 1 TABLET BY MOUTH FOUR TIMES DAILY AS NEEDED FOR MUSCLE SPASMS 360 each 2   buPROPion  (WELLBUTRIN  XL) 300 MG 24 hr tablet Take 1 tablet (300 mg total) by mouth daily. 30 tablet 12   Cholecalciferol 25 MCG (1000 UT)  tablet Take 5,000 Units by mouth daily.        meclizine  (ANTIVERT ) 25 MG tablet Take 1 tablet (25 mg total) by mouth 3 (three) times daily as needed for dizziness. 30 tablet 0   ocrelizumab 600 mg in sodium chloride  0.9 % 500 mL Inject 600 mg into the vein every 6 (six) months.       oxyCODONE  (ROXICODONE ) 5 MG immediate release tablet Take 1 tablet (5 mg total) by mouth every 8 (eight) hours as needed for severe pain (pain score 7-10). 10 tablet 0   propranolol  (INDERAL ) 10 MG tablet Take 1 tablet (10 mg total) by mouth daily as needed. For severe anxiety attack 30 tablet 12   sertraline  (ZOLOFT ) 100 MG tablet Take 2 tablets (200 mg total) by mouth daily. 60 tablet 12   traZODone  (DESYREL ) 100 MG tablet Take 1 tablet (100 mg total) by mouth at bedtime. 30 tablet 12      No current facility-administered medications for this visit.        Family History      Family History  Problem Relation Age of Onset   Multiple myeloma  Mother     Multiple sclerosis Mother     Cancer Mother     Asthma Father     Obesity Sister     Anxiety disorder Daughter     Multiple sclerosis Maternal Aunt              Social History Social History  Social History         Tobacco Use   Smoking status: Never   Smokeless tobacco: Never  Vaping Use   Vaping status: Never Used  Substance Use Topics   Alcohol use: Not Currently      Alcohol/week: 0.0 standard drinks of alcohol      Comment: occasional/fim   Drug use: No      Works as a Production manager     ROS Full ROS of systems performed and is otherwise negative there than what is stated in the HPI   Physical Exam Blood pressure 125/75, pulse (!) 125, temperature 98.1 F (36.7 C), temperature source Oral, height 5' 3 (1.6 m), weight 235 lb (106.6 kg), SpO2 98%.   Alert and oriented x 3, normal work of breathing on room air, sinus tachycardia, abdomen soft, obese, nontender nondistended, moving all extremities spontaneously and mood and  affect appropriate     Ultrasound personally reviewed and consistent with cholelithiasis.  Labs reviewed and consistent with slightly elevated white count and slightly elevated lipase without elevation in her total bilirubin.   I have personally reviewed the patient's imaging and medical records.     Assessment Assessment 43 year old female with biliary colic   Plan Plan Given that she has gone to the emergency department twice for this I recommended that she undergo robotic assisted cholecystectomy.  We discussed the risk, benefits alternatives of the procedure including risk of infection, bleeding, injury to common bile duct, bile leak, retained stone and need for open procedure.  She understands these risks and wishes to proceed with surgery.       [name] 08/12/2023, 3:43 PM

## 2023-08-15 NOTE — Anesthesia Procedure Notes (Signed)
 Procedure Name: Intubation Date/Time: 08/15/2023 9:44 AM  Performed by: Duwayne Craven, CRNAPre-anesthesia Checklist: Patient identified, Patient being monitored, Timeout performed, Emergency Drugs available and Suction available Patient Re-evaluated:Patient Re-evaluated prior to induction Oxygen Delivery Method: Circle system utilized Preoxygenation: Pre-oxygenation with 100% oxygen Induction Type: IV induction Ventilation: Mask ventilation without difficulty and Oral airway inserted - appropriate to patient size Laryngoscope Size: 3 and McGrath Grade View: Grade II Tube type: Oral Tube size: 6.5 mm Number of attempts: 1 Airway Equipment and Method: Stylet and Video-laryngoscopy Placement Confirmation: ETT inserted through vocal cords under direct vision, positive ETCO2 and breath sounds checked- equal and bilateral Secured at: 22 cm Tube secured with: Tape Dental Injury: Teeth and Oropharynx as per pre-operative assessment

## 2023-08-18 LAB — SURGICAL PATHOLOGY

## 2023-08-19 ENCOUNTER — Encounter: Payer: Self-pay | Admitting: Nurse Practitioner

## 2023-08-19 ENCOUNTER — Encounter: Payer: Self-pay | Admitting: General Surgery

## 2023-09-11 ENCOUNTER — Encounter: Payer: Self-pay | Admitting: Neurology

## 2023-09-12 MED ORDER — AMPHETAMINE-DEXTROAMPHET ER 30 MG PO CP24: 30.0000 mg | ORAL_CAPSULE | Freq: Every day | ORAL | 0 refills | Status: DC

## 2023-09-12 NOTE — Telephone Encounter (Signed)
 Requested Prescriptions   Pending Prescriptions Disp Refills   amphetamine -dextroamphetamine  (ADDERALL  XR) 30 MG 24 hr capsule 30 capsule 0    Sig: Take 1 capsule (30 mg total) by mouth daily.   Last seen 07/23/23 Next appt 02/24/24 Dispenses   Dispensed Days Supply Quantity Provider Pharmacy  AMPHET/DEXTR CAP 30MG  ER 08/06/2023 30 30 capsule Sater, Charlie LABOR, MD [facility] - ...  AMPHET/DEXTR CAP 30MG  ER 07/09/2023 30 30 capsule Sater, Charlie LABOR, MD [facility] - ...  AMPHET/DEXTR CAP 30MG  ER 06/04/2023 30 30 capsule Sater, Charlie LABOR, MD [facility] - ...  AMPHET/DEXTR CAP 30MG  ER 05/05/2023 30 30 capsule Sater, Charlie LABOR, MD [facility] - [facility]

## 2023-09-16 ENCOUNTER — Other Ambulatory Visit: Payer: Self-pay | Admitting: Neurology

## 2023-09-16 NOTE — Telephone Encounter (Signed)
 Last seen on 07/23/23 Follow up scheduled on 02/24/24

## 2023-10-01 ENCOUNTER — Ambulatory Visit (INDEPENDENT_AMBULATORY_CARE_PROVIDER_SITE_OTHER): Admitting: Neurology

## 2023-10-01 ENCOUNTER — Other Ambulatory Visit: Payer: Self-pay | Admitting: Neurology

## 2023-10-01 ENCOUNTER — Encounter: Payer: Self-pay | Admitting: Neurology

## 2023-10-01 DIAGNOSIS — Z79899 Other long term (current) drug therapy: Secondary | ICD-10-CM

## 2023-10-01 DIAGNOSIS — R269 Unspecified abnormalities of gait and mobility: Secondary | ICD-10-CM | POA: Diagnosis not present

## 2023-10-01 DIAGNOSIS — G35 Multiple sclerosis: Secondary | ICD-10-CM

## 2023-10-01 DIAGNOSIS — G43009 Migraine without aura, not intractable, without status migrainosus: Secondary | ICD-10-CM | POA: Diagnosis not present

## 2023-10-01 DIAGNOSIS — F418 Other specified anxiety disorders: Secondary | ICD-10-CM

## 2023-10-01 DIAGNOSIS — T8090XA Unspecified complication following infusion and therapeutic injection, initial encounter: Secondary | ICD-10-CM

## 2023-10-01 NOTE — Progress Notes (Addendum)
 [facility]  PATIENT: [name] DOB: 1980/12/04  REFERRING DOCTOR OR PCP:  Dr. Jannett Fairly  [facility]) SOURCE: Patient, a couple MRI reports and images on PACS. Lab reports, notes from Dr. Loreli  _________________________________   HISTORICAL  CHIEF COMPLAINT:  Chief Complaint  Patient presents with   Multiple Sclerosis    HISTORY OF PRESENT ILLNESS:  [name] is a 43 y.o. woman with relapsing remitting multiple sclerosis.     Update 10/01/2023: Today, she had her Ocrevus infusion.  During the infusion she had worsening scratchy sensation in her throat and face.  Blood pressure and pulse remained stable.  The infusion needed to be slow down to about 5 hours and symptoms persisted requiring an additional dose of Benadryl .  She has had more difficulties with her infusions due to infusion reactions and has needed to have the infusions slow down and additional Benadryl .  The reaction today was her worst 1.  Her MS has done extremely well while on Ocrevus, however.  Specifically she has had no exacerbations or new lesions on MRI.  I discussed some options with her.  MRI of the brain Jun 25 2022 had shown no new lesions.  She has reduced balance.   She has some some right leg weakness and spasticity.   She stumbles some and had one fall trippig on her dragging .  She has a right foot drop. She has never tried an AFO.  She also takes baclofen  (10 mg in am and 30 mg at night)  for spasticity with benefit.       Mood is doing well on Zoloft  200 mg and Wellbutrin  300 mg/d (just increased). .  She also sees [employer].  She notes mild cognitive issues - especially more difficulty with verbal processing.   She  has some word finding errors and word substitution.     She plays piano and guitar and has some errors when she reads music.  She changed job to a Theatre manager.     Adderall  XR in the morning with additional IR many  but not all afternoons.    She has insomnia helped by trazodone  but she has not taken recently --- she notes insomnia is better if she takes trazodone .  PSG showed snoring but not significant OSA.  She had sparse N3 (4.9%)  Migraine headaches have been well on topiramate .   She had Covid last year and again last month. The second time was mild while the first time wiped her out x weeks.  She also has sinus issues and will be seeing ENT.    MS history:   In late June or early July 2011, she had the onset of numbness that went up to her belly button. She presented to the emergency room. An MRI of the brain showed 1 periventricular focus with maybe a second subtle periventricular focus. An MRI of the cervical spine showed an enhancing lesion adjacent to C3-C4. The diagnosis of possible MS was made but the evidence was not enough to begin a disease modifying therapy. In 2015, she had the onset of similar symptoms and also had decreased ability to use the left hand. An MRI of the brain at that time showed several new lesions not present in 2011, including a focus in the right middle cerebellar peduncle additionally, she had a lumbar puncture consistent with MS. Because of her symptoms, MRI changes and lumbar puncture results, she was diagnosed with clinical definite relapsing  remitting MS and was started on Tecfidera. She has continued on Tecfidera. Over the past few months she has noted more difficulty with focus and concentration.     She also has had some change in vision more recently. Because of these newer symptoms, and MRI was repeated 04/27/2015. The MRI of the cervical spine showed no new lesions. though the MRI of the brain showed 2  lesions not present in 2015, one in the left juxtacortical white matter and one in the left deep white matter.  She had a small exacerbation in August 2017 and received IV SOlu-Medrol .   MRI cervical spine 2017 an MRI of the brain show two spinal plaques, a definite rightt  middle cerebellar peduncle plaque and a possible right pontine plaque and several foci in the hemispheres  MRI 01/2016 showed no new lesions.  She is JCV Ab positive.  She had an exacerbation in 2017 and early 2018 switch to ocrelizumab with her first doses in May 2018 (was on Gilenya ).   She had another small exacerbation in July (right leg stiff/weak) and she received 3 days of IV site.  She went from using a cane to a walker for balance.  She tolerates the ocrelizumab well.     FH:  Her mom has MS and her MS stabilized after a bone marrow transplant for cancer.  MRI images MRI of the brain 09/24/2017 showed T2 hyperintense foci in the right middle cerebellar peduncle and hemispheres.  There were no changes compared to 09/08/2017.  MRI of the cervical spine 09/26/2017 showed foci within the spinal cord adjacent to C4 and C6-C7.  They were both present on an MRI from 04/04/2016.  There is mild spinal stenosis at C5-C6 due to disc protrusion.  MRI of the brain 09/06/2020 showed  a focus within the left middle cerebellar peduncle and a couple small foci in the periventricular white matter.  None of these enhance or appear to be acute.  Compared to the MRI dated 09/24/2017, there are no new lesions.  These are consistent with chronic demyelinating plaque associated with multiple sclerosis   REVIEW OF SYSTEMS: Constitutional: No fevers, chills, sweats, or change in appetite.  She has fatigue and insomnia. Eyes: No visual changes, double vision, eye pain Ear, nose and throat: No hearing loss, ear pain, nasal congestion, sore throat Cardiovascular: No chest pain, palpitations Respiratory:  No shortness of breath at rest or with exertion.   No wheezes GastrointestinaI: No nausea, vomiting, diarrhea, abdominal pain, fecal incontinence Genitourinary:    She notes frequency, some hesitancy at times and nocturia. Musculoskeletal:  No neck pain, back pain Integumentary: No rash, pruritus, skin  lesions Neurological: as above Psychiatric: as above Endocrine: No palpitations, diaphoresis, change in appetite, change in weigh or increased thirst Hematologic/Lymphatic:  No anemia, purpura, petechiae. Allergic/Immunologic: No itchy/runny eyes, nasal congestion, recent allergic reactions, rashes  ALLERGIES: Allergies  Allergen Reactions   Flexeril  [Cyclobenzaprine ] Other (See Comments)    Drowsy when taking during the day     HOME MEDICATIONS:  Current Outpatient Medications:    acetaminophen  (TYLENOL ) 500 MG tablet, Take 1,000 mg by mouth every 6 (six) hours as needed for moderate pain (pain score 4-6)., Disp: , Rfl:    albuterol  (VENTOLIN  HFA) 108 (90 Base) MCG/ACT inhaler, Inhale 2 puffs into the lungs every 6 (six) hours as needed for wheezing or shortness of breath., Disp: 6.7 g, Rfl: 0   amphetamine -dextroamphetamine  (ADDERALL  XR) 30 MG 24 hr capsule, Take 1 capsule (30  mg total) by mouth daily., Disp: 30 capsule, Rfl: 0   baclofen  (LIORESAL ) 10 MG tablet, Take 1-3 tablets (10-30 mg total) by mouth See admin instructions. Take 10 mg in the morning and 30 mg at bedtime, Disp: 360 tablet, Rfl: 1   buPROPion  (WELLBUTRIN  XL) 300 MG 24 hr tablet, Take 1 tablet (300 mg total) by mouth daily. (Patient taking differently: Take 300 mg by mouth every morning.), Disp: 30 tablet, Rfl: 12   Cholecalciferol 125 MCG (5000 UT) TABS, Take 5,000 Units by mouth daily., Disp: , Rfl:    fluticasone  (FLONASE ) 50 MCG/ACT nasal spray, Place 1 spray into both nostrils daily as needed for allergies or rhinitis., Disp: , Rfl:    guaifenesin (ROBITUSSIN) 100 MG/5ML syrup, Take 200 mg by mouth 3 (three) times daily as needed for cough., Disp: , Rfl:    meclizine  (ANTIVERT ) 25 MG tablet, Take 1 tablet (25 mg total) by mouth 3 (three) times daily as needed for dizziness., Disp: 30 tablet, Rfl: 0   ocrelizumab 600 mg in sodium chloride  0.9 % 500 mL, Inject 600 mg into the vein every 6 (six) months., Disp: , Rfl:     oxyCODONE  (OXY IR/ROXICODONE ) 5 MG immediate release tablet, Take 1 tablet (5 mg total) by mouth every 6 (six) hours as needed for severe pain (pain score 7-10)., Disp: 15 tablet, Rfl: 0   propranolol  (INDERAL ) 10 MG tablet, Take 1 tablet (10 mg total) by mouth daily as needed. For severe anxiety attack, Disp: 30 tablet, Rfl: 12   sertraline  (ZOLOFT ) 100 MG tablet, Take 2 tablets (200 mg total) by mouth daily. (Patient taking differently: Take 200 mg by mouth every morning.), Disp: 60 tablet, Rfl: 12   traZODone  (DESYREL ) 100 MG tablet, Take 1 tablet (100 mg total) by mouth at bedtime., Disp: 30 tablet, Rfl: 12  PAST MEDICAL HISTORY: Past Medical History:  Diagnosis Date   Allergy    Anxiety    Biliary colic    Depression    Elevated LDL cholesterol level    GERD (gastroesophageal reflux disease)    Migraine    MS (multiple sclerosis) (HCC)    Obesity    PONV (postoperative nausea and vomiting)    RBBB (right bundle branch block)    Right foot drop    Spinal stenosis    Vitamin D  deficiency     PAST SURGICAL HISTORY: Past Surgical History:  Procedure Laterality Date   WISDOM TOOTH EXTRACTION      FAMILY HISTORY: Family History  Problem Relation Age of Onset   Multiple myeloma Mother    Multiple sclerosis Mother    Cancer Mother    Asthma Father    Obesity Sister    Anxiety disorder Daughter    Multiple sclerosis Maternal Aunt     SOCIAL HISTORY:  Social History   Socioeconomic History   Marital status: Married    Spouse name: Corporate treasurer   Number of children: 3   Years of education: Not on file   Highest education level: Master's degree (e.g., MA, MS, MEng, MEd, MSW, MBA)  Occupational History   Not on file  Tobacco Use   Smoking status: Never   Smokeless tobacco: Never  Vaping Use   Vaping status: Never Used  Substance and Sexual Activity   Alcohol use: Yes    Comment: rare wine   Drug use: No   Sexual activity: Yes    Birth control/protection:  I.U.D.  Other Topics Concern   Not on  file  Social History Narrative   Not on file   Social Drivers of Health   Financial Resource Strain: High Risk (02/04/2023)   Overall Financial Resource Strain (CARDIA)    Difficulty of Paying Living Expenses: Hard  Food Insecurity: Food Insecurity Present (02/04/2023)   Hunger Vital Sign    Worried About Running Out of Food in the Last Year: Sometimes true    Ran Out of Food in the Last Year: Sometimes true  Transportation Needs: No Transportation Needs (02/04/2023)   PRAPARE - Administrator, Civil Service (Medical): No    Lack of Transportation (Non-Medical): No  Physical Activity: Inactive (02/04/2023)   Exercise Vital Sign    Days of Exercise per Week: 0 days    Minutes of Exercise per Session: 0 min  Stress: No Stress Concern Present (02/04/2023)   [employer] of Occupational Health - Occupational Stress Questionnaire    Feeling of Stress : Only a little  Social Connections: Socially Integrated (02/04/2023)   Social Connection and Isolation Panel    Frequency of Communication with Friends and Family: More than three times a week    Frequency of Social Gatherings with Friends and Family: Twice a week    Attends Religious Services: More than 4 times per year    Active Member of [employer] or Organizations: Yes    Attends Engineer, structural: More than 4 times per year    Marital Status: Married  Catering manager Violence: Not At Risk (12/18/2022)   Humiliation, Afraid, Rape, and Kick questionnaire    Fear of Current or Ex-Partner: No    Emotionally Abused: No    Physically Abused: No    Sexually Abused: No     PHYSICAL EXAM  There were no vitals filed for this visit.   There is no height or weight on file to calculate BMI.   General: The patient is well-developed and well-nourished and in no acute distress    Musculoskeletal:    The neck is nontender with a good range of motion.  Neurologic  Exam  Mental status: The patient is alert and oriented x 3 at the time of the examination. The patient has apparent normal recent and remote memory, with an apparently normal attention span and concentration ability.   Speech is normal.  Cranial nerves: Extraocular movements are full.  Facial strength and sensation is normal.  Trapezius strength is normal.  The tongue is midline, and the patient has symmetric elevation of the soft palate. No obvious hearing deficits are noted.  Motor:  Muscle bulk is normal.   Tone is mildly increased in legs, fairly equally.  Strength is  5 / 5 in all 4 extremities except 4+/5 in the feet.   Sensory: Sensory testing shows reduced vibration in the right leg and allodynia to touch in right leg.    She has normal sensation in the arms.  Coordination: Cerebellar testing shows good finger-nose-finger but reduced heel-to-shin slightly worse on the right than left.     Gait and station: Station is normal.  She has a mild right foot drop and poor tandem gait.  Romberg is negative.  Reflexes: Deep tendon reflexes are symmetric and increased in her legs. She has crossed adductor responses at the knees.       DIAGNOSTIC DATA (LABS, IMAGING, TESTING) - I reviewed patient records, labs, notes, testing and imaging myself where available.  Lab Results  Component Value Date   WBC 11.3 (H) 07/26/2023  HGB 13.6 07/26/2023   HCT 42.0 07/26/2023   MCV 78.2 (L) 07/26/2023   PLT 408 (H) 07/26/2023      Component Value Date/Time   NA 139 07/26/2023 1502   NA 142 12/18/2022 1028   NA 141 07/28/2013 0410   K 3.4 (L) 07/26/2023 1502   K 3.7 07/28/2013 0410   CL 102 07/26/2023 1502   CL 103 07/28/2013 0410   CO2 26 07/26/2023 1502   CO2 29 07/28/2013 0410   GLUCOSE 93 07/26/2023 1502   GLUCOSE 121 (H) 07/28/2013 0410   BUN 10 07/26/2023 1502   BUN 9 12/18/2022 1028   BUN 16 07/28/2013 0410   CREATININE 0.85 07/26/2023 1502   CREATININE 0.66 07/28/2013 0410    CALCIUM 9.0 07/26/2023 1502   CALCIUM 8.6 07/28/2013 0410   PROT 7.3 07/26/2023 1502   PROT 6.9 12/18/2022 1028   PROT 6.7 07/25/2013 0410   ALBUMIN 4.0 07/26/2023 1502   ALBUMIN 4.6 12/18/2022 1028   ALBUMIN 3.3 (L) 07/25/2013 0410   AST 36 07/26/2023 1502   AST 20 07/25/2013 0410   ALT 35 07/26/2023 1502   ALT 18 07/25/2013 0410   ALKPHOS 101 07/26/2023 1502   ALKPHOS 49 07/25/2013 0410   BILITOT 0.7 07/26/2023 1502   BILITOT 0.3 12/18/2022 1028   BILITOT 0.4 07/25/2013 0410   GFRNONAA >60 07/26/2023 1502   GFRNONAA >60 07/28/2013 0410   GFRAA 108 12/06/2019 0955   GFRAA >60 07/28/2013 0410        ASSESSMENT AND PLAN  Relapsing remitting multiple sclerosis (HCC)  Gait disturbance  High risk medication use  Migraine without aura and without status migrainosus, not intractable  Depression with anxiety  Infusion reaction, initial encounter   1.   She has had infusion reactions with her last several Ocrevus infusions.  Therefore, we need to make a change in therapy.  The CD20 mechanism has been associated with stability of her MS.  Therefore, I am going to switch her from Ocrevus to Briumvi in the hope that the different mechanism (antibody dependent cellular cytotoxicity via natural killer cells compared to complement mediated cytotoxicity) will allow her to have the same level of efficacy much better tolerability.  Additionally, because of the infusion reactions her infusions have been very slow at greater than 4 hours and this will rapid infusion.   2.  Continue other medications for mood, insomnia and nocturia.   3.   Stay active and exercise as tolerated. 4.   Continue Adderall  30 mg every morning. 5.   Return in 6 months or sooner if there are new or worsening neurologic symptoms.   Yemariam Magar A. Vear, MD, [facility] 10/01/2023, 9:49 PM Certified in Neurology, Clinical Neurophysiology, Sleep Medicine and Neuroimaging  [facility] [address],  [address] [place], [place] [address] [phone] olll

## 2023-10-04 LAB — QUANTIFERON-TB GOLD PLUS
QuantiFERON Mitogen Value: 0.05 [IU]/mL
QuantiFERON Nil Value: 0.03 [IU]/mL
QuantiFERON TB1 Ag Value: 0.04 [IU]/mL
QuantiFERON TB2 Ag Value: 0.03 [IU]/mL
QuantiFERON-TB Gold Plus: UNDETERMINED — AB

## 2023-10-04 LAB — HEPATITIS B CORE ANTIBODY, TOTAL: Hep B Core Total Ab: NEGATIVE

## 2023-10-04 LAB — HEPATITIS B SURFACE ANTIGEN: Hepatitis B Surface Ag: NEGATIVE

## 2023-10-08 ENCOUNTER — Ambulatory Visit: Payer: Self-pay | Admitting: Neurology

## 2023-10-08 DIAGNOSIS — R7612 Nonspecific reaction to cell mediated immunity measurement of gamma interferon antigen response without active tuberculosis: Secondary | ICD-10-CM

## 2023-10-08 DIAGNOSIS — Z5181 Encounter for therapeutic drug level monitoring: Secondary | ICD-10-CM

## 2023-10-08 DIAGNOSIS — G35 Multiple sclerosis: Secondary | ICD-10-CM

## 2023-10-08 NOTE — Telephone Encounter (Addendum)
 LVM for pt to call back for below Results  ----- Message from Charlie DELENA Crete sent at 10/08/2023  9:50 AM EDT ----- Please let her know that the TB test came back indeterminate.  This is neither positive nor negative.  What we need to do is to recheck the Quantiferon TB test.  I would prefer her to go to a [facility]  facility (can do any of the freestanding [facility] phlebotomy labs) which might enhance processing speed and I will enter the order for her to do that.  Additionally, we need to check a chest x-ray.  I  will go ahead and enter that order as well and she can get this done.  It looks like she lives in [place] so I placed the order for the [facility] [place] in [place] ----- Message ----- From: Interface, [facility] Lab Results In Sent: 10/02/2023   7:38 AM EDT To: Charlie DELENA Crete, MD

## 2023-10-16 NOTE — Telephone Encounter (Signed)
 Called pt. She plans to go today for TB test/chest xray.   She will go to local [facility] for repeat TB test and [facility] [place] located: [address] [place] [address] [place], [place] [address]. Relayed mychart sent to her by [facility] 10/08/23.   Aware we will contact her once results back to discuss next steps.

## 2023-10-17 ENCOUNTER — Ambulatory Visit
Admission: RE | Admit: 2023-10-17 | Discharge: 2023-10-17 | Disposition: A | Discharge status: Home / Self Care | Source: Ambulatory Visit | Attending: Neurology | Admitting: Neurology

## 2023-10-17 DIAGNOSIS — R7612 Nonspecific reaction to cell mediated immunity measurement of gamma interferon antigen response without active tuberculosis: Secondary | ICD-10-CM

## 2023-10-17 DIAGNOSIS — Z796 Encounter for therapeutic drug level monitoring: Secondary | ICD-10-CM

## 2023-10-17 DIAGNOSIS — G35 Multiple sclerosis: Secondary | ICD-10-CM

## 2023-10-18 ENCOUNTER — Other Ambulatory Visit: Payer: Self-pay | Admitting: Nurse Practitioner

## 2023-10-18 ENCOUNTER — Other Ambulatory Visit: Payer: Self-pay | Admitting: Neurology

## 2023-10-18 NOTE — Patient Instructions (Signed)
 Be Involved in Caring For Your Health:  Taking Medications When medications are taken as directed, they can greatly improve your health. But if they are not taken as prescribed, they may not work. In some cases, not taking them correctly can be harmful. To help ensure your treatment remains effective and safe, understand your medications and how to take them. Bring your medications to each visit for review by your provider.  Your lab results, notes, and after visit summary will be available on My Chart. We strongly encourage you to use this feature. If lab results are abnormal the clinic will contact you with the appropriate steps. If the clinic does not contact you assume the results are satisfactory. You can always view your results on My Chart. If you have questions regarding your health or results, please contact the clinic during office hours. You can also ask questions on My Chart.  We at [facility] are grateful that you chose us  to provide your care. We strive to provide evidence-based and compassionate care and are always looking for feedback. If you get a survey from the clinic please complete this so we can hear your opinions.   Earache, Adult An earache, or ear pain, can be caused by many things, including: An infection. Ear wax buildup. Ear pressure. Something in the ear that should not be there (foreign body). A sore throat. Tooth problems. Jaw problems. Treatment of the earache will depend on the cause. If the cause is not clear or cannot be known, you may need to watch your symptoms until your earache goes away or until a cause is found. Follow these instructions at home: Medicines Take or apply over-the-counter and prescription medicines only as told by your health care provider. If you were prescribed antibiotics, use them as told by your health care provider. Do not stop using the antibiotic even if you start to feel better. Do not put anything in your ear other  than medicine that is prescribed by your health care provider. Managing pain     If directed, apply heat to the affected area as often as told by your health care provider. Use the heat source that your health care provider recommends, such as a moist heat pack or a heating pad. Place a towel between your skin and the heat source. Leave the heat on for 20-30 minutes. If your skin turns bright red, remove the heat right away to prevent burns. The risk of burns is higher if you cannot feel pain, heat, or cold. If directed, put ice on the affected area. To do this: Put ice in a plastic bag. Place a towel between your skin and the bag. Leave the ice on for 20 minutes, 2-3 times a day. If your skin turns bright red, remove the ice right away to prevent skin damage. The risk of skin damage is higher if you cannot feel pain, heat, or cold.  General instructions Pay attention to any changes in your symptoms. Try resting in an upright position instead of lying down. This may help to reduce pressure in your ear and relieve pain. Chew gum if it helps to relieve your ear pain. Treat any allergies as told by your health care provider. Drink enough fluid to keep your urine pale yellow. It is up to you to get the results of any tests that were done. Ask your health care provider, or the department that is doing the tests, when your results will be ready. Contact a health  care provider if: Your pain does not improve within 2 days. Your earache gets worse. You have new symptoms. You have a fever. Get help right away if: You have a severe headache. You have a stiff neck. You have trouble swallowing. You have redness or swelling behind your ear. You have fluid or blood coming from your ear. You have hearing loss. You feel dizzy. This information is not intended to replace advice given to you by your health care provider. Make sure you discuss any questions you have with your health care  provider. Document Revised: 06/04/2021 Document Reviewed: 06/04/2021 Elsevier Patient Education  2024 [employer].

## 2023-10-20 ENCOUNTER — Encounter: Payer: Self-pay | Admitting: Nurse Practitioner

## 2023-10-20 ENCOUNTER — Ambulatory Visit: Admitting: Nurse Practitioner

## 2023-10-20 ENCOUNTER — Telehealth: Payer: Self-pay | Admitting: *Deleted

## 2023-10-20 ENCOUNTER — Other Ambulatory Visit: Payer: Self-pay

## 2023-10-20 VITALS — BP 125/88 | HR 85 | Temp 98.4°F | Resp 15 | Ht 62.99 in | Wt 235.0 lb

## 2023-10-20 DIAGNOSIS — Z1211 Encounter for screening for malignant neoplasm of colon: Secondary | ICD-10-CM | POA: Diagnosis not present

## 2023-10-20 DIAGNOSIS — H9202 Otalgia, left ear: Secondary | ICD-10-CM | POA: Diagnosis not present

## 2023-10-20 DIAGNOSIS — Z1231 Encounter for screening mammogram for malignant neoplasm of breast: Secondary | ICD-10-CM | POA: Diagnosis not present

## 2023-10-20 DIAGNOSIS — F0632 Mood disorder due to known physiological condition with major depressive-like episode: Secondary | ICD-10-CM | POA: Diagnosis not present

## 2023-10-20 LAB — QUANTIFERON-TB GOLD PLUS
QuantiFERON Mitogen Value: 6.15 [IU]/mL
QuantiFERON Nil Value: 0.02 [IU]/mL
QuantiFERON TB1 Ag Value: 0.03 [IU]/mL
QuantiFERON TB2 Ag Value: 0.02 [IU]/mL
QuantiFERON-TB Gold Plus: NEGATIVE

## 2023-10-20 NOTE — Telephone Encounter (Signed)
 Requested Prescriptions  Pending Prescriptions Disp Refills   sertraline  (ZOLOFT ) 100 MG tablet [Pharmacy Med Name: SERTRALINE  HCL 100 MG TAB] 180 tablet 0    Sig: Take 2 tablets (200 mg total) by mouth every morning.     Psychiatry:  Antidepressants - SSRI - sertraline  Passed - 10/20/2023  4:10 PM      Passed - AST in normal range and within 360 days    AST  Date Value Ref Range Status  07/26/2023 36 15 - 41 U/L Final   SGOT(AST)  Date Value Ref Range Status  07/25/2013 20 15 - 37 Unit/L Final         Passed - ALT in normal range and within 360 days    ALT  Date Value Ref Range Status  07/26/2023 35 0 - 44 U/L Final   SGPT (ALT)  Date Value Ref Range Status  07/25/2013 18 12 - 78 U/L Final         Passed - Completed PHQ-2 or PHQ-9 in the last 360 days      Passed - Valid encounter within last 6 months    Recent Outpatient Visits           Today 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, Melanie T, NP   2 months ago Acute non-recurrent frontal sinusitis   [place] [facility] [place], [place] T, NP   3 months ago Relapsing remitting multiple sclerosis (HCC)   [place] [facility] [place], Melanie DASEN, NP       Future Appointments             In 1 week [name], MD [facility] [place]   In 2 months [place], Jolene T, NP [place] [facility], [address]

## 2023-10-20 NOTE — Telephone Encounter (Signed)
 Faxed completed/signed Briumvi start form to [facility] Patient Support at [phone]. Received fax confirmation.  Pt stopping Ocrevus and starting on Briumvi. Last Ocrevus infusion: 10/01/23. Gave signed orders to [facility].

## 2023-10-20 NOTE — Assessment & Plan Note (Addendum)
 Chronic.  Denies SI/HI.  Followed by psychiatry in past, currently PCP follows. Referral for psychology.

## 2023-10-20 NOTE — Progress Notes (Signed)
 BP 125/88 (BP Location: Left Arm, Patient Position: Sitting, Cuff Size: Normal)   Pulse 85   Temp 98.4 F (36.9 C) (Oral)   Resp 15   Ht 5' 2.99 (1.6 m)   Wt 235 lb (106.6 kg)   SpO2 99%   BMI 41.64 kg/m    Subjective:    Patient ID: [name], female    DOB: 03-15-1980, 43 y.o.   MRN: [id number]  HPI: [name] is a 43 y.o. female   Chief Complaint  Patient presents with   Ear Fullness    Might have been sick but not certain. Has used OTC drops and water. Ongoing a few weeks. Not normally painful.    Would like referral to therapy for depression/anxiety.  EAR DISCOMFORT Presents today with L ear pain, pressure, and hearing loss that has been going on for a few weeks. States pain is mild and the hearing loss is the most bothersome. Pt denies recent illness but states she recently received an infusion which depletes her b-cells and she often feels unwell after, denies current rhinorrhea, post-nasal drip, fevers, or cough. Pt also would like a referral for a therapist, states she has been feeling run down lately.   Duration: weeks Involved ear(s): left Severity:  mild  Quality:  dull, aching, stabbing, tender, tearing, and throbbing Fever: no Otorrhea: no Upper respiratory infection symptoms: yes Pruritus: no Hearing loss: yes Water immersion no Using Q-tips: no Recurrent otitis media: no Status: worse Treatments attempted: none   Relevant past medical, surgical, family and social history reviewed and updated as indicated. Interim medical history since our last visit reviewed. Allergies and medications reviewed and updated.  Review of Systems  Constitutional:  Negative for chills, fatigue and fever.  HENT:  Positive for ear pain and hearing loss. Negative for ear discharge, postnasal drip, rhinorrhea, sinus pressure, sinus pain and sore throat.   Eyes:  Negative for pain and discharge.  Respiratory:  Negative for cough and shortness of  breath.   Cardiovascular:  Negative for chest pain and palpitations.  Gastrointestinal:  Negative for abdominal pain.  Neurological:  Negative for dizziness and light-headedness.    Per HPI unless specifically indicated above     Objective:    BP 125/88 (BP Location: Left Arm, Patient Position: Sitting, Cuff Size: Normal)   Pulse 85   Temp 98.4 F (36.9 C) (Oral)   Resp 15   Ht 5' 2.99 (1.6 m)   Wt 235 lb (106.6 kg)   SpO2 99%   BMI 41.64 kg/m   Wt Readings from Last 3 Encounters:  10/20/23 235 lb (106.6 kg)  08/15/23 225 lb (102.1 kg)  08/12/23 235 lb (106.6 kg)    Physical Exam Vitals and nursing note reviewed.  Constitutional:      General: She is not in acute distress.    Appearance: She is not ill-appearing, toxic-appearing or diaphoretic.  HENT:     Head: Normocephalic.     Right Ear: External ear normal.     Left Ear: External ear normal. There is impacted cerumen.     Ears:     Comments: Checked ear after irrigation by nurse and able to view TM left side.     Nose: Nose normal. No congestion or rhinorrhea.     Mouth/Throat:     Mouth: Mucous membranes are moist.     Pharynx: No oropharyngeal exudate or posterior oropharyngeal erythema.  Eyes:     General:  Right eye: No discharge.        Left eye: No discharge.     Extraocular Movements: Extraocular movements intact.     Conjunctiva/sclera: Conjunctivae normal.     Pupils: Pupils are equal, round, and reactive to light.  Cardiovascular:     Rate and Rhythm: Normal rate and regular rhythm.     Heart sounds: No murmur heard. Pulmonary:     Effort: Pulmonary effort is normal. No respiratory distress.     Breath sounds: Normal breath sounds. No wheezing, rhonchi or rales.  Abdominal:     General: Abdomen is flat. Bowel sounds are normal.  Musculoskeletal:     Cervical back: Normal range of motion and neck supple.  Skin:    General: Skin is warm and dry.     Capillary Refill: Capillary refill  takes less than 2 seconds.  Neurological:     Mental Status: She is oriented to person, place, and time.  Psychiatric:     Comments: Pt reports feeling down    Results for orders placed or performed in visit on 10/08/23  QuantiFERON-TB Gold Plus   Collection Time: 10/17/23  9:28 AM  Result Value Ref Range   QuantiFERON Incubation Incubation performed.    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  Denies SI/HI.  Followed by psychiatry in past, currently PCP follows. Referral for psychology.       Relevant Orders   Ambulatory referral to Psychology   Other Visit Diagnoses       Otalgia of left ear       Otalgia of the L ear that has been ongoing for the past few weeks. Impacted cerumen. Ear-wash performed in clinic today.     Colon cancer screening       Cologuard ordered   Relevant Orders   Cologuard     Encounter for screening mammogram for malignant neoplasm of breast       Mammogram ordered   Relevant Orders   MM 3D SCREENING MAMMOGRAM BILATERAL BREAST        Follow up plan: Return if symptoms worsen or fail to improve.

## 2023-10-20 NOTE — Telephone Encounter (Signed)
 Pt Last Seen 10/01/23 Upcoming Appointment 02/24/24  Adderall  Last Filled 10/18/23  PLEASE DO NOT REFILL!!!!

## 2023-10-21 NOTE — Progress Notes (Signed)
 Sent mychart to pt 10/20/23. See results note 10/08/23

## 2023-10-23 ENCOUNTER — Encounter (INDEPENDENT_AMBULATORY_CARE_PROVIDER_SITE_OTHER): Payer: Self-pay

## 2023-10-23 NOTE — Telephone Encounter (Signed)
 Called and spoke with pt. Relayed mychart. She verbalized understanding and appreciation.

## 2023-10-27 ENCOUNTER — Encounter (INDEPENDENT_AMBULATORY_CARE_PROVIDER_SITE_OTHER): Payer: Self-pay | Admitting: Internal Medicine

## 2023-10-27 ENCOUNTER — Ambulatory Visit (INDEPENDENT_AMBULATORY_CARE_PROVIDER_SITE_OTHER): Admitting: Internal Medicine

## 2023-10-27 VITALS — BP 112/76 | HR 92 | Temp 98.2°F | Ht 63.0 in | Wt 232.0 lb

## 2023-10-27 DIAGNOSIS — R5383 Other fatigue: Secondary | ICD-10-CM

## 2023-10-27 DIAGNOSIS — T50905A Adverse effect of unspecified drugs, medicaments and biological substances, initial encounter: Secondary | ICD-10-CM | POA: Diagnosis not present

## 2023-10-27 DIAGNOSIS — R718 Other abnormality of red blood cells: Secondary | ICD-10-CM | POA: Diagnosis not present

## 2023-10-27 DIAGNOSIS — E876 Hypokalemia: Secondary | ICD-10-CM

## 2023-10-27 DIAGNOSIS — G35 Multiple sclerosis: Secondary | ICD-10-CM

## 2023-10-27 DIAGNOSIS — Z1331 Encounter for screening for depression: Secondary | ICD-10-CM

## 2023-10-27 DIAGNOSIS — R0602 Shortness of breath: Secondary | ICD-10-CM | POA: Diagnosis not present

## 2023-10-27 DIAGNOSIS — Z6841 Body Mass Index (BMI) 40.0 and over, adult: Secondary | ICD-10-CM

## 2023-10-27 DIAGNOSIS — E66813 Obesity, class 3: Secondary | ICD-10-CM

## 2023-10-27 NOTE — Assessment & Plan Note (Signed)
 Patient notes changes in treatment.  She takes Adderall  for MS related fatigue.  Her MS also affects her endurance and ability to engage in regular physical activity.

## 2023-10-27 NOTE — Progress Notes (Signed)
 [address] [place],  [place], [place] [address]  Office: [phone]  /  Fax: [phone]   Subjective   Initial Visit  [name] (MR# [id number]) is a 43 y.o. female who presents for evaluation and treatment of obesity and related comorbidities. Current BMI is Body mass index is 41.1 kg/m. [name] has been struggling with her weight for many years and has been unsuccessful in either losing weight, maintaining weight loss, or reaching her healthy weight goal.  [name] is currently in the action stage of change and ready to dedicate time achieving and maintaining a healthier weight. [name] is interested in becoming our patient and working on intensive lifestyle modifications including (but not limited to) diet and exercise for weight loss.  Weight history:  Anthropometrics and Bioimpedance Analysis    Body mass index is 40.57 kg/m. Body Fat Mass : 46 % Visceral Fat Mass Rating : 13 Waist to Height Ratio: TBD   Obesity Related Diseases and Complications   Obesity Quality of Life and Psychosocial Complications: Reduced health-related quality of life and Problems with body image   Cardiometabolic: Fatigue   Biomechanical: Low back pain, GERD, and Urinary incontinence    Weight Related History   She was referred by: PCP   When asked what they would like to accomplish? She states: Adopt a healthier eating pattern and lifestyle, Improve energy levels and physical activity, and Improve quality of life   Weight history: Started after 1st  and 2nd child and possibly medication changes   Highest weight: 233   Contributing factors: family history of obesity, disruption of circadian rhythm / sleep disordered breathing, consumption of processed foods, use of obesogenic medications: Beta-blockers, Psychotropic medications, and Steroids, reduced physical activity, chronic skipping of meals, strong orexigenic signaling and/or inadequate inhibitory control , multiple weight  loss attempts in the past, sedentary job, need for convenient foods, and difficulty with meal preparation   Prior weight loss attempts: Weight Watchers and paleo, optavia - felt fatigue   Current or previous pharmacotherapy: Is interested in pharmacotherapy and GLP-1 - was on Wegovy  1 month, lost coverage   Response to medication: Lost weight initially but was unable to sustain weight loss   Current nutrition plan: None   Greatest challenge with dieting: felt burdensome.   Current level of physical activity: Low levels of physical activity at present   Barriers to Exercise: energy and leg weakness from MS  Discussed the use of AI scribe software for clinical note transcription with the patient, who gave verbal consent to proceed.  History of Present Illness [name] is a 43 year old female with relapsing-remitting multiple sclerosis who presents for a new intake appointment focused on weight management.  She has relapsing-remitting multiple sclerosis and receives  infusion every six months with biologic, with the last infusion at the end of August. Fatigue is a significant symptom, managed with Adderall  for daytime sleepiness. She experiences decreased physical activity due to MS-related fatigue and energy levels.  She has a history of obesity and has tried various weight loss methods, including Wegovy  for a month and the paleo diet with some success. She has a sweet tooth and often eats for emotional reasons, including boredom and comfort. She has attempted diets like Weight Watchers and has reduced her intake of sweet tea and soda. Her family history includes obesity, and her daughter is prediabetic.  She has a history of low potassium and elevated platelet counts. She is not currently taking potassium supplements but does  take vitamin D . She believes she is transitioning into perimenopause and has had three pregnancies without complications such as diabetes or  preeclampsia.  She has a history of right bundle branch block, migraines, and insomnia. A sleep study conducted a year or two ago showed no sleep apnea but indicated light sleep. She takes trazodone  for sleep and has experienced difficulty discontinuing it due to insomnia.  She works as a Merchandiser, retail in [employer] and has a busy household with four children, including a 34 year old son, a 11 year old daughter, a 41 year old son, and a 42 year old Museum/gallery conservator. She often skips meals due to her busy schedule and ADHD, which is managed with Adderall .   Nutritional History:  Current nutrition plan: None.  How many times do you eat outside the home: > 7 per week  How often do they skip meals: skips breakfast and skips dinner  What beverages do they drink: sweet tea .   Use of artificial sweetners : Yes  Food intolerances or dislikes: none.  Food triggers: Boredom, Seeking reward, and To help comfort self.  Food cravings: Sugary and Starches / Carbohydrates  Do they struggle with excessive hunger or portion control : Yes    Physical Activity:  Current level of physical activity: Low levels of physical activity at present  Barriers to Exercise: energy   Past medical history includes:   Past Medical History:  Diagnosis Date   Allergy    Anxiety    Biliary colic    Depression    Elevated LDL cholesterol level    GERD (gastroesophageal reflux disease)    Migraine    MS (multiple sclerosis) (HCC)    Obesity    PONV (postoperative nausea and vomiting)    RBBB (right bundle branch block)    Right foot drop    Spinal stenosis    Vitamin D  deficiency      Objective   BP 112/76   Pulse 92   Temp 98.2 F (36.8 C)   Ht 5' 3 (1.6 m)   Wt 232 lb (105.2 kg)   SpO2 98%   BMI 41.10 kg/m  She was weighed on the bioimpedance scale: Body mass index is 41.1 kg/m.    Anthropometrics:  Vitals Temp: 98.2 F (36.8 C) BP: 112/76 Pulse  Rate: 92 SpO2: 98 %   Anthropometric Measurements Height: 5' 3 (1.6 m) Weight: 232 lb (105.2 kg) BMI (Calculated): 41.11 Starting Weight: 232 lb Peak Weight: 233 lb   No data recorded Other Clinical Data Fasting: yes Labs: yes Today's Visit #: 10/27/2023    Physical Exam:  General: She is overweight, cooperative, alert, well developed, and in no acute distress. PSYCH: Has normal mood, affect and thought process.   HEENT: EOMI, sclerae are anicteric. Lungs: Normal breathing effort, no conversational dyspnea. Extremities: No edema.  Neurologic: No gross sensory or motor deficits. No tremors or fasciculations noted.    Diagnostic Data Reviewed   Indirect Calorimeter completed today shows a VO2 of 261 and a REE of 1800.  Her calculated basal metabolic rate is 8246 thus her resting energy expenditure same as calculated.  Depression Screen  Genavie's PHQ-9 score was: 11.     10/27/2023    8:00 AM  Depression screen PHQ 2/9  Decreased Interest 0  Down, Depressed, Hopeless 1  PHQ - 2 Score 1  Altered sleeping 3  Tired, decreased energy 3  Change in appetite 2  Feeling bad or failure about yourself  0  Trouble  concentrating 2  Moving slowly or fidgety/restless 0  Suicidal thoughts 0  PHQ-9 Score 11  Difficult doing work/chores Somewhat difficult    Screening for Sleep Related Breathing Disorders  See questionnaire. Epworth Sleepiness Score is 12. Had sleep study 1-2 years ago  BMET    Component Value Date/Time   NA 139 07/26/2023 1502   NA 142 12/18/2022 1028   NA 141 07/28/2013 0410   K 3.4 (L) 07/26/2023 1502   K 3.7 07/28/2013 0410   CL 102 07/26/2023 1502   CL 103 07/28/2013 0410   CO2 26 07/26/2023 1502   CO2 29 07/28/2013 0410   GLUCOSE 93 07/26/2023 1502   GLUCOSE 121 (H) 07/28/2013 0410   BUN 10 07/26/2023 1502   BUN 9 12/18/2022 1028   BUN 16 07/28/2013 0410   CREATININE 0.85 07/26/2023 1502   CREATININE 0.66 07/28/2013 0410   CALCIUM 9.0  07/26/2023 1502   CALCIUM 8.6 07/28/2013 0410   GFRNONAA >60 07/26/2023 1502   GFRNONAA >60 07/28/2013 0410   GFRAA 108 12/06/2019 0955   GFRAA >60 07/28/2013 0410   Lab Results  Component Value Date   HGBA1C 5.6 06/06/2021   HGBA1C 5.5 12/06/2020   No results found for: INSULIN  CBC    Component Value Date/Time   WBC 11.3 (H) 07/26/2023 1502   RBC 5.37 (H) 07/26/2023 1502   HGB 13.6 07/26/2023 1502   HGB 12.6 07/23/2023 0838   HCT 42.0 07/26/2023 1502   HCT 40.4 07/23/2023 0838   PLT 408 (H) 07/26/2023 1502   PLT 400 07/23/2023 0838   MCV 78.2 (L) 07/26/2023 1502   MCV 81 07/23/2023 0838   MCV 88 07/28/2013 0410   MCH 25.3 (L) 07/26/2023 1502   MCHC 32.4 07/26/2023 1502   RDW 14.9 07/26/2023 1502   RDW 14.7 07/23/2023 0838   RDW 13.4 07/28/2013 0410   Iron/TIBC/Ferritin/ %Sat No results found for: IRON, TIBC, FERRITIN, IRONPCTSAT Lipid Panel     Component Value Date/Time   CHOL 180 12/18/2022 1028   TRIG 111 12/18/2022 1028   HDL 58 12/18/2022 1028   CHOLHDL 2.6 12/02/2017 1018   LDLCALC 102 (H) 12/18/2022 1028   Hepatic Function Panel     Component Value Date/Time   PROT 7.3 07/26/2023 1502   PROT 6.9 12/18/2022 1028   PROT 6.7 07/25/2013 0410   ALBUMIN 4.0 07/26/2023 1502   ALBUMIN 4.6 12/18/2022 1028   ALBUMIN 3.3 (L) 07/25/2013 0410   AST 36 07/26/2023 1502   AST 20 07/25/2013 0410   ALT 35 07/26/2023 1502   ALT 18 07/25/2013 0410   ALKPHOS 101 07/26/2023 1502   ALKPHOS 49 07/25/2013 0410   BILITOT 0.7 07/26/2023 1502   BILITOT 0.3 12/18/2022 1028   BILITOT 0.4 07/25/2013 0410   BILIDIR 0.10 02/18/2018 0911      Component Value Date/Time   TSH 1.080 12/18/2022 1028     Assessment and Plan   TREATMENT PLAN FOR OBESITY:  Recommended Dietary Goals  Kaytlynn is currently in the action stage of change. As such, her goal is to implement medically supervised obesity management plan.  She has agreed to implement: the Category 2 plan -  1200 kcal per day  Behavioral Intervention  We discussed the following Behavioral Modification Strategies today: increasing lean protein intake to established goals, decreasing simple carbohydrates , increasing vegetables, increasing lower glycemic fruits, increasing fiber rich foods, avoiding skipping meals, increasing water intake, work on meal planning and preparation, reading food labels ,  keeping healthy foods at home, identifying sources and decreasing liquid calories, decreasing eating out or consumption of processed foods, and making healthy choices when eating convenient foods, planning for success, and better snacking choices  Additional resources provided today: Handout on healthy eating and balanced plate, Handout on complex carbohydrates and lean sources of protein, Category 2 packet, and Handout principles of weight management  Recommended Physical Activity Goals  Lonnie has been advised to work up to 150 minutes of moderate intensity aerobic activity a week and strengthening exercises 2-3 times per week for cardiovascular health, weight loss maintenance and preservation of muscle mass.   She has agreed to :  Think about enjoyable ways to increase daily physical activity and overcoming barriers to exercise, Increase physical activity in their day and reduce sedentary time (increase NEAT)., Increase volume of physical activity to a goal of 240 minutes a week, and Combine aerobic and strengthening exercises for efficiency and improved cardiometabolic health.  Medical Interventions and Pharmacotherapy We will work on building a Therapist, art and behavioral strategies. We will discuss the role of pharmacotherapy as an adjunct at subsequent visits.   ASSOCIATED CONDITIONS ADDRESSED TODAY  Other Fatigue  Tanna does feel that her weight is causing her energy to be lower than it should be. Fatigue may be related to obesity, depression, current medications are  underlying conditions. Labs will be ordered, and in the meanwhile, Talulah will focus on self care including making healthy food choices, increasing physical activity and focusing on stress reduction.  Shortness of Breath Stephie notes increasing shortness of breath with physical activity and seems to be worsening over time with weight gain. She notes getting out of breath sooner with activity than she used to. This has not gotten worse recently. Haneefah denies shortness of breath at rest or orthopnea.  Assessment & Plan SOB (shortness of breath) on exertion  Other fatigue  Depression screen  Class 3 severe obesity with serious comorbidity and body mass index (BMI) of 40.0 to 44.9 in adult, unspecified obesity type family history of obesity, disruption of circadian rhythm / sleep disordered breathing, consumption of processed foods, use of obesogenic medications: Beta-blockers, Psychotropic medications, and Steroids, reduced physical activity, chronic skipping of meals, strong orexigenic signaling and/or inadequate inhibitory control , multiple weight loss attempts in the past, sedentary job, need for convenient foods, and difficulty with meal preparation Class 3 obesity with abnormal weight gain, likely multifactorial due to family history, medications, decreased physical activity due to MS, and dietary habits. Eating patterns, particularly skipping meals and frequent dining out, contribute to weight gain. Insulin  resistance may play a role in sweet cravings and high insulin  levels impact weight loss. - Order comprehensive blood work including B12, TSH, vitamin D , cholesterol, magnesium , potassium, ferritin, iron, insulin , and hemoglobin A1c. - Provide a 1200-calorie diet plan with 90 grams of protein per day. - Encourage meal prepping and reducing eating out by 50%. - Consider metformin after reviewing blood work results. - Provide educational materials on calorie deficit and balanced diet. -  Encourage water intake of 90 ounces per day. - Recommend using meal replacements with a salad or fruit to replace one meal per day.  Weight gain due to medication She is on several obesogenic medications may benefit from metformin to offset weight gain associated with these medications in addition to nutrition and behavioral strategies. Relapsing remitting multiple sclerosis (HCC) Patient notes changes in treatment.  She takes Adderall  for MS related fatigue.  Her MS also affects  her endurance and ability to engage in regular physical activity. Low mean corpuscular volume (MCV) Found on recent CBC we will check iron studies. Hypokalemia Asymptomatic, detected on recent CMP we will check potassium and magnesium  with today's labs.  Assessment and Plan    Follow-up  She was informed of the importance of frequent follow-up visits to maximize her success with intensive lifestyle modifications for her multiple health conditions. She was informed we would discuss her lab results at her next visit unless there is a critical issue that needs to be addressed sooner. Jamesha agreed to keep her next visit at the agreed upon time to discuss these results.  Attestation Statement  This is the patient's intake visit at [facility]. The patient's Health Questionnaire was reviewed at length. Included in the packet: current and past health history, medications, allergies, ROS, gynecologic history (women only), surgical history, family history, social history, weight history, weight loss surgery history (for those that have had weight loss surgery), nutritional evaluation, mood and food questionnaire, PHQ9, Epworth questionnaire, sleep habits questionnaire, patient life and health improvement goals questionnaire. These will all be scanned into the patient's chart under media.   During the visit, I independently reviewed the patient's, previous labs, bioimpedance scale results, and indirect  calorimetry results. I used this information to medically tailor a meal plan for the patient that will help her to lose weight and will improve her obesity-related conditions. I performed a medically necessary appropriate examination and/or evaluation. I discussed the assessment and treatment plan with the patient. The patient was provided an opportunity to ask questions and all were answered. The patient agreed with the plan and demonstrated an understanding of the instructions. Labs were ordered at this visit and will be reviewed at the next visit unless critical results need to be addressed immediately. Clinical information was updated and documented in the EMR.   In addition, they received basic education on identification of processed foods and reduction of these, different sources of lean proteins and complex carbohydrates and how to eat balanced by incorporation of whole foods.  Reviewed by clinician on day of visit: allergies, medications, problem list, medical history, surgical history, family history, social history, and previous encounter notes.  I have spent 67 minutes in the care of the patient today including: 5 minutes before the visit reviewing and preparing the chart. 53 minutes face-to-face assessing and reviewing listed medical problems as outlined in obesity care plan, providing nutritional and behavioral counseling on topics outlined in the obesity care plan, independently interpreting test results and goals of care, as described in assessment and plan, reviewing and discussing biometric information and progress, and ordering diagnostics - see orders 7 minutes after the visit updating chart and documentation of encounter.       Lucas Parker, MD

## 2023-10-27 NOTE — Assessment & Plan Note (Signed)
 family history of obesity, disruption of circadian rhythm / sleep disordered breathing, consumption of processed foods, use of obesogenic medications: Beta-blockers, Psychotropic medications, and Steroids, reduced physical activity, chronic skipping of meals, strong orexigenic signaling and/or inadequate inhibitory control , multiple weight loss attempts in the past, sedentary job, need for convenient foods, and difficulty with meal preparation Class 3 obesity with abnormal weight gain, likely multifactorial due to family history, medications, decreased physical activity due to MS, and dietary habits. Eating patterns, particularly skipping meals and frequent dining out, contribute to weight gain. Insulin  resistance may play a role in sweet cravings and high insulin  levels impact weight loss. - Order comprehensive blood work including B12, TSH, vitamin D , cholesterol, magnesium , potassium, ferritin, iron, insulin , and hemoglobin A1c. - Provide a 1200-calorie diet plan with 90 grams of protein per day. - Encourage meal prepping and reducing eating out by 50%. - Consider metformin after reviewing blood work results. - Provide educational materials on calorie deficit and balanced diet. - Encourage water intake of 90 ounces per day. - Recommend using meal replacements with a salad or fruit to replace one meal per day.

## 2023-10-27 NOTE — Assessment & Plan Note (Signed)
 She is on several obesogenic medications may benefit from metformin to offset weight gain associated with these medications in addition to nutrition and behavioral strategies.

## 2023-10-27 NOTE — Progress Notes (Deleted)
 [address] [place],  [place], [place] [address]  Office: [phone]  /  Fax: [phone]   Subjective   Initial Visit  [name] (MR# [id number]) is a 43 y.o. female who presents for evaluation and treatment of obesity and related comorbidities. Current BMI is Body mass index is 41.1 kg/m. [name] has been struggling with her weight for many years and has been unsuccessful in either losing weight, maintaining weight loss, or reaching her healthy weight goal.  [name] is currently in the action stage of change and ready to dedicate time achieving and maintaining a healthier weight. [name] is interested in becoming our patient and working on intensive lifestyle modifications including (but not limited to) diet and exercise for weight loss.  Weight history:  When asked how their weight has affected their life and health, she states: {EMWeightAffected:28305}  When asked what else they would like to accomplish? She states: {EMHopetoaccomplish:28304::Adopt a healthier eating pattern and lifestyle,Improve energy levels and physical activity,Improve existing medical conditions,Improve quality of life}  She starting to note weight gain during : {emstartedtogainweight:31588}.  Life events associated with weight gain include : {emlifeeventsweighgain:31589}.   Other contributing factors: {EMcontributingfactors:28307}.  Their highest weight has been:  *** lbs.  Desired weight: ***  Previous weight-loss programs : {emweightlossprograms:31590::None}.  Their maximum weight loss was:  *** lbs.  Their greatest challenge with dieting: {emgreatestchallengediet:31593}.  Current or previous pharmacotherapy: {EM previousRx:28311}.  Response to medication: {EMResponsetomedication:28312}   Nutritional History:  Current nutrition plan: {EMNutritionplan:28309::None}.  How many times do you eat outside the home: {emfrequency:31645}  How often do they skip meals:  {emskipmeals:31594::does not skip meals}  What beverages do they drink: {embeverages:31595}.   Use of artificial sweetners : {Yes/No:30480221}  Food intolerances or dislikes: {emfoodintolerance:31596::none}.  Food triggers: {emfoodtriggers:31600::None}.  Food cravings: {emfoodcravings:31601}  Do they struggle with excessive hunger or portion control : {YES/NO:21197}   Physical Activity:  Current level of physical activity: {EMcurrentPA:28310::None}  Barriers to Exercise: {embarrierstoexercise:32606::no barriers}   Past medical history includes:   Past Medical History:  Diagnosis Date   Allergy    Anxiety    Biliary colic    Depression    Elevated LDL cholesterol level    GERD (gastroesophageal reflux disease)    Migraine    MS (multiple sclerosis) (HCC)    Obesity    PONV (postoperative nausea and vomiting)    RBBB (right bundle branch block)    Right foot drop    Spinal stenosis    Vitamin D  deficiency      Objective   BP 112/76   Pulse 92   Temp 98.2 F (36.8 C)   Ht 5' 3 (1.6 m)   Wt 232 lb (105.2 kg)   SpO2 98%   BMI 41.10 kg/m  She was weighed on the bioimpedance scale: Body mass index is 41.1 kg/m.    Anthropometrics:  Vitals Temp: 98.2 F (36.8 C) BP: 112/76 Pulse Rate: 92 SpO2: 98 %   Anthropometric Measurements Height: 5' 3 (1.6 m) Weight: 232 lb (105.2 kg) BMI (Calculated): 41.11 Starting Weight: 232 lb Peak Weight: 233 lb   No data recorded Other Clinical Data Fasting: yes Labs: yes Today's Visit #: 10/27/2023    Physical Exam:  General: She is overweight, cooperative, alert, well developed, and in no acute distress. PSYCH: Has normal mood, affect and thought process.   HEENT: EOMI, sclerae are anicteric. Lungs: Normal breathing effort, no conversational dyspnea. Extremities: No edema.  Neurologic: No gross sensory or motor deficits. No  tremors or fasciculations noted.    Diagnostic Data Reviewed  EKG:  Normal sinus rhythm, rate 97 bpm. No conduction abnormalities, abnormal Q waves or chamber enlargement.  Indirect Calorimeter completed today shows a VO2 of 261 and a REE of 1800.  Her calculated basal metabolic rate is 8246 thus her resting energy expenditure faster than calculated.  Depression Screen  Laneka's PHQ-9 score was: 11.     10/27/2023    8:00 AM  Depression screen PHQ 2/9  Decreased Interest 0  Down, Depressed, Hopeless 1  PHQ - 2 Score 1  Altered sleeping 3  Tired, decreased energy 3  Change in appetite 2  Feeling bad or failure about yourself  0  Trouble concentrating 2  Moving slowly or fidgety/restless 0  Suicidal thoughts 0  PHQ-9 Score 11  Difficult doing work/chores Somewhat difficult    Screening for Sleep Related Breathing Disorders  [name] admits to daytime somnolence and admits to waking up still tired. Patient has a history of symptoms of morning fatigue and morning headache. [name] generally gets 5 or 6 hours of sleep per night, and states that she does not sleep well most nights. Snoring is not present. Apneic episodes are not present. Epworth Sleepiness Score is 12.   BMET    Component Value Date/Time   NA 139 07/26/2023 1502   NA 142 12/18/2022 1028   NA 141 07/28/2013 0410   K 3.4 (L) 07/26/2023 1502   K 3.7 07/28/2013 0410   CL 102 07/26/2023 1502   CL 103 07/28/2013 0410   CO2 26 07/26/2023 1502   CO2 29 07/28/2013 0410   GLUCOSE 93 07/26/2023 1502   GLUCOSE 121 (H) 07/28/2013 0410   BUN 10 07/26/2023 1502   BUN 9 12/18/2022 1028   BUN 16 07/28/2013 0410   CREATININE 0.85 07/26/2023 1502   CREATININE 0.66 07/28/2013 0410   CALCIUM 9.0 07/26/2023 1502   CALCIUM 8.6 07/28/2013 0410   GFRNONAA >60 07/26/2023 1502   GFRNONAA >60 07/28/2013 0410   GFRAA 108 12/06/2019 0955   GFRAA >60 07/28/2013 0410   Lab Results  Component Value Date   HGBA1C 5.6 06/06/2021   HGBA1C 5.5 12/06/2020   No results found for: INSULIN  CBC     Component Value Date/Time   WBC 11.3 (H) 07/26/2023 1502   RBC 5.37 (H) 07/26/2023 1502   HGB 13.6 07/26/2023 1502   HGB 12.6 07/23/2023 0838   HCT 42.0 07/26/2023 1502   HCT 40.4 07/23/2023 0838   PLT 408 (H) 07/26/2023 1502   PLT 400 07/23/2023 0838   MCV 78.2 (L) 07/26/2023 1502   MCV 81 07/23/2023 0838   MCV 88 07/28/2013 0410   MCH 25.3 (L) 07/26/2023 1502   MCHC 32.4 07/26/2023 1502   RDW 14.9 07/26/2023 1502   RDW 14.7 07/23/2023 0838   RDW 13.4 07/28/2013 0410   Iron/TIBC/Ferritin/ %Sat No results found for: IRON, TIBC, FERRITIN, IRONPCTSAT Lipid Panel     Component Value Date/Time   CHOL 180 12/18/2022 1028   TRIG 111 12/18/2022 1028   HDL 58 12/18/2022 1028   CHOLHDL 2.6 12/02/2017 1018   LDLCALC 102 (H) 12/18/2022 1028   Hepatic Function Panel     Component Value Date/Time   PROT 7.3 07/26/2023 1502   PROT 6.9 12/18/2022 1028   PROT 6.7 07/25/2013 0410   ALBUMIN 4.0 07/26/2023 1502   ALBUMIN 4.6 12/18/2022 1028   ALBUMIN 3.3 (L) 07/25/2013 0410   AST 36 07/26/2023 1502  AST 20 07/25/2013 0410   ALT 35 07/26/2023 1502   ALT 18 07/25/2013 0410   ALKPHOS 101 07/26/2023 1502   ALKPHOS 49 07/25/2013 0410   BILITOT 0.7 07/26/2023 1502   BILITOT 0.3 12/18/2022 1028   BILITOT 0.4 07/25/2013 0410   BILIDIR 0.10 02/18/2018 0911      Component Value Date/Time   TSH 1.080 12/18/2022 1028     Assessment and Plan   TREATMENT PLAN FOR OBESITY:  Recommended Dietary Goals  Malajah is currently in the action stage of change. As such, her goal is to implement medically supervised obesity management plan.  She has agreed to implement: {emwtlossplannewint:31639}  Behavioral Intervention  We discussed the following Behavioral Modification Strategies today: {EMwtlossstrategiesnewint:31640::increasing lean protein intake to established goals,decreasing simple carbohydrates ,increasing vegetables,increasing lower glycemic fruits,increasing  fiber rich foods,avoiding skipping meals,increasing water intake,work on meal planning and preparation,reading food labels ,keeping healthy foods at home,identifying sources and decreasing liquid calories,decreasing eating out or consumption of processed foods, and making healthy choices when eating convenient foods,planning for success,better snacking choices}  Additional resources provided today: {emadditionalresourcesnewint:32116::Handout on healthy eating and balanced plate,Handout on complex carbohydrates and lean sources of protein,Handout principles of weight management}  Recommended Physical Activity Goals  Haili has been advised to work up to 150 minutes of moderate intensity aerobic activity a week and strengthening exercises 2-3 times per week for cardiovascular health, weight loss maintenance and preservation of muscle mass.   She has agreed to :  {EMEXERCISE:28847::Think about enjoyable ways to increase daily physical activity and overcoming barriers to exercise,Increase physical activity in their day and reduce sedentary time (increase NEAT).,Increase volume of physical activity to a goal of 240 minutes a week,Combine aerobic and strengthening exercises for efficiency and improved cardiometabolic health.}  Medical Interventions and Pharmacotherapy We will work on building a Therapist, art and behavioral strategies. We will discuss the role of pharmacotherapy as an adjunct at subsequent visits.   ASSOCIATED CONDITIONS ADDRESSED TODAY  Other Fatigue ***. Lucca does feel that her weight is causing her energy to be lower than it should be. Fatigue may be related to obesity, depression or many other causes. Labs will be ordered, and in the meanwhile, Perl will focus on self care including making healthy food choices, increasing physical activity and focusing on stress reduction.  Shortness of Breath Bellatrix notes increasing  shortness of breath with physical activity and seems to be worsening over time with weight gain. She notes getting out of breath sooner with activity than she used to. This has not gotten worse recently. Ta denies shortness of breath at rest or orthopnea.  Assessment & Plan SOB (shortness of breath) on exertion  Other fatigue  Depression screen     Follow-up  She was informed of the importance of frequent follow-up visits to maximize her success with intensive lifestyle modifications for her multiple health conditions. She was informed we would discuss her lab results at her next visit unless there is a critical issue that needs to be addressed sooner. Cieanna agreed to keep her next visit at the agreed upon time to discuss these results.  Attestation Statement  This is the patient's intake visit at [facility]. The patient's Health Questionnaire was reviewed at length. Included in the packet: current and past health history, medications, allergies, ROS, gynecologic history (women only), surgical history, family history, social history, weight history, weight loss surgery history (for those that have had weight loss surgery), nutritional evaluation, mood and food questionnaire,  PHQ9, Epworth questionnaire, sleep habits questionnaire, patient life and health improvement goals questionnaire. These will all be scanned into the patient's chart under media.   During the visit, I independently reviewed the patient's EKG***, previous labs, bioimpedance scale results, and indirect calorimetry results. I used this information to medically tailor a meal plan for the patient that will help her to lose weight and will improve her obesity-related conditions. I performed a medically necessary appropriate examination and/or evaluation. I discussed the assessment and treatment plan with the patient. The patient was provided an opportunity to ask questions and all were answered. The patient  agreed with the plan and demonstrated an understanding of the instructions. Labs were ordered at this visit and will be reviewed at the next visit unless critical results need to be addressed immediately. Clinical information was updated and documented in the EMR.   In addition, they received basic education on identification of processed foods and reduction of these, different sources of lean proteins and complex carbohydrates and how to eat balanced by incorporation of whole foods.  Reviewed by clinician on day of visit: allergies, medications, problem list, medical history, surgical history, family history, social history, and previous encounter notes.  I have spent *** minutes in the care of the patient today including: {NUMBER 1-10:22536} minutes before the visit reviewing and preparing the chart. *** minutes face-to-face {emfacetoface:32598::assessing and reviewing listed medical problems as outlined in obesity care plan,providing nutritional and behavioral counseling on topics outlined in the obesity care plan,independently interpreting test results and goals of care, as described in assessment and plan,reviewing and discussing biometric information and progress} {NUMBER 1-10:22536} minutes after the visit updating chart and documentation of encounter.       Lucas Parker, MD

## 2023-10-29 LAB — CMP14+EGFR
ALT: 22 IU/L (ref 0–32)
AST: 15 IU/L (ref 0–40)
Albumin: 4.6 g/dL (ref 3.9–4.9)
Alkaline Phosphatase: 141 IU/L — ABNORMAL HIGH (ref 41–116)
BUN/Creatinine Ratio: 14 (ref 9–23)
BUN: 12 mg/dL (ref 6–24)
Bilirubin Total: 0.3 mg/dL (ref 0.0–1.2)
CO2: 22 mmol/L (ref 20–29)
Calcium: 9.6 mg/dL (ref 8.7–10.2)
Chloride: 101 mmol/L (ref 96–106)
Creatinine, Ser: 0.87 mg/dL (ref 0.57–1.00)
Globulin, Total: 2.4 g/dL (ref 1.5–4.5)
Glucose: 82 mg/dL (ref 70–99)
Potassium: 4.1 mmol/L (ref 3.5–5.2)
Sodium: 141 mmol/L (ref 134–144)
Total Protein: 7 g/dL (ref 6.0–8.5)
eGFR: 84 mL/min/1.73 (ref >59)

## 2023-10-29 LAB — LIPID PANEL WITH LDL/HDL RATIO
Cholesterol, Total: 226 mg/dL — ABNORMAL HIGH (ref 100–199)
HDL: 63 mg/dL (ref >39)
LDL Chol Calc (NIH): 138 mg/dL — ABNORMAL HIGH (ref 0–99)
LDL/HDL Ratio: 2.2 ratio (ref 0.0–3.2)
Triglycerides: 141 mg/dL (ref 0–149)
VLDL Cholesterol Cal: 25 mg/dL (ref 5–40)

## 2023-10-29 LAB — CBC WITH DIFFERENTIAL/PLATELET
Basophils Absolute: 0 x10E3/uL (ref 0.0–0.2)
Basos: 0 %
EOS (ABSOLUTE): 0.2 x10E3/uL (ref 0.0–0.4)
Eos: 3 %
Hematocrit: 41.3 % (ref 34.0–46.6)
Hemoglobin: 13.4 g/dL (ref 11.1–15.9)
Immature Grans (Abs): 0 x10E3/uL (ref 0.0–0.1)
Immature Granulocytes: 0 %
Lymphocytes Absolute: 1.4 x10E3/uL (ref 0.7–3.1)
Lymphs: 16 %
MCH: 25.9 pg — ABNORMAL LOW (ref 26.6–33.0)
MCHC: 32.4 g/dL (ref 31.5–35.7)
MCV: 80 fL (ref 79–97)
Monocytes Absolute: 0.5 x10E3/uL (ref 0.1–0.9)
Monocytes: 6 %
Neutrophils Absolute: 6.4 x10E3/uL (ref 1.4–7.0)
Neutrophils: 75 %
Platelets: 419 x10E3/uL (ref 150–450)
RBC: 5.17 x10E6/uL (ref 3.77–5.28)
RDW: 15.1 % (ref 11.7–15.4)
WBC: 8.6 x10E3/uL (ref 3.4–10.8)

## 2023-10-29 LAB — VITAMIN D 25 HYDROXY (VIT D DEFICIENCY, FRACTURES): Vit D, 25-Hydroxy: 46.8 ng/mL (ref 30.0–100.0)

## 2023-10-29 LAB — HEMOGLOBIN A1C
Est. average glucose Bld gHb Est-mCnc: 120 mg/dL
Hgb A1c MFr Bld: 5.8 % — ABNORMAL HIGH (ref 4.8–5.6)

## 2023-10-29 LAB — IRON AND TIBC
Iron Saturation: 9 % — CL (ref 15–55)
Iron: 43 ug/dL (ref 27–159)
Total Iron Binding Capacity: 473 ug/dL — ABNORMAL HIGH (ref 250–450)
UIBC: 430 ug/dL — ABNORMAL HIGH (ref 131–425)

## 2023-10-29 LAB — VITAMIN B12: Vitamin B-12: 409 pg/mL (ref 232–1245)

## 2023-10-29 LAB — FERRITIN: Ferritin: 31 ng/mL (ref 15–150)

## 2023-10-29 LAB — MAGNESIUM: Magnesium: 2.3 mg/dL (ref 1.6–2.3)

## 2023-10-29 LAB — INSULIN, RANDOM: INSULIN: 23.7 u[IU]/mL (ref 2.6–24.9)

## 2023-10-29 LAB — TSH: TSH: 1.84 u[IU]/mL (ref 0.450–4.500)

## 2023-11-03 ENCOUNTER — Other Ambulatory Visit: Payer: Self-pay | Admitting: Nurse Practitioner

## 2023-11-05 ENCOUNTER — Encounter: Payer: Self-pay | Admitting: Nurse Practitioner

## 2023-11-05 NOTE — Telephone Encounter (Signed)
 Too soon for refill, LRF 07/04/23 for 30 and 12 RF.  Requested Prescriptions  Pending Prescriptions Disp Refills   buPROPion  (WELLBUTRIN  XL) 150 MG 24 hr tablet [Pharmacy Med Name: BUPROPION  HCL ER (XL) 150 MG TAB] 30 tablet     Sig: TAKE ONE TABLET BY MOUTH ONCE DAILY     Psychiatry: Antidepressants - bupropion  Passed - 11/05/2023 11:50 AM      Passed - Cr in normal range and within 360 days    Creatinine  Date Value Ref Range Status  07/28/2013 0.66 0.60 - 1.30 mg/dL Final   Creatinine, Ser  Date Value Ref Range Status  10/27/2023 0.87 0.57 - 1.00 mg/dL Final         Passed - AST in normal range and within 360 days    AST  Date Value Ref Range Status  10/27/2023 15 0 - 40 IU/L Final   SGOT(AST)  Date Value Ref Range Status  07/25/2013 20 15 - 37 Unit/L Final         Passed - ALT in normal range and within 360 days    ALT  Date Value Ref Range Status  10/27/2023 22 0 - 32 IU/L Final   SGPT (ALT)  Date Value Ref Range Status  07/25/2013 18 12 - 78 U/L Final         Passed - Last BP in normal range    BP Readings from Last 1 Encounters:  10/27/23 112/76         Passed - Valid encounter within last 6 months    Recent Outpatient Visits           2 weeks ago 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, Melanie DASEN, NP

## 2023-11-07 ENCOUNTER — Ambulatory Visit: Payer: Self-pay | Admitting: Nurse Practitioner

## 2023-11-07 DIAGNOSIS — F0632 Mood disorder due to known physiological condition with major depressive-like episode: Secondary | ICD-10-CM

## 2023-11-07 LAB — COLOGUARD: COLOGUARD: NEGATIVE

## 2023-11-07 NOTE — Progress Notes (Signed)
 Contacted via MyChart  Negative cologuard!!  [employer].  Repeat in 3 years.

## 2023-11-07 NOTE — Telephone Encounter (Signed)
 Copied from CRM [id number]. Topic: Referral - Status >> Nov 07, 2023  9:49 AM Ivette P wrote: Reason for CRM: Vina caled in to report that pt is not answering the phone to be scheduled for referral.  Pls have pt reach out to be scheduled    [phone] to scheudle This encounter was created in error - please disregard.

## 2023-11-07 NOTE — Telephone Encounter (Signed)
 Dr. Leonarda office- Referral for Psychiatric

## 2023-11-10 ENCOUNTER — Ambulatory Visit (INDEPENDENT_AMBULATORY_CARE_PROVIDER_SITE_OTHER): Admitting: Internal Medicine

## 2023-11-10 ENCOUNTER — Encounter (INDEPENDENT_AMBULATORY_CARE_PROVIDER_SITE_OTHER): Payer: Self-pay | Admitting: Internal Medicine

## 2023-11-10 VITALS — BP 120/76 | HR 80 | Temp 98.2°F | Ht 63.0 in | Wt 231.0 lb

## 2023-11-10 DIAGNOSIS — R748 Abnormal levels of other serum enzymes: Secondary | ICD-10-CM | POA: Insufficient documentation

## 2023-11-10 DIAGNOSIS — E66813 Obesity, class 3: Secondary | ICD-10-CM

## 2023-11-10 DIAGNOSIS — T50905D Adverse effect of unspecified drugs, medicaments and biological substances, subsequent encounter: Secondary | ICD-10-CM | POA: Diagnosis not present

## 2023-11-10 DIAGNOSIS — E7800 Pure hypercholesterolemia, unspecified: Secondary | ICD-10-CM

## 2023-11-10 DIAGNOSIS — T50905A Adverse effect of unspecified drugs, medicaments and biological substances, initial encounter: Secondary | ICD-10-CM

## 2023-11-10 DIAGNOSIS — Z6841 Body Mass Index (BMI) 40.0 and over, adult: Secondary | ICD-10-CM

## 2023-11-10 DIAGNOSIS — R7303 Prediabetes: Secondary | ICD-10-CM | POA: Diagnosis not present

## 2023-11-10 DIAGNOSIS — E611 Iron deficiency: Secondary | ICD-10-CM | POA: Diagnosis not present

## 2023-11-10 MED ORDER — METFORMIN HCL ER 500 MG PO TB24: 500.0000 mg | ORAL_TABLET | Freq: Every day | ORAL | 0 refills | Status: DC

## 2023-11-10 MED ORDER — FERROUS SULFATE 325 (65 FE) MG PO TABS: 325.0000 mg | ORAL_TABLET | ORAL | 0 refills | Status: AC

## 2023-11-10 NOTE — Assessment & Plan Note (Signed)
 Most recent A1c is  Lab Results  Component Value Date   HGBA1C 5.8 (H) 10/27/2023   HGBA1C 5.5 12/06/2020    Patient aware of disease state and risk of progression. This may contribute to abnormal cravings, fatigue and diabetic complications without having diabetes.   We have discussed treatment options which include: losing 7 to 10% of body weight, increasing physical activity to a goal of 150 minutes a week at moderate intensity.  Advised to maintain a diet low on simple and processed carbohydrates.  Discussion of benefits and side effects she will be started on metformin XR 500 mg once a day

## 2023-11-10 NOTE — Assessment & Plan Note (Signed)
 We will do evaluation at the next visit to determine if from liver or bone if positive for liver recommend an ultrasound of the liver.

## 2023-11-10 NOTE — Progress Notes (Signed)
 Office: [phone]  /  Fax: [phone]  Weight Summary and Body Composition Analysis (BIA)  Vitals Temp: 98.2 F (36.8 C) BP: 120/76 Pulse Rate: 80 SpO2: 98 %   Anthropometric Measurements Height: 5' 3 (1.6 m) Weight: 231 lb (104.8 kg) BMI (Calculated): 40.93 Weight at Last Visit: 232 lb Weight Lost Since Last Visit: 1 lb Weight Gained Since Last Visit: 0 lb Starting Weight: 232 lb Total Weight Loss (lbs): 1 lb (0.454 kg) Peak Weight: 233 lb   Body Composition  Body Fat %: 47.5 % Fat Mass (lbs): 109.8 lbs Muscle Mass (lbs): 115.2 lbs Total Body Water (lbs): 81.4 lbs Visceral Fat Rating : 14    RMR: 1800  Today's Visit #: 10/27/2023  No data recorded  Subjective   Chief Complaint: Obesity  Interval History Discussed the use of AI scribe software for clinical note transcription with the patient, who gave verbal consent to proceed.  History of Present Illness [name] is a 43 year old female with relapsing, remitting multiple sclerosis who presents for medical weight management follow-up.  She follows her 1200 calorie nutrition plan only 25% of the time, does not track her intake, and often skips meals despite eating more whole foods. Breakfast is particularly challenging, and she is attempting to incorporate meal replacement shakes instead of skipping it. Her variable work schedule often interferes with her ability to have lunch, as she sometimes works through lunch hours.  Her history of relapsing, remitting multiple sclerosis impacts her physical activity. She is not currently engaging in any exercise but has a window in the mornings for potential exercise and is considering starting with stretching and low-impact exercises. Despite teaching mindfulness, she dislikes yoga. She is exploring YouTube videos for exercise routines, including the [employer] channel for exercises tailored to individuals with MS.  Recent lab results show an elevated  alkaline phosphatase. She had her gallbladder removed in July. Her LDL cholesterol is elevated at 138 mg/dL. She has mild iron deficiency but no anemia, and her fasting blood sugar is 82 mg/dL. She is prediabetic with an A1c of 5.8 and has insulin  resistance, with fasting insulin  levels three times the normal amount. She has a family history of heart disease, with her grandfather having a heart attack in his fifties.  She is currently taking Adderall , Wellbutrin , sertraline , and trazodone . She experiences fatigue, particularly in the afternoons. She identifies as a morning person and is trying to establish a more consistent sleep schedule. No history of smoking. Sleep study conducted one to two years ago showed no significant findings.    The 10-year ASCVD risk score (Arnett DK, et al., 2019) is: 0.7%  Challenges affecting patient progress: low volume of physical activity at present , medical comorbidities, and presence of obesogenic drugs.    Pharmacotherapy for weight management: She is currently taking no anti-obesity medication and states interest in starting a medication to aid with weight loss citing difficulty with maintaining a reduced calorie state and weight loss.   Assessment and Plan   Treatment Plan For Obesity:  Recommended Dietary Goals  Darshana is currently in the action stage of change. As such, her goal is to continue weight management plan. She has agreed to: incorporate prepackaged healthy meals for convenience, incorporate 1-2 meal replacements a day for convenience , and continue current plan  Behavioral Health and Counseling  We discussed the following behavioral modification strategies today: work on meal planning and preparation, work on tracking and journaling calories using tracking  application, avoiding temptations and identifying enticing environmental cues, continue to work on implementation of reduced calorie nutritional plan, planning for success, continue to  work on maintaining a reduced calorie state, getting the recommended amount of protein, incorporating whole foods, making healthy choices, staying well hydrated and practicing mindfulness when eating., and increase protein intake, fibrous foods (25 grams per day for women, 30 grams for men) and water to improve satiety and decrease hunger signals. .  Additional education and resources provided today: Handout on increasing daily activity and exercise goal setting  Recommended Physical Activity Goals  Joyceline has been advised to work up to 150 minutes of moderate intensity aerobic activity a week and strengthening exercises 2-3 times per week for cardiovascular health, weight loss maintenance and preservation of muscle mass.  She has agreed to :  Think about enjoyable ways to increase daily physical activity and overcoming barriers to exercise and Increase physical activity in their day and reduce sedentary time (increase NEAT).  Medical Interventions and Pharmacotherapy  We discussed various medication options to help Darriel with her weight loss efforts and we both agreed to : After discussion of benefits and side effects she will be started on metformin XR 500 mg once a day  Associated Conditions Impacted by Obesity Treatment  Assessment & Plan Iron deficiency She has borderline serum iron and ferritin levels.  She will be started on low-dose iron ferrous sulfate 325 1 tablet every other day for 6 months. Class 3 severe obesity with serious comorbidity and body mass index (BMI) of 40.0 to 44.9 in adult, unspecified obesity type (HCC) See obesity treatment plan above - Implement a structured meal plan with meal replacements for breakfast and [employer]al Prep for lunch and dinner. - Encourage tracking of calorie intake and meal planning. - Initiate a physical activity routine focusing on strengthening exercises, utilizing YouTube resources for low-impact exercises suitable for MS. -  Educate on the benefits of exercise as medicine, including its role in modulating immune function and reducing visceral fat. - Discuss the potential use of metformin for weight management and its benefits, including appetite suppression and gut microbiome improvement. Weight gain due to medication She is on several obesogenic medications may benefit from metformin to offset weight gain associated with these medications in addition to nutrition and behavioral strategies. Prediabetes Most recent A1c is  Lab Results  Component Value Date   HGBA1C 5.8 (H) 10/27/2023   HGBA1C 5.5 12/06/2020    Patient aware of disease state and risk of progression. This may contribute to abnormal cravings, fatigue and diabetic complications without having diabetes.   We have discussed treatment options which include: losing 7 to 10% of body weight, increasing physical activity to a goal of 150 minutes a week at moderate intensity.  Advised to maintain a diet low on simple and processed carbohydrates.  Discussion of benefits and side effects she will be started on metformin XR 500 mg once a day Elevated alkaline phosphatase level We will do evaluation at the next visit to determine if from liver or bone if positive for liver recommend an ultrasound of the liver. Pure hypercholesterolemia LDL is not at goal. Elevated LDL may be secondary to nutrition, genetics and spillover effect from excess adiposity. Recommended LDL goal is <70 to reduce the risk of fatty streaks and the progression to obstructive ASCVD in the future.   Her 10 year risk is: The 10-year ASCVD risk score (Arnett DK, et al., 2019) is: 0.7%  Lab Results  Component Value Date   CHOL 226 (H) 10/27/2023   HDL 63 10/27/2023   LDLCALC 138 (H) 10/27/2023   TRIG 141 10/27/2023   CHOLHDL 2.6 12/02/2017    Continue weight loss therapy, losing 10% or more of body weight may improve condition. Also advised to reduce saturated fats in diet to less  than 10% of daily calories.    Considering cardiovascular risk she does not benefit from statin at present time.   Recording duration: 45 minutes        Objective   Physical Exam:  Blood pressure 120/76, pulse 80, temperature 98.2 F (36.8 C), height 5' 3 (1.6 m), weight 231 lb (104.8 kg), SpO2 98%. Body mass index is 40.92 kg/m.  General: She is overweight, cooperative, alert, well developed, and in no acute distress. PSYCH: Has normal mood, affect and thought process.   HEENT: EOMI, sclerae are anicteric. Lungs: Normal breathing effort, no conversational dyspnea. Extremities: No edema.  Neurologic: No gross sensory or motor deficits. No tremors or fasciculations noted.    Diagnostic Data Reviewed:  BMET    Component Value Date/Time   NA 141 10/27/2023 0920   NA 141 07/28/2013 0410   K 4.1 10/27/2023 0920   K 3.7 07/28/2013 0410   CL 101 10/27/2023 0920   CL 103 07/28/2013 0410   CO2 22 10/27/2023 0920   CO2 29 07/28/2013 0410   GLUCOSE 82 10/27/2023 0920   GLUCOSE 93 07/26/2023 1502   GLUCOSE 121 (H) 07/28/2013 0410   BUN 12 10/27/2023 0920   BUN 16 07/28/2013 0410   CREATININE 0.87 10/27/2023 0920   CREATININE 0.66 07/28/2013 0410   CALCIUM 9.6 10/27/2023 0920   CALCIUM 8.6 07/28/2013 0410   GFRNONAA >60 07/26/2023 1502   GFRNONAA >60 07/28/2013 0410   GFRAA 108 12/06/2019 0955   GFRAA >60 07/28/2013 0410   Lab Results  Component Value Date   HGBA1C 5.8 (H) 10/27/2023   HGBA1C 5.5 12/06/2020   Lab Results  Component Value Date   INSULIN  23.7 10/27/2023   Lab Results  Component Value Date   TSH 1.840 10/27/2023   CBC    Component Value Date/Time   WBC 8.6 10/27/2023 0920   WBC 11.3 (H) 07/26/2023 1502   RBC 5.17 10/27/2023 0920   RBC 5.37 (H) 07/26/2023 1502   HGB 13.4 10/27/2023 0920   HCT 41.3 10/27/2023 0920   PLT 419 10/27/2023 0920   MCV 80 10/27/2023 0920   MCV 88 07/28/2013 0410   MCH 25.9 (L) 10/27/2023 0920   MCH 25.3 (L)  07/26/2023 1502   MCHC 32.4 10/27/2023 0920   MCHC 32.4 07/26/2023 1502   RDW 15.1 10/27/2023 0920   RDW 13.4 07/28/2013 0410   Iron Studies    Component Value Date/Time   IRON 43 10/27/2023 0920   TIBC 473 (H) 10/27/2023 0920   FERRITIN 31 10/27/2023 0920   IRONPCTSAT 9 (LL) 10/27/2023 0920   Lipid Panel     Component Value Date/Time   CHOL 226 (H) 10/27/2023 0920   TRIG 141 10/27/2023 0920   HDL 63 10/27/2023 0920   CHOLHDL 2.6 12/02/2017 1018   LDLCALC 138 (H) 10/27/2023 0920   Hepatic Function Panel     Component Value Date/Time   PROT 7.0 10/27/2023 0920   PROT 6.7 07/25/2013 0410   ALBUMIN 4.6 10/27/2023 0920   ALBUMIN 3.3 (L) 07/25/2013 0410   AST 15 10/27/2023 0920   AST 20 07/25/2013 0410   ALT 22 10/27/2023  0920   ALT 18 07/25/2013 0410   ALKPHOS 141 (H) 10/27/2023 0920   ALKPHOS 49 07/25/2013 0410   BILITOT 0.3 10/27/2023 0920   BILITOT 0.4 07/25/2013 0410   BILIDIR 0.10 02/18/2018 0911      Component Value Date/Time   TSH 1.840 10/27/2023 0920   Nutritional Lab Results  Component Value Date   VD25OH 46.8 10/27/2023   VD25OH 59.2 12/18/2022   VD25OH 56.3 06/18/2022    Medications: Outpatient Encounter Medications as of 11/10/2023  Medication Sig Note  . albuterol  (VENTOLIN  HFA) 108 (90 Base) MCG/ACT inhaler Inhale 2 puffs into the lungs every 6 (six) hours as needed for wheezing or shortness of breath. 08/15/2023: Last time used was about a year ago pt stated 08/15/23  . amphetamine -dextroamphetamine  (ADDERALL  XR) 30 MG 24 hr capsule TAKE 1 CAPSULE BY MOUTH DAILY   . baclofen  (LIORESAL ) 10 MG tablet Take 1-3 tablets (10-30 mg total) by mouth See admin instructions. Take 10 mg in the morning and 30 mg at bedtime   . buPROPion  (WELLBUTRIN  XL) 300 MG 24 hr tablet Take 1 tablet (300 mg total) by mouth daily.   . Cholecalciferol 125 MCG (5000 UT) TABS Take 5,000 Units by mouth daily.   . ferrous sulfate 325 (65 FE) MG tablet Take 1 tablet (325 mg total)  by mouth every other day.   . fluticasone  (FLONASE ) 50 MCG/ACT nasal spray Place 1 spray into both nostrils daily as needed for allergies or rhinitis.   . meclizine  (ANTIVERT ) 25 MG tablet Take 1 tablet (25 mg total) by mouth 3 (three) times daily as needed for dizziness.   . metFORMIN (GLUCOPHAGE-XR) 500 MG 24 hr tablet Take 1 tablet (500 mg total) by mouth daily with breakfast.   . ocrelizumab 600 mg in sodium chloride  0.9 % 500 mL Inject 600 mg into the vein every 6 (six) months. 08/15/2023: Last time taken was on 04/04/23 pt stated 08/15/23  . propranolol  (INDERAL ) 10 MG tablet Take 1 tablet (10 mg total) by mouth daily as needed. For severe anxiety attack   . sertraline  (ZOLOFT ) 100 MG tablet Take 2 tablets (200 mg total) by mouth every morning.   . traZODone  (DESYREL ) 100 MG tablet Take 1 tablet (100 mg total) by mouth at bedtime.   . acetaminophen  (TYLENOL ) 500 MG tablet Take 1,000 mg by mouth every 6 (six) hours as needed for moderate pain (pain score 4-6). (Patient not taking: Reported on 11/10/2023)    No facility-administered encounter medications on file as of 11/10/2023.     Follow-Up   Return in about 3 weeks (around 12/01/2023) for For Weight Mangement with Dr. Francyne.SABRA She was informed of the importance of frequent follow up visits to maximize her success with intensive lifestyle modifications for her multiple health conditions.  Attestation Statement   Reviewed by clinician on day of visit: allergies, medications, problem list, medical history, surgical history, family history, social history, and previous encounter notes.   I have spent 47 minutes in the care of the patient today including: 2 minutes before the visit reviewing and preparing the chart. 41 minutes face-to-face assessing and reviewing listed medical problems as outlined in obesity care plan, providing nutritional and behavioral counseling on topics outlined in the obesity care plan, counseling regarding  anti-obesity medication as outlined in obesity care plan, independently interpreting test results and goals of care, as described in assessment and plan, reviewing and discussing biometric information and progress, and ordering medications - see orders 5 minutes  after the visit updating chart and documentation of encounter.     Lucas Parker, MD

## 2023-11-10 NOTE — Assessment & Plan Note (Signed)
 She is on several obesogenic medications may benefit from metformin to offset weight gain associated with these medications in addition to nutrition and behavioral strategies.

## 2023-11-10 NOTE — Assessment & Plan Note (Signed)
 See obesity treatment plan above - Implement a structured meal plan with meal replacements for breakfast and [employer] Prep for lunch and dinner. - Encourage tracking of calorie intake and meal planning. - Initiate a physical activity routine focusing on strengthening exercises, utilizing YouTube resources for low-impact exercises suitable for MS. - Educate on the benefits of exercise as medicine, including its role in modulating immune function and reducing visceral fat. - Discuss the potential use of metformin for weight management and its benefits, including appetite suppression and gut microbiome improvement.

## 2023-11-20 ENCOUNTER — Telehealth: Payer: Self-pay | Admitting: Nurse Practitioner

## 2023-11-20 ENCOUNTER — Encounter: Payer: Self-pay | Admitting: Neurology

## 2023-11-20 ENCOUNTER — Other Ambulatory Visit: Payer: Self-pay | Admitting: *Deleted

## 2023-11-20 MED ORDER — AMPHETAMINE-DEXTROAMPHET ER 30 MG PO CP24: 30.0000 mg | ORAL_CAPSULE | Freq: Every day | ORAL | 0 refills | Status: DC

## 2023-11-20 NOTE — Telephone Encounter (Signed)
 Pt sent mychart asking for refill on Adderall . Last seen 10/01/23 and next f/u 02/24/24. Last refilled 10/18/23 #30

## 2023-11-20 NOTE — Telephone Encounter (Signed)
 Copied from CRM #[id number]. Topic: Referral - Status >> Nov 20, 2023  9:14 AM Lonell PEDLAR wrote: Reason for CRM: Vina from Dr. [place] office call to state that they have received two referrals for pt. They have made multiple attempts to contact patient, with no response.

## 2023-11-21 ENCOUNTER — Encounter: Payer: Self-pay | Admitting: Nurse Practitioner

## 2023-12-01 ENCOUNTER — Ambulatory Visit (INDEPENDENT_AMBULATORY_CARE_PROVIDER_SITE_OTHER): Admitting: Internal Medicine

## 2023-12-01 ENCOUNTER — Encounter (INDEPENDENT_AMBULATORY_CARE_PROVIDER_SITE_OTHER): Payer: Self-pay | Admitting: Internal Medicine

## 2023-12-01 VITALS — BP 109/76 | HR 99 | Temp 98.6°F | Ht 63.0 in | Wt 231.0 lb

## 2023-12-01 DIAGNOSIS — T50905D Adverse effect of unspecified drugs, medicaments and biological substances, subsequent encounter: Secondary | ICD-10-CM | POA: Diagnosis not present

## 2023-12-01 DIAGNOSIS — G35A Relapsing-remitting multiple sclerosis: Secondary | ICD-10-CM | POA: Diagnosis not present

## 2023-12-01 DIAGNOSIS — R635 Abnormal weight gain: Secondary | ICD-10-CM

## 2023-12-01 DIAGNOSIS — Z6841 Body Mass Index (BMI) 40.0 and over, adult: Secondary | ICD-10-CM

## 2023-12-01 DIAGNOSIS — E88819 Insulin resistance, unspecified: Secondary | ICD-10-CM | POA: Diagnosis not present

## 2023-12-01 DIAGNOSIS — E66813 Obesity, class 3: Secondary | ICD-10-CM | POA: Diagnosis not present

## 2023-12-01 DIAGNOSIS — R7303 Prediabetes: Secondary | ICD-10-CM

## 2023-12-01 MED ORDER — METFORMIN HCL ER 500 MG PO TB24: 500.0000 mg | ORAL_TABLET | Freq: Two times a day (BID) | ORAL | 0 refills | Status: DC

## 2023-12-01 NOTE — Assessment & Plan Note (Signed)
 Most recent A1c is  Lab Results  Component Value Date   HGBA1C 5.8 (H) 10/27/2023   HGBA1C 5.5 12/06/2020    Patient aware of disease state and risk of progression. This may contribute to abnormal cravings, fatigue and diabetic complications without having diabetes.   We have discussed treatment options which include: losing 7 to 10% of body weight, increasing physical activity to a goal of 150 minutes a week at moderate intensity.  Advised to maintain a diet low on simple and processed carbohydrates.  Increase metformin XR to 1 tablet twice a day

## 2023-12-01 NOTE — Assessment & Plan Note (Signed)
 She is on several obesogenic medications may benefit from metformin to offset weight gain associated with these medications in addition to nutrition and behavioral strategies.

## 2023-12-01 NOTE — Progress Notes (Signed)
 Office: [phone]  /  Fax: [phone]  Weight Summary and Body Composition Analysis (BIA)  Vitals Temp: 98.6 F (37 C) BP: 109/76 Pulse Rate: 99 SpO2: 98 %   Anthropometric Measurements Height: 5' 3 (1.6 m) Weight: 231 lb (104.8 kg) BMI (Calculated): 40.93 Weight at Last Visit: 231lb Weight Lost Since Last Visit: 0lb Weight Gained Since Last Visit: 0lb Starting Weight: 232lb Total Weight Loss (lbs): 1 lb (0.454 kg) Peak Weight: 233lb Waist Measurement : 52 inches   Body Composition  Body Fat %: 47.2 % Fat Mass (lbs): 109.4 lbs Muscle Mass (lbs): 116 lbs Total Body Water (lbs): 81.4 lbs Visceral Fat Rating : 14    RMR: 1800  Today's Visit #: 3  Starting Date: 10/27/23   Subjective   Chief Complaint: Obesity  Interval History Discussed the use of AI scribe software for clinical note transcription with the patient, who gave verbal consent to proceed.  History of Present Illness [name] is a 43 year old female with prediabetes and insulin  resistance who presents for weight management consultation.   She recently started metformin, initially dosing at night based on her daughter's experience. She has been on it for a few days and is considering switching to morning dosing. She is experiencing gastrointestinal side effects such as loose stools and is attempting to reduce sugar intake, which has resulted in headaches.  She is in the initiation phase of weight loss, aiming for a 1200 calorie diet. Her metabolic rate was previously measured at 1700-1800 calories. She has tracked and journaled her diet in the past but finds it can become obsessive. She is considering using technology and applications to assist with tracking her diet and calorie intake.  She has a supportive family and friends, although her husband and mother-in-law are skeptical about her calorie goals. She has a friend with diabetes who supports her efforts to reduce sugar  intake. Her insulin  levels were previously measured at 23.7, indicating insulin  resistance.  She is exploring various tools and resources for meal planning and tracking, including apps like Lucid, My Fitness Pal, and MyNet Diary, as well as using AI for meal planning. She is considering incorporating high protein, easy-to-prepare meals into her diet.  She is considering physical activity adjustments. She works on the first floor and tries to take the stairs to the second floor two to three times a day, depending on her leg strength. She experiences weakness primarily in her lower body and sometimes in her hands, which she attributes to her multiple sclerosis. She is exploring options for exercise that accommodate her MS, such as chair exercises and pool programs.  Her current medication regimen includes metformin, which she has recently started, and she is considering increasing the dose to twice daily. She is also on bupropion  and an amphetamine . She has an IUD and does not plan on becoming pregnant. She has not been diagnosed with significant sleep apnea and does not use CPAP therapy.     Challenges affecting patient progress: orthopedic problems, medical conditions or chronic pain affecting mobility, medical comorbidities, and presence of obesogenic drugs.    Pharmacotherapy for weight management: She is currently taking Metformin (off label use for weight management and / or insulin  resistance and / or diabetes prevention) with adequate clinical response  and without side effects..   Assessment and Plan   Treatment Plan For Obesity:  Recommended Dietary Goals  [name] is currently in the action stage of change. As such, her goal is  to continue weight management plan. She has agreed to: incorporate prepackaged healthy meals for convenience, incorporate 1-2 meal replacements a day for convenience , and continue current plan  Behavioral Health and Counseling  We discussed the following  behavioral modification strategies today: work on meal planning and preparation, work on tracking and journaling calories using tracking application, reading food labels , continue to work on implementation of reduced calorie nutritional plan, and rethink your why .  Additional education and resources provided today: Handout and personalized instruction on tracking and journaling using Apps, Provided with personal guidance and instructions on how to use Skinnytaste.com for healthy meal ideas and cooking in bulk., and Personalized instruction on the use of YUKA app to quickly assess nutritional value of prepackaged foods.  Recommended Physical Activity Goals  Catheline has been advised to work up to 150 minutes of moderate intensity aerobic activity a week and strengthening exercises 2-3 times per week for cardiovascular health, weight loss maintenance and preservation of muscle mass.  She has agreed to :  Think about enjoyable ways to increase daily physical activity and overcoming barriers to exercise, Increase physical activity in their day and reduce sedentary time (increase NEAT)., Increase volume of physical activity to a goal of 240 minutes a week, and Combine aerobic and strengthening exercises for efficiency and improved cardiometabolic health.  Medical Interventions and Pharmacotherapy  We discussed various medication options to help [name] with her weight loss efforts and we both agreed to : Increase metformin to 1 tablet twice a day  Associated Conditions Impacted by Obesity Treatment  Assessment & Plan Insulin  resistance Her HOMA-IR is 4.65 which is elevated. Optimal level < 1.9.   This is complex condition associated with genetics, ectopic fat and lifestyle factors. Insulin  resistance may result in increased fat storage, inhibition of the breakdown of fat, cause fluctuations in blood sugar leading to energy crashes and increased cravings for sugary or high carb foods and cause  metabolic slowdown making it difficult to lose weight.  This may result in additional weight gain and lead to pre-diabetes and diabetes if untreated. In addition, hyperinsulinemia increases cardiovascular risk, chronic inflammatory response and may increase the risk of obesity related malignancies.  Lab Results  Component Value Date   HGBA1C 5.8 (H) 10/27/2023   Lab Results  Component Value Date   INSULIN  23.7 10/27/2023   Lab Results  Component Value Date   GLUCOSE 82 10/27/2023   GLUCOSE 89 07/23/2013    We reviewed treatment options which include losing 7 to 10% of body weight, increasing volume of physical activity and maintaining a diet low in saturated fats and with a low glycemic load.  Patient has also been educated on the carb insulin  model of obesity.  She is now on metformin XR medication will be increased to twice a day for pharmacoprophylaxis  Class 3 severe obesity with serious comorbidity and body mass index (BMI) of 40.0 to 44.9 in adult, unspecified obesity type (HCC) Obesity management is in the initiation phase, focusing on dietary adjustments and calorie tracking.  - Increase metformin to twice daily, one with breakfast and one with dinner. - Start tracking and journaling dietary intake a few days a week. - Focus on a 1200 calorie diet with high protein intake (90-120 grams per day). - Use meal planning tools like ChatGPT for creating a 7-14 day meal plan. - Incorporate a protein shake as a meal replacement for convenience. - Consider prepackaged meals from Whole Foods or Long Life Me[employer]  Prep. - Explore exercise options such as chair exercises or pool programs at [place] or [facility]. - Check insurance for discounts on fitness club fees. Weight gain due to medication She is on several obesogenic medications may benefit from metformin to offset weight gain associated with these medications in addition to nutrition and behavioral strategies. Prediabetes Most recent A1c  is  Lab Results  Component Value Date   HGBA1C 5.8 (H) 10/27/2023   HGBA1C 5.5 12/06/2020    Patient aware of disease state and risk of progression. This may contribute to abnormal cravings, fatigue and diabetic complications without having diabetes.   We have discussed treatment options which include: losing 7 to 10% of body weight, increasing physical activity to a goal of 150 minutes a week at moderate intensity.  Advised to maintain a diet low on simple and processed carbohydrates.  Increase metformin XR to 1 tablet twice a day Relapsing remitting multiple sclerosis Multiple sclerosis with symptoms of lower body weakness and occasional hand weakness. Exercise is important for strengthening, but weakness limits activity. Consideration of exercise programs that accommodate physical limitations. - Explore chair exercises on YouTube for supported lower body workouts. - Consider pool programs at [facility] or [facility] for low-impact exercise.         Objective   Physical Exam:  Blood pressure 109/76, pulse 99, temperature 98.6 F (37 C), height 5' 3 (1.6 m), weight 231 lb (104.8 kg), SpO2 98%. Body mass index is 40.92 kg/m.  General: She is overweight, cooperative, alert, well developed, and in no acute distress. PSYCH: Has normal mood, affect and thought process.   HEENT: EOMI, sclerae are anicteric. Lungs: Normal breathing effort, no conversational dyspnea. Extremities: No edema.  Neurologic: No gross sensory or motor deficits. No tremors or fasciculations noted.    Diagnostic Data Reviewed:  BMET    Component Value Date/Time   NA 141 10/27/2023 0920   NA 141 07/28/2013 0410   K 4.1 10/27/2023 0920   K 3.7 07/28/2013 0410   CL 101 10/27/2023 0920   CL 103 07/28/2013 0410   CO2 22 10/27/2023 0920   CO2 29 07/28/2013 0410   GLUCOSE 82 10/27/2023 0920   GLUCOSE 93 07/26/2023 1502   GLUCOSE 121 (H) 07/28/2013 0410   BUN 12 10/27/2023 0920   BUN 16 07/28/2013 0410    CREATININE 0.87 10/27/2023 0920   CREATININE 0.66 07/28/2013 0410   CALCIUM 9.6 10/27/2023 0920   CALCIUM 8.6 07/28/2013 0410   GFRNONAA >60 07/26/2023 1502   GFRNONAA >60 07/28/2013 0410   GFRAA 108 12/06/2019 0955   GFRAA >60 07/28/2013 0410   Lab Results  Component Value Date   HGBA1C 5.8 (H) 10/27/2023   HGBA1C 5.5 12/06/2020   Lab Results  Component Value Date   INSULIN  23.7 10/27/2023   Lab Results  Component Value Date   TSH 1.840 10/27/2023   CBC    Component Value Date/Time   WBC 8.6 10/27/2023 0920   WBC 11.3 (H) 07/26/2023 1502   RBC 5.17 10/27/2023 0920   RBC 5.37 (H) 07/26/2023 1502   HGB 13.4 10/27/2023 0920   HCT 41.3 10/27/2023 0920   PLT 419 10/27/2023 0920   MCV 80 10/27/2023 0920   MCV 88 07/28/2013 0410   MCH 25.9 (L) 10/27/2023 0920   MCH 25.3 (L) 07/26/2023 1502   MCHC 32.4 10/27/2023 0920   MCHC 32.4 07/26/2023 1502   RDW 15.1 10/27/2023 0920   RDW 13.4 07/28/2013 0410   Iron Studies  Component Value Date/Time   IRON 43 10/27/2023 0920   TIBC 473 (H) 10/27/2023 0920   FERRITIN 31 10/27/2023 0920   IRONPCTSAT 9 (LL) 10/27/2023 0920   Lipid Panel     Component Value Date/Time   CHOL 226 (H) 10/27/2023 0920   TRIG 141 10/27/2023 0920   HDL 63 10/27/2023 0920   CHOLHDL 2.6 12/02/2017 1018   LDLCALC 138 (H) 10/27/2023 0920   Hepatic Function Panel     Component Value Date/Time   PROT 7.0 10/27/2023 0920   PROT 6.7 07/25/2013 0410   ALBUMIN 4.6 10/27/2023 0920   ALBUMIN 3.3 (L) 07/25/2013 0410   AST 15 10/27/2023 0920   AST 20 07/25/2013 0410   ALT 22 10/27/2023 0920   ALT 18 07/25/2013 0410   ALKPHOS 141 (H) 10/27/2023 0920   ALKPHOS 49 07/25/2013 0410   BILITOT 0.3 10/27/2023 0920   BILITOT 0.4 07/25/2013 0410   BILIDIR 0.10 02/18/2018 0911      Component Value Date/Time   TSH 1.840 10/27/2023 0920   Nutritional Lab Results  Component Value Date   VD25OH 46.8 10/27/2023   VD25OH 59.2 12/18/2022   VD25OH 56.3  06/18/2022    Medications: Outpatient Encounter Medications as of 12/01/2023  Medication Sig Note   albuterol  (VENTOLIN  HFA) 108 (90 Base) MCG/ACT inhaler Inhale 2 puffs into the lungs every 6 (six) hours as needed for wheezing or shortness of breath. 08/15/2023: Last time used was about a year ago pt stated 08/15/23   amphetamine -dextroamphetamine  (ADDERALL  XR) 30 MG 24 hr capsule Take 1 capsule (30 mg total) by mouth daily.    baclofen  (LIORESAL ) 10 MG tablet Take 1-3 tablets (10-30 mg total) by mouth See admin instructions. Take 10 mg in the morning and 30 mg at bedtime    buPROPion  (WELLBUTRIN  XL) 300 MG 24 hr tablet Take 1 tablet (300 mg total) by mouth daily.    Cholecalciferol 125 MCG (5000 UT) TABS Take 5,000 Units by mouth daily.    ferrous sulfate 325 (65 FE) MG tablet Take 1 tablet (325 mg total) by mouth every other day.    fluticasone  (FLONASE ) 50 MCG/ACT nasal spray Place 1 spray into both nostrils daily as needed for allergies or rhinitis.    meclizine  (ANTIVERT ) 25 MG tablet Take 1 tablet (25 mg total) by mouth 3 (three) times daily as needed for dizziness.    ocrelizumab 600 mg in sodium chloride  0.9 % 500 mL Inject 600 mg into the vein every 6 (six) months. 08/15/2023: Last time taken was on 04/04/23 pt stated 08/15/23   propranolol  (INDERAL ) 10 MG tablet Take 1 tablet (10 mg total) by mouth daily as needed. For severe anxiety attack    sertraline  (ZOLOFT ) 100 MG tablet Take 2 tablets (200 mg total) by mouth every morning.    traZODone  (DESYREL ) 100 MG tablet Take 1 tablet (100 mg total) by mouth at bedtime.    [DISCONTINUED] metFORMIN (GLUCOPHAGE-XR) 500 MG 24 hr tablet Take 1 tablet (500 mg total) by mouth daily with breakfast.    acetaminophen  (TYLENOL ) 500 MG tablet Take 1,000 mg by mouth every 6 (six) hours as needed for moderate pain (pain score 4-6). (Patient not taking: Reported on 12/01/2023)    metFORMIN (GLUCOPHAGE-XR) 500 MG 24 hr tablet Take 1 tablet (500 mg total) by  mouth 2 (two) times daily with a meal.    No facility-administered encounter medications on file as of 12/01/2023.     Follow-Up   Return in about  3 weeks (around 12/22/2023) for For Weight Mangement with Dr. Francyne.SABRA She was informed of the importance of frequent follow up visits to maximize her success with intensive lifestyle modifications for her multiple health conditions.  Attestation Statement   Reviewed by clinician on day of visit: allergies, medications, problem list, medical history, surgical history, family history, social history, and previous encounter notes.     Lucas Francyne, MD

## 2023-12-01 NOTE — Assessment & Plan Note (Signed)
 Obesity management is in the initiation phase, focusing on dietary adjustments and calorie tracking.  - Increase metformin to twice daily, one with breakfast and one with dinner. - Start tracking and journaling dietary intake a few days a week. - Focus on a 1200 calorie diet with high protein intake (90-120 grams per day). - Use meal planning tools like ChatGPT for creating a 7-14 day meal plan. - Incorporate a protein shake as a meal replacement for convenience. - Consider prepackaged meals from Whole Foods or [employer] Life Meal Prep. - Explore exercise options such as chair exercises or pool programs at [place] or [facility]. - Check insurance for discounts on fitness club fees.

## 2023-12-01 NOTE — Assessment & Plan Note (Signed)
 Multiple sclerosis with symptoms of lower body weakness and occasional hand weakness. Exercise is important for strengthening, but weakness limits activity. Consideration of exercise programs that accommodate physical limitations. - Explore chair exercises on YouTube for supported lower body workouts. - Consider pool programs at [facility] or [facility] for low-impact exercise.

## 2023-12-01 NOTE — Assessment & Plan Note (Signed)
 Her HOMA-IR is 4.65 which is elevated. Optimal level < 1.9.   This is complex condition associated with genetics, ectopic fat and lifestyle factors. Insulin  resistance may result in increased fat storage, inhibition of the breakdown of fat, cause fluctuations in blood sugar leading to energy crashes and increased cravings for sugary or high carb foods and cause metabolic slowdown making it difficult to lose weight.  This may result in additional weight gain and lead to pre-diabetes and diabetes if untreated. In addition, hyperinsulinemia increases cardiovascular risk, chronic inflammatory response and may increase the risk of obesity related malignancies.  Lab Results  Component Value Date   HGBA1C 5.8 (H) 10/27/2023   Lab Results  Component Value Date   INSULIN  23.7 10/27/2023   Lab Results  Component Value Date   GLUCOSE 82 10/27/2023   GLUCOSE 89 07/23/2013    We reviewed treatment options which include losing 7 to 10% of body weight, increasing volume of physical activity and maintaining a diet low in saturated fats and with a low glycemic load.  Patient has also been educated on the carb insulin  model of obesity.  She is now on metformin XR medication will be increased to twice a day for pharmacoprophylaxis

## 2023-12-23 ENCOUNTER — Encounter: Payer: Self-pay | Admitting: Neurology

## 2023-12-23 ENCOUNTER — Other Ambulatory Visit: Payer: Self-pay | Admitting: Nurse Practitioner

## 2023-12-23 MED ORDER — AMPHETAMINE-DEXTROAMPHET ER 30 MG PO CP24: 30.0000 mg | ORAL_CAPSULE | Freq: Every day | ORAL | 0 refills | Status: DC

## 2023-12-23 NOTE — Telephone Encounter (Signed)
 Last seen on 10/01/23 Follow up scheduled on 02/24/24   Dispensed Days Supply Quantity Provider Pharmacy  AMPHETAMINE -DEXTROAMPHET ER 30 MG CAP 11/20/2023 30 30 each Sater, Charlie LABOR, MD [facility] - ...     Rx pending to be signed

## 2023-12-26 NOTE — Telephone Encounter (Signed)
 Requested Prescriptions  Pending Prescriptions Disp Refills   sertraline  (ZOLOFT ) 100 MG tablet [Pharmacy Med Name: SERTRALINE  HCL 100 MG TAB] 180 tablet 0    Sig: TAKE TWO TABLETS BY MOUTH ONCE DAILY     Psychiatry:  Antidepressants - SSRI - sertraline  Passed - 12/26/2023  9:09 AM      Passed - AST in normal range and within 360 days    AST  Date Value Ref Range Status  10/27/2023 15 0 - 40 IU/L Final   SGOT(AST)  Date Value Ref Range Status  07/25/2013 20 15 - 37 Unit/L Final         Passed - ALT in normal range and within 360 days    ALT  Date Value Ref Range Status  10/27/2023 22 0 - 32 IU/L Final   SGPT (ALT)  Date Value Ref Range Status  07/25/2013 18 12 - 78 U/L Final         Passed - Completed PHQ-2 or PHQ-9 in the last 360 days      Passed - Valid encounter within last 6 months    Recent Outpatient Visits           2 months ago 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 T, NP   5 months ago Relapsing remitting multiple sclerosis   [place] [facility] [place], [place] T, NP               buPROPion  (WELLBUTRIN  XL) 150 MG 24 hr tablet [Pharmacy Med Name: BUPROPION  HCL ER (XL) 150 MG TAB] 90 tablet 0    Sig: TAKE ONE TABLET BY MOUTH ONCE DAILY     Psychiatry: Antidepressants - bupropion  Passed - 12/26/2023  9:09 AM      Passed - Cr in normal range and within 360 days    Creatinine  Date Value Ref Range Status  07/28/2013 0.66 0.60 - 1.30 mg/dL Final   Creatinine, Ser  Date Value Ref Range Status  10/27/2023 0.87 0.57 - 1.00 mg/dL Final         Passed - AST in normal range and within 360 days    AST  Date Value Ref Range Status  10/27/2023 15 0 - 40 IU/L Final   SGOT(AST)  Date Value Ref Range Status  07/25/2013 20 15 -  37 Unit/L Final         Passed - ALT in normal range and within 360 days    ALT  Date Value Ref Range Status  10/27/2023 22 0 - 32 IU/L Final   SGPT (ALT)  Date Value Ref Range Status  07/25/2013 18 12 - 78 U/L Final         Passed - Last BP in normal range    BP Readings from Last 1 Encounters:  12/01/23 109/76         Passed - Valid encounter within last 6 months    Recent Outpatient Visits           2 months ago 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

## 2023-12-28 NOTE — Patient Instructions (Incomplete)
 Be Involved in Caring For Your Health:  Taking Medications When medications are taken as directed, they can greatly improve your health. But if they are not taken as prescribed, they may not work. In some cases, not taking them correctly can be harmful. To help ensure your treatment remains effective and safe, understand your medications and how to take them. Bring your medications to each visit for review by your provider.  Your lab results, notes, and after visit summary will be available on My Chart. We strongly encourage you to use this feature. If lab results are abnormal the clinic will contact you with the appropriate steps. If the clinic does not contact you assume the results are satisfactory. You can always view your results on My Chart. If you have questions regarding your health or results, please contact the clinic during office hours. You can also ask questions on My Chart.  We at [facility] are grateful that you chose us  to provide your care. We strive to provide evidence-based and compassionate care and are always looking for feedback. If you get a survey from the clinic please complete this so we can hear your opinions.  Healthy Eating, Adult Healthy eating may help you get and keep a healthy body weight, reduce the risk of chronic disease, and live a long and productive life. It is important to follow a healthy eating pattern. Your nutritional and calorie needs should be met mainly by different nutrient-rich foods. What are tips for following this plan? Reading food labels Read labels and choose the following: Reduced or low sodium products. Juices with 100% fruit juice. Foods with low saturated fats (<3 g per serving) and high polyunsaturated and monounsaturated fats. Foods with whole grains, such as whole wheat, cracked wheat, brown rice, and wild rice. Whole grains that are fortified with folic acid. This is recommended for females who are pregnant or who want to  become pregnant. Read labels and do not eat or drink the following: Foods or drinks with added sugars. These include foods that contain brown sugar, corn sweetener, corn syrup, dextrose , fructose, glucose, high-fructose corn syrup, honey, invert sugar, lactose, malt syrup, maltose, molasses, raw sugar, sucrose, trehalose, or turbinado sugar. Limit your intake of added sugars to less than 10% of your total daily calories. Do not eat more than the following amounts of added sugar per day: 6 teaspoons (25 g) for females. 9 teaspoons (38 g) for males. Foods that contain processed or refined starches and grains. Refined grain products, such as white flour, degermed cornmeal, white bread, and white rice. Shopping Choose nutrient-rich snacks, such as vegetables, whole fruits, and nuts. Avoid high-calorie and high-sugar snacks, such as potato chips, fruit snacks, and candy. Use oil-based dressings and spreads on foods instead of solid fats such as butter, margarine, sour cream, or cream cheese. Limit pre-made sauces, mixes, and instant products such as flavored rice, instant noodles, and ready-made pasta. Try more plant-protein sources, such as tofu, tempeh, black beans, edamame, lentils, nuts, and seeds. Explore eating plans such as the Mediterranean diet or vegetarian diet. Try heart-healthy dips made with beans and healthy fats like hummus and guacamole. Vegetables go great with these. Cooking Use oil to saut or stir-fry foods instead of solid fats such as butter, margarine, or lard. Try baking, boiling, grilling, or broiling instead of frying. Remove the fatty part of meats before cooking. Steam vegetables in water  or broth. Meal planning  At meals, imagine dividing your plate into fourths: One-half of  your plate is fruits and vegetables. One-fourth of your plate is whole grains. One-fourth of your plate is protein, especially lean meats, poultry, eggs, tofu, beans, or nuts. Include low-fat  dairy as part of your daily diet. Lifestyle Choose healthy options in all settings, including home, work, school, restaurants, or stores. Prepare your food safely: Wash your hands after handling raw meats. Where you prepare food, keep surfaces clean by regularly washing with hot, soapy water . Keep raw meats separate from ready-to-eat foods, such as fruits and vegetables. Cook seafood, meat, poultry, and eggs to the recommended temperature. Get a food thermometer. Store foods at safe temperatures. In general: Keep cold foods at 84F (4.4C) or below. Keep hot foods at 184F (60C) or above. Keep your freezer at [facility] (-17.8C) or below. Foods are not safe to eat if they have been between the temperatures of 40-184F (4.4-60C) for more than 2 hours. What foods should I eat? Fruits Aim to eat 1-2 cups of fresh, canned (in natural juice), or frozen fruits each day. One cup of fruit equals 1 small apple, 1 large banana, 8 large strawberries, 1 cup (237 g) canned fruit,  cup (82 g) dried fruit, or 1 cup (240 mL) 100% juice. Vegetables Aim to eat 2-4 cups of fresh and frozen vegetables each day, including different varieties and colors. One cup of vegetables equals 1 cup (91 g) broccoli or cauliflower florets, 2 medium carrots, 2 cups (150 g) raw, leafy greens, 1 large tomato, 1 large bell pepper, 1 large sweet potato, or 1 medium white potato. Grains Aim to eat 5-10 ounce-equivalents of whole grains each day. Examples of 1 ounce-equivalent of grains include 1 slice of bread, 1 cup (40 g) ready-to-eat cereal, 3 cups (24 g) popcorn, or  cup (93 g) cooked rice. Meats and other proteins Try to eat 5-7 ounce-equivalents of protein each day. Examples of 1 ounce-equivalent of protein include 1 egg,  oz nuts (12 almonds, 24 pistachios, or 7 walnut halves), 1/4 cup (90 g) cooked beans, 6 tablespoons (90 g) hummus or 1 tablespoon (16 g) peanut butter. A cut of meat or fish that is the size of a deck of  cards is about 3-4 ounce-equivalents (85 g). Of the protein you eat each week, try to have at least 8 sounce (227 g) of seafood. This is about 2 servings per week. This includes salmon, trout, herring, sardines, and anchovies. Dairy Aim to eat 3 cup-equivalents of fat-free or low-fat dairy each day. Examples of 1 cup-equivalent of dairy include 1 cup (240 mL) milk, 8 ounces (250 g) yogurt, 1 ounces (44 g) natural cheese, or 1 cup (240 mL) fortified soy milk. Fats and oils Aim for about 5 teaspoons (21 g) of fats and oils per day. Choose monounsaturated fats, such as canola and olive oils, mayonnaise made with olive oil or avocado oil, avocados, peanut butter, and most nuts, or polyunsaturated fats, such as sunflower, corn, and soybean oils, walnuts, pine nuts, sesame seeds, sunflower seeds, and flaxseed. Beverages Aim for 6 eight-ounce glasses of water  per day. Limit coffee to 3-5 eight-ounce cups per day. Limit caffeinated beverages that have added calories, such as soda and energy drinks. If you drink alcohol: Limit how much you have to: 0-1 drink a day if you are female. 0-2 drinks a day if you are female. Know how much alcohol is in your drink. In the [place] one drink is one 12 oz bottle of beer (355 mL), one 5 oz glass of wine (  148 mL), or one 1 oz glass of hard liquor (44 mL). Seasoning and other foods Try not to add too much salt to your food. Try using herbs and spices instead of salt. Try not to add sugar to food. This information is based on [place] nutrition guidelines. To learn more, visit [employer].be. Exact amounts may vary. You may need different amounts. This information is not intended to replace advice given to you by your health care provider. Make sure you discuss any questions you have with your health care provider. Document Revised: 10/22/2021 Document Reviewed: 10/22/2021 Elsevier Patient Education  2024 [employer].

## 2023-12-30 ENCOUNTER — Encounter: Admitting: Nurse Practitioner

## 2023-12-30 DIAGNOSIS — F411 Generalized anxiety disorder: Secondary | ICD-10-CM

## 2023-12-30 DIAGNOSIS — R635 Abnormal weight gain: Secondary | ICD-10-CM

## 2023-12-30 DIAGNOSIS — G35A Relapsing-remitting multiple sclerosis: Secondary | ICD-10-CM

## 2023-12-30 DIAGNOSIS — F5105 Insomnia due to other mental disorder: Secondary | ICD-10-CM

## 2023-12-30 DIAGNOSIS — R7303 Prediabetes: Secondary | ICD-10-CM

## 2023-12-30 DIAGNOSIS — Z0000 Encounter for general adult medical examination without abnormal findings: Secondary | ICD-10-CM

## 2023-12-30 DIAGNOSIS — G43009 Migraine without aura, not intractable, without status migrainosus: Secondary | ICD-10-CM

## 2023-12-30 DIAGNOSIS — F0632 Mood disorder due to known physiological condition with major depressive-like episode: Secondary | ICD-10-CM

## 2024-01-03 ENCOUNTER — Encounter: Payer: Self-pay | Admitting: Nurse Practitioner

## 2024-01-06 ENCOUNTER — Ambulatory Visit (INDEPENDENT_AMBULATORY_CARE_PROVIDER_SITE_OTHER): Payer: Self-pay | Admitting: Internal Medicine

## 2024-01-06 ENCOUNTER — Encounter (INDEPENDENT_AMBULATORY_CARE_PROVIDER_SITE_OTHER): Payer: Self-pay | Admitting: Internal Medicine

## 2024-01-06 VITALS — BP 119/85 | HR 117 | Temp 98.0°F | Ht 63.0 in | Wt 223.0 lb

## 2024-01-06 DIAGNOSIS — Z6841 Body Mass Index (BMI) 40.0 and over, adult: Secondary | ICD-10-CM

## 2024-01-06 DIAGNOSIS — R5383 Other fatigue: Secondary | ICD-10-CM

## 2024-01-06 DIAGNOSIS — E66813 Obesity, class 3: Secondary | ICD-10-CM

## 2024-01-06 DIAGNOSIS — R635 Abnormal weight gain: Secondary | ICD-10-CM

## 2024-01-06 DIAGNOSIS — R7303 Prediabetes: Secondary | ICD-10-CM

## 2024-01-06 DIAGNOSIS — R000 Tachycardia, unspecified: Secondary | ICD-10-CM

## 2024-01-06 DIAGNOSIS — T50905S Adverse effect of unspecified drugs, medicaments and biological substances, sequela: Secondary | ICD-10-CM | POA: Diagnosis not present

## 2024-01-06 MED ORDER — METFORMIN HCL ER 500 MG PO TB24: 500.0000 mg | ORAL_TABLET | Freq: Two times a day (BID) | ORAL | 0 refills | Status: DC

## 2024-01-06 NOTE — Progress Notes (Unsigned)
 Office: [phone]  /  Fax: [phone]  Weight Summary and Body Composition Analysis (BIA)  Vitals Temp: 98 F (36.7 C) BP: 119/85 Pulse Rate: (!) 117 SpO2: 100 %   Anthropometric Measurements Height: 5' 3 (1.6 m) Weight: 223 lb (101.2 kg) BMI (Calculated): 39.51 Weight at Last Visit: 231 lb Weight Lost Since Last Visit: 8 lb Weight Gained Since Last Visit: 0 lb Starting Weight: 232 lb Total Weight Loss (lbs): 9 lb (4.082 kg) Peak Weight: 233 lb   Body Composition  Body Fat %: 44.4 % Fat Mass (lbs): 99.4 lbs Muscle Mass (lbs): 118.2 lbs Total Body Water (lbs): 83 lbs Visceral Fat Rating : 11    RMR: 1800  Today's Visit #: 4  Starting Date: 10/27/23   Subjective   Chief Complaint: Obesity  Interval History Discussed the use of AI scribe software for clinical note transcription with the patient, who gave verbal consent to proceed.  History of Present Illness [name] is a 43 year old female with multiple sclerosis who presents for weight management.  She has successfully lost eight pounds by increasing water intake and reducing diet soda consumption, specifically Doctor Pepper Zero. She finds that drinking from cans rather than bottles helps limit her intake. Despite these dietary improvements, she continues to eat out frequently but opts for grilled options and salads, avoiding fried foods.  She experiences gastrointestinal upset, which she associates with metformin , particularly when taken twice daily. She is working on dietary adjustments to mitigate these effects.  She has noticed an elevated heart rate, with a recent measurement of 117 bpm. She has not taken Wellbutrin  since mid-November due to prescription issues and reports increased depressive symptoms and fatigue. 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  She is currently on Adderall  for MS-related  fatigue and ADD, but experiences swings in energy levels and emotions, which she attributes to her medication regimen. She takes propranolol  infrequently for panic attacks and baclofen  daily for muscle spasms, noting that missing doses leads to increased leg spasms.  Her current medications include Zoloft  and trazodone , which she believes contribute to her fatigue and lack of motivation. She is concerned about the long-term use of these medications and their potential side effects, including SSRI fatigue.  She reports that her largest lesion is on her C spine and that she has been told this is the biggest contributor to her fatigue. She is also considering the role of her medications in her current symptoms.     Challenges affecting patient progress: medical comorbidities.    Pharmacotherapy for weight management: She is currently taking Metformin  (off label use for weight management and / or insulin  resistance and / or diabetes prevention) with adequate clinical response  and without side effects..   Assessment and Plan   Treatment Plan For Obesity:  Recommended Dietary Goals  [name] is currently in the action stage of change. As such, her goal is to continue weight management plan. She has agreed to: continue current plan  Behavioral Health and Counseling  We discussed the following behavioral modification strategies today: continue to work on maintaining a reduced calorie state, getting the recommended amount of protein, incorporating whole foods, making healthy choices, staying well hydrated and practicing mindfulness when eating. and increase protein intake, fibrous foods (25 grams per day for women, 30 grams for men) and water to improve satiety and decrease hunger signals. .  Additional education and  resources provided today: None  Recommended Physical Activity Goals  [name] has been advised to work up to 150 minutes of moderate intensity aerobic activity a week and  strengthening exercises 2-3 times per week for cardiovascular health, weight loss maintenance and preservation of muscle mass.  She has agreed to :  Think about enjoyable ways to increase daily physical activity and overcoming barriers to exercise, Increase physical activity in their day and reduce sedentary time (increase NEAT)., Increase volume of physical activity to a goal of 240 minutes a week, and Combine aerobic and strengthening exercises for efficiency and improved cardiometabolic health.  Medical Interventions and Pharmacotherapy  We discussed various medication options to help [name] with her weight loss efforts and we both agreed to : Adequate clinical response to anti-obesity medication, continue current regimen  Associated Conditions Impacted by Obesity Treatment  Assessment & Plan Class 3 severe obesity with serious comorbidity and body mass index (BMI) of 40.0 to 44.9 in adult, unspecified obesity type (HCC) See obesity treatment plan Weight gain due to medication She is on several obesogenic drugs.  She has been started on metformin  to offset weight gain associated with some of these medications.  She also has prediabetes. Prediabetes Most recent A1c is  Lab Results  Component Value Date   HGBA1C 5.8 (H) 10/27/2023   HGBA1C 5.5 12/06/2020    Patient aware of disease state and risk of progression. This may contribute to abnormal cravings, fatigue and diabetic complications without having diabetes.   We have discussed treatment options which include: losing 7 to 10% of body weight, increasing physical activity to a goal of 150 minutes a week at moderate intensity.  Advised to maintain a diet low on simple and processed carbohydrates.  Increase metformin  XR to 1 tablet twice a day Tachycardia She has experienced elevated heart rate, with a recent reading of 117 bpm. This may be related to the use of Adderall  and Wellbutrin , although she has not taken Wellbutrin  since  mid-November due to a prescription issue. The elevated heart rate could be exacerbated by stress and anxiety, particularly in her work environment. There is a concern about the potential for cardiac changes if the heart rate remains elevated for prolonged periods. - Monitor heart rate using a pulse oximeter  - Discuss use of Adderall  with prescribing physician - Avoid caffeine and energy drinks. -Report to the emergency room if she experiences any chest pains or other concerning symptoms. Other fatigue Multifactorial.She reports increased depressive symptoms, including lack of energy and motivation, potentially exacerbated by current medications. She is considering consulting with psychiatry to evaluate and adjust her medication regimen. There is a concern about the potential for SSRI fatigue and the impact of anticholinergic medications on her symptoms. - She will be working on finding a psychiatrist to evaluate pharmacotherapy.          Objective   Physical Exam:  Blood pressure 119/85, pulse (!) 117, temperature 98 F (36.7 C), height 5' 3 (1.6 m), weight 223 lb (101.2 kg), SpO2 100%. Body mass index is 39.5 kg/m.  General: She is overweight, cooperative, alert, well developed, and in no acute distress. PSYCH: Has normal mood, affect and thought process.   HEENT: EOMI, sclerae are anicteric. Lungs: Normal breathing effort, no conversational dyspnea. Extremities: No edema.  Neurologic: No gross sensory or motor deficits. No tremors or fasciculations noted.    Diagnostic Data Reviewed:  BMET    Component Value Date/Time   NA 141 10/27/2023 0920  NA 141 07/28/2013 0410   K 4.1 10/27/2023 0920   K 3.7 07/28/2013 0410   CL 101 10/27/2023 0920   CL 103 07/28/2013 0410   CO2 22 10/27/2023 0920   CO2 29 07/28/2013 0410   GLUCOSE 82 10/27/2023 0920   GLUCOSE 93 07/26/2023 1502   GLUCOSE 121 (H) 07/28/2013 0410   BUN 12 10/27/2023 0920   BUN 16 07/28/2013 0410   CREATININE  0.87 10/27/2023 0920   CREATININE 0.66 07/28/2013 0410   CALCIUM 9.6 10/27/2023 0920   CALCIUM 8.6 07/28/2013 0410   GFRNONAA >60 07/26/2023 1502   GFRNONAA >60 07/28/2013 0410   GFRAA 108 12/06/2019 0955   GFRAA >60 07/28/2013 0410   Lab Results  Component Value Date   HGBA1C 5.8 (H) 10/27/2023   HGBA1C 5.5 12/06/2020   Lab Results  Component Value Date   INSULIN  23.7 10/27/2023   Lab Results  Component Value Date   TSH 1.840 10/27/2023   CBC    Component Value Date/Time   WBC 8.6 10/27/2023 0920   WBC 11.3 (H) 07/26/2023 1502   RBC 5.17 10/27/2023 0920   RBC 5.37 (H) 07/26/2023 1502   HGB 13.4 10/27/2023 0920   HCT 41.3 10/27/2023 0920   PLT 419 10/27/2023 0920   MCV 80 10/27/2023 0920   MCV 88 07/28/2013 0410   MCH 25.9 (L) 10/27/2023 0920   MCH 25.3 (L) 07/26/2023 1502   MCHC 32.4 10/27/2023 0920   MCHC 32.4 07/26/2023 1502   RDW 15.1 10/27/2023 0920   RDW 13.4 07/28/2013 0410   Iron Studies    Component Value Date/Time   IRON 43 10/27/2023 0920   TIBC 473 (H) 10/27/2023 0920   FERRITIN 31 10/27/2023 0920   IRONPCTSAT 9 (LL) 10/27/2023 0920   Lipid Panel     Component Value Date/Time   CHOL 226 (H) 10/27/2023 0920   TRIG 141 10/27/2023 0920   HDL 63 10/27/2023 0920   CHOLHDL 2.6 12/02/2017 1018   LDLCALC 138 (H) 10/27/2023 0920   Hepatic Function Panel     Component Value Date/Time   PROT 7.0 10/27/2023 0920   PROT 6.7 07/25/2013 0410   ALBUMIN 4.6 10/27/2023 0920   ALBUMIN 3.3 (L) 07/25/2013 0410   AST 15 10/27/2023 0920   AST 20 07/25/2013 0410   ALT 22 10/27/2023 0920   ALT 18 07/25/2013 0410   ALKPHOS 141 (H) 10/27/2023 0920   ALKPHOS 49 07/25/2013 0410   BILITOT 0.3 10/27/2023 0920   BILITOT 0.4 07/25/2013 0410   BILIDIR 0.10 02/18/2018 0911      Component Value Date/Time   TSH 1.840 10/27/2023 0920   Nutritional Lab Results  Component Value Date   VD25OH 46.8 10/27/2023   VD25OH 59.2 12/18/2022   VD25OH 56.3 06/18/2022     Medications: Outpatient Encounter Medications as of 01/06/2024  Medication Sig Note   albuterol  (VENTOLIN  HFA) 108 (90 Base) MCG/ACT inhaler Inhale 2 puffs into the lungs every 6 (six) hours as needed for wheezing or shortness of breath. 08/15/2023: Last time used was about a year ago pt stated 08/15/23   amphetamine -dextroamphetamine  (ADDERALL  XR) 30 MG 24 hr capsule Take 1 capsule (30 mg total) by mouth daily.    baclofen  (LIORESAL ) 10 MG tablet Take 1-3 tablets (10-30 mg total) by mouth See admin instructions. Take 10 mg in the morning and 30 mg at bedtime    buPROPion  (WELLBUTRIN  XL) 300 MG 24 hr tablet Take 1 tablet (300 mg total) by mouth  daily.    Cholecalciferol 125 MCG (5000 UT) TABS Take 5,000 Units by mouth daily.    ferrous sulfate  325 (65 FE) MG tablet Take 1 tablet (325 mg total) by mouth every other day.    fluticasone  (FLONASE ) 50 MCG/ACT nasal spray Place 1 spray into both nostrils daily as needed for allergies or rhinitis.    meclizine  (ANTIVERT ) 25 MG tablet Take 1 tablet (25 mg total) by mouth 3 (three) times daily as needed for dizziness.    ocrelizumab 600 mg in sodium chloride  0.9 % 500 mL Inject 600 mg into the vein every 6 (six) months. 08/15/2023: Last time taken was on 04/04/23 pt stated 08/15/23   propranolol  (INDERAL ) 10 MG tablet Take 1 tablet (10 mg total) by mouth daily as needed. For severe anxiety attack    sertraline  (ZOLOFT ) 100 MG tablet TAKE TWO TABLETS BY MOUTH ONCE DAILY    traZODone  (DESYREL ) 100 MG tablet Take 1 tablet (100 mg total) by mouth at bedtime.    [DISCONTINUED] metFORMIN  (GLUCOPHAGE -XR) 500 MG 24 hr tablet Take 1 tablet (500 mg total) by mouth 2 (two) times daily with a meal.    acetaminophen  (TYLENOL ) 500 MG tablet Take 1,000 mg by mouth every 6 (six) hours as needed for moderate pain (pain score 4-6). (Patient not taking: Reported on 01/06/2024)    metFORMIN  (GLUCOPHAGE -XR) 500 MG 24 hr tablet Take 1 tablet (500 mg total) by mouth 2 (two) times  daily with a meal.    No facility-administered encounter medications on file as of 01/06/2024.     Follow-Up   Return in about 4 weeks (around 02/03/2024) for For Weight Mangement with Dr. Francyne.SABRA She was informed of the importance of frequent follow up visits to maximize her success with intensive lifestyle modifications for her multiple health conditions.  Attestation Statement   Reviewed by clinician on day of visit: allergies, medications, problem list, medical history, surgical history, family history, social history, and previous encounter notes.     Lucas Francyne, MD

## 2024-01-07 DIAGNOSIS — R000 Tachycardia, unspecified: Secondary | ICD-10-CM | POA: Insufficient documentation

## 2024-01-07 NOTE — Assessment & Plan Note (Signed)
 She is on several obesogenic drugs.  She has been started on metformin  to offset weight gain associated with some of these medications.  She also has prediabetes.

## 2024-01-07 NOTE — Assessment & Plan Note (Signed)
 She has experienced elevated heart rate, with a recent reading of 117 bpm. This may be related to the use of Adderall  and Wellbutrin , although she has not taken Wellbutrin  since mid-November due to a prescription issue. The elevated heart rate could be exacerbated by stress and anxiety, particularly in her work environment. There is a concern about the potential for cardiac changes if the heart rate remains elevated for prolonged periods. - Monitor heart rate using a pulse oximeter  - Discuss use of Adderall  with prescribing physician - Avoid caffeine and energy drinks. -Report to the emergency room if she experiences any chest pains or other concerning symptoms.

## 2024-01-07 NOTE — Assessment & Plan Note (Signed)
 Multifactorial.She reports increased depressive symptoms, including lack of energy and motivation, potentially exacerbated by current medications. She is considering consulting with psychiatry to evaluate and adjust her medication regimen. There is a concern about the potential for SSRI fatigue and the impact of anticholinergic medications on her symptoms. - She will be working on finding a psychiatrist to evaluate pharmacotherapy.

## 2024-01-07 NOTE — Assessment & Plan Note (Signed)
 See obesity treatment plan

## 2024-01-07 NOTE — Assessment & Plan Note (Signed)
 Most recent A1c is  Lab Results  Component Value Date   HGBA1C 5.8 (H) 10/27/2023   HGBA1C 5.5 12/06/2020    Patient aware of disease state and risk of progression. This may contribute to abnormal cravings, fatigue and diabetic complications without having diabetes.   We have discussed treatment options which include: losing 7 to 10% of body weight, increasing physical activity to a goal of 150 minutes a week at moderate intensity.  Advised to maintain a diet low on simple and processed carbohydrates.  Increase metformin XR to 1 tablet twice a day

## 2024-01-09 ENCOUNTER — Encounter: Admitting: Nurse Practitioner

## 2024-01-20 ENCOUNTER — Encounter: Payer: Self-pay | Admitting: Nurse Practitioner

## 2024-01-23 ENCOUNTER — Encounter: Payer: Self-pay | Admitting: Neurology

## 2024-01-26 MED ORDER — AMPHETAMINE-DEXTROAMPHET ER 30 MG PO CP24: 30.0000 mg | ORAL_CAPSULE | Freq: Every day | ORAL | 0 refills | Status: AC

## 2024-01-26 NOTE — Telephone Encounter (Signed)
 Requested Prescriptions   Pending Prescriptions Disp Refills   amphetamine -dextroamphetamine  (ADDERALL  XR) 30 MG 24 hr capsule 30 capsule 0    Sig: Take 1 capsule (30 mg total) by mouth daily.   Last seen 10/01/23 Next appt 02/24/24   Dispenses   Dispensed Days Supply Quantity Provider Pharmacy  AMPHET/DEXTR CAP 30MG  ER 12/23/2023 30 30 capsule Vear Charlie LABOR, MD [facility] - ...  AMPHETAMINE -DEXTROAMPHET ER 30 MG CAP 11/20/2023 30 30 each Sater, Charlie LABOR, MD [facility] - ...  AMPHET/DEXTR CAP 30MG  ER 10/18/2023 30 30 capsule Sater, Charlie LABOR, MD [facility] - ...  AMPHET/DEXTR CAP 30MG  ER 09/12/2023 30 30 capsule Camara, Amadou, MD [facility] - ...  AMPHET/DEXTR CAP 30MG  ER 08/06/2023 30 30 capsule Sater, Charlie LABOR, MD [facility] - ...  AMPHET/DEXTR CAP 30MG  ER 07/09/2023 30 30 capsule Sater, Charlie LABOR, MD [facility] - ...  AMPHET/DEXTR CAP 30MG  ER 06/04/2023 30 30 capsule Sater, Charlie LABOR, MD [facility] - ...  AMPHET/DEXTR CAP 30MG  ER 05/05/2023 30 30 capsule Sater, Charlie LABOR, MD [facility] - .SABRASABRA

## 2024-01-30 ENCOUNTER — Encounter: Admitting: Nurse Practitioner

## 2024-01-31 NOTE — Patient Instructions (Incomplete)

## 2024-02-03 ENCOUNTER — Ambulatory Visit (INDEPENDENT_AMBULATORY_CARE_PROVIDER_SITE_OTHER): Admitting: Nurse Practitioner

## 2024-02-03 VITALS — BP 116/70 | HR 98 | Temp 98.1°F | Resp 17 | Ht 64.17 in | Wt 225.2 lb

## 2024-02-03 DIAGNOSIS — G35A Relapsing-remitting multiple sclerosis: Secondary | ICD-10-CM | POA: Diagnosis not present

## 2024-02-03 DIAGNOSIS — G43009 Migraine without aura, not intractable, without status migrainosus: Secondary | ICD-10-CM | POA: Diagnosis not present

## 2024-02-03 DIAGNOSIS — F0632 Mood disorder due to known physiological condition with major depressive-like episode: Secondary | ICD-10-CM | POA: Diagnosis not present

## 2024-02-03 DIAGNOSIS — F411 Generalized anxiety disorder: Secondary | ICD-10-CM | POA: Diagnosis not present

## 2024-02-03 DIAGNOSIS — E6609 Other obesity due to excess calories: Secondary | ICD-10-CM | POA: Diagnosis not present

## 2024-02-03 DIAGNOSIS — Z6838 Body mass index (BMI) 38.0-38.9, adult: Secondary | ICD-10-CM

## 2024-02-03 DIAGNOSIS — Z0000 Encounter for general adult medical examination without abnormal findings: Secondary | ICD-10-CM | POA: Diagnosis not present

## 2024-02-03 DIAGNOSIS — E66812 Obesity, class 2: Secondary | ICD-10-CM

## 2024-02-03 DIAGNOSIS — F5105 Insomnia due to other mental disorder: Secondary | ICD-10-CM | POA: Diagnosis not present

## 2024-02-03 DIAGNOSIS — E7800 Pure hypercholesterolemia, unspecified: Secondary | ICD-10-CM | POA: Diagnosis not present

## 2024-02-03 DIAGNOSIS — R41840 Attention and concentration deficit: Secondary | ICD-10-CM

## 2024-02-03 NOTE — Assessment & Plan Note (Addendum)
 Chronic, ongoing secondary to MS.  Continue Trazodone  and collaboration with neurology. She is working on getting back into therapy, which would be beneficial. Could consider sleep study in future.

## 2024-02-03 NOTE — Assessment & Plan Note (Signed)
 Chronic, improving.  Denies SI/HI.  Followed by psychiatry in past, new referral placed today to get established with [facility]. Will continue Wellbutrin  XL 300 MG daily and maintain Zoloft  at current dose.  Continue Trazodone  for sleep and adjust as needed. Discussed with her that [facility] may be able to assist her at getting back into therapy, which would be beneficial for her.  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 Suspect Adderall  and Wellbutrin  are causing some elevations in HR, will defer to psychiatry any changes but suspect would benefit from discontinuation of Wellbutrin  and possibly change in ADHD medication.

## 2024-02-03 NOTE — Assessment & Plan Note (Signed)
 Refer to depression plan of care.

## 2024-02-03 NOTE — Progress Notes (Signed)
 "  BP 116/70 (BP Location: Left Arm, Patient Position: Sitting, Cuff Size: Large)   Pulse 98 Comment: apical  Temp 98.1 F (36.7 C) (Oral)   Resp 17   Ht 5' 4.17 (1.63 m)   Wt 225 lb 3.2 oz (102.2 kg)   SpO2 97%   BMI 38.45 kg/m    Subjective:    Patient ID: [name], female    DOB: Dec 04, 1980, 43 y.o.   MRN: [id number]  HPI: [name] is a 43 y.o. female presenting on 02/03/2024 for comprehensive medical examination. Current medical complaints include:none  She currently lives with: husband and children Menopausal Symptoms: no   Following with weight loss clinic in [place]. Last visit was 01/06/24, up to date on labs with them. She has cut way back on her diet sodas due to stomach upset with Metformin  on board. Does not feel as hungry with this on board.  The 10-year ASCVD risk score (Arnett DK, et al., 2019) is: 0.7%   Values used to calculate the score:     Age: 26 years     Clinically relevant sex: Female     Is Non-Hispanic African American: No     Diabetic: No     Tobacco smoker: No     Systolic Blood Pressure: 116 mmHg     Is BP treated: No     HDL Cholesterol: 63 mg/dL     Total Cholesterol: 226 mg/dL  MIGRAINES & MS Continues to follow with neurology for migraines and MS.  Last visit was 10/01/23.  Does infusions for MS, Ocrelizumab -- changing in future.  Her migraines are more frequent with weather changes, Topamax  was stopped by them months back. Has not been consistent with allergy medications. Treatments attempted: triptans and topamax    Aura: yes Nausea:  yes Vomiting: no Photophobia:  no Phonophobia:  no Effect on social functioning:  no Numbers of missed days of school/work each month: 1 time Confusion:  no Gait disturbance/ataxia:  no Behavioral changes:  no Fevers:  no    DEPRESSION Followed with psychiatry in past, Dr. Coby. Referral to Dr. Chipper placed in October, has not scheduled. 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 Taking Sertraline , Wellbutrin , Adderall , Trazodone  + Propranolol  as needed (does not often take). Adderall  is filled by neurology and she is thinking about asking for change in this due to elevated heart rate being noticed at times. Mood status: stable Satisfied with current treatment?: yes Symptom severity: mild  Duration of current treatment : chronic Side effects: no Medication compliance: good compliance Psychotherapy/counseling: yes in past Depressed mood: yes Anxious mood: not as much Anhedonia: a little bit, sometimes hard to motivate self Significant weight loss or gain: no Insomnia: yes, hard to stay asleep -- gets up to go to bathroom Fatigue: yes Feelings of worthlessness or guilt: no Impaired concentration/indecisiveness: yes Suicidal ideations: no Hopelessness: no Crying spells: no    02/03/2024    4:11 PM 10/27/2023    8:00 AM 10/20/2023    1:49 PM 07/28/2023    2:22 PM 07/04/2023    9:01 AM  Depression screen PHQ 2/9  Decreased Interest 2 0 1 1 1   Down, Depressed, Hopeless 1 1 1 1 1   PHQ - 2 Score 3 1 2 2 2   Altered sleeping 3 3 3 2 3   Tired, decreased energy 3 3 3 3 3   Change in appetite 2 2 3 1 2   Feeling bad or  failure about yourself  1 0 1 0 0  Trouble concentrating 2 2 3 2 3   Moving slowly or fidgety/restless 0 0 0 0 0  Suicidal thoughts 0 0 0 0 0  PHQ-9 Score 14 11  15  10  13    Difficult doing work/chores  Somewhat difficult  Somewhat difficult Very difficult     Data saved with a previous flowsheet row definition      02/03/2024    4:12 PM 10/20/2023    1:49 PM 07/28/2023    2:22 PM 07/04/2023    9:02 AM  GAD 7 : Generalized Anxiety Score  Nervous, Anxious, on Edge 2 2 1 1   Control/stop worrying 1 1 0 0  Worry too much - different things 1 1 1  0  Trouble relaxing 3 1 1 2   Restless 1 0 0 0  Easily annoyed or irritable 1 2 2 1   Afraid - awful might happen 0 0 1 0  Total GAD 7 Score 9 7 6 4   Anxiety  Difficulty  Somewhat difficult Somewhat difficult Somewhat difficult      12/18/2022    9:10 AM 02/04/2023   10:13 AM 07/04/2023    9:00 AM 10/20/2023    1:48 PM 02/03/2024    4:11 PM  Fall Risk  Falls in the past year? 1 1 1 1 1   Was there an injury with Fall? 0  0  0  0  0  Fall Risk Category Calculator 2 1 1 1 2   Patient at Risk for Falls Due to No Fall Risks No Fall Risks No Fall Risks No Fall Risks History of fall(s)  Fall risk Follow up Falls evaluation completed Falls evaluation completed Falls evaluation completed Falls evaluation completed Falls evaluation completed     Data saved with a previous flowsheet row definition    Past Medical History:  Past Medical History:  Diagnosis Date   ADD (attention deficit disorder)    Allergy    Anxiety    Back pain    Biliary colic    Depression    Edema of both lower extremities    Elevated LDL cholesterol level    GAD (generalized anxiety disorder)    GERD (gastroesophageal reflux disease)    Insomnia    Migraine    MS (multiple sclerosis)    Obesity    PONV (postoperative nausea and vomiting)    RBBB (right bundle branch block)    Rhinitis    Right foot drop    SOB (shortness of breath)    Spinal stenosis    Swallowing difficulty    Vitamin D  deficiency     Surgical History:  Past Surgical History:  Procedure Laterality Date   CHOLECYSTECTOMY     WISDOM TOOTH EXTRACTION      Medications:  Current Outpatient Medications on File Prior to Visit  Medication Sig   albuterol  (VENTOLIN  HFA) 108 (90 Base) MCG/ACT inhaler Inhale 2 puffs into the lungs every 6 (six) hours as needed for wheezing or shortness of breath.   amphetamine -dextroamphetamine  (ADDERALL  XR) 30 MG 24 hr capsule Take 1 capsule (30 mg total) by mouth daily.   baclofen  (LIORESAL ) 10 MG tablet Take 1-3 tablets (10-30 mg total) by mouth See admin instructions. Take 10 mg in the morning and 30 mg at bedtime   buPROPion  (WELLBUTRIN  XL) 300 MG 24 hr tablet  Take 1 tablet (300 mg total) by mouth daily.   Cholecalciferol 125 MCG (5000 UT) TABS Take 5,000  Units by mouth daily.   ferrous sulfate  325 (65 FE) MG tablet Take 1 tablet (325 mg total) by mouth every other day.   fluticasone  (FLONASE ) 50 MCG/ACT nasal spray Place 1 spray into both nostrils daily as needed for allergies or rhinitis.   meclizine  (ANTIVERT ) 25 MG tablet Take 1 tablet (25 mg total) by mouth 3 (three) times daily as needed for dizziness.   metFORMIN  (GLUCOPHAGE -XR) 500 MG 24 hr tablet Take 1 tablet (500 mg total) by mouth 2 (two) times daily with a meal.   ocrelizumab 600 mg in sodium chloride  0.9 % 500 mL Inject 600 mg into the vein every 6 (six) months.   propranolol  (INDERAL ) 10 MG tablet Take 1 tablet (10 mg total) by mouth daily as needed. For severe anxiety attack   sertraline  (ZOLOFT ) 100 MG tablet TAKE TWO TABLETS BY MOUTH ONCE DAILY   traZODone  (DESYREL ) 100 MG tablet Take 1 tablet (100 mg total) by mouth at bedtime.   No current facility-administered medications on file prior to visit.    Allergies:  Allergies  Allergen Reactions   Flexeril  [Cyclobenzaprine ] Other (See Comments)    Drowsy when taking during the day     Social History:  Social History   Socioeconomic History   Marital status: Married    Spouse name: corporate treasurer   Number of children: 3   Years of education: Not on file   Highest education level: Master's degree (e.g., MA, MS, MEng, MEd, MSW, MBA)  Occupational History   Occupation: Licensed Engineer, Petroleum and Music Therapist  Tobacco Use   Smoking status: Never   Smokeless tobacco: Never  Vaping Use   Vaping status: Never Used  Substance and Sexual Activity   Alcohol use: Yes    Alcohol/week: 1.0 standard drink of alcohol    Types: 1 Glasses of wine per week    Comment: rare wine   Drug use: Never   Sexual activity: Yes    Birth control/protection: I.U.D.  Other Topics Concern   Not on file  Social  History Narrative   Not on file   Social Drivers of Health   Tobacco Use: Low Risk (02/03/2024)   Patient History    Smoking Tobacco Use: Never    Smokeless Tobacco Use: Never    Passive Exposure: Not on file  Financial Resource Strain: Low Risk (02/03/2024)   Overall Financial Resource Strain (CARDIA)    Difficulty of Paying Living Expenses: Not very hard  Food Insecurity: Food Insecurity Present (02/03/2024)   Epic    Worried About Programme Researcher, Broadcasting/film/video in the Last Year: Never true    Ran Out of Food in the Last Year: Sometimes true  Transportation Needs: No Transportation Needs (02/03/2024)   Epic    Lack of Transportation (Medical): No    Lack of Transportation (Non-Medical): No  Physical Activity: Insufficiently Active (02/03/2024)   Exercise Vital Sign    Days of Exercise per Week: 1 day    Minutes of Exercise per Session: 10 min  Stress: Stress Concern Present (02/03/2024)   [employer] of Occupational Health - Occupational Stress Questionnaire    Feeling of Stress: To some extent  Social Connections: Socially Integrated (02/03/2024)   Social Connection and Isolation Panel    Frequency of Communication with Friends and Family: More than three times a week    Frequency of Social Gatherings with Friends and Family: Twice a week    Attends Religious Services: More than 4  times per year    Active Member of Clubs or Organizations: Yes    Attends Banker Meetings: More than 4 times per year    Marital Status: Married  Catering Manager Violence: Not At Risk (12/18/2022)   Humiliation, Afraid, Rape, and Kick questionnaire    Fear of Current or Ex-Partner: No    Emotionally Abused: No    Physically Abused: No    Sexually Abused: No  Depression (PHQ2-9): High Risk (02/03/2024)   Depression (PHQ2-9)    PHQ-2 Score: 14  Alcohol Screen: Low Risk (02/03/2024)   Alcohol Screen    Last Alcohol Screening Score (AUDIT): 1  Housing: Low Risk (02/03/2024)   Epic     Unable to Pay for Housing in the Last Year: No    Number of Times Moved in the Last Year: 0    Homeless in the Last Year: No  Utilities: Not At Risk (12/18/2022)   [facility] Utilities    Threatened with loss of utilities: No  Health Literacy: Adequate Health Literacy (12/18/2022)   B1300 Health Literacy    Frequency of need for help with medical instructions: Never   Social History   Tobacco Use  Smoking Status Never  Smokeless Tobacco Never   Social History   Substance and Sexual Activity  Alcohol Use Yes   Alcohol/week: 1.0 standard drink of alcohol   Types: 1 Glasses of wine per week   Comment: rare wine    Family History:  Family History  Problem Relation Age of Onset   Multiple myeloma Mother    Multiple sclerosis Mother    Cancer Mother    Depression Mother    Stroke Mother    Asthma Father    Depression Father    Anxiety disorder Father    Obesity Sister    Anxiety disorder Daughter    Multiple sclerosis Maternal Aunt     Past medical history, surgical history, medications, allergies, family history and social history reviewed with patient today and changes made to appropriate areas of the chart.   Review of Systems - negative All other ROS negative except what is listed above and in the HPI.      Objective:    BP 116/70 (BP Location: Left Arm, Patient Position: Sitting, Cuff Size: Large)   Pulse 98 Comment: apical  Temp 98.1 F (36.7 C) (Oral)   Resp 17   Ht 5' 4.17 (1.63 m)   Wt 225 lb 3.2 oz (102.2 kg)   SpO2 97%   BMI 38.45 kg/m   Wt Readings from Last 3 Encounters:  02/03/24 225 lb 3.2 oz (102.2 kg)  01/06/24 223 lb (101.2 kg)  12/01/23 231 lb (104.8 kg)    Physical Exam Vitals and nursing note reviewed. Exam conducted with a chaperone present.  Constitutional:      General: She is awake. She is not in acute distress.    Appearance: She is well-developed and well-groomed. She is obese. She is not ill-appearing or toxic-appearing.  HENT:      Head: Normocephalic and atraumatic.     Right Ear: Hearing, tympanic membrane, ear canal and external ear normal. No drainage.     Left Ear: Hearing, tympanic membrane, ear canal and external ear normal. No drainage.     Nose: Nose normal.     Right Sinus: No maxillary sinus tenderness or frontal sinus tenderness.     Left Sinus: No maxillary sinus tenderness or frontal sinus tenderness.     Mouth/Throat:  Mouth: Mucous membranes are moist.     Pharynx: Oropharynx is clear. Uvula midline. No pharyngeal swelling, oropharyngeal exudate or posterior oropharyngeal erythema.  Eyes:     General: Lids are normal.        Right eye: No discharge.        Left eye: No discharge.     Extraocular Movements: Extraocular movements intact.     Conjunctiva/sclera: Conjunctivae normal.     Pupils: Pupils are equal, round, and reactive to light.     Visual Fields: Right eye visual fields normal and left eye visual fields normal.  Neck:     Thyroid : No thyromegaly.     Vascular: No carotid bruit.     Trachea: Trachea normal.  Cardiovascular:     Rate and Rhythm: Normal rate and regular rhythm.     Heart sounds: Normal heart sounds. No murmur heard.    No gallop.  Pulmonary:     Effort: Pulmonary effort is normal. No accessory muscle usage or respiratory distress.     Breath sounds: Normal breath sounds.  Chest:  Breasts:    Right: Normal.     Left: Normal.  Abdominal:     General: Bowel sounds are normal.     Palpations: Abdomen is soft. There is no hepatomegaly or splenomegaly.     Tenderness: There is no abdominal tenderness.  Musculoskeletal:        General: Normal range of motion.     Cervical back: Normal range of motion and neck supple.     Right lower leg: No edema.     Left lower leg: No edema.  Lymphadenopathy:     Head:     Right side of head: No submental, submandibular, tonsillar, preauricular or posterior auricular adenopathy.     Left side of head: No submental,  submandibular, tonsillar, preauricular or posterior auricular adenopathy.     Cervical: No cervical adenopathy.     Upper Body:     Right upper body: No supraclavicular, axillary or pectoral adenopathy.     Left upper body: No supraclavicular, axillary or pectoral adenopathy.  Skin:    General: Skin is warm and dry.     Capillary Refill: Capillary refill takes less than 2 seconds.     Findings: No rash.  Neurological:     Mental Status: She is alert and oriented to person, place, and time.     Gait: Gait is intact.     Deep Tendon Reflexes: Reflexes are normal and symmetric.     Reflex Scores:      Brachioradialis reflexes are 2+ on the right side and 2+ on the left side.      Patellar reflexes are 2+ on the right side and 2+ on the left side. Psychiatric:        Attention and Perception: Attention normal.        Mood and Affect: Mood normal.        Speech: Speech normal.        Behavior: Behavior normal. Behavior is cooperative.        Thought Content: Thought content normal.        Judgment: Judgment normal.    Results for orders placed or performed in visit on 10/27/23  Hemoglobin A1c   Collection Time: 10/27/23  9:20 AM  Result Value Ref Range   Hgb A1c MFr Bld 5.8 (H) 4.8 - 5.6 %   Est. average glucose Bld gHb Est-mCnc 120 mg/dL  Insulin , random  Collection Time: 10/27/23  9:20 AM  Result Value Ref Range   INSULIN  23.7 2.6 - 24.9 uIU/mL  Iron and TIBC   Collection Time: 10/27/23  9:20 AM  Result Value Ref Range   Total Iron Binding Capacity 473 (H) 250 - 450 ug/dL   UIBC 569 (H) 868 - 574 ug/dL   Iron 43 27 - 840 ug/dL   Iron Saturation 9 (LL) 15 - 55 %  Ferritin   Collection Time: 10/27/23  9:20 AM  Result Value Ref Range   Ferritin 31 15 - 150 ng/mL  CMP14+EGFR   Collection Time: 10/27/23  9:20 AM  Result Value Ref Range   Glucose 82 70 - 99 mg/dL   BUN 12 6 - 24 mg/dL   Creatinine, Ser 9.12 0.57 - 1.00 mg/dL   eGFR 84 >40 fO/fpw/8.26   BUN/Creatinine  Ratio 14 9 - 23   Sodium 141 134 - 144 mmol/L   Potassium 4.1 3.5 - 5.2 mmol/L   Chloride 101 96 - 106 mmol/L   CO2 22 20 - 29 mmol/L   Calcium 9.6 8.7 - 10.2 mg/dL   Total Protein 7.0 6.0 - 8.5 g/dL   Albumin 4.6 3.9 - 4.9 g/dL   Globulin, Total 2.4 1.5 - 4.5 g/dL   Bilirubin Total 0.3 0.0 - 1.2 mg/dL   Alkaline Phosphatase 141 (H) 41 - 116 IU/L   AST 15 0 - 40 IU/L   ALT 22 0 - 32 IU/L  Magnesium    Collection Time: 10/27/23  9:20 AM  Result Value Ref Range   Magnesium  2.3 1.6 - 2.3 mg/dL  Lipid Panel With LDL/HDL Ratio   Collection Time: 10/27/23  9:20 AM  Result Value Ref Range   Cholesterol, Total 226 (H) 100 - 199 mg/dL   Triglycerides 858 0 - 149 mg/dL   HDL 63 >60 mg/dL   VLDL Cholesterol Cal 25 5 - 40 mg/dL   LDL Chol Calc (NIH) 861 (H) 0 - 99 mg/dL   LDL/HDL Ratio 2.2 0.0 - 3.2 ratio  Vitamin B12   Collection Time: 10/27/23  9:20 AM  Result Value Ref Range   Vitamin B-12 409 232 - 1,245 pg/mL  VITAMIN D  25 Hydroxy (Vit-D Deficiency, Fractures)   Collection Time: 10/27/23  9:20 AM  Result Value Ref Range   Vit D, 25-Hydroxy 46.8 30.0 - 100.0 ng/mL  TSH   Collection Time: 10/27/23  9:20 AM  Result Value Ref Range   TSH 1.840 0.450 - 4.500 uIU/mL  CBC with Differential/Platelet   Collection Time: 10/27/23  9:20 AM  Result Value Ref Range   WBC 8.6 3.4 - 10.8 x10E3/uL   RBC 5.17 3.77 - 5.28 x10E6/uL   Hemoglobin 13.4 11.1 - 15.9 g/dL   Hematocrit 58.6 65.9 - 46.6 %   MCV 80 79 - 97 fL   MCH 25.9 (L) 26.6 - 33.0 pg   MCHC 32.4 31.5 - 35.7 g/dL   RDW 84.8 88.2 - 84.5 %   Platelets 419 150 - 450 x10E3/uL   Neutrophils 75 Not Estab. %   Lymphs 16 Not Estab. %   Monocytes 6 Not Estab. %   Eos 3 Not Estab. %   Basos 0 Not Estab. %   Neutrophils Absolute 6.4 1.4 - 7.0 x10E3/uL   Lymphocytes Absolute 1.4 0.7 - 3.1 x10E3/uL   Monocytes Absolute 0.5 0.1 - 0.9 x10E3/uL   EOS (ABSOLUTE) 0.2 0.0 - 0.4 x10E3/uL   Basophils Absolute 0.0 0.0 - 0.2 x10E3/uL  Immature  Granulocytes 0 Not Estab. %   Immature Grans (Abs) 0.0 0.0 - 0.1 x10E3/uL      Assessment & Plan:   Problem List Items Addressed This Visit       Cardiovascular and Mediastinum   Migraine without aura and without status migrainosus, not intractable   Chronic, ongoing. Remains off Topamax .  Continue collaboration with neurology and recent notes reviewed. Recommend she take her allergy medications regularly to help with current triggers.        Nervous and Auditory   Relapsing remitting multiple sclerosis - Primary   Chronic, ongoing -- with infusions.  Continue to collaborate with neurology and current treatment. Recent notes reviewed.      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  Denies SI/HI.  Followed by psychiatry in past, new referral placed today to get established with [facility]. Will continue Wellbutrin  XL 300 MG daily and maintain Zoloft  at current dose.  Continue Trazodone  for sleep and adjust as needed. Discussed with her that [facility] may be able to assist her at getting back into therapy, which would be beneficial for her.  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 Suspect Adderall  and Wellbutrin  are causing some elevations in HR, will defer to psychiatry any changes but suspect would benefit from discontinuation of Wellbutrin  and possibly change in ADHD medication.      Relevant Orders   Ambulatory referral to Psychiatry     Other   Obesity   BMI 38.45, with some loss present. Continue to collaborate with weight management, labs up to date with them. Recommended eating smaller high protein, low fat meals more frequently and exercising 30 mins a day 5 times a week with a goal of 10-15lb weight loss in the next 3 months. Patient voiced their understanding and motivation to adhere to these recommendations.       Insomnia due to mental disorder   Chronic, ongoing  secondary to MS.  Continue Trazodone  and collaboration with neurology. She is working on getting back into therapy, which would be beneficial. Could consider sleep study in future.       GAD (generalized anxiety disorder)   Refer to depression plan of care.      Relevant Orders   Ambulatory referral to Psychiatry   Elevated low density lipoprotein (LDL) cholesterol level   Noted on past labs with ASCVD 0.7%, continue to monitor and diet focus with focus on modest weight loss.  Lipid panel and CMP up to date.      Attention deficit disorder   Chronic, ongoing, followed by neurology, continue current medication regimen and collaboration with them. Will defer changes to them and psychiatry. May benefit discontinuation in Wellbutrin  in future and change from Adderall  to Vyvanse or alternate regimen due to poor control and elevated HR presenting at times.      Relevant Orders   Ambulatory referral to Psychiatry   Other Visit Diagnoses       Encounter for annual physical exam       Annual physical exam today, health maintenance reviewed.         Follow up plan: Return in about 6 months (around 08/03/2024) for MS, MIGRAINES, DEPRESSION, ADHD.   LABORATORY TESTING:  - Pap smear: Up To Date  IMMUNIZATIONS:   - Tdap: Tetanus vaccination status reviewed: not to get due to MS. - Influenza: Refuses - Pneumovax: Not applicable - Prevnar: Not applicable -  HPV: Not applicable - Zostavax vaccine: Not applicable  SCREENING: -Mammogram: Ordered today -- family history -- aunt and maternal great grandmother -- she plans on schedule - Colonoscopy: Not applicable  - Bone Density: Not applicable  -Hearing Test: Not applicable  -Spirometry: Not applicable   PATIENT COUNSELING:   Advised to take 1 mg of folate supplement per day if capable of pregnancy.   Sexuality: Discussed sexually transmitted diseases, partner selection, use of condoms, avoidance of unintended pregnancy  and  contraceptive alternatives.   Advised to avoid cigarette smoking.  I discussed with the patient that most people either abstain from alcohol or drink within safe limits (<=14/week and <=4 drinks/occasion for males, <=7/weeks and <= 3 drinks/occasion for females) and that the risk for alcohol disorders and other health effects rises proportionally with the number of drinks per week and how often a drinker exceeds daily limits.  Discussed cessation/primary prevention of drug use and availability of treatment for abuse.   Diet: Encouraged to adjust caloric intake to maintain  or achieve ideal body weight, to reduce intake of dietary saturated fat and total fat, to limit sodium intake by avoiding high sodium foods and not adding table salt, and to maintain adequate dietary potassium and calcium preferably from fresh fruits, vegetables, and low-fat dairy products.    Stressed the importance of regular exercise  Injury prevention: Discussed safety belts, safety helmets, smoke detector, smoking near bedding or upholstery.   Dental health: Discussed importance of regular tooth brushing, flossing, and dental visits.    NEXT PREVENTATIVE PHYSICAL DUE IN 1 YEAR. Return in about 6 months (around 08/03/2024) for MS, MIGRAINES, DEPRESSION, ADHD.           "

## 2024-02-03 NOTE — Assessment & Plan Note (Signed)
 BMI 38.45, with some loss present. Continue to collaborate with weight management, labs up to date with them. Recommended eating smaller high protein, low fat meals more frequently and exercising 30 mins a day 5 times a week with a goal of 10-15lb weight loss in the next 3 months. Patient voiced their understanding and motivation to adhere to these recommendations.

## 2024-02-03 NOTE — Assessment & Plan Note (Signed)
 Chronic, ongoing -- with infusions.  Continue to collaborate with neurology and current treatment. Recent notes reviewed.

## 2024-02-03 NOTE — Assessment & Plan Note (Signed)
 Chronic, ongoing. Remains off Topamax .  Continue collaboration with neurology and recent notes reviewed. Recommend she take her allergy medications regularly to help with current triggers.

## 2024-02-03 NOTE — Assessment & Plan Note (Signed)
 Noted on past labs with ASCVD 0.7%, continue to monitor and diet focus with focus on modest weight loss.  Lipid panel and CMP up to date.

## 2024-02-03 NOTE — Assessment & Plan Note (Signed)
 Chronic, ongoing, followed by neurology, continue current medication regimen and collaboration with them. Will defer changes to them and psychiatry. May benefit discontinuation in Wellbutrin  in future and change from Adderall  to Vyvanse or alternate regimen due to poor control and elevated HR presenting at times.

## 2024-02-04 ENCOUNTER — Encounter: Admitting: Nurse Practitioner

## 2024-02-10 ENCOUNTER — Encounter (INDEPENDENT_AMBULATORY_CARE_PROVIDER_SITE_OTHER): Payer: Self-pay | Admitting: Internal Medicine

## 2024-02-10 ENCOUNTER — Ambulatory Visit (INDEPENDENT_AMBULATORY_CARE_PROVIDER_SITE_OTHER): Admitting: Internal Medicine

## 2024-02-10 VITALS — BP 112/75 | HR 102 | Temp 98.0°F | Ht 63.0 in | Wt 221.0 lb

## 2024-02-10 DIAGNOSIS — R000 Tachycardia, unspecified: Secondary | ICD-10-CM

## 2024-02-10 DIAGNOSIS — Z6838 Body mass index (BMI) 38.0-38.9, adult: Secondary | ICD-10-CM | POA: Diagnosis not present

## 2024-02-10 DIAGNOSIS — R7303 Prediabetes: Secondary | ICD-10-CM | POA: Diagnosis not present

## 2024-02-10 DIAGNOSIS — E66812 Obesity, class 2: Secondary | ICD-10-CM | POA: Diagnosis not present

## 2024-02-10 DIAGNOSIS — E6609 Other obesity due to excess calories: Secondary | ICD-10-CM

## 2024-02-10 NOTE — Progress Notes (Signed)
 "  Office: [phone]  /  Fax: [phone]  Weight Summary and Body Composition Analysis (BIA)  Vitals Temp: 98 F (36.7 C) BP: 112/75 Pulse Rate: (!) 102 SpO2: 100 %   Anthropometric Measurements Height: 5' 3 (1.6 m) Weight: 221 lb (100.2 kg) BMI (Calculated): 39.16 Weight at Last Visit: 221 lb Weight Lost Since Last Visit: 10 lb Weight Gained Since Last Visit: 0 lb Starting Weight: 232 lb Total Weight Loss (lbs): 9 lb (4.082 kg) Peak Weight: 233 lb   Body Composition  Body Fat %: 47.6 % Fat Mass (lbs): 105.6 lbs Muscle Mass (lbs): 110.4 lbs Total Body Water (lbs): 79.2 lbs Visceral Fat Rating : 13    RMR: 1800  Today's Visit #: 5  Starting Date: 10/27/23   Subjective   Chief Complaint: Obesity  Interval History  Discussed the use of AI scribe software for clinical note transcription with the patient, who gave verbal consent to proceed.  History of Present Illness [name] is a 44 year old female presenting for medical weight management.  [name] has successfully maintained her weight loss through the holidays and is working on improving her meal planning and preparation, which [name] finds challenging. [name] has increased her water intake, limiting herself to one soda a day after consuming at least two bottles of water. [name] eats breakfast daily, often an energy bar containing 140 calories, and is trying to incorporate more roughage into her diet. [name] faces challenges with meal planning, especially for dinner, due to her late work hours. Her children, aged 8, 31, and 51, usually manage their own meals. [name] is considering prepackaged meals and meal replacements to simplify her meal planning and is making healthier choices when eating out, such as opting for grilled items and salads.  Her heart rate has been consistently near 100 at home. [name] recently had a yearly checkup and has an upcoming appointment with a new psychiatrist. [name] restarted  Wellbutrin  but has not noticed significant changes except for some improvement in energy and less fatigue. [name] is currently out of Wellbutrin  and needs to refill her prescription.  [name] has been taking Ocrevus for her multiple sclerosis and her neurologist has recommended switching to Briumvi, which is less expensive. Her MS treatment typically helps her meet her insurance deductible early in the year.     Challenges affecting patient progress: orthopedic problems, medical conditions or chronic pain affecting mobility and medical comorbidities.    Pharmacotherapy for weight management: [name] is currently taking Metformin  (off label use for weight management and / or insulin  resistance and / or diabetes prevention) with adequate clinical response  and without side effects..   Assessment and Plan   Treatment Plan For Obesity:  Recommended Dietary Goals  [name] is currently in the action stage of change. As such, her goal is to continue weight management plan. [name] has agreed to: incorporate prepackaged healthy meals for convenience, incorporate 1-2 meal replacements a day for convenience , and continue current plan  Behavioral Health and Counseling  We discussed the following behavioral modification strategies today: work on meal planning and preparation, work on tracking and journaling calories using tracking application, continue to work on maintaining a reduced calorie state, getting the recommended amount of protein, incorporating whole foods, making healthy choices, staying well hydrated and practicing mindfulness when eating., and increase protein intake, fibrous foods (25 grams per day for women, 30 grams for men) and water to improve satiety and decrease hunger signals. .  Additional education  and resources provided today: None  Recommended Physical Activity Goals  [name] has been advised to work up to 150 minutes of moderate intensity aerobic activity a week and strengthening  exercises 2-3 times per week for cardiovascular health, weight loss maintenance and preservation of muscle mass.  [name] has agreed to :  Increase volume of physical activity to a goal of 240 minutes a week and Combine aerobic and strengthening exercises for efficiency and improved cardiometabolic health.  Medical Interventions and Pharmacotherapy  We discussed various medication options to help [name] with her weight loss efforts and we both agreed to : Adequate clinical response to anti-obesity medication, continue current anti-obesity regimen  Associated Conditions Impacted by Obesity Treatment  Assessment & Plan Class 2 obesity due to excess calories without serious comorbidity with body mass index (BMI) of 38.0 to 38.9 in adult Management is ongoing with a focus on lifestyle modifications. [name] has successfully maintained weight loss through the holidays. Challenges include meal planning and preparation, particularly for dinner. [name] has reduced soda intake by 50% and increased water consumption. Breakfast is consumed daily, often with an energy bar. [name] is exploring healthier snack options and considering meal replacements. Discussion on insurance coverage for Wegovy  and alternative medications was conducted. Emphasis on the importance of meal planning to avoid decision fatigue and unhealthy choices. - Continue current dietary modifications, including reduced soda intake and increased water consumption. - Explore healthier snack options and consider meal replacements for convenience. - Check insurance coverage for Wegovy  and alternative medications. - Consider using apps like My Fitness Pal, Lucet, and My Net Diary for tracking and journaling. - Explore meal planning resources such as Eat This Much and AI tools for easy meal prep ideas. - Consider prepackaged meals or frozen options for dinner convenience. Prediabetes Most recent A1c is  Lab Results  Component Value Date   HGBA1C 5.8 (H)  10/27/2023   HGBA1C 5.5 12/06/2020    Patient aware of disease state and risk of progression. This may contribute to abnormal cravings, fatigue and diabetic complications without having diabetes.   We have discussed treatment options which include: losing 7 to 10% of body weight, increasing physical activity to a goal of 150 minutes a week at moderate intensity.  Advised to maintain a diet low on simple and processed carbohydrates.  Continue metformin  twice a day for pharmacoprevention Tachycardia Elevated heart rate in the presence of amphetamines and Wellbutrin .  We discussed the risk associated with sustained tachycardia in regards to cardiac remodeling.  Advised to monitor heart rate using her Apple watch which [name] does not use regularly.  [name] will also follow-up with prescribing physicians to make medication adjustments if her heart rate is staying above the normal threshold.  [name] is to avoid energy drinks and caffeinated beverages          Objective   Physical Exam:  Blood pressure 112/75, pulse (!) 102, temperature 98 F (36.7 C), height 5' 3 (1.6 m), weight 221 lb (100.2 kg), SpO2 100%. Body mass index is 39.15 kg/m.  General: [name] is overweight, cooperative, alert, well developed, and in no acute distress. PSYCH: Has normal mood, affect and thought process.   HEENT: EOMI, sclerae are anicteric. Lungs: Normal breathing effort, no conversational dyspnea. Extremities: No edema.  Neurologic: No gross sensory or motor deficits. No tremors or fasciculations noted.    Diagnostic Data Reviewed:  BMET    Component Value Date/Time   NA 141 10/27/2023 0920   NA 141 07/28/2013 0410  K 4.1 10/27/2023 0920   K 3.7 07/28/2013 0410   CL 101 10/27/2023 0920   CL 103 07/28/2013 0410   CO2 22 10/27/2023 0920   CO2 29 07/28/2013 0410   GLUCOSE 82 10/27/2023 0920   GLUCOSE 93 07/26/2023 1502   GLUCOSE 121 (H) 07/28/2013 0410   BUN 12 10/27/2023 0920   BUN 16 07/28/2013 0410    CREATININE 0.87 10/27/2023 0920   CREATININE 0.66 07/28/2013 0410   CALCIUM 9.6 10/27/2023 0920   CALCIUM 8.6 07/28/2013 0410   GFRNONAA >60 07/26/2023 1502   GFRNONAA >60 07/28/2013 0410   GFRAA 108 12/06/2019 0955   GFRAA >60 07/28/2013 0410   Lab Results  Component Value Date   HGBA1C 5.8 (H) 10/27/2023   HGBA1C 5.5 12/06/2020   Lab Results  Component Value Date   INSULIN  23.7 10/27/2023   Lab Results  Component Value Date   TSH 1.840 10/27/2023   CBC    Component Value Date/Time   WBC 8.6 10/27/2023 0920   WBC 11.3 (H) 07/26/2023 1502   RBC 5.17 10/27/2023 0920   RBC 5.37 (H) 07/26/2023 1502   HGB 13.4 10/27/2023 0920   HCT 41.3 10/27/2023 0920   PLT 419 10/27/2023 0920   MCV 80 10/27/2023 0920   MCV 88 07/28/2013 0410   MCH 25.9 (L) 10/27/2023 0920   MCH 25.3 (L) 07/26/2023 1502   MCHC 32.4 10/27/2023 0920   MCHC 32.4 07/26/2023 1502   RDW 15.1 10/27/2023 0920   RDW 13.4 07/28/2013 0410   Iron Studies    Component Value Date/Time   IRON 43 10/27/2023 0920   TIBC 473 (H) 10/27/2023 0920   FERRITIN 31 10/27/2023 0920   IRONPCTSAT 9 (LL) 10/27/2023 0920   Lipid Panel     Component Value Date/Time   CHOL 226 (H) 10/27/2023 0920   TRIG 141 10/27/2023 0920   HDL 63 10/27/2023 0920   CHOLHDL 2.6 12/02/2017 1018   LDLCALC 138 (H) 10/27/2023 0920   Hepatic Function Panel     Component Value Date/Time   PROT 7.0 10/27/2023 0920   PROT 6.7 07/25/2013 0410   ALBUMIN 4.6 10/27/2023 0920   ALBUMIN 3.3 (L) 07/25/2013 0410   AST 15 10/27/2023 0920   AST 20 07/25/2013 0410   ALT 22 10/27/2023 0920   ALT 18 07/25/2013 0410   ALKPHOS 141 (H) 10/27/2023 0920   ALKPHOS 49 07/25/2013 0410   BILITOT 0.3 10/27/2023 0920   BILITOT 0.4 07/25/2013 0410   BILIDIR 0.10 02/18/2018 0911      Component Value Date/Time   TSH 1.840 10/27/2023 0920   Nutritional Lab Results  Component Value Date   VD25OH 46.8 10/27/2023   VD25OH 59.2 12/18/2022   VD25OH 56.3  06/18/2022    Medications: Outpatient Encounter Medications as of 02/10/2024  Medication Sig Note   albuterol  (VENTOLIN  HFA) 108 (90 Base) MCG/ACT inhaler Inhale 2 puffs into the lungs every 6 (six) hours as needed for wheezing or shortness of breath. 08/15/2023: Last time used was about a year ago pt stated 08/15/23   amphetamine -dextroamphetamine  (ADDERALL  XR) 30 MG 24 hr capsule Take 1 capsule (30 mg total) by mouth daily.    baclofen  (LIORESAL ) 10 MG tablet Take 1-3 tablets (10-30 mg total) by mouth See admin instructions. Take 10 mg in the morning and 30 mg at bedtime    buPROPion  (WELLBUTRIN  XL) 300 MG 24 hr tablet Take 1 tablet (300 mg total) by mouth daily.    Cholecalciferol 125  MCG (5000 UT) TABS Take 5,000 Units by mouth daily.    ferrous sulfate  325 (65 FE) MG tablet Take 1 tablet (325 mg total) by mouth every other day.    fluticasone  (FLONASE ) 50 MCG/ACT nasal spray Place 1 spray into both nostrils daily as needed for allergies or rhinitis.    meclizine  (ANTIVERT ) 25 MG tablet Take 1 tablet (25 mg total) by mouth 3 (three) times daily as needed for dizziness.    metFORMIN  (GLUCOPHAGE -XR) 500 MG 24 hr tablet Take 1 tablet (500 mg total) by mouth 2 (two) times daily with a meal.    ocrelizumab 600 mg in sodium chloride  0.9 % 500 mL Inject 600 mg into the vein every 6 (six) months. 08/15/2023: Last time taken was on 04/04/23 pt stated 08/15/23   propranolol  (INDERAL ) 10 MG tablet Take 1 tablet (10 mg total) by mouth daily as needed. For severe anxiety attack    sertraline  (ZOLOFT ) 100 MG tablet TAKE TWO TABLETS BY MOUTH ONCE DAILY    traZODone  (DESYREL ) 100 MG tablet Take 1 tablet (100 mg total) by mouth at bedtime.    No facility-administered encounter medications on file as of 02/10/2024.     Follow-Up   Return in about 4 weeks (around 03/09/2024) for For Weight Mangement with Dr. [email or web address] [name] was informed of the importance of frequent follow up visits to maximize her success with  intensive lifestyle modifications for her multiple health conditions.  Attestation Statement   Reviewed by clinician on day of visit: allergies, medications, problem list, medical history, surgical history, family history, social history, and previous encounter notes.     Lucas Francyne, MD  "

## 2024-02-10 NOTE — Assessment & Plan Note (Signed)
 Elevated heart rate in the presence of amphetamines and Wellbutrin .  We discussed the risk associated with sustained tachycardia in regards to cardiac remodeling.  Advised to monitor heart rate using her Apple watch which she does not use regularly.  She will also follow-up with prescribing physicians to make medication adjustments if her heart rate is staying above the normal threshold.  She is to avoid energy drinks and caffeinated beverages

## 2024-02-10 NOTE — Assessment & Plan Note (Signed)
 Management is ongoing with a focus on lifestyle modifications. She has successfully maintained weight loss through the holidays. Challenges include meal planning and preparation, particularly for dinner. She has reduced soda intake by 50% and increased water consumption. Breakfast is consumed daily, often with an energy bar. She is exploring healthier snack options and considering meal replacements. Discussion on insurance coverage for Wegovy  and alternative medications was conducted. Emphasis on the importance of meal planning to avoid decision fatigue and unhealthy choices. - Continue current dietary modifications, including reduced soda intake and increased water consumption. - Explore healthier snack options and consider meal replacements for convenience. - Check insurance coverage for Wegovy  and alternative medications. - Consider using apps like My Fitness Pal, Lucet, and My Net Diary for tracking and journaling. - Explore meal planning resources such as Eat This Much and AI tools for easy meal prep ideas. - Consider prepackaged meals or frozen options for dinner convenience.

## 2024-02-10 NOTE — Assessment & Plan Note (Signed)
 Most recent A1c is  Lab Results  Component Value Date   HGBA1C 5.8 (H) 10/27/2023   HGBA1C 5.5 12/06/2020    Patient aware of disease state and risk of progression. This may contribute to abnormal cravings, fatigue and diabetic complications without having diabetes.   We have discussed treatment options which include: losing 7 to 10% of body weight, increasing physical activity to a goal of 150 minutes a week at moderate intensity.  Advised to maintain a diet low on simple and processed carbohydrates.  Continue metformin  twice a day for pharmacoprevention

## 2024-02-11 ENCOUNTER — Other Ambulatory Visit (INDEPENDENT_AMBULATORY_CARE_PROVIDER_SITE_OTHER): Payer: Self-pay | Admitting: Internal Medicine

## 2024-02-11 DIAGNOSIS — R7303 Prediabetes: Secondary | ICD-10-CM

## 2024-02-11 DIAGNOSIS — T50905A Adverse effect of unspecified drugs, medicaments and biological substances, initial encounter: Secondary | ICD-10-CM

## 2024-02-11 DIAGNOSIS — E66813 Body mass index (BMI) 40.0-44.9, adult: Secondary | ICD-10-CM

## 2024-02-20 NOTE — Telephone Encounter (Signed)
 I have reached out to the infusion suite to make sure this case is ready for an infusion in 2026.

## 2024-02-24 ENCOUNTER — Ambulatory Visit (INDEPENDENT_AMBULATORY_CARE_PROVIDER_SITE_OTHER): Admitting: Neurology

## 2024-02-24 ENCOUNTER — Encounter: Payer: Self-pay | Admitting: Neurology

## 2024-02-24 VITALS — BP 105/74 | HR 95 | Ht 63.0 in | Wt 224.4 lb

## 2024-02-24 DIAGNOSIS — M25562 Pain in left knee: Secondary | ICD-10-CM | POA: Diagnosis not present

## 2024-02-24 DIAGNOSIS — T8090XA Unspecified complication following infusion and therapeutic injection, initial encounter: Secondary | ICD-10-CM

## 2024-02-24 DIAGNOSIS — G8929 Other chronic pain: Secondary | ICD-10-CM | POA: Diagnosis not present

## 2024-02-24 DIAGNOSIS — F418 Other specified anxiety disorders: Secondary | ICD-10-CM | POA: Diagnosis not present

## 2024-02-24 DIAGNOSIS — G43009 Migraine without aura, not intractable, without status migrainosus: Secondary | ICD-10-CM

## 2024-02-24 DIAGNOSIS — Z79899 Other long term (current) drug therapy: Secondary | ICD-10-CM | POA: Diagnosis not present

## 2024-02-24 DIAGNOSIS — G35A Relapsing-remitting multiple sclerosis: Secondary | ICD-10-CM

## 2024-02-24 DIAGNOSIS — R269 Unspecified abnormalities of gait and mobility: Secondary | ICD-10-CM

## 2024-02-24 NOTE — Progress Notes (Signed)
 "  [facility]  PATIENT: NIAJAH Ballard DOB: 01-09-1981  REFERRING DOCTOR OR PCP:  Dr. Jannett Fairly  [facility] SOURCE: Patient, a couple MRI reports and images on PACS. Lab reports, notes from Dr. Loreli  _________________________________   HISTORICAL  CHIEF COMPLAINT:  Chief Complaint  Patient presents with   Follow-up    Room 16 Alone MS MIGRAINE      HISTORY OF PRESENT ILLNESS:  [name] is a 44 y.o. woman with relapsing remitting multiple sclerosis.     Update 02/24/2024: Her MS had done well while on Ocrevus.  Specifically she has had no exacerbations or new lesions on MRI.   MRI of the brain Jun 25 2022 had shown no new lesions.  She has reduced balance.   She has some some right leg weakness and spasticity.   She stumbles some and had one fall trippig on her dragging .  She has a right foot drop. She has never tried an AFO. She tires out easily.  She holds the rail going downstairs.    She also takes baclofen  (10 mg in am and 30 mg at night)  for spasticity with benefit.       Mood is doing well on Zoloft  200 mg and Wellbutrin  300 mg/d.   She is seeing a new psychiatrist.   She notes mild cognitive issues - especially more difficulty with verbal processing.   She  has some word finding errors and word substitution.     She plays piano and guitar and has some errors when she reads music.   Adderall  XR in the morning helps mental fog and attention and processing but causes tachycardia  She uses trazodone  to help insomnia but notes nocturia often awakens her.   PSG showed snoring but not significant OSA.  She had sparse N3 (4.9%)  Migraine headaches have been well on topiramate .   She had Covid last year and again last month. The second time was mild while the first time wiped her out x weeks.  She also has sinus issues and will be seeing ENT.   She has left knee pain that increases with walking.  Her right leg is more affected by  MS.   Metformin  was added for weight loss and she has lost 10 pounds.    MS history:   In late June or early July 2011, she had the onset of numbness that went up to her belly button. She presented to the emergency room. An MRI of the brain showed 1 periventricular focus with maybe a second subtle periventricular focus. An MRI of the cervical spine showed an enhancing lesion adjacent to C3-C4. The diagnosis of possible MS was made but the evidence was not enough to begin a disease modifying therapy. In 2015, she had the onset of similar symptoms and also had decreased ability to use the left hand. An MRI of the brain at that time showed several new lesions not present in 2011, including a focus in the right middle cerebellar peduncle additionally, she had a lumbar puncture consistent with MS. Because of her symptoms, MRI changes and lumbar puncture results, she was diagnosed with clinical definite relapsing remitting MS and was started on Tecfidera. She has continued on Tecfidera. Over the past few months she has noted more difficulty with focus and concentration.     She also has had some change in vision more recently. Because of these newer symptoms, and MRI was repeated 04/27/2015. The MRI of the  cervical spine showed no new lesions. though the MRI of the brain showed 2  lesions not present in 2015, one in the left juxtacortical white matter and one in the left deep white matter.  She had a small exacerbation in August 2017 and received IV SOlu-Medrol .   MRI cervical spine 2017 an MRI of the brain show two spinal plaques, a definite rightt middle cerebellar peduncle plaque and a possible right pontine plaque and several foci in the hemispheres  MRI 01/2016 showed no new lesions.  She is JCV Ab positive.  She had an exacerbation in 2017 and early 2018 switch to ocrelizumab with her first doses in May 2018 (was on Gilenya ).   She had another small exacerbation in July (right leg stiff/weak) and she received 3  days of IV site.  She went from using a cane to a walker for balance.  She tolerates the ocrelizumab well.     FH:  Her mom has MS and her MS stabilized after a bone marrow transplant for cancer.  MRI images MRI of the brain 09/24/2017 showed T2 hyperintense foci in the right middle cerebellar peduncle and hemispheres.  There were no changes compared to 09/08/2017.  MRI of the cervical spine 09/26/2017 showed foci within the spinal cord adjacent to C4 and C6-C7.  They were both present on an MRI from 04/04/2016.  There is mild spinal stenosis at C5-C6 due to disc protrusion.  MRI of the brain 09/06/2020 showed  a focus within the left middle cerebellar peduncle and a couple small foci in the periventricular white matter.  None of these enhance or appear to be acute.  Compared to the MRI dated 09/24/2017, there are no new lesions.  These are consistent with chronic demyelinating plaque associated with multiple sclerosis   REVIEW OF SYSTEMS: Constitutional: No fevers, chills, sweats, or change in appetite.  She has fatigue and insomnia. Eyes: No visual changes, double vision, eye pain Ear, nose and throat: No hearing loss, ear pain, nasal congestion, sore throat Cardiovascular: No chest pain, palpitations Respiratory:  No shortness of breath at rest or with exertion.   No wheezes GastrointestinaI: No nausea, vomiting, diarrhea, abdominal pain, fecal incontinence Genitourinary:    She notes frequency, some hesitancy at times and nocturia. Musculoskeletal:  No neck pain, back pain Integumentary: No rash, pruritus, skin lesions Neurological: as above Psychiatric: as above Endocrine: No palpitations, diaphoresis, change in appetite, change in weigh or increased thirst Hematologic/Lymphatic:  No anemia, purpura, petechiae. Allergic/Immunologic: No itchy/runny eyes, nasal congestion, recent allergic reactions, rashes  ALLERGIES: Allergies  Allergen Reactions   Flexeril  [Cyclobenzaprine ] Other (See  Comments)    Drowsy when taking during the day     HOME MEDICATIONS:  Current Outpatient Medications:    albuterol  (VENTOLIN  HFA) 108 (90 Base) MCG/ACT inhaler, Inhale 2 puffs into the lungs every 6 (six) hours as needed for wheezing or shortness of breath., Disp: 6.7 g, Rfl: 0   amphetamine -dextroamphetamine  (ADDERALL  XR) 30 MG 24 hr capsule, Take 1 capsule (30 mg total) by mouth daily., Disp: 30 capsule, Rfl: 0   baclofen  (LIORESAL ) 10 MG tablet, Take 1-3 tablets (10-30 mg total) by mouth See admin instructions. Take 10 mg in the morning and 30 mg at bedtime, Disp: 360 tablet, Rfl: 1   buPROPion  (WELLBUTRIN  XL) 300 MG 24 hr tablet, Take 1 tablet (300 mg total) by mouth daily., Disp: 30 tablet, Rfl: 12   Cholecalciferol 125 MCG (5000 UT) TABS, Take 5,000 Units by mouth daily.,  Disp: , Rfl:    ferrous sulfate  325 (65 FE) MG tablet, Take 1 tablet (325 mg total) by mouth every other day., Disp: 60 tablet, Rfl: 0   fluticasone  (FLONASE ) 50 MCG/ACT nasal spray, Place 1 spray into both nostrils daily as needed for allergies or rhinitis., Disp: , Rfl:    meclizine  (ANTIVERT ) 25 MG tablet, Take 1 tablet (25 mg total) by mouth 3 (three) times daily as needed for dizziness., Disp: 30 tablet, Rfl: 0   metFORMIN  (GLUCOPHAGE -XR) 500 MG 24 hr tablet, TAKE ONE TABLET BY MOUTH TWICE DAILY WITH A MEAL, Disp: 60 tablet, Rfl: 0   ocrelizumab 600 mg in sodium chloride  0.9 % 500 mL, Inject 600 mg into the vein every 6 (six) months., Disp: , Rfl:    propranolol  (INDERAL ) 10 MG tablet, Take 1 tablet (10 mg total) by mouth daily as needed. For severe anxiety attack, Disp: 30 tablet, Rfl: 12   sertraline  (ZOLOFT ) 100 MG tablet, TAKE TWO TABLETS BY MOUTH ONCE DAILY, Disp: 180 tablet, Rfl: 0   traZODone  (DESYREL ) 100 MG tablet, Take 1 tablet (100 mg total) by mouth at bedtime., Disp: 30 tablet, Rfl: 12  PAST MEDICAL HISTORY: Past Medical History:  Diagnosis Date   ADD (attention deficit disorder)    Allergy     Anxiety    Back pain    Biliary colic    Depression    Edema of both lower extremities    Elevated LDL cholesterol level    GAD (generalized anxiety disorder)    GERD (gastroesophageal reflux disease)    Insomnia    Migraine    MS (multiple sclerosis)    Obesity    PONV (postoperative nausea and vomiting)    RBBB (right bundle branch block)    Rhinitis    Right foot drop    SOB (shortness of breath)    Spinal stenosis    Swallowing difficulty    Vitamin D  deficiency     PAST SURGICAL HISTORY: Past Surgical History:  Procedure Laterality Date   CHOLECYSTECTOMY     WISDOM TOOTH EXTRACTION      FAMILY HISTORY: Family History  Problem Relation Age of Onset   Multiple myeloma Mother    Multiple sclerosis Mother    Cancer Mother    Depression Mother    Stroke Mother    Asthma Father    Depression Father    Anxiety disorder Father    Obesity Sister    Anxiety disorder Daughter    Multiple sclerosis Maternal Aunt     SOCIAL HISTORY:  Social History   Socioeconomic History   Marital status: Married    Spouse name: corporate treasurer   Number of children: 3   Years of education: Not on file   Highest education level: Master's degree (e.g., MA, MS, MEng, MEd, MSW, MBA)  Occupational History   Occupation: Licensed Engineer, Petroleum and Music Therapist  Tobacco Use   Smoking status: Never   Smokeless tobacco: Never  Vaping Use   Vaping status: Never Used  Substance and Sexual Activity   Alcohol use: Yes    Alcohol/week: 1.0 standard drink of alcohol    Types: 1 Glasses of wine per week    Comment: rare wine   Drug use: Never   Sexual activity: Yes    Birth control/protection: I.U.D.  Other Topics Concern   Not on file  Social History Narrative   Not on file   Social Drivers of Health  Tobacco Use: Low Risk (02/24/2024)   Patient History    Smoking Tobacco Use: Never    Smokeless Tobacco Use: Never    Passive Exposure: Not on  file  Financial Resource Strain: Low Risk (02/03/2024)   Overall Financial Resource Strain (CARDIA)    Difficulty of Paying Living Expenses: Not very hard  Food Insecurity: Food Insecurity Present (02/03/2024)   Epic    Worried About Programme Researcher, Broadcasting/film/video in the Last Year: Never true    Ran Out of Food in the Last Year: Sometimes true  Transportation Needs: No Transportation Needs (02/03/2024)   Epic    Lack of Transportation (Medical): No    Lack of Transportation (Non-Medical): No  Physical Activity: Insufficiently Active (02/03/2024)   Exercise Vital Sign    Days of Exercise per Week: 1 day    Minutes of Exercise per Session: 10 min  Stress: Stress Concern Present (02/03/2024)   [employer] of Occupational Health - Occupational Stress Questionnaire    Feeling of Stress: To some extent  Social Connections: Socially Integrated (02/03/2024)   Social Connection and Isolation Panel    Frequency of Communication with Friends and Family: More than three times a week    Frequency of Social Gatherings with Friends and Family: Twice a week    Attends Religious Services: More than 4 times per year    Active Member of [employer] or Organizations: Yes    Attends Banker Meetings: More than 4 times per year    Marital Status: Married  Catering Manager Violence: Not At Risk (12/18/2022)   Humiliation, Afraid, Rape, and Kick questionnaire    Fear of Current or Ex-Partner: No    Emotionally Abused: No    Physically Abused: No    Sexually Abused: No  Depression (PHQ2-9): High Risk (02/03/2024)   Depression (PHQ2-9)    PHQ-2 Score: 14  Alcohol Screen: Low Risk (02/03/2024)   Alcohol Screen    Last Alcohol Screening Score (AUDIT): 1  Housing: Low Risk (02/03/2024)   Epic    Unable to Pay for Housing in the Last Year: No    Number of Times Moved in the Last Year: 0    Homeless in the Last Year: No  Utilities: Not At Risk (12/18/2022)   AHC Utilities    Threatened with loss of  utilities: No  Health Literacy: Adequate Health Literacy (12/18/2022)   B1300 Health Literacy    Frequency of need for help with medical instructions: Never     PHYSICAL EXAM  Vitals:   02/24/24 1021  BP: 105/74  Pulse: 95  SpO2: 98%  Weight: 224 lb 6.4 oz (101.8 kg)  Height: 5' 3 (1.6 m)     Body mass index is 39.75 kg/m.   General: The patient is well-developed and well-nourished and in no acute distress    Musculoskeletal:    The neck is nontender with a good range of motion.  Neurologic Exam  Mental status: The patient is alert and oriented x 3 at the time of the examination. The patient has apparent normal recent and remote memory, with an apparently normal attention span and concentration ability.   Speech is normal.  Cranial nerves: Extraocular movements are full.  Facial strength and sensation is normal.  Trapezius strength is normal.  The tongue is midline, and the patient has symmetric elevation of the soft palate. No obvious hearing deficits are noted.  Motor:  Muscle bulk is normal.   Tone is mildly increased in  legs, fairly equally.  Strength is  5 / 5 in all 4 extremities except 4+/5 in the feet.   Sensory: Sensory testing shows reduced vibration in the right leg and allodynia to touch in right leg.    She has normal sensation in the arms.  Coordination: Cerebellar testing shows good finger-nose-finger but reduced heel-to-shin slightly worse on the right than left.     Gait and station: Station is normal.  She has a mild right foot drop and poor tandem gait.  Romberg is negative.  Reflexes: Deep tendon reflexes are symmetric and increased in her legs.  There are crossed adductor responses at the knees.  No ankle clonus.    DIAGNOSTIC DATA (LABS, IMAGING, TESTING) - I reviewed patient records, labs, notes, testing and imaging myself where available.  Lab Results  Component Value Date   WBC 8.6 10/27/2023   HGB 13.4 10/27/2023   HCT 41.3 10/27/2023    MCV 80 10/27/2023   PLT 419 10/27/2023      Component Value Date/Time   NA 141 10/27/2023 0920   NA 141 07/28/2013 0410   K 4.1 10/27/2023 0920   K 3.7 07/28/2013 0410   CL 101 10/27/2023 0920   CL 103 07/28/2013 0410   CO2 22 10/27/2023 0920   CO2 29 07/28/2013 0410   GLUCOSE 82 10/27/2023 0920   GLUCOSE 93 07/26/2023 1502   GLUCOSE 121 (H) 07/28/2013 0410   BUN 12 10/27/2023 0920   BUN 16 07/28/2013 0410   CREATININE 0.87 10/27/2023 0920   CREATININE 0.66 07/28/2013 0410   CALCIUM 9.6 10/27/2023 0920   CALCIUM 8.6 07/28/2013 0410   PROT 7.0 10/27/2023 0920   PROT 6.7 07/25/2013 0410   ALBUMIN 4.6 10/27/2023 0920   ALBUMIN 3.3 (L) 07/25/2013 0410   AST 15 10/27/2023 0920   AST 20 07/25/2013 0410   ALT 22 10/27/2023 0920   ALT 18 07/25/2013 0410   ALKPHOS 141 (H) 10/27/2023 0920   ALKPHOS 49 07/25/2013 0410   BILITOT 0.3 10/27/2023 0920   BILITOT 0.4 07/25/2013 0410   GFRNONAA >60 07/26/2023 1502   GFRNONAA >60 07/28/2013 0410   GFRAA 108 12/06/2019 0955   GFRAA >60 07/28/2013 0410        ASSESSMENT AND PLAN  Relapsing remitting multiple sclerosis - Plan: CBC with Differential/Platelet, IgG, IgA, IgM  Gait disturbance  High risk medication use - Plan: CBC with Differential/Platelet, IgG, IgA, IgM  Infusion reaction, initial encounter  Migraine without aura and without status migrainosus, not intractable  Depression with anxiety  Chronic pain of left knee   1.   She has had more infusion reactions over the last several Ocrevus infusions.  After the last reaction that was more severe we discussed switching to Briumvi.  Hopefully, the different mechanism (antibody dependent cellular cytotoxicity via natural killer cells compared to complement mediated cytotoxicity) will allow her to have the same level of efficacy much better tolerability.   2.  Continue other medications for mood, insomnia.   3.   Stay active and exercise as tolerated. 4.   Continue  Adderall  30 mg every morning for now but consider reduced dose if psychiatry starts another med for ADD rellated symptoms 5.   Return in 6 months or sooner if there are new or worsening neurologic symptoms.   Raissa Dam A. Vear, MD, [facility] 02/24/2024, 8:44 PM Certified in Neurology, Clinical Neurophysiology, Sleep Medicine and Neuroimaging  [facility] [address] [place], [place] [address] [phone])  [phone] olll "

## 2024-02-25 ENCOUNTER — Ambulatory Visit: Payer: Self-pay | Admitting: Neurology

## 2024-02-25 LAB — CBC WITH DIFFERENTIAL/PLATELET
Basophils Absolute: 0.1 x10E3/uL (ref 0.0–0.2)
Basos: 1 %
EOS (ABSOLUTE): 0.3 x10E3/uL (ref 0.0–0.4)
Eos: 4 %
Hematocrit: 45 % (ref 34.0–46.6)
Hemoglobin: 14.2 g/dL (ref 11.1–15.9)
Immature Grans (Abs): 0 x10E3/uL (ref 0.0–0.1)
Immature Granulocytes: 0 %
Lymphocytes Absolute: 1.4 x10E3/uL (ref 0.7–3.1)
Lymphs: 18 %
MCH: 25.4 pg — ABNORMAL LOW (ref 26.6–33.0)
MCHC: 31.6 g/dL (ref 31.5–35.7)
MCV: 81 fL (ref 79–97)
Monocytes Absolute: 0.5 x10E3/uL (ref 0.1–0.9)
Monocytes: 7 %
Neutrophils Absolute: 5.4 x10E3/uL (ref 1.4–7.0)
Neutrophils: 70 %
Platelets: 452 x10E3/uL — ABNORMAL HIGH (ref 150–450)
RBC: 5.58 x10E6/uL — ABNORMAL HIGH (ref 3.77–5.28)
RDW: 15.1 % (ref 11.7–15.4)
WBC: 7.7 x10E3/uL (ref 3.4–10.8)

## 2024-02-25 LAB — IGG, IGA, IGM
IgG (Immunoglobin G), Serum: 791 mg/dL (ref 586–1602)
IgM (Immunoglobulin M), Srm: 65 mg/dL (ref 26–217)
Immunoglobulin A, (IgA) QN, Serum: 79 mg/dL — ABNORMAL LOW (ref 87–352)

## 2024-03-04 ENCOUNTER — Telehealth: Payer: Self-pay | Admitting: *Deleted

## 2024-03-04 NOTE — Telephone Encounter (Signed)
" °  Orders given Kim in [facility]   "

## 2024-03-09 ENCOUNTER — Ambulatory Visit (INDEPENDENT_AMBULATORY_CARE_PROVIDER_SITE_OTHER): Admitting: Internal Medicine

## 2024-03-09 ENCOUNTER — Encounter (INDEPENDENT_AMBULATORY_CARE_PROVIDER_SITE_OTHER): Payer: Self-pay | Admitting: Internal Medicine

## 2024-03-09 DIAGNOSIS — R7303 Prediabetes: Secondary | ICD-10-CM

## 2024-03-09 DIAGNOSIS — E66813 Obesity, class 3: Secondary | ICD-10-CM

## 2024-03-09 DIAGNOSIS — R635 Abnormal weight gain: Secondary | ICD-10-CM

## 2024-03-09 DIAGNOSIS — Z6841 Body Mass Index (BMI) 40.0 and over, adult: Secondary | ICD-10-CM

## 2024-03-09 MED ORDER — METFORMIN HCL ER 500 MG PO TB24: 500.0000 mg | ORAL_TABLET | Freq: Two times a day (BID) | ORAL | 0 refills | Status: AC

## 2024-03-09 NOTE — Assessment & Plan Note (Signed)
 She is on metformin  for insulin  resistance and weight loss. She experiences upset stomach, especially when taking metformin  on an empty stomach. She is advised to take metformin  with food to minimize gastrointestinal side effects. She is currently taking metformin  twice daily but is advised to try taking one tablet with supper to see if it is tolerated. If tolerated, she can increase to two tablets with supper. Metformin  is a 24-hour drug, and the goal is to find the lowest dose that is tolerated. - Adjust metformin  to one tablet with supper, then increase to two tablets if tolerated. - Ensure metformin  is taken with food to minimize gastrointestinal side effects. - Sent 90-day supply of metformin  to [facility].

## 2024-03-09 NOTE — Assessment & Plan Note (Signed)
 She is on several obesogenic drugs.  She is currently on metformin  to offset some weight gain associated with medications and also for prediabetes.  She is having some side effects with twice a day dosing.

## 2024-03-09 NOTE — Assessment & Plan Note (Signed)
 Weight: decrease of 18 lb (7.7%) over 4 months, 2 weeks  Start: 10/20/2023 235 lb (106.6 kg)  End: 03/09/2024 217 lb (98.4 kg)  She has lost four pounds since the last visit, averaging one pound per week. She follows a 1200 calorie nutrition plan 50% of the time and exercises four days a week for five minutes. She is increasing daily activity gradually due to MS. 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 She is also increasing fruit intake and trying new salad mixes. She is considering using PB2 powder and Crofters jelly for healthier options. She is also exploring frozen meals and vegetable lasagnas for convenience. She is encouraged to build consistency in her lifestyle changes. - Continue 1200 calorie nutrition plan. - Encouraged consistent dietary habits and gradual increase in physical activity. - Encouraged use of healthy frozen meals and vegetable lasagnas for convenience. - Encouraged tracking steps and increasing physical activity throughout the day.

## 2024-03-11 NOTE — Telephone Encounter (Signed)
 I have reached out to [facility] for an update on this case.

## 2024-03-12 NOTE — Telephone Encounter (Signed)
 Per Mliss in [facility]: It is still pending at this time.

## 2024-04-13 ENCOUNTER — Ambulatory Visit (INDEPENDENT_AMBULATORY_CARE_PROVIDER_SITE_OTHER): Admitting: Internal Medicine

## 2024-08-03 ENCOUNTER — Ambulatory Visit: Admitting: Nurse Practitioner

## 2024-08-30 ENCOUNTER — Ambulatory Visit: Admitting: Neurology
